# Patient Record
Sex: Female | Born: 1935 | Race: White | Hispanic: No | Marital: Married | Smoking: Never smoker
Health system: Southern US, Community
[De-identification: ages and names within clinical notes are randomized; demographics above are authoritative.]

## PROBLEM LIST (undated history)

## (undated) DIAGNOSIS — K219 Gastro-esophageal reflux disease without esophagitis: Secondary | ICD-10-CM

## (undated) DIAGNOSIS — H409 Unspecified glaucoma: Secondary | ICD-10-CM

## (undated) DIAGNOSIS — I509 Heart failure, unspecified: Secondary | ICD-10-CM

## (undated) DIAGNOSIS — F0280 Dementia in other diseases classified elsewhere without behavioral disturbance: Secondary | ICD-10-CM

## (undated) DIAGNOSIS — J90 Pleural effusion, not elsewhere classified: Secondary | ICD-10-CM

## (undated) DIAGNOSIS — G309 Alzheimer's disease, unspecified: Secondary | ICD-10-CM

## (undated) DIAGNOSIS — E079 Disorder of thyroid, unspecified: Secondary | ICD-10-CM

## (undated) DIAGNOSIS — E876 Hypokalemia: Secondary | ICD-10-CM

## (undated) DIAGNOSIS — F0390 Unspecified dementia without behavioral disturbance: Secondary | ICD-10-CM

## (undated) DIAGNOSIS — I10 Essential (primary) hypertension: Secondary | ICD-10-CM

## (undated) DIAGNOSIS — I2510 Atherosclerotic heart disease of native coronary artery without angina pectoris: Secondary | ICD-10-CM

## (undated) HISTORY — DX: Essential (primary) hypertension: I10

## (undated) HISTORY — PX: THYROID CYST EXCISION: SHX2511

## (undated) HISTORY — DX: Disorder of thyroid, unspecified: E07.9

## (undated) HISTORY — PX: ABDOMINAL HYSTERECTOMY: SHX81

## (undated) HISTORY — DX: Unspecified glaucoma: H40.9

---

## 1997-11-08 ENCOUNTER — Other Ambulatory Visit: Admission: RE | Admit: 1997-11-08 | Discharge: 1997-11-08 | Payer: Self-pay | Admitting: Gynecology

## 1998-02-22 ENCOUNTER — Inpatient Hospital Stay (HOSPITAL_COMMUNITY): Admission: RE | Admit: 1998-02-22 | Discharge: 1998-02-24 | Payer: Self-pay | Admitting: Gynecology

## 1998-11-08 ENCOUNTER — Other Ambulatory Visit: Admission: RE | Admit: 1998-11-08 | Discharge: 1998-11-08 | Payer: Self-pay | Admitting: Gynecology

## 2000-11-12 ENCOUNTER — Other Ambulatory Visit: Admission: RE | Admit: 2000-11-12 | Discharge: 2000-11-12 | Payer: Self-pay | Admitting: Gynecology

## 2000-11-27 ENCOUNTER — Encounter: Payer: Self-pay | Admitting: General Surgery

## 2000-11-27 ENCOUNTER — Encounter: Admission: RE | Admit: 2000-11-27 | Discharge: 2000-11-27 | Payer: Self-pay | Admitting: General Surgery

## 2001-12-02 ENCOUNTER — Other Ambulatory Visit: Admission: RE | Admit: 2001-12-02 | Discharge: 2001-12-02 | Payer: Self-pay | Admitting: Gynecology

## 2001-12-02 ENCOUNTER — Encounter: Admission: RE | Admit: 2001-12-02 | Discharge: 2001-12-02 | Payer: Self-pay | Admitting: Gynecology

## 2001-12-02 ENCOUNTER — Encounter: Payer: Self-pay | Admitting: Gynecology

## 2002-03-09 ENCOUNTER — Encounter (INDEPENDENT_AMBULATORY_CARE_PROVIDER_SITE_OTHER): Payer: Self-pay | Admitting: *Deleted

## 2002-03-09 ENCOUNTER — Ambulatory Visit (HOSPITAL_COMMUNITY): Admission: RE | Admit: 2002-03-09 | Discharge: 2002-03-09 | Payer: Self-pay | Admitting: Gastroenterology

## 2002-12-08 ENCOUNTER — Encounter: Admission: RE | Admit: 2002-12-08 | Discharge: 2002-12-08 | Payer: Self-pay | Admitting: Gynecology

## 2002-12-08 ENCOUNTER — Encounter: Payer: Self-pay | Admitting: Gynecology

## 2003-12-19 ENCOUNTER — Other Ambulatory Visit: Admission: RE | Admit: 2003-12-19 | Discharge: 2003-12-19 | Payer: Self-pay | Admitting: Gynecology

## 2003-12-19 ENCOUNTER — Encounter: Admission: RE | Admit: 2003-12-19 | Discharge: 2003-12-19 | Payer: Self-pay | Admitting: Gynecology

## 2004-12-19 ENCOUNTER — Encounter: Admission: RE | Admit: 2004-12-19 | Discharge: 2004-12-19 | Payer: Self-pay | Admitting: Gynecology

## 2005-12-25 ENCOUNTER — Encounter: Admission: RE | Admit: 2005-12-25 | Discharge: 2005-12-25 | Payer: Self-pay | Admitting: Gynecology

## 2005-12-25 ENCOUNTER — Other Ambulatory Visit: Admission: RE | Admit: 2005-12-25 | Discharge: 2005-12-25 | Payer: Self-pay | Admitting: Gynecology

## 2007-04-15 ENCOUNTER — Encounter: Admission: RE | Admit: 2007-04-15 | Discharge: 2007-04-15 | Payer: Self-pay | Admitting: Gynecology

## 2007-05-02 ENCOUNTER — Emergency Department (HOSPITAL_COMMUNITY): Admission: EM | Admit: 2007-05-02 | Discharge: 2007-05-02 | Payer: Self-pay | Admitting: Emergency Medicine

## 2008-05-12 ENCOUNTER — Encounter: Admission: RE | Admit: 2008-05-12 | Discharge: 2008-05-12 | Payer: Self-pay | Admitting: Gynecology

## 2009-05-29 ENCOUNTER — Encounter: Admission: RE | Admit: 2009-05-29 | Discharge: 2009-05-29 | Payer: Self-pay | Admitting: Gynecology

## 2009-12-08 ENCOUNTER — Emergency Department (HOSPITAL_COMMUNITY): Admission: EM | Admit: 2009-12-08 | Discharge: 2009-12-09 | Payer: Self-pay | Admitting: Emergency Medicine

## 2009-12-09 ENCOUNTER — Inpatient Hospital Stay (HOSPITAL_COMMUNITY): Admission: EM | Admit: 2009-12-09 | Discharge: 2009-12-18 | Payer: Self-pay | Admitting: Emergency Medicine

## 2009-12-14 ENCOUNTER — Encounter (INDEPENDENT_AMBULATORY_CARE_PROVIDER_SITE_OTHER): Payer: Self-pay | Admitting: Internal Medicine

## 2010-06-07 LAB — CBC
HCT: 34 % — ABNORMAL LOW (ref 36.0–46.0)
HCT: 36.5 % (ref 36.0–46.0)
HCT: 42.5 % (ref 36.0–46.0)
HCT: 43.3 % (ref 36.0–46.0)
Hemoglobin: 11.6 g/dL — ABNORMAL LOW (ref 12.0–15.0)
Hemoglobin: 11.7 g/dL — ABNORMAL LOW (ref 12.0–15.0)
Hemoglobin: 12.5 g/dL (ref 12.0–15.0)
MCH: 32.5 pg (ref 26.0–34.0)
MCH: 32.6 pg (ref 26.0–34.0)
MCH: 32.7 pg (ref 26.0–34.0)
MCH: 32.8 pg (ref 26.0–34.0)
MCHC: 33.8 g/dL (ref 30.0–36.0)
MCHC: 34.2 g/dL (ref 30.0–36.0)
MCHC: 34.3 g/dL (ref 30.0–36.0)
MCHC: 34.4 g/dL (ref 30.0–36.0)
MCHC: 34.5 g/dL (ref 30.0–36.0)
MCV: 95.3 fL (ref 78.0–100.0)
Platelets: 145 10*3/uL — ABNORMAL LOW (ref 150–400)
Platelets: 165 10*3/uL (ref 150–400)
Platelets: 178 10*3/uL (ref 150–400)
Platelets: 204 10*3/uL (ref 150–400)
Platelets: 212 10*3/uL (ref 150–400)
Platelets: 221 10*3/uL (ref 150–400)
RBC: 3.42 MIL/uL — ABNORMAL LOW (ref 3.87–5.11)
RBC: 3.46 MIL/uL — ABNORMAL LOW (ref 3.87–5.11)
RBC: 3.54 MIL/uL — ABNORMAL LOW (ref 3.87–5.11)
RBC: 3.62 MIL/uL — ABNORMAL LOW (ref 3.87–5.11)
RBC: 3.85 MIL/uL — ABNORMAL LOW (ref 3.87–5.11)
RBC: 4.42 MIL/uL (ref 3.87–5.11)
RBC: 4.92 MIL/uL (ref 3.87–5.11)
RDW: 12.7 % (ref 11.5–15.5)
RDW: 12.8 % (ref 11.5–15.5)
RDW: 13.2 % (ref 11.5–15.5)
WBC: 11.6 10*3/uL — ABNORMAL HIGH (ref 4.0–10.5)
WBC: 11.9 10*3/uL — ABNORMAL HIGH (ref 4.0–10.5)
WBC: 14 10*3/uL — ABNORMAL HIGH (ref 4.0–10.5)
WBC: 6.3 10*3/uL (ref 4.0–10.5)

## 2010-06-07 LAB — COMPREHENSIVE METABOLIC PANEL
ALT: 104 U/L — ABNORMAL HIGH (ref 0–35)
ALT: 189 U/L — ABNORMAL HIGH (ref 0–35)
ALT: 303 U/L — ABNORMAL HIGH (ref 0–35)
ALT: 53 U/L — ABNORMAL HIGH (ref 0–35)
AST: 159 U/L — ABNORMAL HIGH (ref 0–37)
AST: 23 U/L (ref 0–37)
AST: 411 U/L — ABNORMAL HIGH (ref 0–37)
AST: 58 U/L — ABNORMAL HIGH (ref 0–37)
Albumin: 2.4 g/dL — ABNORMAL LOW (ref 3.5–5.2)
Albumin: 2.6 g/dL — ABNORMAL LOW (ref 3.5–5.2)
Albumin: 3.8 g/dL (ref 3.5–5.2)
Alkaline Phosphatase: 59 U/L (ref 39–117)
Alkaline Phosphatase: 61 U/L (ref 39–117)
BUN: 3 mg/dL — ABNORMAL LOW (ref 6–23)
CO2: 23 mEq/L (ref 19–32)
CO2: 25 mEq/L (ref 19–32)
CO2: 25 mEq/L (ref 19–32)
CO2: 26 mEq/L (ref 19–32)
CO2: 27 mEq/L (ref 19–32)
Calcium: 7.6 mg/dL — ABNORMAL LOW (ref 8.4–10.5)
Calcium: 7.8 mg/dL — ABNORMAL LOW (ref 8.4–10.5)
Chloride: 103 mEq/L (ref 96–112)
Chloride: 107 mEq/L (ref 96–112)
Chloride: 112 mEq/L (ref 96–112)
Chloride: 113 mEq/L — ABNORMAL HIGH (ref 96–112)
Chloride: 116 mEq/L — ABNORMAL HIGH (ref 96–112)
Creatinine, Ser: 0.76 mg/dL (ref 0.4–1.2)
Creatinine, Ser: 0.78 mg/dL (ref 0.4–1.2)
Creatinine, Ser: 0.93 mg/dL (ref 0.4–1.2)
GFR calc Af Amer: >60 mL/min (ref >60)
GFR calc Af Amer: >60 mL/min (ref >60)
GFR calc Af Amer: >60 mL/min (ref >60)
GFR calc non Af Amer: 59 mL/min — ABNORMAL LOW (ref >60)
GFR calc non Af Amer: 59 mL/min — ABNORMAL LOW (ref >60)
GFR calc non Af Amer: >60 mL/min (ref >60)
GFR calc non Af Amer: >60 mL/min (ref >60)
GFR calc non Af Amer: >60 mL/min (ref >60)
Glucose, Bld: 102 mg/dL — ABNORMAL HIGH (ref 70–99)
Glucose, Bld: 126 mg/dL — ABNORMAL HIGH (ref 70–99)
Glucose, Bld: 182 mg/dL — ABNORMAL HIGH (ref 70–99)
Potassium: 3.9 mEq/L (ref 3.5–5.1)
Sodium: 136 mEq/L (ref 135–145)
Sodium: 138 mEq/L (ref 135–145)
Sodium: 142 mEq/L (ref 135–145)
Sodium: 142 mEq/L (ref 135–145)
Total Bilirubin: 0.3 mg/dL (ref 0.3–1.2)
Total Bilirubin: 0.5 mg/dL (ref 0.3–1.2)
Total Bilirubin: 0.9 mg/dL (ref 0.3–1.2)
Total Bilirubin: 1.3 mg/dL — ABNORMAL HIGH (ref 0.3–1.2)
Total Bilirubin: 1.9 mg/dL — ABNORMAL HIGH (ref 0.3–1.2)

## 2010-06-07 LAB — DIFFERENTIAL
Basophils Absolute: 0 10*3/uL (ref 0.0–0.1)
Basophils Absolute: 0 10*3/uL (ref 0.0–0.1)
Eosinophils Absolute: 0 10*3/uL (ref 0.0–0.7)
Eosinophils Absolute: 0 10*3/uL (ref 0.0–0.7)
Eosinophils Absolute: 0.1 10*3/uL (ref 0.0–0.7)
Eosinophils Relative: 0 % (ref 0–5)
Eosinophils Relative: 0 % (ref 0–5)
Eosinophils Relative: 1 % (ref 0–5)
Lymphocytes Relative: 14 % (ref 12–46)
Lymphocytes Relative: 6 % — ABNORMAL LOW (ref 12–46)
Lymphs Abs: 0.7 10*3/uL (ref 0.7–4.0)
Lymphs Abs: 1.1 10*3/uL (ref 0.7–4.0)
Lymphs Abs: 1.9 10*3/uL (ref 0.7–4.0)
Monocytes Relative: 5 % (ref 3–12)
Neutro Abs: 10.3 10*3/uL — ABNORMAL HIGH (ref 1.7–7.7)
Neutro Abs: 11.3 10*3/uL — ABNORMAL HIGH (ref 1.7–7.7)
Neutro Abs: 17.3 10*3/uL — ABNORMAL HIGH (ref 1.7–7.7)
Neutrophils Relative %: 84 % — ABNORMAL HIGH (ref 43–77)
Neutrophils Relative %: 87 % — ABNORMAL HIGH (ref 43–77)
Neutrophils Relative %: 89 % — ABNORMAL HIGH (ref 43–77)

## 2010-06-07 LAB — GLUCOSE, CAPILLARY: Glucose-Capillary: 148 mg/dL — ABNORMAL HIGH (ref 70–99)

## 2010-06-07 LAB — BASIC METABOLIC PANEL
CO2: 28 mEq/L (ref 19–32)
Calcium: 7.9 mg/dL — ABNORMAL LOW (ref 8.4–10.5)
Calcium: 8.1 mg/dL — ABNORMAL LOW (ref 8.4–10.5)
Chloride: 111 mEq/L (ref 96–112)
Creatinine, Ser: 0.69 mg/dL (ref 0.4–1.2)
Creatinine, Ser: 0.71 mg/dL (ref 0.4–1.2)
GFR calc Af Amer: >60 mL/min (ref >60)
GFR calc Af Amer: >60 mL/min (ref >60)
GFR calc Af Amer: >60 mL/min (ref >60)
GFR calc non Af Amer: >60 mL/min (ref >60)
Glucose, Bld: 160 mg/dL — ABNORMAL HIGH (ref 70–99)
Potassium: 3.1 mEq/L — ABNORMAL LOW (ref 3.5–5.1)

## 2010-06-07 LAB — TYPE AND SCREEN
ABO/RH(D): O POS
Antibody Screen: NEGATIVE

## 2010-06-07 LAB — MAGNESIUM: Magnesium: 2.1 mg/dL (ref 1.5–2.5)

## 2010-06-07 LAB — LACTIC ACID, PLASMA: Lactic Acid, Venous: 4.5 mmol/L — ABNORMAL HIGH (ref 0.5–2.2)

## 2010-06-07 LAB — STOOL CULTURE

## 2010-06-07 LAB — CULTURE, BLOOD (ROUTINE X 2)
Culture  Setup Time: 201109180054
Culture: NO GROWTH

## 2010-06-07 LAB — OVA AND PARASITE EXAMINATION: Ova and parasites: NONE SEEN

## 2010-06-07 LAB — HEPATITIS PANEL, ACUTE
HCV Ab: NEGATIVE
Hep A IgM: NEGATIVE
Hepatitis B Surface Ag: NEGATIVE

## 2010-06-07 LAB — FECAL LACTOFERRIN, QUANT: Fecal Lactoferrin: POSITIVE

## 2010-06-07 LAB — URINALYSIS, ROUTINE W REFLEX MICROSCOPIC
Bilirubin Urine: NEGATIVE
Glucose, UA: NEGATIVE mg/dL
Urobilinogen, UA: 0.2 mg/dL (ref 0.0–1.0)

## 2010-06-07 LAB — PREALBUMIN: Prealbumin: 13.1 mg/dL — ABNORMAL LOW (ref 18.0–45.0)

## 2010-06-07 LAB — HEPATIC FUNCTION PANEL
ALT: 237 U/L — ABNORMAL HIGH (ref 0–35)
AST: 265 U/L — ABNORMAL HIGH (ref 0–37)
Total Protein: 5.4 g/dL — ABNORMAL LOW (ref 6.0–8.3)

## 2010-07-26 ENCOUNTER — Other Ambulatory Visit: Payer: Self-pay | Admitting: Family

## 2010-07-26 DIAGNOSIS — Z1231 Encounter for screening mammogram for malignant neoplasm of breast: Secondary | ICD-10-CM

## 2010-08-10 ENCOUNTER — Ambulatory Visit: Payer: Self-pay

## 2010-08-10 NOTE — Op Note (Signed)
NAME:  Jordan Horne, Jordan Horne                       ACCOUNT NO.:  [id number]   MEDICAL RECORD NO.:  [id number]                   PATIENT TYPE:  AMB   LOCATION:  ENDO                                 FACILITY:  [facility]   PHYSICIAN:  Petra Kuba, M.D.                 DATE OF BIRTH:  07/13/1932   DATE OF PROCEDURE:  03/09/2002  DATE OF DISCHARGE:                                 OPERATIVE REPORT   PROCEDURE:  Colonoscopy with biopsy.   INDICATIONS:  Bright red blood per rectum, guaiac positivity.  Due for  colonic screening.  Consent was signed after risks, benefits, methods, and  options thoroughly discussed in the office.   MEDICATIONS:  Demerol 60 mg, Versed 6 mg.   DESCRIPTION OF PROCEDURE:  Rectal inspection was pertinent for external  hemorrhoids, small.  Digital exam was negative.  The video pediatric  adjustable colonoscope was inserted, easily advanced around the colon to the  cecum.  This did require abdominal pressure.  Unfortunately, with falling  back around the ascending we did have to roll her on her back, but not on  the initial attempt.  The cecum was identified by the appendiceal orifice  and the ileocecal valve.  No obvious abnormality was seen on insertion.  In  the cecum a tiny 1-2 mm polyp was seen and was cold biopsied x3, and the  scope was further withdrawn.  She did have a tortuous looping colon and when  we fell back around a loop, we did readvance around the curves to decrease  chances of missing things but on slow withdrawal back to the rectum, with  both changing her position occasionally to readvance and abdominal pressure,  no other abnormalities were seen.  Specifically, no other polyps, tumors,  masses, signs of bleeding or diverticular.  Once back in the rectum the  scope was retroflexed, revealing some small internal hemorrhoids with some  small tears.  The scope was straightened and readvanced a short way up the  left side of the colon, air was  suctioned, the scope removed.  The patient  tolerated the procedure well.  There was no obvious immediate complication.   ENDOSCOPIC DIAGNOSES:  1. Internal-external small hemorrhoids with internal tears.  2. Tiny cecal polyp, cold biopsied.  3. Otherwise within normal limits to the cecum.    PLAN:  Await pathology to determine future colonic screening.  Follow up  with me p.r.n. or in two months to recheck guaiacs while we are treating her  hemorrhoids, recheck symptoms, possibly even a CBC, and make sure no further  workup plans are needed.                                               Petra Kuba, M.D.  MEM/MEDQ  D:  03/09/2002  T:  03/09/2002  Job:  [id number]   cc:   Veverly Fells. Altheimer, M.D.  [address]  [place]  [place] [address]  Fax: [phone]   Gretta Cool, M.D.  [address] [place]  [place] [address]  Fax: [phone]

## 2010-08-22 ENCOUNTER — Ambulatory Visit
Admission: RE | Admit: 2010-08-22 | Discharge: 2010-08-22 | Disposition: A | Discharge status: Home / Self Care | Payer: Medicare Other | Source: Ambulatory Visit | Attending: Family | Admitting: Family

## 2010-08-22 DIAGNOSIS — Z1231 Encounter for screening mammogram for malignant neoplasm of breast: Secondary | ICD-10-CM

## 2010-12-14 LAB — BASIC METABOLIC PANEL
GFR calc Af Amer: >60
GFR calc non Af Amer: >60
Glucose, Bld: 134 — ABNORMAL HIGH
Potassium: 3.5
Sodium: 141

## 2010-12-14 LAB — URINALYSIS, ROUTINE W REFLEX MICROSCOPIC
Bilirubin Urine: NEGATIVE
Nitrite: NEGATIVE
Protein, ur: 100 — AB
Urobilinogen, UA: 0.2

## 2010-12-14 LAB — DIFFERENTIAL
Eosinophils Relative: 0
Lymphocytes Relative: 11 — ABNORMAL LOW
Lymphs Abs: 0.9
Monocytes Absolute: 0.2
Monocytes Relative: 2 — ABNORMAL LOW

## 2010-12-14 LAB — URINE MICROSCOPIC-ADD ON

## 2010-12-14 LAB — CBC
HCT: 39.9
Hemoglobin: 13.7
RBC: 4.28
RDW: 12.8
WBC: 8

## 2010-12-14 LAB — POCT CARDIAC MARKERS
CKMB, poc: 1.2
CKMB, poc: 1.7
Myoglobin, poc: 57.6
Myoglobin, poc: 73.6
Operator id: 4001
Troponin i, poc: <0.05

## 2011-07-29 ENCOUNTER — Other Ambulatory Visit: Payer: Self-pay | Admitting: Gynecology

## 2011-07-29 DIAGNOSIS — Z1231 Encounter for screening mammogram for malignant neoplasm of breast: Secondary | ICD-10-CM

## 2011-08-30 ENCOUNTER — Ambulatory Visit: Payer: PRIVATE HEALTH INSURANCE

## 2011-10-17 ENCOUNTER — Ambulatory Visit
Admission: RE | Admit: 2011-10-17 | Discharge: 2011-10-17 | Disposition: A | Discharge status: Home / Self Care | Payer: Medicare Other | Source: Ambulatory Visit | Attending: Gynecology | Admitting: Gynecology

## 2011-10-17 ENCOUNTER — Other Ambulatory Visit: Payer: Self-pay | Admitting: Family

## 2011-10-17 DIAGNOSIS — Z1231 Encounter for screening mammogram for malignant neoplasm of breast: Secondary | ICD-10-CM

## 2013-09-29 ENCOUNTER — Encounter: Payer: Self-pay | Admitting: Cardiology

## 2013-09-29 ENCOUNTER — Ambulatory Visit (INDEPENDENT_AMBULATORY_CARE_PROVIDER_SITE_OTHER): Payer: Medicare Other | Admitting: Cardiology

## 2013-09-29 ENCOUNTER — Ambulatory Visit
Admission: RE | Admit: 2013-09-29 | Discharge: 2013-09-29 | Disposition: A | Discharge status: Home / Self Care | Payer: Medicare Other | Source: Ambulatory Visit | Attending: Cardiology | Admitting: Cardiology

## 2013-09-29 VITALS — BP 122/81 | HR 70 | Ht 63.0 in | Wt 159.0 lb

## 2013-09-29 DIAGNOSIS — I4510 Unspecified right bundle-branch block: Secondary | ICD-10-CM

## 2013-09-29 DIAGNOSIS — R0789 Other chest pain: Principal | ICD-10-CM

## 2013-09-29 DIAGNOSIS — E039 Hypothyroidism, unspecified: Secondary | ICD-10-CM

## 2013-09-29 DIAGNOSIS — E7800 Pure hypercholesterolemia, unspecified: Secondary | ICD-10-CM

## 2013-09-29 DIAGNOSIS — R079 Chest pain, unspecified: Secondary | ICD-10-CM

## 2013-09-29 HISTORY — DX: Hypothyroidism, unspecified: E03.9

## 2013-09-29 HISTORY — DX: Pure hypercholesterolemia, unspecified: E78.00

## 2013-09-29 HISTORY — DX: Chest pain, unspecified: R07.9

## 2013-09-29 NOTE — Patient Instructions (Signed)
Your physician recommends that you continue on your current medications as directed. Please refer to the Current Medication list given to you today.  Follow up as needed  A chest x-ray takes a picture of the organs and structures inside the chest, including the heart, lungs, and blood vessels. This test can show several things, including, whether the heart is enlarges; whether fluid is building up in the lungs; and whether pacemaker / defibrillator leads are still in place. [facility]  Your physician has requested that you have a lexiscan myoview. For further information please visit [email or web address]. Please follow instruction sheet, as given.

## 2013-09-29 NOTE — Progress Notes (Signed)
Jordan SizerBobbie R Horne Date of Birth:  1932/09/19 [facility] [address] [place], [place]  [address] [phone]        Fax   [phone]   History of Present Illness: This pleasant 80 year old woman is seen in our office for the first time today.  She is seen at the request of Mauricio Poegina York NP.  The patient had a recent annual physical at which time an electrocardiogram was done.  It showed a new right bundle branch block.  She had had a previous EKG on 10/09/10 which had not shown a right bundle branch block.  The patient has had occasional chest discomfort.  It is a substernal discomfort without radiation.  At times it is located higher up in the chest and seems to radiate toward both shoulders.  She has not experienced any left arm radiation.  She has had increased fatigue.  She has noted occasional mild peripheral edema.  She has been experiencing occasional dizzy spells.  She has not had syncope. Her last chest x-ray in our computer was in 2009 and at that time her heart size was at the upper limit of normal Her family history reveals that her father died at age 78 of a heart attack.  He was also a heavy user of alcohol and tobacco.  3 of her paternal uncles have also died of heart attacks.  Her mother on the other hand is alive at age 78 although has heart failure. The patient is a nonsmoker.  The patient has a history of hypercholesterolemia and is on simvastatin.  She also has a history of hypothyroidism and is on low-dose Synthroid. Current Outpatient Prescriptions  Medication Sig Dispense Refill  . diazepam (VALIUM) 5 MG tablet Take 5 mg by mouth every 6 (six) hours as needed.       . latanoprost (XALATAN) 0.005 % ophthalmic solution Place 1 drop into the left eye at bedtime.       . pantoprazole (PROTONIX) 40 MG tablet Take 40 mg by mouth daily.       . simvastatin (ZOCOR) 20 MG tablet Take 20 mg by mouth daily at 6 PM.       . SYNTHROID 88 MCG tablet 88 mcg daily  before breakfast.       . HYDROcodone-acetaminophen (NORCO/VICODIN) 5-325 MG per tablet        No current facility-administered medications for this visit.    Allergies  Allergen Reactions  . Betadine [Povidone Iodine] Itching    Patient Active Problem List   Diagnosis Date Noted  . Chest pain of uncertain etiology 09/29/2013  . Right bundle branch block 09/29/2013  . Hypothyroidism 09/29/2013  . Hypercholesterolemia 09/29/2013    History  Smoking status  . Never Smoker   Smokeless tobacco  . Not on file    History  Alcohol Use  . 0.6 oz/week  . 1 Glasses of wine per week    Comment: just occasional    Family History  Problem Relation Age of Onset  . Arthritis Mother   . Heart failure Mother   . Heart attack Father   . Cancer Brother     Review of Systems: Constitutional: no fever chills diaphoresis or fatigue or change in weight.  Head and neck: no hearing loss, no epistaxis, no photophobia or visual disturbance. Respiratory: No cough, shortness of breath or wheezing. Cardiovascular: No chest pain peripheral edema, palpitations. Gastrointestinal: No abdominal distention, no abdominal pain, no change in  bowel habits hematochezia or melena. Genitourinary: No dysuria, no frequency, no urgency, no nocturia. Musculoskeletal:No arthralgias, no back pain, no gait disturbance or myalgias. Neurological: No dizziness, no headaches, no numbness, no seizures, no syncope, no weakness, no tremors. Hematologic: No lymphadenopathy, no easy bruising. Psychiatric: No confusion, no hallucinations, no sleep disturbance.    Physical Exam: Filed Vitals:   09/29/13 1412  BP: 122/81  Pulse: 70   the general appearance reveals a well-developed well-nourished elderly woman in no distress.The head and neck exam reveals pupils equal and reactive.  Extraocular movements are full.  There is no scleral icterus.  The mouth and pharynx are normal.  The neck is supple.  The carotids reveal  no bruits.  The jugular venous pressure is normal.  The  thyroid is not enlarged.  There is no lymphadenopathy.  The chest is clear to percussion and auscultation.  There are no rales or rhonchi.  Expansion of the chest is symmetrical.  The precordium is quiet.  The first heart sound is normal.  The second heart sound is physiologically split.  There is no murmur gallop rub or click.  There is no abnormal lift or heave.  The abdomen is soft and nontender.  The bowel sounds are normal.  The liver and spleen are not enlarged.  There are no abdominal masses.  There are no abdominal bruits.  Extremities reveal good pedal pulses.  There is no phlebitis or edema.  There is no cyanosis or clubbing.  Strength is normal and symmetrical in all extremities.  There is no lateralizing weakness.  There are no sensory deficits.  The skin is warm and dry.  There is no rash.  EKG done in the office today shows normal sinus rhythm with left axis deviation and right bundle branch block.  There are Q waves in the inferior leads suggesting possible inferior wall myocardial infarction uncertain age.   Assessment / Plan:  1. atypical chest pain 2. right bundle branch block new since 2012 3. Hypercholesterolemia 4. family history of premature coronary disease  Plan: No new medications were prescribed.  We will obtain a chest x-ray to look at heart size.  We will have her return for a Lexi scan Myoview stress test. Many thanks for the opportunity to see this pleasant woman with you.  We will be in touch with you regarding the results of her studies.

## 2013-10-06 ENCOUNTER — Ambulatory Visit (HOSPITAL_COMMUNITY): Payer: Medicare Other | Attending: Cardiovascular Disease | Admitting: Radiology

## 2013-10-06 VITALS — BP 132/71 | Ht 63.0 in | Wt 158.0 lb

## 2013-10-06 DIAGNOSIS — I2510 Atherosclerotic heart disease of native coronary artery without angina pectoris: Secondary | ICD-10-CM | POA: Diagnosis present

## 2013-10-06 DIAGNOSIS — R5383 Other fatigue: Secondary | ICD-10-CM | POA: Diagnosis not present

## 2013-10-06 DIAGNOSIS — I4510 Unspecified right bundle-branch block: Secondary | ICD-10-CM | POA: Diagnosis not present

## 2013-10-06 DIAGNOSIS — Z8249 Family history of ischemic heart disease and other diseases of the circulatory system: Secondary | ICD-10-CM | POA: Diagnosis not present

## 2013-10-06 DIAGNOSIS — R42 Dizziness and giddiness: Secondary | ICD-10-CM | POA: Insufficient documentation

## 2013-10-06 DIAGNOSIS — R5381 Other malaise: Secondary | ICD-10-CM | POA: Diagnosis not present

## 2013-10-06 DIAGNOSIS — R079 Chest pain, unspecified: Secondary | ICD-10-CM | POA: Diagnosis not present

## 2013-10-06 DIAGNOSIS — R0789 Other chest pain: Secondary | ICD-10-CM

## 2013-10-06 MED ORDER — TECHNETIUM TC 99M SESTAMIBI GENERIC - CARDIOLITE
10.0000 | Freq: Once | INTRAVENOUS | Status: AC | PRN
  Administered 2013-10-06: 10 via INTRAVENOUS

## 2013-10-06 MED ORDER — REGADENOSON 0.4 MG/5ML IV SOLN
0.4000 mg | Freq: Once | INTRAVENOUS | Status: AC
  Administered 2013-10-06: 0.4 mg via INTRAVENOUS

## 2013-10-06 MED ORDER — TECHNETIUM TC 99M SESTAMIBI GENERIC - CARDIOLITE
30.0000 | Freq: Once | INTRAVENOUS | Status: AC | PRN
  Administered 2013-10-06: 30 via INTRAVENOUS

## 2013-10-06 NOTE — Progress Notes (Signed)
[facility] SITE 3 NUCLEAR MED [address] [place], [place] [address] [phone]    Cardiology Nuclear Med Study  Jordan SizerBobbie R Horne is a 80 y.o. female     MRN : [id number]     DOB: [birth year]  Procedure Date: 10/06/2013  Nuclear Med Background Indication for Stress Test:  Evaluation for Ischemia and Abnormal EKG (RBBB) History:  No H/O CAD Cardiac Risk Factors: CVA (R Eye), Family History - CAD and Lipids  Symptoms:  Chest Pain, Dizziness and Fatigue   Nuclear Pre-Procedure Caffeine/Decaff Intake:  None> 12 hrs NPO After: 5:00pm   Lungs:  clear O2 Sat: 96% on room air. IV 0.9% NS with Angio Cath:  20g  IV Site: R Wrist x 1, tolerated well IV Started by:  Jordan HongPatsy Edwards, RN  Chest Size (in):  40 Cup Size: D  Height: 5\' 3"  (1.6 m)  Weight:  158 lb (71.668 kg)  BMI:  Body mass index is 28 kg/(m^2). Tech Comments:  Patient took AM medications and Valium 5 mg at 0630 per patient. Jordan HongPatsy Edwards, RN.    Nuclear Med Study 1 or 2 day study: 1 day  Stress Test Type:  Jordan BirksLexiscan  Reading MD: N/A  Order Authorizing Provider:  Cassell Clementhomas Brackbill, MD  Resting Radionuclide: Technetium 6380m Sestamibi  Resting Radionuclide Dose: 11.0 mCi   Stress Radionuclide:  Technetium 880m Sestamibi  Stress Radionuclide Dose: 33.0 mCi           Stress Protocol Rest HR: 63 Stress HR: 92  Rest BP: 132/71 Stress BP: 138/76  Exercise Time (min): n/a METS: n/a   Predicted Max HR: 141 bpm % Max HR: 65.25 bpm Rate Pressure Product: [id number]   Dose of Adenosine (mg):  n/a Dose of Lexiscan: 0.4 mg  Dose of Atropine (mg): n/a Dose of Dobutamine: n/a mcg/kg/min (at max HR)  Stress Test Technologist: Jordan Horne, EMT-P  Nuclear Technologist:  Jordan Horne, CNMT     Rest Procedure:  Myocardial perfusion imaging was performed at rest 45 minutes following the intravenous administration of Technetium 980m Sestamibi. Rest ECG: NSR-RBBB  Stress Procedure:  The patient received IV Lexiscan 0.4 mg  over 15-seconds.  Technetium 4380m Sestamibi injected at 30-seconds.This patient had sob, chest pressure, nausea, and a headache with the Lexiscan injection.  Quantitative spect images were obtained after a 45 minute delay. Stress ECG: No significant change from baseline ECG  QPS Raw Data Images:  Normal normal heart/lung ratio.  There is some motion present.  Stress Images:  Normal homogeneous uptake in all areas of the myocardium. Rest Images:  Normal homogeneous uptake in all areas of the myocardium. Subtraction (SDS):  No evidence of ischemia. Transient Ischemic Dilatation (Normal <1.22):  1.01 Lung/Heart Ratio (Normal <0.45):  .34  Quantitative Gated Spect Images QGS EDV:  63 ml QGS ESV:  17 ml  Impression Exercise Capacity:  Lexiscan with no exercise. BP Response:  Normal blood pressure response. Clinical Symptoms:  No significant symptoms noted. ECG Impression:  No significant ST segment change suggestive of ischemia. Comparison with Prior Nuclear Study: No previous nuclear study performed  Overall Impression:  Normal stress nuclear study.  LV Ejection Fraction: 74%.  LV Wall Motion:  NL LV Function; NL Wall Motion.   [name], Montez HagemanJr., MD, [facility] Group[facility]ACC 10/06/2013, 4:46 PM [address],  [address] Office [phone] Pager [phone]

## 2014-01-19 ENCOUNTER — Other Ambulatory Visit: Payer: Self-pay | Admitting: Family

## 2014-01-19 ENCOUNTER — Other Ambulatory Visit: Payer: Self-pay

## 2014-01-19 DIAGNOSIS — R928 Other abnormal and inconclusive findings on diagnostic imaging of breast: Secondary | ICD-10-CM

## 2014-01-31 ENCOUNTER — Ambulatory Visit
Admission: RE | Admit: 2014-01-31 | Discharge: 2014-01-31 | Disposition: A | Discharge status: Home / Self Care | Payer: Medicare Other | Source: Ambulatory Visit | Attending: Family | Admitting: Family

## 2014-01-31 ENCOUNTER — Encounter (INDEPENDENT_AMBULATORY_CARE_PROVIDER_SITE_OTHER): Payer: Self-pay

## 2014-01-31 DIAGNOSIS — R928 Other abnormal and inconclusive findings on diagnostic imaging of breast: Secondary | ICD-10-CM

## 2014-09-05 ENCOUNTER — Encounter: Payer: Self-pay | Admitting: Cardiology

## 2014-12-23 ENCOUNTER — Other Ambulatory Visit: Payer: Self-pay

## 2014-12-23 DIAGNOSIS — Z1231 Encounter for screening mammogram for malignant neoplasm of breast: Secondary | ICD-10-CM

## 2014-12-30 ENCOUNTER — Ambulatory Visit
Admission: RE | Admit: 2014-12-30 | Discharge: 2014-12-30 | Disposition: A | Discharge status: Home / Self Care | Payer: Medicare Other | Source: Ambulatory Visit

## 2014-12-30 ENCOUNTER — Ambulatory Visit: Payer: Medicare Other

## 2014-12-30 DIAGNOSIS — Z1231 Encounter for screening mammogram for malignant neoplasm of breast: Secondary | ICD-10-CM

## 2016-02-02 ENCOUNTER — Other Ambulatory Visit: Payer: Self-pay | Admitting: Family

## 2016-02-02 DIAGNOSIS — Z1231 Encounter for screening mammogram for malignant neoplasm of breast: Secondary | ICD-10-CM

## 2016-03-26 ENCOUNTER — Ambulatory Visit
Admission: RE | Admit: 2016-03-26 | Discharge: 2016-03-26 | Disposition: A | Discharge status: Home / Self Care | Payer: Medicare Other | Source: Ambulatory Visit | Attending: Family | Admitting: Family

## 2016-03-26 DIAGNOSIS — Z1231 Encounter for screening mammogram for malignant neoplasm of breast: Secondary | ICD-10-CM

## 2018-03-23 ENCOUNTER — Emergency Department (HOSPITAL_COMMUNITY): Payer: Medicare Other

## 2018-03-23 ENCOUNTER — Encounter (HOSPITAL_COMMUNITY): Payer: Self-pay

## 2018-03-23 ENCOUNTER — Inpatient Hospital Stay (HOSPITAL_COMMUNITY)
Admission: EM | Admit: 2018-03-23 | Discharge: 2018-03-31 | DRG: 388 | Disposition: A | Discharge status: Skilled Nursing Facility | Payer: Medicare Other | Attending: Internal Medicine | Admitting: Internal Medicine

## 2018-03-23 DIAGNOSIS — E7800 Pure hypercholesterolemia, unspecified: Secondary | ICD-10-CM | POA: Diagnosis present

## 2018-03-23 DIAGNOSIS — K56609 Unspecified intestinal obstruction, unspecified as to partial versus complete obstruction: Secondary | ICD-10-CM | POA: Diagnosis present

## 2018-03-23 DIAGNOSIS — R7989 Other specified abnormal findings of blood chemistry: Secondary | ICD-10-CM

## 2018-03-23 DIAGNOSIS — R509 Fever, unspecified: Secondary | ICD-10-CM

## 2018-03-23 DIAGNOSIS — R739 Hyperglycemia, unspecified: Secondary | ICD-10-CM | POA: Diagnosis present

## 2018-03-23 DIAGNOSIS — K562 Volvulus: Principal | ICD-10-CM

## 2018-03-23 DIAGNOSIS — I700 Atherosclerosis of aorta: Secondary | ICD-10-CM | POA: Diagnosis present

## 2018-03-23 DIAGNOSIS — R079 Chest pain, unspecified: Secondary | ICD-10-CM

## 2018-03-23 DIAGNOSIS — I110 Hypertensive heart disease with heart failure: Secondary | ICD-10-CM | POA: Diagnosis present

## 2018-03-23 DIAGNOSIS — E785 Hyperlipidemia, unspecified: Secondary | ICD-10-CM | POA: Diagnosis present

## 2018-03-23 DIAGNOSIS — E872 Acidosis: Secondary | ICD-10-CM | POA: Diagnosis present

## 2018-03-23 DIAGNOSIS — I5033 Acute on chronic diastolic (congestive) heart failure: Secondary | ICD-10-CM

## 2018-03-23 DIAGNOSIS — B974 Respiratory syncytial virus as the cause of diseases classified elsewhere: Secondary | ICD-10-CM | POA: Diagnosis present

## 2018-03-23 DIAGNOSIS — R0602 Shortness of breath: Secondary | ICD-10-CM

## 2018-03-23 DIAGNOSIS — J9601 Acute respiratory failure with hypoxia: Secondary | ICD-10-CM | POA: Diagnosis present

## 2018-03-23 DIAGNOSIS — E890 Postprocedural hypothyroidism: Secondary | ICD-10-CM | POA: Diagnosis present

## 2018-03-23 DIAGNOSIS — R05 Cough: Secondary | ICD-10-CM

## 2018-03-23 DIAGNOSIS — E039 Hypothyroidism, unspecified: Secondary | ICD-10-CM | POA: Diagnosis present

## 2018-03-23 DIAGNOSIS — Z90710 Acquired absence of both cervix and uterus: Secondary | ICD-10-CM

## 2018-03-23 DIAGNOSIS — Z79890 Hormone replacement therapy (postmenopausal): Secondary | ICD-10-CM

## 2018-03-23 DIAGNOSIS — J441 Chronic obstructive pulmonary disease with (acute) exacerbation: Secondary | ICD-10-CM | POA: Diagnosis present

## 2018-03-23 DIAGNOSIS — J440 Chronic obstructive pulmonary disease with acute lower respiratory infection: Secondary | ICD-10-CM | POA: Diagnosis present

## 2018-03-23 DIAGNOSIS — K219 Gastro-esophageal reflux disease without esophagitis: Secondary | ICD-10-CM | POA: Diagnosis present

## 2018-03-23 DIAGNOSIS — Z8249 Family history of ischemic heart disease and other diseases of the circulatory system: Secondary | ICD-10-CM

## 2018-03-23 DIAGNOSIS — Z79899 Other long term (current) drug therapy: Secondary | ICD-10-CM

## 2018-03-23 DIAGNOSIS — E876 Hypokalemia: Secondary | ICD-10-CM | POA: Diagnosis present

## 2018-03-23 DIAGNOSIS — R059 Cough, unspecified: Secondary | ICD-10-CM

## 2018-03-23 DIAGNOSIS — R945 Abnormal results of liver function studies: Secondary | ICD-10-CM

## 2018-03-23 DIAGNOSIS — H409 Unspecified glaucoma: Secondary | ICD-10-CM | POA: Diagnosis present

## 2018-03-23 DIAGNOSIS — J189 Pneumonia, unspecified organism: Secondary | ICD-10-CM | POA: Diagnosis present

## 2018-03-23 DIAGNOSIS — Z9049 Acquired absence of other specified parts of digestive tract: Secondary | ICD-10-CM

## 2018-03-23 LAB — COMPREHENSIVE METABOLIC PANEL
ALT: 23 U/L (ref 0–44)
AST: 37 U/L (ref 15–41)
Albumin: 3.6 g/dL (ref 3.5–5.0)
Alkaline Phosphatase: 53 U/L (ref 38–126)
Anion gap: 13 (ref 5–15)
BUN: 8 mg/dL (ref 8–23)
CO2: 23 mmol/L (ref 22–32)
Calcium: 8.9 mg/dL (ref 8.9–10.3)
Chloride: 101 mmol/L (ref 98–111)
Creatinine, Ser: 0.82 mg/dL (ref 0.44–1.00)
GFR calc non Af Amer: >60 mL/min (ref >60)
Glucose, Bld: 150 mg/dL — ABNORMAL HIGH (ref 70–99)
Potassium: 3.3 mmol/L — ABNORMAL LOW (ref 3.5–5.1)
Sodium: 137 mmol/L (ref 135–145)
Total Bilirubin: 1.3 mg/dL — ABNORMAL HIGH (ref 0.3–1.2)
Total Protein: 7.1 g/dL (ref 6.5–8.1)

## 2018-03-23 LAB — CBC
HCT: 41.7 % (ref 36.0–46.0)
Hemoglobin: 13.8 g/dL (ref 12.0–15.0)
MCH: 30.9 pg (ref 26.0–34.0)
MCHC: 33.1 g/dL (ref 30.0–36.0)
MCV: 93.5 fL (ref 80.0–100.0)
Platelets: 155 10*3/uL (ref 150–400)
RBC: 4.46 MIL/uL (ref 3.87–5.11)
RDW: 12.6 % (ref 11.5–15.5)
WBC: 9.5 10*3/uL (ref 4.0–10.5)
nRBC: 0 % (ref 0.0–0.2)

## 2018-03-23 LAB — LIPASE, BLOOD: LIPASE: 19 U/L (ref 11–51)

## 2018-03-23 MED ORDER — ONDANSETRON HCL 4 MG/2ML IJ SOLN
4.0000 mg | Freq: Once | INTRAMUSCULAR | Status: AC
  Administered 2018-03-24: 4 mg via INTRAVENOUS
  Filled 2018-03-23: qty 2

## 2018-03-23 MED ORDER — IOHEXOL 300 MG/ML  SOLN
100.0000 mL | Freq: Once | INTRAMUSCULAR | Status: AC | PRN
  Administered 2018-03-23: 100 mL via INTRAVENOUS

## 2018-03-23 MED ORDER — FENTANYL CITRATE (PF) 100 MCG/2ML IJ SOLN
50.0000 ug | Freq: Once | INTRAMUSCULAR | Status: AC
  Administered 2018-03-24: 50 ug via INTRAVENOUS
  Filled 2018-03-23: qty 2

## 2018-03-23 NOTE — ED Triage Notes (Signed)
Pt from home with ems for RLQ pain and nausea for 1 week. Hot to touch. Pt alert and oriented. BP 160/78 P 88

## 2018-03-23 NOTE — ED Provider Notes (Signed)
TIME SEEN: 11:18 PM  CHIEF COMPLAINT: Multiple complaints  HPI: Patient is an 84 year old female with history of hypertension, hypothyroidism who presents to the emergency department with her son with multiple complaints.  Patient initially complains of diffuse abdominal pain that has been present intermittently for years.  And has been worse today with nausea and vomiting but no diarrhea.  Son reports symptoms seem to get worse when she is "nervous".  She has had a previous cholecystectomy.  Patient also reports subjective fevers, chills and productive cough for 3 to 4 days.  She is producing yellow sputum.  Has had a flu shot this year.  States she is also had central chest pain that she describes as a constant "pain" that started last week.  She is unable to describe it further.  Denies previous history of MI.  No history of PE or DVT.  States she does not have a cardiologist.  Per our records her last stress test was in 2015.  She is not a smoker.  She is unable to tell me if anything aggravates or alleviates any of her symptoms today.  Patient is a poor historian.  She lives at home with her elderly husband.  Son at bedside reports that she has been more confused over the past few weeks.  He states that she does have frequent urinary tract infections.   ROS: See HPI Constitutional:  fever  Eyes: no drainage  ENT: no runny nose   Cardiovascular:   chest pain  Resp:  SOB  GI: vomiting GU: no dysuria Integumentary: no rash  Allergy: no hives  Musculoskeletal: no leg swelling  Neurological: no slurred speech ROS otherwise negative  PAST MEDICAL HISTORY/PAST SURGICAL HISTORY:  Past Medical History:  Diagnosis Date  . Glaucoma   . Hypertension   . Thyroid disease     MEDICATIONS:  Prior to Admission medications   Medication Sig Start Date End Date Taking? Authorizing Provider  diazepam (VALIUM) 5 MG tablet Take 5 mg by mouth every 6 (six) hours as needed.  09/22/13   [provider]  HYDROcodone-acetaminophen (NORCO/VICODIN) 5-325 MG per tablet  07/16/13   [provider]  latanoprost (XALATAN) 0.005 % ophthalmic solution Place 1 drop into the left eye at bedtime.  08/06/13   [provider]  pantoprazole (PROTONIX) 40 MG tablet Take 40 mg by mouth daily.  07/19/13   [provider]  simvastatin (ZOCOR) 20 MG tablet Take 20 mg by mouth daily at 6 PM.  08/19/13   [provider]  SYNTHROID 88 MCG tablet 88 mcg daily before breakfast.  08/19/13   [provider]    ALLERGIES:  Allergies  Allergen Reactions  . Betadine [Povidone Iodine] Itching    SOCIAL HISTORY:  Social History   Tobacco Use  . Smoking status: Never Smoker  Substance Use Topics  . Alcohol use: Yes    Alcohol/week: 1.0 standard drinks    Types: 1 Glasses of wine per week    Comment: just occasional    FAMILY HISTORY: Family History  Problem Relation Age of Onset  . Arthritis Mother   . Heart failure Mother   . Heart attack Father   . Cancer Brother     EXAM: BP (!) 167/57   Pulse 73   Temp 99.4 F (37.4 C) (Oral)   Resp (!) 21   SpO2 96%  CONSTITUTIONAL: Alert and oriented but very slow to answer questions.  Elderly.  Appears uncomfortable. HEAD: Normocephalic  EYES: Conjunctivae clear, pupils appear equal, EOMI ENT: normal nose; moist mucous membranes NECK: Supple, no meningismus, no nuchal rigidity, no LAD  CARD: Regular and intermittently tachycardic; S1 and S2 appreciated; no murmurs, no clicks, no rubs, no gallops RESP: Normal chest excursion without splinting or tachypnea; breath sounds clear and equal bilaterally; no wheezes, no rhonchi, no rales, no hypoxia or respiratory distress, speaking full sentences, coughing up thick yellow sputum ABD/GI: Normal bowel sounds; non-distended; soft, tender throughout the abdomen, no rebound, no guarding, no peritoneal signs, no hepatosplenomegaly BACK:  The back appears normal and is  non-tender to palpation, there is no CVA tenderness EXT: Normal ROM in all joints; non-tender to palpation; no edema; normal capillary refill; no cyanosis, no calf tenderness or swelling    SKIN: Normal color for age and race; warm; no rash, skin is very warm to touch NEURO: Moves all extremities equally, no facial asymmetry, normal speech PSYCH: The patient's mood and manner are appropriate. Grooming and personal hygiene are appropriate.  MEDICAL DECISION MAKING: Patient here with multiple complaints.  She is complaining of constant chest pain and shortness of breath for the past week.  Her EKG does show new intraventricular conduction delay.  She has a known right bundle branch block.  Will obtain troponin and a chest x-ray.  She is also coughing up thick yellow sputum.  Concern for possible pneumonia or flu.  She feels warm to touch today.  Oral temperature of 99.4.  Will obtain rectal temperature, lactate and blood cultures.  Initial labs unremarkable including normal blood counts, electrolytes, renal function, LFTs and lipase.  Given history of chronic UTIs with worsening confusion over the past several weeks, also obtain urinalysis today.  Tender throughout the abdomen on examination.  Will obtain CT of the abdomen pelvis for further evaluation.  Differential includes colitis, diverticulitis, bowel obstruction, appendicitis, gastroenteritis.  Anticipate admission to the hospital.  Son comfortable with this plan.  ED PROGRESS: Patient's troponin is negative.  Lactate mildly elevated at 1.99 and she is febrile here.  Flu swab, urinalysis, blood cultures pending.  Chest x-ray shows cardiomegaly with vascular congestion and a trace pleural effusion bilaterally with patchy atelectasis at the left lung base which I think could represent pneumonia given she has productive cough with yellow sputum production.  CT scan also concerning for mild volvulus which may be causing her abdominal pain.  Will discuss with  general surgery on-call.  Will give broad-spectrum IV antibiotics.    1:40 AM  D/w Dr. Francena Hanly with general surgery who will see patient in consult.  Recommends medicine admission.  Family updated with plan.   2:12 AM Discussed patient's case with hospitalist, Dr. Toniann Fail.  I have recommended admission and patient (and family if present) agree with this plan. Admitting physician will place admission orders.   I reviewed all nursing notes, vitals, pertinent previous records, EKGs, lab and urine results, imaging (as available).     EKG Interpretation  Date/Time:  Monday March 23 2018 22:18:52 EST Ventricular Rate:  97 PR Interval:    QRS Duration: 143 QT Interval:  392 QTC Calculation: 498 R Axis:   -69 Text Interpretation:  Sinus rhythm Sinus pause Borderline prolonged PR interval IVCD, consider atypical RBBB LVH with IVCD and secondary repol abnrm RBBB with upward deflection in inferior leads qrs widenening Otherwise no significant change Confirmed by Melene Plan [phone]) on 03/23/2018 10:38:32 PM        CRITICAL CARE Performed by: Baxter Hire Ward   Total  critical care time: 65 minutes  Critical care time was exclusive of separately billable procedures and treating other patients.  Critical care was necessary to treat or prevent imminent or life-threatening deterioration.  Critical care was time spent personally by me on the following activities: development of treatment plan with patient and/or surrogate as well as nursing, discussions with consultants, evaluation of patient's response to treatment, examination of patient, obtaining history from patient or surrogate, ordering and performing treatments and interventions, ordering and review of laboratory studies, ordering and review of radiographic studies, pulse oximetry and re-evaluation of patient's condition.     Ward, Layla MawKristen N, DO 03/24/18 [phone]

## 2018-03-24 ENCOUNTER — Encounter (HOSPITAL_COMMUNITY): Payer: Self-pay | Admitting: Internal Medicine

## 2018-03-24 DIAGNOSIS — J441 Chronic obstructive pulmonary disease with (acute) exacerbation: Secondary | ICD-10-CM | POA: Diagnosis present

## 2018-03-24 DIAGNOSIS — J440 Chronic obstructive pulmonary disease with acute lower respiratory infection: Secondary | ICD-10-CM | POA: Diagnosis present

## 2018-03-24 DIAGNOSIS — I5033 Acute on chronic diastolic (congestive) heart failure: Secondary | ICD-10-CM | POA: Diagnosis present

## 2018-03-24 DIAGNOSIS — E890 Postprocedural hypothyroidism: Secondary | ICD-10-CM | POA: Diagnosis present

## 2018-03-24 DIAGNOSIS — E7800 Pure hypercholesterolemia, unspecified: Secondary | ICD-10-CM | POA: Diagnosis present

## 2018-03-24 DIAGNOSIS — Z90710 Acquired absence of both cervix and uterus: Secondary | ICD-10-CM | POA: Diagnosis not present

## 2018-03-24 DIAGNOSIS — E876 Hypokalemia: Secondary | ICD-10-CM | POA: Diagnosis present

## 2018-03-24 DIAGNOSIS — H409 Unspecified glaucoma: Secondary | ICD-10-CM | POA: Diagnosis present

## 2018-03-24 DIAGNOSIS — I351 Nonrheumatic aortic (valve) insufficiency: Secondary | ICD-10-CM | POA: Diagnosis not present

## 2018-03-24 DIAGNOSIS — E039 Hypothyroidism, unspecified: Secondary | ICD-10-CM

## 2018-03-24 DIAGNOSIS — I110 Hypertensive heart disease with heart failure: Secondary | ICD-10-CM | POA: Diagnosis present

## 2018-03-24 DIAGNOSIS — J9601 Acute respiratory failure with hypoxia: Secondary | ICD-10-CM | POA: Diagnosis present

## 2018-03-24 DIAGNOSIS — K56609 Unspecified intestinal obstruction, unspecified as to partial versus complete obstruction: Secondary | ICD-10-CM

## 2018-03-24 DIAGNOSIS — Z8249 Family history of ischemic heart disease and other diseases of the circulatory system: Secondary | ICD-10-CM | POA: Diagnosis not present

## 2018-03-24 DIAGNOSIS — I700 Atherosclerosis of aorta: Secondary | ICD-10-CM | POA: Diagnosis present

## 2018-03-24 DIAGNOSIS — B974 Respiratory syncytial virus as the cause of diseases classified elsewhere: Secondary | ICD-10-CM | POA: Diagnosis present

## 2018-03-24 DIAGNOSIS — K562 Volvulus: Principal | ICD-10-CM

## 2018-03-24 DIAGNOSIS — R739 Hyperglycemia, unspecified: Secondary | ICD-10-CM | POA: Diagnosis present

## 2018-03-24 DIAGNOSIS — K219 Gastro-esophageal reflux disease without esophagitis: Secondary | ICD-10-CM | POA: Diagnosis present

## 2018-03-24 DIAGNOSIS — J189 Pneumonia, unspecified organism: Secondary | ICD-10-CM | POA: Diagnosis present

## 2018-03-24 DIAGNOSIS — E785 Hyperlipidemia, unspecified: Secondary | ICD-10-CM | POA: Diagnosis present

## 2018-03-24 DIAGNOSIS — Z79890 Hormone replacement therapy (postmenopausal): Secondary | ICD-10-CM | POA: Diagnosis not present

## 2018-03-24 DIAGNOSIS — Z9049 Acquired absence of other specified parts of digestive tract: Secondary | ICD-10-CM | POA: Diagnosis not present

## 2018-03-24 DIAGNOSIS — Z79899 Other long term (current) drug therapy: Secondary | ICD-10-CM | POA: Diagnosis not present

## 2018-03-24 DIAGNOSIS — E872 Acidosis: Secondary | ICD-10-CM | POA: Diagnosis present

## 2018-03-24 HISTORY — DX: Unspecified intestinal obstruction, unspecified as to partial versus complete obstruction: K56.609

## 2018-03-24 HISTORY — DX: Pneumonia, unspecified organism: J18.9

## 2018-03-24 LAB — CBC WITH DIFFERENTIAL/PLATELET
Abs Immature Granulocytes: 0.2 10*3/uL — ABNORMAL HIGH (ref 0.00–0.07)
Basophils Absolute: 0 10*3/uL (ref 0.0–0.1)
Basophils Relative: 0 %
Eosinophils Absolute: 0 10*3/uL (ref 0.0–0.5)
Eosinophils Relative: 0 %
HCT: 36.2 % (ref 36.0–46.0)
Hemoglobin: 11.7 g/dL — ABNORMAL LOW (ref 12.0–15.0)
Lymphocytes Relative: 2 %
Lymphs Abs: 0.2 10*3/uL — ABNORMAL LOW (ref 0.7–4.0)
MCH: 31 pg (ref 26.0–34.0)
MCHC: 32.3 g/dL (ref 30.0–36.0)
MCV: 96 fL (ref 80.0–100.0)
MYELOCYTES: 1 %
Monocytes Absolute: 0.2 10*3/uL (ref 0.1–1.0)
Monocytes Relative: 2 %
NEUTROS ABS: 8.7 10*3/uL — AB (ref 1.7–7.7)
NEUTROS PCT: 94 %
Platelets: 131 10*3/uL — ABNORMAL LOW (ref 150–400)
Promyelocytes Relative: 1 %
RBC: 3.77 MIL/uL — ABNORMAL LOW (ref 3.87–5.11)
RDW: 12.9 % (ref 11.5–15.5)
WBC: 9.3 10*3/uL (ref 4.0–10.5)
nRBC: 0 % (ref 0.0–0.2)
nRBC: 0 /100 WBC

## 2018-03-24 LAB — COMPREHENSIVE METABOLIC PANEL
ALT: 27 U/L (ref 0–44)
ANION GAP: 11 (ref 5–15)
AST: 50 U/L — ABNORMAL HIGH (ref 15–41)
Albumin: 3 g/dL — ABNORMAL LOW (ref 3.5–5.0)
Alkaline Phosphatase: 42 U/L (ref 38–126)
BUN: 6 mg/dL — ABNORMAL LOW (ref 8–23)
CO2: 23 mmol/L (ref 22–32)
Calcium: 7.6 mg/dL — ABNORMAL LOW (ref 8.9–10.3)
Chloride: 105 mmol/L (ref 98–111)
Creatinine, Ser: 0.81 mg/dL (ref 0.44–1.00)
GFR calc Af Amer: >60 mL/min (ref >60)
GFR calc non Af Amer: >60 mL/min (ref >60)
GLUCOSE: 150 mg/dL — AB (ref 70–99)
Potassium: 3.4 mmol/L — ABNORMAL LOW (ref 3.5–5.1)
Sodium: 139 mmol/L (ref 135–145)
Total Bilirubin: 0.8 mg/dL (ref 0.3–1.2)
Total Protein: 5.7 g/dL — ABNORMAL LOW (ref 6.5–8.1)

## 2018-03-24 LAB — RESPIRATORY PANEL BY PCR
Adenovirus: NOT DETECTED
Bordetella pertussis: NOT DETECTED
Chlamydophila pneumoniae: NOT DETECTED
Coronavirus 229E: NOT DETECTED
Coronavirus HKU1: NOT DETECTED
Coronavirus NL63: NOT DETECTED
Coronavirus OC43: NOT DETECTED
Influenza A: NOT DETECTED
Influenza B: NOT DETECTED
METAPNEUMOVIRUS-RVPPCR: NOT DETECTED
Mycoplasma pneumoniae: NOT DETECTED
Parainfluenza Virus 1: NOT DETECTED
Parainfluenza Virus 2: NOT DETECTED
Parainfluenza Virus 3: NOT DETECTED
Parainfluenza Virus 4: NOT DETECTED
Respiratory Syncytial Virus: DETECTED — AB
Rhinovirus / Enterovirus: NOT DETECTED

## 2018-03-24 LAB — I-STAT CG4 LACTIC ACID, ED
LACTIC ACID, VENOUS: 1.45 mmol/L (ref 0.5–1.9)
Lactic Acid, Venous: 1.99 mmol/L — ABNORMAL HIGH (ref 0.5–1.9)

## 2018-03-24 LAB — URINALYSIS, ROUTINE W REFLEX MICROSCOPIC
Bacteria, UA: NONE SEEN
Bilirubin Urine: NEGATIVE
Glucose, UA: 50 mg/dL — AB
Ketones, ur: 80 mg/dL — AB
LEUKOCYTES UA: NEGATIVE
NITRITE: NEGATIVE
PH: 6 (ref 5.0–8.0)
Protein, ur: 100 mg/dL — AB
Specific Gravity, Urine: 1.044 — ABNORMAL HIGH (ref 1.005–1.030)

## 2018-03-24 LAB — INFLUENZA PANEL BY PCR (TYPE A & B)
Influenza A By PCR: NEGATIVE
Influenza B By PCR: NEGATIVE

## 2018-03-24 LAB — STREP PNEUMONIAE URINARY ANTIGEN: Strep Pneumo Urinary Antigen: NEGATIVE

## 2018-03-24 LAB — I-STAT TROPONIN, ED: Troponin i, poc: 0.03 ng/mL (ref 0.00–0.08)

## 2018-03-24 MED ORDER — METRONIDAZOLE IN NACL 5-0.79 MG/ML-% IV SOLN
500.0000 mg | Freq: Three times a day (TID) | INTRAVENOUS | Status: DC
  Administered 2018-03-24 – 2018-03-26 (×7): 500 mg via INTRAVENOUS
  Filled 2018-03-24 (×8): qty 100

## 2018-03-24 MED ORDER — SODIUM CHLORIDE 0.9 % IV SOLN
1.0000 g | INTRAVENOUS | Status: DC
  Administered 2018-03-24 – 2018-03-28 (×5): 1 g via INTRAVENOUS
  Filled 2018-03-24 (×6): qty 10

## 2018-03-24 MED ORDER — LATANOPROST 0.005 % OP SOLN
1.0000 [drp] | Freq: Every day | OPHTHALMIC | Status: DC
  Administered 2018-03-24 – 2018-03-30 (×7): 1 [drp] via OPHTHALMIC
  Filled 2018-03-24: qty 2.5

## 2018-03-24 MED ORDER — SODIUM CHLORIDE 0.9 % IV BOLUS (SEPSIS)
1000.0000 mL | Freq: Once | INTRAVENOUS | Status: AC
  Administered 2018-03-24: 1000 mL via INTRAVENOUS

## 2018-03-24 MED ORDER — GUAIFENESIN-DM 100-10 MG/5ML PO SYRP: 10.0000 mL | ORAL_SOLUTION | Freq: Three times a day (TID) | ORAL | Status: DC | PRN

## 2018-03-24 MED ORDER — LEVOTHYROXINE SODIUM 100 MCG/5ML IV SOLN
44.0000 ug | Freq: Every day | INTRAVENOUS | Status: DC
  Filled 2018-03-24: qty 5

## 2018-03-24 MED ORDER — SODIUM CHLORIDE 0.9 % IV SOLN
500.0000 mg | INTRAVENOUS | Status: DC
  Administered 2018-03-24 – 2018-03-27 (×4): 500 mg via INTRAVENOUS
  Filled 2018-03-24 (×6): qty 500

## 2018-03-24 MED ORDER — IPRATROPIUM-ALBUTEROL 0.5-2.5 (3) MG/3ML IN SOLN
3.0000 mL | Freq: Three times a day (TID) | RESPIRATORY_TRACT | Status: DC
  Administered 2018-03-25 – 2018-03-29 (×12): 3 mL via RESPIRATORY_TRACT
  Filled 2018-03-24 (×12): qty 3

## 2018-03-24 MED ORDER — GUAIFENESIN ER 600 MG PO TB12
1200.0000 mg | ORAL_TABLET | Freq: Two times a day (BID) | ORAL | Status: DC
  Administered 2018-03-24 – 2018-03-31 (×15): 1200 mg via ORAL
  Filled 2018-03-24 (×15): qty 2

## 2018-03-24 MED ORDER — ACETAMINOPHEN 650 MG RE SUPP
650.0000 mg | Freq: Four times a day (QID) | RECTAL | Status: DC | PRN
  Administered 2018-03-24: 650 mg via RECTAL
  Filled 2018-03-24: qty 1

## 2018-03-24 MED ORDER — SODIUM CHLORIDE 0.9 % IV SOLN
INTRAVENOUS | Status: DC
  Administered 2018-03-24 – 2018-03-25 (×2): via INTRAVENOUS

## 2018-03-24 MED ORDER — ONDANSETRON HCL 4 MG PO TABS: 4.0000 mg | ORAL_TABLET | Freq: Four times a day (QID) | ORAL | Status: DC | PRN

## 2018-03-24 MED ORDER — VANCOMYCIN HCL 10 G IV SOLR
1500.0000 mg | Freq: Once | INTRAVENOUS | Status: AC
  Administered 2018-03-24: 1500 mg via INTRAVENOUS
  Filled 2018-03-24: qty 1500

## 2018-03-24 MED ORDER — SODIUM CHLORIDE 0.9 % IV SOLN
2.0000 g | Freq: Once | INTRAVENOUS | Status: AC
  Administered 2018-03-24: 2 g via INTRAVENOUS
  Filled 2018-03-24: qty 2

## 2018-03-24 MED ORDER — IPRATROPIUM-ALBUTEROL 0.5-2.5 (3) MG/3ML IN SOLN
3.0000 mL | Freq: Four times a day (QID) | RESPIRATORY_TRACT | Status: DC | PRN
  Administered 2018-03-27: 3 mL via RESPIRATORY_TRACT
  Filled 2018-03-24: qty 3

## 2018-03-24 MED ORDER — VANCOMYCIN HCL IN DEXTROSE 1-5 GM/200ML-% IV SOLN: 1000.0000 mg | Freq: Once | INTRAVENOUS | Status: DC

## 2018-03-24 MED ORDER — ACETAMINOPHEN 500 MG PO TABS
1000.0000 mg | ORAL_TABLET | Freq: Once | ORAL | Status: AC
  Administered 2018-03-24: 1000 mg via ORAL
  Filled 2018-03-24: qty 2

## 2018-03-24 MED ORDER — IPRATROPIUM-ALBUTEROL 0.5-2.5 (3) MG/3ML IN SOLN
3.0000 mL | Freq: Four times a day (QID) | RESPIRATORY_TRACT | Status: DC
  Administered 2018-03-24 (×3): 3 mL via RESPIRATORY_TRACT
  Filled 2018-03-24 (×3): qty 3

## 2018-03-24 MED ORDER — GUAIFENESIN-DM 100-10 MG/5ML PO SYRP
10.0000 mL | ORAL_SOLUTION | Freq: Four times a day (QID) | ORAL | Status: DC | PRN
  Administered 2018-03-24: 10 mL via ORAL
  Filled 2018-03-24: qty 10

## 2018-03-24 MED ORDER — ACETAMINOPHEN 325 MG PO TABS
650.0000 mg | ORAL_TABLET | Freq: Four times a day (QID) | ORAL | Status: DC | PRN
  Administered 2018-03-24 – 2018-03-30 (×5): 650 mg via ORAL
  Filled 2018-03-24 (×5): qty 2

## 2018-03-24 MED ORDER — PANTOPRAZOLE SODIUM 40 MG IV SOLR
40.0000 mg | INTRAVENOUS | Status: DC
  Administered 2018-03-24: 40 mg via INTRAVENOUS
  Filled 2018-03-24: qty 40

## 2018-03-24 MED ORDER — GUAIFENESIN 100 MG/5ML PO SOLN
10.0000 mL | Freq: Once | ORAL | Status: AC
  Administered 2018-03-24: 200 mg via ORAL
  Filled 2018-03-24: qty 10

## 2018-03-24 MED ORDER — ONDANSETRON HCL 4 MG/2ML IJ SOLN: 4.0000 mg | Freq: Four times a day (QID) | INTRAMUSCULAR | Status: DC | PRN

## 2018-03-24 NOTE — ED Notes (Signed)
Help get patient up in the bed and change patient gown patient is resting with call bell in reach

## 2018-03-24 NOTE — Progress Notes (Signed)
SLP Cancellation Note  Patient Details Name: Iran SizerBobbie R Calais MRN: [id number] DOB: 07-16-1932   Cancelled treatment:       Reason Eval/Treat Not Completed: Medical issues which prohibited therapy; Pt NPO for bowel rest per general surgery.  MD note indicates SLP swallow eval is warranted after pt is approved for diet.  Will await readiness.     Blenda MountsCouture, Tyan Dy Laurice 03/24/2018, 3:45 PM

## 2018-03-24 NOTE — Progress Notes (Signed)
Assessment & Plan: Rule out cecal / ileal volvulus  Benign abdominal exam in ER at this time  Medicine is admitting for pneumonia  Will follow        Darnell Levelodd Daisuke Bailey, MD       [facility]       Office: [phone]   Chief Complaint: Abdominal pain  Subjective: Patient in ER currently, nursing at bedside.  Denies abdominal pain.  Objective: Vital signs in last 24 hours: Temp:  [99 F (37.2 C)-102.4 F (39.1 C)] 99.5 F (37.5 C) (12/31 1232) Pulse Rate:  [34-113] 108 (12/31 1100) Resp:  [14-33] 24 (12/31 1100) BP: (105-167)/(43-98) 134/88 (12/31 1100) SpO2:  [87 %-100 %] 99 % (12/31 1100) Weight:  [70.3 kg] 70.3 kg (12/31 0204)    Intake/Output from previous day: 12/30 0701 - 12/31 0700 In: 2917.9 [P.O.:150; I.V.:1067.9; IV Piggyback:1700] Out: 325 [Urine:325] Intake/Output this shift: No intake/output data recorded.  Physical Exam: HEENT - sclerae clear, mucous membranes moist Abdomen - soft without distension; non-tender; no mass   Lab Results:  Recent Labs    03/23/18 2220 03/24/18 0450  WBC 9.5 9.3  HGB 13.8 11.7*  HCT 41.7 36.2  PLT 155 131*   BMET Recent Labs    03/23/18 2220 03/24/18 0450  NA 137 139  K 3.3* 3.4*  CL 101 105  CO2 23 23  GLUCOSE 150* 150*  BUN 8 6*  CREATININE 0.82 0.81  CALCIUM 8.9 7.6*   PT/INR No results for input(s): LABPROT, INR in the last 72 hours. Comprehensive Metabolic Panel:    Component Value Date/Time   NA 139 03/24/2018 0450   NA 137 03/23/2018 2220   K 3.4 (L) 03/24/2018 0450   K 3.3 (L) 03/23/2018 2220   CL 105 03/24/2018 0450   CL 101 03/23/2018 2220   CO2 23 03/24/2018 0450   CO2 23 03/23/2018 2220   BUN 6 (L) 03/24/2018 0450   BUN 8 03/23/2018 2220   CREATININE 0.81 03/24/2018 0450   CREATININE 0.82 03/23/2018 2220   GLUCOSE 150 (H) 03/24/2018 0450   GLUCOSE 150 (H) 03/23/2018 2220   CALCIUM 7.6 (L) 03/24/2018 0450   CALCIUM 8.9 03/23/2018 2220   AST 50 (H) 03/24/2018  0450   AST 37 03/23/2018 2220   ALT 27 03/24/2018 0450   ALT 23 03/23/2018 2220   ALKPHOS 42 03/24/2018 0450   ALKPHOS 53 03/23/2018 2220   BILITOT 0.8 03/24/2018 0450   BILITOT 1.3 (H) 03/23/2018 2220   PROT 5.7 (L) 03/24/2018 0450   PROT 7.1 03/23/2018 2220   ALBUMIN 3.0 (L) 03/24/2018 0450   ALBUMIN 3.6 03/23/2018 2220    Studies/Results: Dg Chest 2 View  Result Date: 03/23/2018 CLINICAL DATA:  Cough, short of breath EXAM: CHEST - 2 VIEW COMPARISON:  08/07/2017 FINDINGS: Trace pleural effusions. Mild cardiomegaly with vascular congestion. Tortuous calcified aorta. Patchy atelectasis at the left base. No pneumothorax. IMPRESSION: 1. Cardiomegaly with vascular congestion and trace pleural effusion 2. Patchy atelectasis at the left base Electronically Signed   By: Jasmine PangKim  Fujinaga M.D.   On: 03/23/2018 23:54   Ct Abdomen Pelvis W Contrast  Result Date: 03/24/2018 CLINICAL DATA:  Acute onset of right lower quadrant abdominal pain and nausea. EXAM: CT ABDOMEN AND PELVIS WITH CONTRAST TECHNIQUE: Multidetector CT imaging of the abdomen and pelvis was performed using the standard protocol following bolus administration of intravenous contrast. CONTRAST:  100mL OMNIPAQUE IOHEXOL 300 MG/ML  SOLN COMPARISON:  CT of  the abdomen and pelvis performed 07/01/2017 FINDINGS: Lower chest: Mild bibasilar opacities likely reflect atelectasis. The visualized portions of the mediastinum are unremarkable. Hepatobiliary: The liver is unremarkable in appearance. The patient is status post cholecystectomy, with clips noted at the gallbladder fossa. The common bile duct remains normal in caliber. Pancreas: The pancreas is within normal limits. Spleen: The spleen is unremarkable in appearance. Adrenals/Urinary Tract: The adrenal glands are unremarkable in appearance. Scattered bilateral renal cysts are seen. There is no evidence of hydronephrosis. No renal or ureteral stones are identified. No perinephric stranding is  appreciated. Stomach/Bowel: The stomach is unremarkable in appearance. There is unusual mild focal twisting of the distal ileum and ileocecal junction at the right lower quadrant, which may correspond to the patient's symptoms. There is no evidence for bowel obstruction at this time. The appendix is normal in caliber, without evidence of appendicitis. The colon is unremarkable in appearance. Vascular/Lymphatic: Scattered calcification is seen along the abdominal aorta and its branches. The abdominal aorta is otherwise grossly unremarkable. The inferior vena cava is grossly unremarkable. No retroperitoneal lymphadenopathy is seen. No pelvic sidewall lymphadenopathy is identified. Reproductive: The bladder is mildly distended and grossly unremarkable. The patient is status post hysterectomy. No suspicious adnexal masses are seen. The right ovary is grossly unremarkable in appearance. Other: No additional soft tissue abnormalities are seen. Musculoskeletal: No acute osseous abnormalities are identified. Multilevel vacuum phenomenon is noted along the lower thoracic and lumbar spine. The visualized musculature is unremarkable in appearance. IMPRESSION: 1. Unusual mild focal twisting of the distal ileum and ileocecal junction at the right lower quadrant, suggesting mild volvulus, which may correspond to the patient's symptoms. No evidence for bowel obstruction at this time. 2. Scattered bilateral renal cysts. 3. Mild bibasilar airspace opacities likely reflect atelectasis. Aortic Atherosclerosis (ICD10-I70.0). Electronically Signed   By: Roanna RaiderJeffery  Chang M.D.   On: 03/24/2018 00:28      Lilyrose Tanney Judie PetitM 03/24/2018

## 2018-03-24 NOTE — ED Notes (Signed)
2nd RN attempted secondary IV access without success.

## 2018-03-24 NOTE — ED Notes (Signed)
Pt repositioned in bed and placed new brief under pt. Pt had two episodes of runny yellow stool. RN notified.

## 2018-03-24 NOTE — ED Notes (Signed)
Attempted to gain secondary IV access x2, without success.

## 2018-03-24 NOTE — ED Notes (Signed)
Patient transported to X-ray 

## 2018-03-24 NOTE — ED Notes (Signed)
IV pump heard alarming. Found pt standing at foot of bed, having pulled almost all of her leads, purewick catheter, and attached IV catheter off. Pt states that she "heard her son outside her door, trying to get in." Pt A&Ox3, re-oriented to situation in hospital, location of call bell, and importance of calling if she needs help or feels she needs to get up. Pt voices understanding. Assisted back to bed by RN & NT.

## 2018-03-24 NOTE — ED Notes (Signed)
Unable to start antibiotic at this time due to IV occluded. Pt is difficult stick, awaiting IV team

## 2018-03-24 NOTE — H&P (Signed)
History and Physical    Jordan SizerBobbie R Horne ZOX:[id number] DOB: [birth year] DOA: 03/23/2018  PCP: Dema SeverinYork, Regina F, NP  Patient coming from: Home.  Chief Complaint: Abdominal pain.  HPI: Jordan Horne is a 84 y.o. 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  Patient has been having abdominal pain for last few months which has been persistent so patient decided to come to the ER.  Patient has nausea vomiting at times.  Last bowel movement was few days ago.  Note that patient is a poor historian.  Patient also states that she has been having productive cough with discolored sputum for last few days and has been exhibiting feeling of fever chills.  Abdominal pain is mostly upper quadrants across abdomen no radiation no relation to food.  ED Course: In the ER patient is found to be febrile with temperature of 102 F.  CT of the abdomen shows possibility of volvulus at the ileum.  On-call general surgeon was consulted.  Patient was febrile and was started on empiric antibiotics for possible pneumonia given the clinical picture.  Review of Systems: As per HPI, rest all negative.   Past Medical History:  Diagnosis Date  . Glaucoma   . Hypertension   . Thyroid disease     Past Surgical History:  Procedure Laterality Date  . ABDOMINAL HYSTERECTOMY    . THYROID CYST EXCISION       reports that she has never smoked. She has never used smokeless tobacco. She reports current alcohol use of about 1.0 standard drinks of alcohol per week. She reports that she does not use drugs.  Allergies  Allergen Reactions  . Betadine [Povidone Iodine] Itching    Family History  Problem Relation Age of Onset  . Arthritis Mother   . Heart failure Mother   . Heart attack Father   . Cancer Brother     Prior to Admission medications   Medication Sig Start Date End Date Taking? Authorizing Provider  diazepam  (VALIUM) 5 MG tablet Take 5 mg by mouth every 6 (six) hours as needed.  09/22/13   [provider]  HYDROcodone-acetaminophen (NORCO/VICODIN) 5-325 MG per tablet  07/16/13   [provider]  latanoprost (XALATAN) 0.005 % ophthalmic solution Place 1 drop into the left eye at bedtime.  08/06/13   [provider]  pantoprazole (PROTONIX) 40 MG tablet Take 40 mg by mouth daily.  07/19/13   [provider]  simvastatin (ZOCOR) 20 MG tablet Take 20 mg by mouth daily at 6 PM.  08/19/13   [provider]  SYNTHROID 88 MCG tablet 88 mcg daily before breakfast.  08/19/13   [provider]    Physical Exam: Vitals:   03/24/18 0204 03/24/18 0230 03/24/18 0300 03/24/18 0308  BP:  (!) 117/97 105/86   Pulse:  93 (!) 101   Resp:  20 (!) 25   Temp:    99 F (37.2 C)  TempSrc:    Oral  SpO2:  96% 99%   Weight: 70.3 kg     Height: 5' 3.5" (1.613 m)         Constitutional: Moderately built and nourished. Vitals:   03/24/18 0204 03/24/18 0230 03/24/18 0300 03/24/18 0308  BP:  (!) 117/97 105/86   Pulse:  93 (!) 101   Resp:  20 (!) 25   Temp:    99 F (37.2 C)  TempSrc:  Oral  SpO2:  96% 99%   Weight: 70.3 kg     Height: 5' 3.5" (1.613 m)      Eyes: Anicteric no pallor. ENMT: No discharge from the ears eyes nose or mouth. Neck: No mass felt.  No neck rigidity.  No JVD appreciated. Respiratory: No rhonchi or crepitations. Cardiovascular: S1-S2 heard. Abdomen: Soft nontender bowel sounds present but feeble. Musculoskeletal: No edema. Skin: No rash. Neurologic: Alert awake oriented to time place and person.  Moves all extremities. Psychiatric: Appears normal.   Labs on Admission: I have personally reviewed following labs and imaging studies  CBC: Recent Labs  Lab 03/23/18 2220  WBC 9.5  HGB 13.8  HCT 41.7  MCV 93.5  PLT 155   Basic Metabolic Panel: Recent Labs  Lab 03/23/18 2220  NA 137  K 3.3*  CL 101  CO2 23  GLUCOSE 150*    BUN 8  CREATININE 0.82  CALCIUM 8.9   GFR: Estimated Creatinine Clearance: 49.5 mL/min (by C-G formula based on SCr of 0.82 mg/dL). Liver Function Tests: Recent Labs  Lab 03/23/18 2220  AST 37  ALT 23  ALKPHOS 53  BILITOT 1.3*  PROT 7.1  ALBUMIN 3.6   Recent Labs  Lab 03/23/18 2220  LIPASE 19   No results for input(s): AMMONIA in the last 168 hours. Coagulation Profile: No results for input(s): INR, PROTIME in the last 168 hours. Cardiac Enzymes: No results for input(s): CKTOTAL, CKMB, CKMBINDEX, TROPONINI in the last 168 hours. BNP (last 3 results) No results for input(s): PROBNP in the last 8760 hours. HbA1C: No results for input(s): HGBA1C in the last 72 hours. CBG: No results for input(s): GLUCAP in the last 168 hours. Lipid Profile: No results for input(s): CHOL, HDL, LDLCALC, TRIG, CHOLHDL, LDLDIRECT in the last 72 hours. Thyroid Function Tests: No results for input(s): TSH, T4TOTAL, FREET4, T3FREE, THYROIDAB in the last 72 hours. Anemia Panel: No results for input(s): VITAMINB12, FOLATE, FERRITIN, TIBC, IRON, RETICCTPCT in the last 72 hours. Urine analysis:    Component Value Date/Time   COLORURINE YELLOW 03/24/2018 0107   APPEARANCEUR CLEAR 03/24/2018 0107   LABSPEC 1.044 (H) 03/24/2018 0107   PHURINE 6.0 03/24/2018 0107   GLUCOSEU 50 (A) 03/24/2018 0107   HGBUR SMALL (A) 03/24/2018 0107   BILIRUBINUR NEGATIVE 03/24/2018 0107   KETONESUR 80 (A) 03/24/2018 0107   PROTEINUR 100 (A) 03/24/2018 0107   UROBILINOGEN 0.2 12/09/2009 0006   NITRITE NEGATIVE 03/24/2018 0107   LEUKOCYTESUR NEGATIVE 03/24/2018 0107   Sepsis Labs: @LABRCNTIP (procalcitonin:4,lacticidven:4) )No results found for this or any previous visit (from the past 240 hour(s)).   Radiological Exams on Admission: Dg Chest 2 View  Result Date: 03/23/2018 CLINICAL DATA:  Cough, short of breath EXAM: CHEST - 2 VIEW COMPARISON:  08/07/2017 FINDINGS: Trace pleural effusions. Mild cardiomegaly  with vascular congestion. Tortuous calcified aorta. Patchy atelectasis at the left base. No pneumothorax. IMPRESSION: 1. Cardiomegaly with vascular congestion and trace pleural effusion 2. Patchy atelectasis at the left base Electronically Signed   By: Jasmine Pang M.D.   On: 03/23/2018 23:54   Ct Abdomen Pelvis W Contrast  Result Date: 03/24/2018 CLINICAL DATA:  Acute onset of right lower quadrant abdominal pain and nausea. EXAM: CT ABDOMEN AND PELVIS WITH CONTRAST TECHNIQUE: Multidetector CT imaging of the abdomen and pelvis was performed using the standard protocol following bolus administration of intravenous contrast. CONTRAST:  OMNIPAQUE IOHEXOL 300 MG/ML  SOLN COMPARISON:  CT of the abdomen and pelvis performed  07/01/2017 FINDINGS: Lower chest: Mild bibasilar opacities likely reflect atelectasis. The visualized portions of the mediastinum are unremarkable. Hepatobiliary: The liver is unremarkable in appearance. The patient is status post cholecystectomy, with clips noted at the gallbladder fossa. The common bile duct remains normal in caliber. Pancreas: The pancreas is within normal limits. Spleen: The spleen is unremarkable in appearance. Adrenals/Urinary Tract: The adrenal glands are unremarkable in appearance. Scattered bilateral renal cysts are seen. There is no evidence of hydronephrosis. No renal or ureteral stones are identified. No perinephric stranding is appreciated. Stomach/Bowel: The stomach is unremarkable in appearance. There is unusual mild focal twisting of the distal ileum and ileocecal junction at the right lower quadrant, which may correspond to the patient's symptoms. There is no evidence for bowel obstruction at this time. The appendix is normal in caliber, without evidence of appendicitis. The colon is unremarkable in appearance. Vascular/Lymphatic: Scattered calcification is seen along the abdominal aorta and its branches. The abdominal aorta is otherwise grossly  unremarkable. The inferior vena cava is grossly unremarkable. No retroperitoneal lymphadenopathy is seen. No pelvic sidewall lymphadenopathy is identified. Reproductive: The bladder is mildly distended and grossly unremarkable. The patient is status post hysterectomy. No suspicious adnexal masses are seen. The right ovary is grossly unremarkable in appearance. Other: No additional soft tissue abnormalities are seen. Musculoskeletal: No acute osseous abnormalities are identified. Multilevel vacuum phenomenon is noted along the lower thoracic and lumbar spine. The visualized musculature is unremarkable in appearance. IMPRESSION: 1. Unusual mild focal twisting of the distal ileum and ileocecal junction at the right lower quadrant, suggesting mild volvulus, which may correspond to the patient's symptoms. No evidence for bowel obstruction at this time. 2. Scattered bilateral renal cysts. 3. Mild bibasilar airspace opacities likely reflect atelectasis. Aortic Atherosclerosis (ICD10-I70.0). Electronically Signed   By: Roanna RaiderJeffery  Chang M.D.   On: 03/24/2018 00:28    EKG: Independently reviewed.  Normal sinus rhythm with IVCD.  Assessment/Plan Principal Problem:   SBO (small bowel obstruction) (HCC) Active Problems:   Hypothyroidism   Hypercholesterolemia   CAP (community acquired pneumonia)    1. Small bowel obstruction with possible volvulus -appreciate general surgery consult.  Patient n.p.o. for now.  Further recommendations per general surgery. 2. Community-acquired pneumonia -patient symptoms are consistent with community-acquired pneumonia though chest x-ray and CAT scan does not show any definite infiltrates though does show some basilar atelectasis.  Follow sputum cultures influenza PCR urine for Legionella strep antigen.  Could also be from aspiration. 3. Hypothyroidism postoperatively keep patient IV Synthroid until patient can take orally. 4. Hyperlipidemia continue home medications when patient  can take orally. 5. Mild hyperglycemia -follow metabolic panel if still elevated may check hemoglobin A1c. 6. Mild hypokalemia -replace and recheck. 7. History of glaucoma.   DVT prophylaxis: SCDs. Code Status: Full code. Family Communication: Discussed with patient. Disposition Plan: Home. Consults called: General surgery. Admission status: Inpatient.   Eduard ClosArshad N Tyreke Kaeser MD [facility] Pager [phone].  If 7PM-7AM, please contact night-coverage [email or web address] Password [email or web address]  03/24/2018, 4:24 AM

## 2018-03-24 NOTE — Consult Note (Signed)
Reason for Consult: abdominal pain Referring Physician: [facility]  Jordan SizerBobbie R Horne is an 84 y.o. female.  HPI: 84 yo female with intermittent abdominal pain for last year presents with new episode. Pain is in her right side. In the past she would take a laxative and the pain would resolve. Her last bowel movement was 1 week ago. Pain comes and goes. She has been to [place] times for the pain. She denies nausea or vomiting. She thinks she has lost 4 pounds in the last month.  Past Medical History:  Diagnosis Date  . Glaucoma   . Hypertension   . Thyroid disease     Past Surgical History:  Procedure Laterality Date  . ABDOMINAL HYSTERECTOMY    . THYROID CYST EXCISION      Family History  Problem Relation Age of Onset  . Arthritis Mother   . Heart failure Mother   . Heart attack Father   . Cancer Brother     Social History:  reports that she has never smoked. She does not have any smokeless tobacco history on file. She reports current alcohol use of about 1.0 standard drinks of alcohol per week. She reports that she does not use drugs.  Allergies:  Allergies  Allergen Reactions  . Betadine [Povidone Iodine] Itching    Medications: I have reviewed the patient's current medications.  Results for orders placed or performed during the hospital encounter of 03/23/18 (from the past 48 hour(s))  Lipase, blood     Status: None   Collection Time: 03/23/18 10:20 PM  Result Value Ref Range   Lipase 19 11 - 51 U/L    Comment: Performed at [facility], [address] [place], [place] [address]  Comprehensive metabolic panel     Status: Abnormal   Collection Time: 03/23/18 10:20 PM  Result Value Ref Range   Sodium 137 135 - 145 mmol/L   Potassium 3.3 (L) 3.5 - 5.1 mmol/L   Chloride 101 98 - 111 mmol/L   CO2 23 22 - 32 mmol/L   Glucose, Bld 150 (H) 70 - 99 mg/dL   BUN 8 8 - 23 mg/dL   Creatinine, Ser 6.290.82 0.44 - 1.00 mg/dL   Calcium 8.9 8.9 - 52.810.3 mg/dL   Total Protein 7.1 6.5 - 8.1 g/dL   Albumin 3.6 3.5 - 5.0 g/dL   AST 37 15 - 41 U/L   ALT 23 0 - 44 U/L   Alkaline Phosphatase 53 38 - 126 U/L   Total Bilirubin 1.3 (H) 0.3 - 1.2 mg/dL   GFR calc non Af Amer >60 >60 mL/min   GFR calc Af Amer >60 >60 mL/min   Anion gap 13 5 - 15    Comment: Performed at [facility], [address] [place], [place] [address]  CBC     Status: None   Collection Time: 03/23/18 10:20 PM  Result Value Ref Range   WBC 9.5 4.0 - 10.5 K/uL   RBC 4.46 3.87 - 5.11 MIL/uL   Hemoglobin 13.8 12.0 - 15.0 g/dL   HCT 40.141.7 02.736.0 - 25.346.0 %   MCV 93.5 80.0 - 100.0 fL   MCH 30.9 26.0 - 34.0 pg   MCHC 33.1 30.0 - 36.0 g/dL   RDW 66.412.6 40.311.5 - 47.415.5 %   Platelets 155 150 - 400 K/uL   nRBC 0.0 0.0 - 0.2 %    Comment: Performed at [facility], [address]  [place], [place] [address]  I-stat troponin, ED     Status: None   Collection Time: 03/24/18 12:37 AM  Result Value Ref Range   Troponin i, poc 0.03 0.00 - 0.08 ng/mL   Comment 3            Comment: Due to the release kinetics of cTnI, a negative result within the first hours of the onset of symptoms does not rule out myocardial infarction with certainty. If myocardial infarction is still suspected, repeat the test at appropriate intervals.   I-Stat CG4 Lactic Acid, ED     Status: Abnormal   Collection Time: 03/24/18 12:40 AM  Result Value Ref Range   Lactic Acid, Venous 1.99 (H) 0.5 - 1.9 mmol/L  Urinalysis, Routine w reflex microscopic     Status: Abnormal   Collection Time: 03/24/18  1:07 AM  Result Value Ref Range   Color, Urine YELLOW YELLOW   APPearance CLEAR CLEAR   Specific Gravity, Urine 1.044 (H) 1.005 - 1.030   pH 6.0 5.0 - 8.0   Glucose, UA 50 (A) NEGATIVE mg/dL   Hgb urine dipstick SMALL (A) NEGATIVE   Bilirubin Urine NEGATIVE NEGATIVE   Ketones, ur 80 (A) NEGATIVE mg/dL   Protein, ur 604 (A) NEGATIVE mg/dL   Nitrite NEGATIVE NEGATIVE   Leukocytes, UA NEGATIVE NEGATIVE   RBC /  HPF 6-10 0 - 5 RBC/hpf   WBC, UA 0-5 0 - 5 WBC/hpf   Bacteria, UA NONE SEEN NONE SEEN   Mucus PRESENT     Comment: Performed at [facility], [address] [place], [place] [address]  I-Stat CG4 Lactic Acid, ED     Status: None   Collection Time: 03/24/18  3:02 AM  Result Value Ref Range   Lactic Acid, Venous 1.45 0.5 - 1.9 mmol/L    Dg Chest 2 View  Result Date: 03/23/2018 CLINICAL DATA:  Cough, short of breath EXAM: CHEST - 2 VIEW COMPARISON:  08/07/2017 FINDINGS: Trace pleural effusions. Mild cardiomegaly with vascular congestion. Tortuous calcified aorta. Patchy atelectasis at the left base. No pneumothorax. IMPRESSION: 1. Cardiomegaly with vascular congestion and trace pleural effusion 2. Patchy atelectasis at the left base Electronically Signed   By: Jasmine Pang M.D.   On: 03/23/2018 23:54   Ct Abdomen Pelvis W Contrast  Result Date: 03/24/2018 CLINICAL DATA:  Acute onset of right lower quadrant abdominal pain and nausea. EXAM: CT ABDOMEN AND PELVIS WITH CONTRAST TECHNIQUE: Multidetector CT imaging of the abdomen and pelvis was performed using the standard protocol following bolus administration of intravenous contrast. CONTRAST:  OMNIPAQUE IOHEXOL 300 MG/ML  SOLN COMPARISON:  CT of the abdomen and pelvis performed 07/01/2017 FINDINGS: Lower chest: Mild bibasilar opacities likely reflect atelectasis. The visualized portions of the mediastinum are unremarkable. Hepatobiliary: The liver is unremarkable in appearance. The patient is status post cholecystectomy, with clips noted at the gallbladder fossa. The common bile duct remains normal in caliber. Pancreas: The pancreas is within normal limits. Spleen: The spleen is unremarkable in appearance. Adrenals/Urinary Tract: The adrenal glands are unremarkable in appearance. Scattered bilateral renal cysts are seen. There is no evidence of hydronephrosis. No renal or ureteral stones are identified. No perinephric stranding is  appreciated. Stomach/Bowel: The stomach is unremarkable in appearance. There is unusual mild focal twisting of the distal ileum and ileocecal junction at the right lower quadrant, which may correspond to the patient's symptoms. There is no evidence for bowel obstruction at this time. The appendix is normal  in caliber, without evidence of appendicitis. The colon is unremarkable in appearance. Vascular/Lymphatic: Scattered calcification is seen along the abdominal aorta and its branches. The abdominal aorta is otherwise grossly unremarkable. The inferior vena cava is grossly unremarkable. No retroperitoneal lymphadenopathy is seen. No pelvic sidewall lymphadenopathy is identified. Reproductive: The bladder is mildly distended and grossly unremarkable. The patient is status post hysterectomy. No suspicious adnexal masses are seen. The right ovary is grossly unremarkable in appearance. Other: No additional soft tissue abnormalities are seen. Musculoskeletal: No acute osseous abnormalities are identified. Multilevel vacuum phenomenon is noted along the lower thoracic and lumbar spine. The visualized musculature is unremarkable in appearance. IMPRESSION: 1. Unusual mild focal twisting of the distal ileum and ileocecal junction at the right lower quadrant, suggesting mild volvulus, which may correspond to the patient's symptoms. No evidence for bowel obstruction at this time. 2. Scattered bilateral renal cysts. 3. Mild bibasilar airspace opacities likely reflect atelectasis. Aortic Atherosclerosis (ICD10-I70.0). Electronically Signed   By: Roanna RaiderJeffery  Chang M.D.   On: 03/24/2018 00:28    Review of Systems  Constitutional: Negative for chills and fever.  HENT: Negative for hearing loss.   Eyes: Negative for blurred vision and double vision.  Respiratory: Negative for cough and hemoptysis.   Cardiovascular: Negative for chest pain and palpitations.  Gastrointestinal: Positive for abdominal pain. Negative for nausea and  vomiting.  Genitourinary: Negative for dysuria and urgency.  Musculoskeletal: Negative for myalgias and neck pain.  Skin: Negative for itching and rash.  Neurological: Negative for dizziness, tingling and headaches.  Endo/Heme/Allergies: Does not bruise/bleed easily.  Psychiatric/Behavioral: Negative for depression and suicidal ideas.   Blood pressure 105/86, pulse (!) 101, temperature 99 F (37.2 C), temperature source Oral, resp. rate (!) 25, height 5' 3.5" (1.613 m), weight 70.3 kg, SpO2 99 %. Physical Exam  Vitals reviewed. Constitutional: She is oriented to person, place, and time. She appears well-developed and well-nourished.  HENT:  Head: Normocephalic and atraumatic.  Eyes: Pupils are equal, round, and reactive to light. Conjunctivae and EOM are normal.  Neck: Normal range of motion. Neck supple.  Cardiovascular: Normal rate and regular rhythm.  Respiratory: Effort normal and breath sounds normal.  GI: Soft. Bowel sounds are normal. She exhibits no distension. There is abdominal tenderness in the right lower quadrant. There is no guarding and no tenderness at McBurney's point.  Musculoskeletal: Normal range of motion.  Neurological: She is alert and oriented to person, place, and time.  Skin: Skin is warm and dry.  Psychiatric: She has a normal mood and affect. Her behavior is normal.      Assessment/Plan: 84 yo female with abdominal pain and findings of slight swirl of terminal ileum as it enters colon which is call volvulus. It does not appear to be a full 360 degree twist and does not appear to be obstructing bowel lumen or mesentery. -bowel rest -will continue to observe -patient to be admitted for productive cough  De BlanchLuke Aaron Kinsinger 03/24/2018, 3:26 AM

## 2018-03-24 NOTE — Progress Notes (Signed)
She was admitted early this morning after midnight and H&P has been reviewed and I am in current agreement with assessment and plan done by Dr. are shod Toniann FailKakrakandy.  Additional changes the plan of care been made accordingly.  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  She states that she is been admitting abdominal pain for last few months which is been persistent and has had nausea and vomiting at times.  Last bowel movement was a few days ago.  She also states that she is having a productive sputum for the last few days and exhibiting fevers and chills.  CT of the abdomen was done which showed a possible volvulus at the ileum and general surgery was consulted.  She is started on empiric antibiotics for a likely pneumonia as well given her clinical picture.  He is being treated for the following she is being treated for the following:  Abdominal Pain with possible Volvulus  -Appreciate general surgery consult.   -Patient n.p.o. for now and General Surgery recommending continuing Bowel rest and observation -Per General Surgery does not appear to be a full 360 degree twist and does not be obstructing the bowel lumen or mesentery -Further recommendations per general surgery.  Community-Acquired pneumonia r/o COPD Exacerbation -Patient's symptoms are consistent with community-acquired pneumonia though chest x-ray and CAT scan does not show any definite infiltrates though does show some basilar atelectasis.   -Follow sputum cultures  -influenza PCR Negative so will D/C Droplet Precautions -Urine for Legionella and strep antigen -Could also be from Aspiration. -Check SLP when ok to advance diet  -C/w antibiotic coverage with IV Rocephin as well as IV azithromycin.  She was given IV  cefepime in the ED and IV vancomycin as well as IV metronidazole -We will start p.o. guaifenesin 1200 mg twice daily and discontinue guaifenesin-dextromethorphan 10 mL's every 8 PRN for cough -Check Respiratory Virus Panel -We will also add flutter valve and incentive spirometer -Was given a liter of normal saline bolus in the ED and placed on maintenance IV fluids at 75 mL/hr -Scheduled DuoNeb 3 mL's every 6h  Lactic Acidosis -Improved and lactate level went from 1.9 and is now 1.45 -Continue IV fluid hydration as above  Mildly elevated AST -Continue monitor and trend liver functions repeat CMP in a.m. -If continues to be elevated will check right upper quadrant ultrasound as well as acute hepatitis panel  Hypothyroidism  -Iatrogenic given postoperatively  -Keep patient IV Synthroid 44 mcg until patient can take orally.  Hyperlipidemia  -Continue home medications of Simvastatin when patient can take orally.  Mild Hyperglycemia  -Continue to Follow metabolic panel if still elevated may check hemoglobin A1c.   Hypokalemia -Mild at 3.4 -Replete with IV potassium chloride 40 mEq -Continue monitor replete as necessary -Repeat CMP in a.m.  History of Glaucoma -Continue latanoprost 1 drop left eye nightly  GERD -Continue with pantoprazole IV 40 mg every 24 hours and changed to p.o. Protonix when able to tolerate p.o.  We will continue to monitor patient's clinical response to intervention and repeat blood work in the a.m. as well as imaging studies.

## 2018-03-25 ENCOUNTER — Inpatient Hospital Stay (HOSPITAL_COMMUNITY): Payer: Medicare Other

## 2018-03-25 DIAGNOSIS — J9601 Acute respiratory failure with hypoxia: Secondary | ICD-10-CM

## 2018-03-25 DIAGNOSIS — R945 Abnormal results of liver function studies: Secondary | ICD-10-CM

## 2018-03-25 DIAGNOSIS — J205 Acute bronchitis due to respiratory syncytial virus: Secondary | ICD-10-CM

## 2018-03-25 DIAGNOSIS — J121 Respiratory syncytial virus pneumonia: Secondary | ICD-10-CM

## 2018-03-25 LAB — URINALYSIS, ROUTINE W REFLEX MICROSCOPIC
Bilirubin Urine: NEGATIVE
Glucose, UA: NEGATIVE mg/dL
Ketones, ur: 20 mg/dL — AB
Nitrite: NEGATIVE
PROTEIN: 30 mg/dL — AB
Specific Gravity, Urine: 1.015 (ref 1.005–1.030)
pH: 5 (ref 5.0–8.0)

## 2018-03-25 LAB — COMPREHENSIVE METABOLIC PANEL
ALT: 46 U/L — ABNORMAL HIGH (ref 0–44)
ANION GAP: 9 (ref 5–15)
AST: 52 U/L — ABNORMAL HIGH (ref 15–41)
Albumin: 2.7 g/dL — ABNORMAL LOW (ref 3.5–5.0)
Alkaline Phosphatase: 39 U/L (ref 38–126)
BUN: 8 mg/dL (ref 8–23)
CO2: 26 mmol/L (ref 22–32)
Calcium: 8.1 mg/dL — ABNORMAL LOW (ref 8.9–10.3)
Chloride: 106 mmol/L (ref 98–111)
Creatinine, Ser: 0.75 mg/dL (ref 0.44–1.00)
GFR calc non Af Amer: >60 mL/min (ref >60)
Glucose, Bld: 103 mg/dL — ABNORMAL HIGH (ref 70–99)
Potassium: 3.4 mmol/L — ABNORMAL LOW (ref 3.5–5.1)
SODIUM: 141 mmol/L (ref 135–145)
Total Bilirubin: 0.9 mg/dL (ref 0.3–1.2)
Total Protein: 5.6 g/dL — ABNORMAL LOW (ref 6.5–8.1)

## 2018-03-25 LAB — CBC WITH DIFFERENTIAL/PLATELET
Abs Immature Granulocytes: 0.06 10*3/uL (ref 0.00–0.07)
Basophils Absolute: 0 10*3/uL (ref 0.0–0.1)
Basophils Relative: 0 %
Eosinophils Absolute: 0 10*3/uL (ref 0.0–0.5)
Eosinophils Relative: 0 %
HCT: 35.2 % — ABNORMAL LOW (ref 36.0–46.0)
Hemoglobin: 11.8 g/dL — ABNORMAL LOW (ref 12.0–15.0)
Immature Granulocytes: 0 %
LYMPHS PCT: 10 %
Lymphs Abs: 1.5 10*3/uL (ref 0.7–4.0)
MCH: 31.7 pg (ref 26.0–34.0)
MCHC: 33.5 g/dL (ref 30.0–36.0)
MCV: 94.6 fL (ref 80.0–100.0)
Monocytes Absolute: 1.2 10*3/uL — ABNORMAL HIGH (ref 0.1–1.0)
Monocytes Relative: 8 %
Neutro Abs: 11.8 10*3/uL — ABNORMAL HIGH (ref 1.7–7.7)
Neutrophils Relative %: 82 %
Platelets: 130 10*3/uL — ABNORMAL LOW (ref 150–400)
RBC: 3.72 MIL/uL — AB (ref 3.87–5.11)
RDW: 13.2 % (ref 11.5–15.5)
WBC: 14.6 10*3/uL — AB (ref 4.0–10.5)
nRBC: 0 % (ref 0.0–0.2)

## 2018-03-25 LAB — EXPECTORATED SPUTUM ASSESSMENT W GRAM STAIN, RFLX TO RESP C

## 2018-03-25 LAB — MAGNESIUM: Magnesium: 1.9 mg/dL (ref 1.7–2.4)

## 2018-03-25 LAB — PHOSPHORUS: Phosphorus: 1.6 mg/dL — ABNORMAL LOW (ref 2.5–4.6)

## 2018-03-25 MED ORDER — PANTOPRAZOLE SODIUM 40 MG PO TBEC
40.0000 mg | DELAYED_RELEASE_TABLET | Freq: Every day | ORAL | Status: DC
  Administered 2018-03-25 – 2018-03-31 (×7): 40 mg via ORAL
  Filled 2018-03-25 (×7): qty 1

## 2018-03-25 MED ORDER — POTASSIUM CHLORIDE 10 MEQ/100ML IV SOLN
INTRAVENOUS | Status: AC
  Filled 2018-03-25: qty 100

## 2018-03-25 MED ORDER — HEPARIN SODIUM (PORCINE) 5000 UNIT/ML IJ SOLN
5000.0000 [IU] | Freq: Three times a day (TID) | INTRAMUSCULAR | Status: DC
  Administered 2018-03-25 – 2018-03-31 (×18): 5000 [IU] via SUBCUTANEOUS
  Filled 2018-03-25 (×17): qty 1

## 2018-03-25 MED ORDER — BUDESONIDE 0.25 MG/2ML IN SUSP
0.2500 mg | Freq: Two times a day (BID) | RESPIRATORY_TRACT | Status: DC
  Administered 2018-03-25 – 2018-03-31 (×12): 0.25 mg via RESPIRATORY_TRACT
  Filled 2018-03-25 (×12): qty 2

## 2018-03-25 MED ORDER — POTASSIUM CHLORIDE IN NACL 20-0.9 MEQ/L-% IV SOLN
INTRAVENOUS | Status: DC
  Administered 2018-03-25 (×2): via INTRAVENOUS
  Filled 2018-03-25 (×2): qty 1000

## 2018-03-25 MED ORDER — POTASSIUM PHOSPHATES 15 MMOLE/5ML IV SOLN
20.0000 mmol | Freq: Once | INTRAVENOUS | Status: AC
  Administered 2018-03-25: 20 mmol via INTRAVENOUS
  Filled 2018-03-25: qty 6.67

## 2018-03-25 MED ORDER — METHYLPREDNISOLONE SODIUM SUCC 40 MG IJ SOLR
40.0000 mg | Freq: Two times a day (BID) | INTRAMUSCULAR | Status: DC
  Administered 2018-03-25 (×2): 40 mg via INTRAVENOUS
  Filled 2018-03-25 (×2): qty 1

## 2018-03-25 MED ORDER — LEVOTHYROXINE SODIUM 88 MCG PO TABS
88.0000 ug | ORAL_TABLET | Freq: Every day | ORAL | Status: DC
  Administered 2018-03-25 – 2018-03-31 (×7): 88 ug via ORAL
  Filled 2018-03-25 (×7): qty 1

## 2018-03-25 MED ORDER — SODIUM CHLORIDE 0.9 % IV BOLUS
500.0000 mL | Freq: Once | INTRAVENOUS | Status: AC
  Administered 2018-03-25: 500 mL via INTRAVENOUS

## 2018-03-25 MED ORDER — POTASSIUM CHLORIDE 10 MEQ/100ML IV SOLN
10.0000 meq | INTRAVENOUS | Status: AC
  Administered 2018-03-25 (×4): 10 meq via INTRAVENOUS
  Filled 2018-03-25 (×3): qty 100

## 2018-03-25 MED ORDER — LEVOTHYROXINE SODIUM 88 MCG PO TABS: 88.0000 ug | ORAL_TABLET | Freq: Every day | ORAL | Status: DC

## 2018-03-25 MED ORDER — SIMVASTATIN 20 MG PO TABS: 20.0000 mg | ORAL_TABLET | Freq: Every day | ORAL | Status: DC

## 2018-03-25 NOTE — Progress Notes (Signed)
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  She states that she is been admitting abdominal pain for last few months which is been persistent and has had nausea and vomiting at times.  Last bowel movement was a few days ago.  She also states that she is having a productive sputum for the last few days and exhibiting fevers and chills.  CT of the abdomen was done which showed a possible volvulus at the ileum and general surgery was consulted.  She is started on empiric antibiotics for a likely pneumonia as well given her clinical picture  Assessment & Plan:   Principal Problem:   SBO (small bowel obstruction) (HCC) Active Problems:   Hypothyroidism   Hypercholesterolemia   CAP (community acquired pneumonia)  Abdominal Pain with possible Volvulus, improving -Appreciate general surgery consult.  -Patient n.p.o. for now and General Surgery recommending continuing Bowel rest and observation but since Abdomen is soft and she had a BM yesterday General Surgery has advanced diet to a CLD with hopes to advance to a Soft Diet in AM -Per General Surgery does not appear to be a full 360 degree twist and does not be obstructing the bowel lumen or mesentery -Repeat KUB pending to be done -Further recommendations per general surgery.  Community-Acquired pneumonia r/o COPD Exacerbation and Bronchitis -Patient's symptoms are consistent with community-acquired pneumonia though chest x-ray and CAT scan does not show any definite infiltrates though does show some basilar atelectasis.  -Repeat CXR pending  -Follow sputum cultures  -Influenza PCR Negative  but Respiratory Virus Panel Positive for RSV -Urine for Legionella Ag pending and strep antigen Negative -Could also be from Aspiration. -Check SLP when ok to advance diet; General Surgery recommending CLD -C/w antibiotic coverage with IV Rocephin as well as IV azithromycin for now.  She was given IV cefepime in the ED and IV vancomycin as well as IV metronidazole and these have been stopped -C/w p.o. guaifenesin 1200 mg twice daily and discontinue guaifenesin-dextromethorphan 10 mL's every 8 PRN for cough -We will also add flutter valve and incentive spirometer -Was given a liter of normal saline bolus in the ED and placed on maintenance IV fluids at 75 mL/hr + 20 mEQ of KCl -Scheduled DuoNeb 3 mL's every 6h -Will add Budesonide 0.25 mg BID to regimen as well as Methylprednisolone 40 mg q12h  Acute Respiratory Failure with Hypoxia -In the setting as above -Continuous pulse oximetry and maintain O2 saturation greater than 90% -Continue with supplemental oxygen via nasal cannula and wean O2 as tolerated -Treatment as above and will need a home Ambulatory screen prior to discharge  Lactic Acidosis -Improved and lactate level went from 1.9 and is now 1.45 -Continue IV fluid hydration as above  Abnormal LFT's -? Reactive -AST went from 50 -> 52 and ALT went from 27 -> 46 -Check RUQ U/S and Acute Hepatitis Panel -Continue to Hold Simvastatin -Continue to Monitor and Trend CMP  Hypothyroidism  -Iatrogenic given postoperatively  -Keep patient IV Synthroid 44 mcg until patient can take orally and change later today so she can get po in AM   Hyperlipidemia  -Continue home medications of Simvastatin when patient can take orally  and LFTs are improved  Mild Hyperglycemia -Continue to Follow metabolic panel if still elevated may check hemoglobin A1c.  -Blood Sugar on CMP/BMP ranging from 103-150  Hypokalemia -Mild at 3.4 -Replete with IV potassium chloride 40 mEq again and replete  with IV K-Phos 20 mmol -Continue monitor replete as necessary -Repeat CMP in a.m.  Hypophosphatemia -Phosthis morning was 1.6 -Replete with IV KPhos 20 mmol  -Continue to monitor and replete as necessary -Repeat Phosphorous level in the a.m.  History of Glaucoma -Continue Latanoprost 1 drop left eye nightly  GERD -Continue with pantoprazole IV 40 mg every 24 hours and changed to p.o. Protonix when able to tolerate p.o.  DVT prophylaxis: Heparin 5,000 units sq q8h Code Status: FULL CODE Family Communication: No family present at bedside Disposition Plan: Remain Inpatient for continued treatment and D/C Home when respiratory status improves and able to fully take po  Consultants:   General Surgery  Procedures:   None   Antimicrobials:  Anti-infectives (From admission, onward)   Start     Dose/Rate Route Frequency Ordered Stop   03/24/18 1000  azithromycin (ZITHROMAX) 500 mg in sodium chloride 0.9 % 250 mL IVPB     500 mg 250 mL/hr over 60 Minutes Intravenous Every 24 hours 03/24/18 0439 03/31/18 0959   03/24/18 0800  cefTRIAXone (ROCEPHIN) 1 g in sodium chloride 0.9 % 100 mL IVPB     1 g 200 mL/hr over 30 Minutes Intravenous Every 24 hours 03/24/18 0439 03/31/18 0759   03/24/18 0145  vancomycin (VANCOCIN) 1,500 mg in sodium chloride 0.9 % 500 mL IVPB     1,500 mg 250 mL/hr over 120 Minutes Intravenous  Once 03/24/18 0131 03/24/18 0501   03/24/18 0130  ceFEPIme (MAXIPIME) 2 g in sodium chloride 0.9 % 100 mL IVPB     2 g 200 mL/hr over 30 Minutes Intravenous  Once 03/24/18 0127 03/24/18 0234   03/24/18 0130  metroNIDAZOLE (FLAGYL) IVPB 500 mg     500 mg 100 mL/hr over 60 Minutes Intravenous Every 8 hours 03/24/18 0127     03/24/18 0130  vancomycin (VANCOCIN) IVPB 1000 mg/200 mL premix  Status:  Discontinued     1,000 mg 200 mL/hr over 60 Minutes Intravenous  Once 03/24/18 0127 03/24/18 0130     Subjective: States breathing was a little bit better and she is not  coughing as much.  Still states that she has a "kitten purring in her chest".  States she slept okay.  No nausea or vomiting.  States she is able to cough up significant amount of phlegm.  No other concerns or complaints at this time  Objective: Vitals:   03/24/18 2048 03/25/18 0500 03/25/18 0616 03/25/18 0918  BP: 121/66  133/71   Pulse: 92  87   Resp: 20  18   Temp: 99.3 F (37.4 C)  98.7 F (37.1 C)   TempSrc: Oral  Oral   SpO2: 92%  90% 91%  Weight:  70.1 kg    Height:        Intake/Output Summary (Last 24 hours) at 03/25/2018 0958 Last data filed at 03/25/2018 0600 Gross per 24 hour  Intake 437.14 ml  Output 100 ml  Net 337.14 ml   Filed Weights   03/24/18 0204 03/24/18 1342 03/25/18 0500  Weight: 70.3 kg 69 kg 70.1 kg   Examination: Physical Exam:  Constitutional: WN/WD Caucasian female in NAD and appears calm and comfortable Eyes: Lids and conjunctivae normal, sclerae anicteric  ENMT:  External Ears, Nose appear normal. Grossly normal hearing. Mucous membranes are moist. Neck: Appears normal, supple, no cervical masses, normal ROM, no appreciable thyromegaly; no JVD Respiratory: Diminished to auscultation bilaterally with coarse breath sounds and some wheezing. No appreciable rales, rhonchi or crackles. Normal respiratory effort and patient is not tachypenic. No accessory muscle use. Wearing supplemental O2 via [place] Cardiovascular: RRR, no murmurs / rubs / gallops. S1 and S2 auscultated. No extremity edema.  Abdomen: Soft, non-tender, non-distended. No masses palpated. No appreciable hepatosplenomegaly. Bowel sounds positive x4.  GU: Deferred. Musculoskeletal: No clubbing / cyanosis of digits/nails. No joint deformity upper and lower extremities.  Skin: No rashes, lesions, ulcers on a limited skin evaluation. No induration; Warm and dry.  Neurologic: CN 2-12 grossly intact with no focal deficits.  Romberg sign and cerebellar reflexes not assessed.  Psychiatric: Normal  judgment and insight. Alert and oriented x 3. Normal mood and appropriate affect.   Data Reviewed: I have personally reviewed following labs and imaging studies  CBC: Recent Labs  Lab 03/23/18 2220 03/24/18 0450 03/25/18 0236  WBC 9.5 9.3 14.6*  NEUTROABS  --  8.7* 11.8*  HGB 13.8 11.7* 11.8*  HCT 41.7 36.2 35.2*  MCV 93.5 96.0 94.6  PLT 155 131* 130*   Basic Metabolic Panel: Recent Labs  Lab 03/23/18 2220 03/24/18 0450 03/25/18 0236  NA 137 139 141  K 3.3* 3.4* 3.4*  CL 101 105 106  CO2 23 23 26   GLUCOSE 150* 150* 103*  BUN 8 6* 8  CREATININE 0.82 0.81 0.75  CALCIUM 8.9 7.6* 8.1*  MG  --   --  1.9  PHOS  --   --  1.6*   GFR: Estimated Creatinine Clearance: 50 mL/min (by C-G formula based on SCr of 0.75 mg/dL). Liver Function Tests: Recent Labs  Lab 03/23/18 2220 03/24/18 0450 03/25/18 0236  AST 37 50* 52*  ALT 23 27 46*  ALKPHOS 53 42 39  BILITOT 1.3* 0.8 0.9  PROT 7.1 5.7* 5.6*  ALBUMIN 3.6 3.0* 2.7*   Recent Labs  Lab 03/23/18 2220  LIPASE 19   No results for input(s): AMMONIA in the last 168 hours. Coagulation Profile: No results for input(s): INR, PROTIME in the last 168 hours. Cardiac Enzymes: No results for input(s): CKTOTAL, CKMB, CKMBINDEX, TROPONINI in the last 168 hours. BNP (last 3 results) No results for input(s): PROBNP in the last 8760 hours. HbA1C: No results for input(s): HGBA1C in the last 72 hours. CBG: No results for input(s): GLUCAP in the last 168 hours. Lipid Profile: No results for input(s): CHOL, HDL, LDLCALC, TRIG, CHOLHDL, LDLDIRECT in the last 72 hours. Thyroid Function Tests: No results for input(s): TSH, T4TOTAL, FREET4, T3FREE, THYROIDAB in the last 72 hours. Anemia Panel: No results for input(s): VITAMINB12, FOLATE, FERRITIN, TIBC, IRON, RETICCTPCT in the last 72 hours. Sepsis Labs: Recent Labs  Lab 03/24/18 0040 03/24/18 0302  LATICACIDVEN 1.99* 1.45    Recent Results (from the past 240 hour(s))    Respiratory Panel by PCR     Status: Abnormal   Collection Time: 03/24/18 10:28 AM  Result Value Ref Range Status   Adenovirus NOT DETECTED NOT DETECTED Final   Coronavirus 229E NOT DETECTED NOT DETECTED Final   Coronavirus HKU1 NOT DETECTED NOT DETECTED Final   Coronavirus NL63 NOT DETECTED NOT DETECTED Final   Coronavirus OC43 NOT DETECTED NOT DETECTED Final   Metapneumovirus NOT DETECTED NOT DETECTED Final   Rhinovirus / Enterovirus NOT DETECTED NOT DETECTED Final  Influenza A NOT DETECTED NOT DETECTED Final   Influenza B NOT DETECTED NOT DETECTED Final   Parainfluenza Virus 1 NOT DETECTED NOT DETECTED Final   Parainfluenza Virus 2 NOT DETECTED NOT DETECTED Final   Parainfluenza Virus 3 NOT DETECTED NOT DETECTED Final   Parainfluenza Virus 4 NOT DETECTED NOT DETECTED Final   Respiratory Syncytial Virus DETECTED (A) NOT DETECTED Final    Comment: CRITICAL RESULT CALLED TO, READ BACK BY AND VERIFIED WITH: K HARTGROVE RN 03/24/18 2111 JDW    Bordetella pertussis NOT DETECTED NOT DETECTED Final   Chlamydophila pneumoniae NOT DETECTED NOT DETECTED Final   Mycoplasma pneumoniae NOT DETECTED NOT DETECTED Final    Comment: Performed at [facility], [address] [place], [place] [address]  Culture, sputum-assessment     Status: None   Collection Time: 03/24/18  9:47 PM  Result Value Ref Range Status   Specimen Description EXPECTORATED SPUTUM  Final   Special Requests NONE  Final   Sputum evaluation   Final    THIS SPECIMEN IS ACCEPTABLE FOR SPUTUM CULTURE Performed at [facility], [address] [place], [place] [address]    Report Status 03/25/2018 FINAL  Final  Culture, respiratory     Status: None (Preliminary result)   Collection Time: 03/24/18  9:47 PM  Result Value Ref Range Status   Specimen Description EXPECTORATED SPUTUM  Final   Special Requests NONE Reflexed from [id number]  Final   Gram Stain   Final    ABUNDANT WBC PRESENT,BOTH PMN AND  MONONUCLEAR MODERATE GRAM POSITIVE COCCI MODERATE GRAM VARIABLE ROD Performed at [facility], [address], [place], [place] [address]    Culture PENDING  Incomplete   Report Status PENDING  Incomplete    Radiology Studies: Dg Chest 2 View  Result Date: 03/23/2018 CLINICAL DATA:  Cough, short of breath EXAM: CHEST - 2 VIEW COMPARISON:  08/07/2017 FINDINGS: Trace pleural effusions. Mild cardiomegaly with vascular congestion. Tortuous calcified aorta. Patchy atelectasis at the left base. No pneumothorax. IMPRESSION: 1. Cardiomegaly with vascular congestion and trace pleural effusion 2. Patchy atelectasis at the left base Electronically Signed   By: Jasmine Pang M.D.   On: 03/23/2018 23:54   Ct Abdomen Pelvis W Contrast  Result Date: 03/24/2018 CLINICAL DATA:  Acute onset of right lower quadrant abdominal pain and nausea. EXAM: CT ABDOMEN AND PELVIS WITH CONTRAST TECHNIQUE: Multidetector CT imaging of the abdomen and pelvis was performed using the standard protocol following bolus administration of intravenous contrast. CONTRAST:  OMNIPAQUE IOHEXOL 300 MG/ML  SOLN COMPARISON:  CT of the abdomen and pelvis performed 07/01/2017 FINDINGS: Lower chest: Mild bibasilar opacities likely reflect atelectasis. The visualized portions of the mediastinum are unremarkable. Hepatobiliary: The liver is unremarkable in appearance. The patient is status post cholecystectomy, with clips noted at the gallbladder fossa. The common bile duct remains normal in caliber. Pancreas: The pancreas is within normal limits. Spleen: The spleen is unremarkable in appearance. Adrenals/Urinary Tract: The adrenal glands are unremarkable in appearance. Scattered bilateral renal cysts are seen. There is no evidence of hydronephrosis. No renal or ureteral stones are identified. No perinephric stranding is appreciated. Stomach/Bowel: The stomach is unremarkable in appearance. There is unusual mild focal twisting of the  distal ileum and ileocecal junction at the right lower quadrant, which may correspond to the patient's symptoms. There is no evidence for bowel obstruction at this time. The appendix is normal in caliber, without evidence of appendicitis. The colon  is unremarkable in appearance. Vascular/Lymphatic: Scattered calcification is seen along the abdominal aorta and its branches. The abdominal aorta is otherwise grossly unremarkable. The inferior vena cava is grossly unremarkable. No retroperitoneal lymphadenopathy is seen. No pelvic sidewall lymphadenopathy is identified. Reproductive: The bladder is mildly distended and grossly unremarkable. The patient is status post hysterectomy. No suspicious adnexal masses are seen. The right ovary is grossly unremarkable in appearance. Other: No additional soft tissue abnormalities are seen. Musculoskeletal: No acute osseous abnormalities are identified. Multilevel vacuum phenomenon is noted along the lower thoracic and lumbar spine. The visualized musculature is unremarkable in appearance. IMPRESSION: 1. Unusual mild focal twisting of the distal ileum and ileocecal junction at the right lower quadrant, suggesting mild volvulus, which may correspond to the patient's symptoms. No evidence for bowel obstruction at this time. 2. Scattered bilateral renal cysts. 3. Mild bibasilar airspace opacities likely reflect atelectasis. Aortic Atherosclerosis (ICD10-I70.0). Electronically Signed   By: Roanna RaiderJeffery  Chang M.D.   On: 03/24/2018 00:28   Scheduled Meds: . guaiFENesin  1,200 mg Oral BID  . ipratropium-albuterol  3 mL Nebulization TID  . latanoprost  1 drop Left Eye QHS  . levothyroxine  44 mcg Intravenous Daily  . pantoprazole (PROTONIX) IV  40 mg Intravenous Q24H   Continuous Infusions: . 0.9 % NaCl with KCl 20 mEq / L    . azithromycin Stopped (03/24/18 1131)  . cefTRIAXone (ROCEPHIN)  IV 1 g (03/25/18 0847)  . metronidazole 500 mg (03/25/18 0218)  . potassium chloride    .  potassium PHOSPHATE IVPB (in mmol)      LOS: 1 day   Merlene Laughtermair Latif , DO [facility] PAGER is on AMION  If 7PM-7AM, please contact night-coverage [email or web address] Password [email or web address] 03/25/2018, 9:58 AM

## 2018-03-25 NOTE — Progress Notes (Signed)
Pt has only voided 150cc today.  Urine is amber colored and odorous.  MD notified.  New orders received.  Will carry out and continue to monitor.  Jordan Horne [place]

## 2018-03-25 NOTE — Progress Notes (Signed)
SLP Cancellation Note  Patient Details Name: Jordan Horne MRN: [id number] DOB: 1932-08-26   Cancelled treatment:       Reason Eval/Treat Not Completed: Other (comment)(Clear liquid diet today, per MD preference for bedside swallow evaluation tomorrow: 1/2); SLP to follow up    Noelle Pennerhelsea E Temprence Rhines MA, CCC-SLP Acute Rehab Speech Language Pathologist  03/25/2018, 10:38 AM

## 2018-03-25 NOTE — Progress Notes (Signed)
Subjective: Positive for RSV.  Feels a little short of breath at times.  No abdominal pain.  Had a large BM yesterday after admission.  No nausea.    Objective: Vital signs in last 24 hours: Temp:  [98.7 F (37.1 C)-102.2 F (39 C)] 98.7 F (37.1 C) (01/01 0616) Pulse Rate:  [87-118] 87 (01/01 0616) Resp:  [18-26] 18 (01/01 0616) BP: (98-138)/(59-98) 133/71 (01/01 0616) SpO2:  [88 %-99 %] 91 % (01/01 0918) Weight:  [69 kg-70.1 kg] 70.1 kg (01/01 0500) Last BM Date: 03/23/18  Intake/Output from previous day: 12/31 0701 - 01/01 0700 In: 437.1 [I.V.:46; IV Piggyback:391.2] Out: 100 [Urine:100] Intake/Output this shift: No intake/output data recorded.  PE: Gen: NAD Abd: soft, NT, ND, +BS  Lab Results:  Recent Labs    03/24/18 0450 03/25/18 0236  WBC 9.3 14.6*  HGB 11.7* 11.8*  HCT 36.2 35.2*  PLT 131* 130*   BMET Recent Labs    03/24/18 0450 03/25/18 0236  NA 139 141  K 3.4* 3.4*  CL 105 106  CO2 23 26  GLUCOSE 150* 103*  BUN 6* 8  CREATININE 0.81 0.75  CALCIUM 7.6* 8.1*   PT/INR No results for input(s): LABPROT, INR in the last 72 hours. CMP     Component Value Date/Time   NA 141 03/25/2018 0236   K 3.4 (L) 03/25/2018 0236   CL 106 03/25/2018 0236   CO2 26 03/25/2018 0236   GLUCOSE 103 (H) 03/25/2018 0236   BUN 8 03/25/2018 0236   CREATININE 0.75 03/25/2018 0236   CALCIUM 8.1 (L) 03/25/2018 0236   PROT 5.6 (L) 03/25/2018 0236   ALBUMIN 2.7 (L) 03/25/2018 0236   AST 52 (H) 03/25/2018 0236   ALT 46 (H) 03/25/2018 0236   ALKPHOS 39 03/25/2018 0236   BILITOT 0.9 03/25/2018 0236   GFRNONAA >60 03/25/2018 0236   GFRAA >60 03/25/2018 0236   Lipase     Component Value Date/Time   LIPASE 19 03/23/2018 2220       Studies/Results: Dg Chest 2 View  Result Date: 03/23/2018 CLINICAL DATA:  Cough, short of breath EXAM: CHEST - 2 VIEW COMPARISON:  08/07/2017 FINDINGS: Trace pleural effusions. Mild cardiomegaly with vascular congestion.  Tortuous calcified aorta. Patchy atelectasis at the left base. No pneumothorax. IMPRESSION: 1. Cardiomegaly with vascular congestion and trace pleural effusion 2. Patchy atelectasis at the left base Electronically Signed   By: Jasmine PangKim  Fujinaga M.D.   On: 03/23/2018 23:54   Ct Abdomen Pelvis W Contrast  Result Date: 03/24/2018 CLINICAL DATA:  Acute onset of right lower quadrant abdominal pain and nausea. EXAM: CT ABDOMEN AND PELVIS WITH CONTRAST TECHNIQUE: Multidetector CT imaging of the abdomen and pelvis was performed using the standard protocol following bolus administration of intravenous contrast. CONTRAST:  100mL OMNIPAQUE IOHEXOL 300 MG/ML  SOLN COMPARISON:  CT of the abdomen and pelvis performed 07/01/2017 FINDINGS: Lower chest: Mild bibasilar opacities likely reflect atelectasis. The visualized portions of the mediastinum are unremarkable. Hepatobiliary: The liver is unremarkable in appearance. The patient is status post cholecystectomy, with clips noted at the gallbladder fossa. The common bile duct remains normal in caliber. Pancreas: The pancreas is within normal limits. Spleen: The spleen is unremarkable in appearance. Adrenals/Urinary Tract: The adrenal glands are unremarkable in appearance. Scattered bilateral renal cysts are seen. There is no evidence of hydronephrosis. No renal or ureteral stones are identified. No perinephric stranding is appreciated. Stomach/Bowel: The stomach is unremarkable in appearance. There is  unusual mild focal twisting of the distal ileum and ileocecal junction at the right lower quadrant, which may correspond to the patient's symptoms. There is no evidence for bowel obstruction at this time. The appendix is normal in caliber, without evidence of appendicitis. The colon is unremarkable in appearance. Vascular/Lymphatic: Scattered calcification is seen along the abdominal aorta and its branches. The abdominal aorta is otherwise grossly unremarkable. The inferior vena cava  is grossly unremarkable. No retroperitoneal lymphadenopathy is seen. No pelvic sidewall lymphadenopathy is identified. Reproductive: The bladder is mildly distended and grossly unremarkable. The patient is status post hysterectomy. No suspicious adnexal masses are seen. The right ovary is grossly unremarkable in appearance. Other: No additional soft tissue abnormalities are seen. Musculoskeletal: No acute osseous abnormalities are identified. Multilevel vacuum phenomenon is noted along the lower thoracic and lumbar spine. The visualized musculature is unremarkable in appearance. IMPRESSION: 1. Unusual mild focal twisting of the distal ileum and ileocecal junction at the right lower quadrant, suggesting mild volvulus, which may correspond to the patient's symptoms. No evidence for bowel obstruction at this time. 2. Scattered bilateral renal cysts. 3. Mild bibasilar airspace opacities likely reflect atelectasis. Aortic Atherosclerosis (ICD10-I70.0). Electronically Signed   By: Roanna RaiderJeffery  Chang M.D.   On: 03/24/2018 00:28    Anti-infectives: Anti-infectives (From admission, onward)   Start     Dose/Rate Route Frequency Ordered Stop   03/24/18 1000  azithromycin (ZITHROMAX) 500 mg in sodium chloride 0.9 % 250 mL IVPB     500 mg 250 mL/hr over 60 Minutes Intravenous Every 24 hours 03/24/18 0439 03/31/18 0959   03/24/18 0800  cefTRIAXone (ROCEPHIN) 1 g in sodium chloride 0.9 % 100 mL IVPB     1 g 200 mL/hr over 30 Minutes Intravenous Every 24 hours 03/24/18 0439 03/31/18 0759   03/24/18 0145  vancomycin (VANCOCIN) 1,500 mg in sodium chloride 0.9 % 500 mL IVPB     1,500 mg 250 mL/hr over 120 Minutes Intravenous  Once 03/24/18 0131 03/24/18 0501   03/24/18 0130  ceFEPIme (MAXIPIME) 2 g in sodium chloride 0.9 % 100 mL IVPB     2 g 200 mL/hr over 30 Minutes Intravenous  Once 03/24/18 0127 03/24/18 0234   03/24/18 0130  metroNIDAZOLE (FLAGYL) IVPB 500 mg     500 mg 100 mL/hr over 60 Minutes Intravenous Every  8 hours 03/24/18 0127     03/24/18 0130  vancomycin (VANCOCIN) IVPB 1000 mg/200 mL premix  Status:  Discontinued     1,000 mg 200 mL/hr over 60 Minutes Intravenous  Once 03/24/18 0127 03/24/18 0130       Assessment/Plan RSV   cecal / ileal volvulus -benign abdominal exam and patient moving her bowels. -start clear liquids today and adv to soft diet in am for breakfast if tolerates clears today. -hopefully can avoid surgery in setting of acute respiratory issues.  FEN - clear liquids, adv to soft diet in am if doing well VTE - ok for prophylaxis from our standpoint ID - per primary   LOS: 1 day    Letha CapeKelly E Syesha Thaw , [facility] 03/25/2018, 9:31 AM Pager: [phone]

## 2018-03-25 NOTE — Plan of Care (Signed)
  Problem: Activity: Goal: Risk for activity intolerance will decrease Outcome: Progressing   Problem: Coping: Goal: Level of anxiety will decrease Outcome: Progressing   Problem: Pain Managment: Goal: General experience of comfort will improve Outcome: Progressing   Problem: Safety: Goal: Ability to remain free from injury will improve Outcome: Progressing   Problem: Skin Integrity: Goal: Risk for impaired skin integrity will decrease Outcome: Progressing   

## 2018-03-26 ENCOUNTER — Inpatient Hospital Stay (HOSPITAL_COMMUNITY): Payer: Medicare Other

## 2018-03-26 DIAGNOSIS — E890 Postprocedural hypothyroidism: Secondary | ICD-10-CM

## 2018-03-26 LAB — HEPATITIS PANEL, ACUTE
HCV Ab: <0.1 s/co ratio (ref 0.0–0.9)
Hep A IgM: NEGATIVE
Hep B C IgM: NEGATIVE
Hepatitis B Surface Ag: NEGATIVE

## 2018-03-26 LAB — COMPREHENSIVE METABOLIC PANEL
ALT: 36 U/L (ref 0–44)
AST: 33 U/L (ref 15–41)
Albumin: 2.6 g/dL — ABNORMAL LOW (ref 3.5–5.0)
Alkaline Phosphatase: 49 U/L (ref 38–126)
Anion gap: 10 (ref 5–15)
BUN: 8 mg/dL (ref 8–23)
CO2: 25 mmol/L (ref 22–32)
Calcium: 8.1 mg/dL — ABNORMAL LOW (ref 8.9–10.3)
Chloride: 106 mmol/L (ref 98–111)
Creatinine, Ser: 0.69 mg/dL (ref 0.44–1.00)
GFR calc Af Amer: >60 mL/min (ref >60)
GFR calc non Af Amer: >60 mL/min (ref >60)
Glucose, Bld: 162 mg/dL — ABNORMAL HIGH (ref 70–99)
POTASSIUM: 3.5 mmol/L (ref 3.5–5.1)
Sodium: 141 mmol/L (ref 135–145)
Total Bilirubin: 0.9 mg/dL (ref 0.3–1.2)
Total Protein: 5.9 g/dL — ABNORMAL LOW (ref 6.5–8.1)

## 2018-03-26 LAB — CBC WITH DIFFERENTIAL/PLATELET
Abs Immature Granulocytes: 0.1 10*3/uL — ABNORMAL HIGH (ref 0.00–0.07)
BASOS ABS: 0 10*3/uL (ref 0.0–0.1)
Basophils Relative: 0 %
Eosinophils Absolute: 0 10*3/uL (ref 0.0–0.5)
Eosinophils Relative: 0 %
HCT: 36.8 % (ref 36.0–46.0)
Hemoglobin: 12.2 g/dL (ref 12.0–15.0)
Immature Granulocytes: 1 %
Lymphocytes Relative: 6 %
Lymphs Abs: 0.8 10*3/uL (ref 0.7–4.0)
MCH: 31.6 pg (ref 26.0–34.0)
MCHC: 33.2 g/dL (ref 30.0–36.0)
MCV: 95.3 fL (ref 80.0–100.0)
Monocytes Absolute: 0.3 10*3/uL (ref 0.1–1.0)
Monocytes Relative: 3 %
NEUTROS ABS: 11.6 10*3/uL — AB (ref 1.7–7.7)
Neutrophils Relative %: 90 %
Platelets: 154 10*3/uL (ref 150–400)
RBC: 3.86 MIL/uL — ABNORMAL LOW (ref 3.87–5.11)
RDW: 13.3 % (ref 11.5–15.5)
WBC: 12.8 10*3/uL — ABNORMAL HIGH (ref 4.0–10.5)
nRBC: 0 % (ref 0.0–0.2)

## 2018-03-26 LAB — URINE CULTURE: Culture: NO GROWTH

## 2018-03-26 LAB — LEGIONELLA PNEUMOPHILA SEROGP 1 UR AG: L. pneumophila Serogp 1 Ur Ag: NEGATIVE

## 2018-03-26 LAB — PHOSPHORUS: Phosphorus: 1.4 mg/dL — ABNORMAL LOW (ref 2.5–4.6)

## 2018-03-26 LAB — MAGNESIUM: Magnesium: 2 mg/dL (ref 1.7–2.4)

## 2018-03-26 MED ORDER — POTASSIUM CHLORIDE IN NACL 20-0.9 MEQ/L-% IV SOLN
INTRAVENOUS | Status: AC
  Administered 2018-03-26: 11:00:00 via INTRAVENOUS

## 2018-03-26 MED ORDER — POTASSIUM PHOSPHATES 15 MMOLE/5ML IV SOLN
30.0000 mmol | Freq: Once | INTRAVENOUS | Status: AC
  Administered 2018-03-26: 30 mmol via INTRAVENOUS
  Filled 2018-03-26: qty 10

## 2018-03-26 MED ORDER — DIAZEPAM 5 MG PO TABS
5.0000 mg | ORAL_TABLET | Freq: Once | ORAL | Status: AC
  Administered 2018-03-26: 5 mg via ORAL
  Filled 2018-03-26: qty 1

## 2018-03-26 MED ORDER — METHYLPREDNISOLONE SODIUM SUCC 125 MG IJ SOLR
60.0000 mg | Freq: Three times a day (TID) | INTRAMUSCULAR | Status: DC
  Administered 2018-03-26 – 2018-03-28 (×6): 60 mg via INTRAVENOUS
  Filled 2018-03-26 (×6): qty 2

## 2018-03-26 MED ORDER — POTASSIUM CHLORIDE CRYS ER 20 MEQ PO TBCR
40.0000 meq | EXTENDED_RELEASE_TABLET | Freq: Once | ORAL | Status: AC
  Administered 2018-03-26: 40 meq via ORAL
  Filled 2018-03-26: qty 2

## 2018-03-26 NOTE — Final Consult Note (Signed)
Consultant Final Sign-Off Note    Assessment/Final recommendations  Jordan Horne is a 85 y.o. female followed by me for pSBO.  This appears to have resolved as she is tolerating her diet and moving her bowels.  No further surgical needs.  We will sign off.   Wound care (if applicable):  none  Diet at discharge: soft, low fiber   Activity at discharge: per primary team   Follow-up appointment:  Follow up with PCP as needed.   Pending results:  Unresulted Labs (From admission, onward)    Start     Ordered   03/25/18 1645  Culture, Urine  Once,   R     03/25/18 1644   03/25/18 1006  Hepatitis panel, acute  Add-on,   R     03/25/18 1005   03/24/18 0452  Legionella Pneumophila Serogp 1 Ur Ag  Add-on,   R     03/24/18 0452           Medication recommendations: none by Jordan   Other recommendations: none    Thank you for allowing Jordan to participate in the care of your patient!  Please consult Jordan again if you have further needs for your patient.  [name] 03/26/2018 8:33 AM    Subjective  Patient feels well.  Coughing up a little bit of blood. Tolerating clears with no issues.  Ready for solid food.  Had another small BM.  No nausea   Objective  Vital signs in last 24 hours: Temp:  [97.9 F (36.6 C)-98.6 F (37 C)] 97.9 F (36.6 C) (01/02 0604) Pulse Rate:  [92-101] 92 (01/02 0604) Resp:  [18-19] 18 (01/02 0604) BP: (123-139)/(67-95) 139/87 (01/02 0604) SpO2:  [88 %-95 %] 95 % (01/02 0604)  Abd: soft, NT, ND, +BS   Pertinent labs and Studies: Recent Labs    03/24/18 0450 03/25/18 0236 03/26/18 0149  WBC 9.3 14.6* 12.8*  HGB 11.7* 11.8* 12.2  HCT 36.2 35.2* 36.8   BMET Recent Labs    03/25/18 0236 03/26/18 0149  NA 141 141  K 3.4* 3.5  CL 106 106  CO2 26 25  GLUCOSE 103* 162*  BUN 8 8  CREATININE 0.75 0.69  CALCIUM 8.1* 8.1*   No results for input(s): LABURIN in the last 72 hours. Results for orders placed or performed during the  hospital encounter of 03/23/18  Blood culture (routine x 2)     Status: None (Preliminary result)   Collection Time: 03/23/18 10:15 PM  Result Value Ref Range Status   Specimen Description BLOOD LEFT ARM  Final   Special Requests   Final    BOTTLES DRAWN AEROBIC AND ANAEROBIC Blood Culture adequate volume   Culture   Final    NO GROWTH 1 DAY Performed at [facility], [address] [place], [place] [address]    Report Status PENDING  Incomplete  Blood culture (routine x 2)     Status: None (Preliminary result)   Collection Time: 03/24/18 12:32 AM  Result Value Ref Range Status   Specimen Description BLOOD RIGHT HAND  Final   Special Requests   Final    BOTTLES DRAWN AEROBIC ONLY Blood Culture results may not be optimal due to an excessive volume of blood received in culture bottles   Culture   Final    NO GROWTH 1 DAY Performed at [facility], [address], [place], [place] [address]    Report Status PENDING  Incomplete  Respiratory Panel by PCR     Status: Abnormal   Collection Time: 03/24/18 10:28 AM  Result Value Ref Range Status   Adenovirus NOT DETECTED NOT DETECTED Final   Coronavirus 229E NOT DETECTED NOT DETECTED Final   Coronavirus HKU1 NOT DETECTED NOT DETECTED Final   Coronavirus NL63 NOT DETECTED NOT DETECTED Final   Coronavirus OC43 NOT DETECTED NOT DETECTED Final   Metapneumovirus NOT DETECTED NOT DETECTED Final   Rhinovirus / Enterovirus NOT DETECTED NOT DETECTED Final   Influenza A NOT DETECTED NOT DETECTED Final   Influenza B NOT DETECTED NOT DETECTED Final   Parainfluenza Virus 1 NOT DETECTED NOT DETECTED Final   Parainfluenza Virus 2 NOT DETECTED NOT DETECTED Final   Parainfluenza Virus 3 NOT DETECTED NOT DETECTED Final   Parainfluenza Virus 4 NOT DETECTED NOT DETECTED Final   Respiratory Syncytial Virus DETECTED (A) NOT DETECTED Final    Comment: CRITICAL RESULT CALLED TO, READ BACK BY AND VERIFIED WITH: K HARTGROVE RN 03/24/18 2111 JDW     Bordetella pertussis NOT DETECTED NOT DETECTED Final   Chlamydophila pneumoniae NOT DETECTED NOT DETECTED Final   Mycoplasma pneumoniae NOT DETECTED NOT DETECTED Final    Comment: Performed at [facility], [address], [place], [place] [address]  Culture, sputum-assessment     Status: None   Collection Time: 03/24/18  9:47 PM  Result Value Ref Range Status   Specimen Description EXPECTORATED SPUTUM  Final   Special Requests NONE  Final   Sputum evaluation   Final    THIS SPECIMEN IS ACCEPTABLE FOR SPUTUM CULTURE Performed at [facility], [address], [place], [place] [address]    Report Status 03/25/2018 FINAL  Final  Culture, respiratory     Status: None (Preliminary result)   Collection Time: 03/24/18  9:47 PM  Result Value Ref Range Status   Specimen Description EXPECTORATED SPUTUM  Final   Special Requests NONE Reflexed from [id number]  Final   Gram Stain   Final    ABUNDANT WBC PRESENT,BOTH PMN AND MONONUCLEAR MODERATE GRAM POSITIVE COCCI MODERATE GRAM VARIABLE ROD Performed at [facility], [address], [place], [place] [address]    Culture PENDING  Incomplete   Report Status PENDING  Incomplete    Imaging: Dg Chest Port 1 View  Result Date: 03/26/2018 CLINICAL DATA:  Shortness of breath. EXAM: PORTABLE CHEST 1 VIEW COMPARISON:  Radiograph September 23, 2018. FINDINGS: Stable cardiomegaly. Atherosclerosis of thoracic aorta is noted. No pneumothorax is noted. Stable faint opacity seen in right upper lobe which may represent developing pneumonia. Minimal left basilar subsegmental atelectasis may be present. Bony thorax is unremarkable. IMPRESSION: Stable faint right upper lobe opacity as described above. Aortic Atherosclerosis (ICD10-I70.0). Electronically Signed   By: Lupita Raider, M.D.   On: 03/26/2018 08:04   Jordan Horne  Result Date: 03/25/2018 CLINICAL DATA:  85 year old with elevated liver function tests. Surgical history includes  cholecystectomy. EXAM: ULTRASOUND ABDOMEN LIMITED RIGHT UPPER QUADRANT COMPARISON:  No prior ultrasound. CT abdomen and pelvis 03/23/2018, 07/01/2017. FINDINGS: Gallbladder: Surgically absent. Common bile duct: Diameter: Approximately 3 mm. Liver: Normal size and echotexture without focal parenchymal abnormality. Portal vein is patent on color Doppler imaging with normal direction of blood flow towards the liver. IMPRESSION: Normal examination post cholecystectomy. Electronically Signed   By: Hulan Saas M.D.   On: 03/25/2018 20:22

## 2018-03-26 NOTE — Evaluation (Signed)
Clinical/Bedside Swallow Evaluation Patient Details  Name: Jordan Horne MRN: [id number] Date of Birth: 1933-03-06  Today's Date: 03/26/2018 Time: SLP Start Time (ACUTE ONLY): 1030 SLP Stop Time (ACUTE ONLY): 1045 SLP Time Calculation (min) (ACUTE ONLY): 15 min  Past Medical History:  Past Medical History:  Diagnosis Date  . Glaucoma   . Hypertension   . Thyroid disease    Past Surgical History:  Past Surgical History:  Procedure Laterality Date  . ABDOMINAL HYSTERECTOMY    . 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 Pt had SBO now respolved.  Pt has clinical presentation c/w pneumonia.  CXR 1/2 "Stable faint opacity seen in right upper lobe which may represent developing pneumonia."   Assessment / Plan / Recommendation Clinical Impression  Pt presents with functional swallowing as assessed clinically.  Pt tolerated all consistencies trialed with no clinical s/s of aspiration and exhibited good oral clearance of solid. Pt reports feeling that food won't go down and has been evaluated in past.  She reports unremarkable upper endoscopy in [place].  Pt takes reflux medication PRN.  Although there were no symptoms during today's assessement, suspect these may be d/t esophageal dysphagia/dysmotility. Pt may benefit from barium swallow if symptoms persist.  Recommend advancing pt to regular diet with thin liquid as medically appropriate following SBO. SLP Visit Diagnosis: Dysphagia, unspecified (R13.10)    Aspiration Risk  No limitations    Diet Recommendation Regular;Thin liquid(when medically appropriate)   Liquid Administration via: Cup;Straw Medication Administration: Whole meds with liquid Supervision: Patient able to self  feed Compensations: Minimize environmental distractions;Follow solids with liquid;Slow rate;Small sips/bites Postural Changes: Seated upright at 90 degrees;Remain upright for at least 30 minutes after po intake    Other  Recommendations Recommended Consults: Consider esophageal assessment Oral Care Recommendations: Oral care BID   Follow up Recommendations None        Swallow Study   General HPI: The patient is in 85 year old Caucasian female with a past medical history significant for 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 Pt had SBO now respolved.  Pt has clinical presentation c/w pneumonia.  CXR 1/2 "Stable faint opacity seen in right upper lobe which may represent developing pneumonia." Type of Study: Bedside Swallow Evaluation Diet Prior to this Study: (Soft) History of Recent Intubation: No Behavior/Cognition: Alert;Cooperative;Pleasant mood Oral Cavity Assessment: Within Functional Limits Oral Cavity - Dentition: Adequate natural dentition Vision: Functional for self-feeding Self-Feeding Abilities: Able to feed self Patient Positioning: Upright in bed Baseline Vocal Quality: Normal Volitional Cough: Strong Volitional Swallow: Able to elicit    Oral/Motor/Sensory Function Overall Oral Motor/Sensory Function: Within functional limits   Ice Chips Ice chips: Not tested   Thin Liquid Thin Liquid: Within functional limits    Nectar Thick Nectar Thick Liquid: Not tested   Honey Thick Honey Thick Liquid: Not tested   Puree Puree: Within functional limits   Solid     Solid: Within functional limits      Kerrie Pleasure, MA, CCC-SLP Acute Rehabilitation Services Office: [phone]; Pager (1/2) [phone] 03/26/2018,12:21 PM

## 2018-03-26 NOTE — Evaluation (Signed)
Physical Therapy Evaluation Patient Details Name: Jordan Horne MRN: [id number] DOB: 02-17-1933 Today's Date: 03/26/2018   History of Present Illness  The patient is in 85 year old Caucasian female with a past medical history hypothyroidism,  hyperlipidemia, glaucoma, hypertension, who presented to the ED 03/24/18 with a chief complaint of abdominal pain. Pt had SBO now respolved.  Pt has clinical presentation c/w pneumonia.  Popsitive for respiratoy syncytial virus  Clinical Impression  The patient is pleasantly confused. Required multimodal cues for mobility. Assisted to [facility], then to recliner. Patient's O2 saturation on RA for activity was 85%. Replaced at 4 liters [place] with sats back to >90%. No family present, prior level of function unknown and DC plan unknown. Pt admitted with above diagnosis. Pt currently with functional limitations due to the deficits listed below (see PT Problem List).  Pt will benefit from skilled PT to increase their independence and safety with mobility to allow discharge to the venue listed below.       Follow Up Recommendations SNF, no family present to discuss DC plans/caregivers    Equipment Recommendations  (TBD)    Recommendations for Other Services       Precautions / Restrictions Precautions Precaution Comments: droplet, monitor O 2 sats      Mobility  Bed Mobility Overal bed mobility: Needs Assistance Bed Mobility: Supine to Sit     Supine to sit: Supervision        Transfers Overall transfer level: Needs assistance Equipment used: 1 person hand held assist Transfers: Sit to/from Stand;Stand Pivot Transfers Sit to Stand: Min assist Stand pivot transfers: Min assist       General transfer comment: multimodal cues to transfer to BSc then tio recliner. Patient self distracting, speaking about spouse falling off of the ladder, reports having a bad dream that the Russians were invading.  Ambulation/Gait                Stairs             Wheelchair Mobility    Modified Rankin (Stroke Patients Only)       Balance Overall balance assessment: Needs assistance Sitting-balance support: Feet supported;No upper extremity supported Sitting balance-Leahy Scale: Good     Standing balance support: During functional activity;Single extremity supported Standing balance-Leahy Scale: Fair                               Pertinent Vitals/Pain Pain Assessment: No/denies pain Faces Pain Scale: No hurt    Home Living Family/patient expects to be discharged to:: Private residence Living Arrangements: Spouse/significant other Available Help at Discharge: Family             Additional Comments: unable to gather prior function and caregivers due to confusion    Prior Function                 Hand Dominance        Extremity/Trunk Assessment   Upper Extremity Assessment Upper Extremity Assessment: Overall WFL for tasks assessed    Lower Extremity Assessment Lower Extremity Assessment: Generalized weakness    Cervical / Trunk Assessment Cervical / Trunk Assessment: Normal  Communication   Communication: No difficulties  Cognition Arousal/Alertness: Awake/alert Behavior During Therapy: WFL for tasks assessed/performed Overall Cognitive Status: No family/caregiver present to determine baseline cognitive functioning Area of Impairment: Orientation;Following commands;Safety/judgement;Awareness                 Orientation  Level: Place;Time;Situation     Following Commands: Follows one step commands with increased time Safety/Judgement: Decreased awareness of safety     General Comments: patient unaware of needing BM until assisted to [facility]      General Comments      Exercises     Assessment/Plan    PT Assessment Patient needs continued PT services  PT Problem List Decreased strength;Cardiopulmonary status limiting activity;Decreased cognition;Decreased activity  tolerance;Decreased knowledge of use of DME;Decreased mobility;Decreased knowledge of precautions;Decreased safety awareness       PT Treatment Interventions DME instruction;Gait training;Functional mobility training;Therapeutic activities;Patient/family education;Cognitive remediation    PT Goals (Current goals can be found in the Care Plan section)  Acute Rehab PT Goals Patient Stated Goal: agreed to going to [facility] PT Goal Formulation: Patient unable to participate in goal setting Time For Goal Achievement: 04/09/18 Potential to Achieve Goals: Good    Frequency Min 2X/week   Barriers to discharge        Co-evaluation               AM-PAC PT "6 Clicks" Mobility  Outcome Measure Help needed turning from your back to your side while in a flat bed without using bedrails?: A Little Help needed moving from lying on your back to sitting on the side of a flat bed without using bedrails?: A Little Help needed moving to and from a bed to a chair (including a wheelchair)?: A Lot Help needed standing up from a chair using your arms (e.g., wheelchair or bedside chair)?: A Lot Help needed to walk in hospital room?: Total Help needed climbing 3-5 steps with a railing? : Total 6 Click Score: 12    End of Session Equipment Utilized During Treatment: Gait belt Activity Tolerance: Patient tolerated treatment well Patient left: in chair;with call bell/phone within reach;with nursing/sitter in room Nurse Communication: Mobility status PT Visit Diagnosis: Unsteadiness on feet (R26.81)    Time: 1324-4010 PT Time Calculation (min) (ACUTE ONLY): 20 min   Charges:   PT Evaluation $PT Eval Low Complexity: 1 Low          Blanchard Kelch PT Acute Rehabilitation Services Pager [phone] Office [phone]   Rada Hay 03/26/2018, 1:11 PM

## 2018-03-26 NOTE — Progress Notes (Signed)
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  She states that she is been admitting abdominal pain for last few months which is been persistent and has had nausea and vomiting at times.  Last bowel movement was a few days ago.  She also states that she is having a productive sputum for the last few days and exhibiting fevers and chills.  CT of the abdomen was done which showed a possible volvulus at the ileum and general surgery was consulted.  She is started on empiric antibiotics for a likely pneumonia as well given her clinical picture  Assessment & Plan:   Principal Problem:   SBO (small bowel obstruction) (HCC) Active Problems:   Hypothyroidism   Hypercholesterolemia   CAP (community acquired pneumonia)  Abdominal Pain with possible Volvulus, improved -Appreciate general surgery consult.  -Patient was NPO and General Surgery was recommending continuing Bowel rest and observation but since Abdomen is soft and she had a BM the day before yesterday General Surgery advanced diet to a CLD yesterday and to a Soft Diet this AM -Per General Surgery does not appear to be a full 360 degree twist and does not be obstructing the bowel lumen or mesentery -Repeat KUB showed No evidence of bowel obstruction or ileus. -Further recommendations per general surgery.  Community-Acquired pneumonia r/o COPD Exacerbation and Bronchitis -Patient's symptoms are consistent with community-acquired pneumonia though chest x-ray and CAT scan does not show any definite infiltrates though does show some basilar atelectasis.  -Follow sputum cultures  -Influenza PCR  Negative but Respiratory Virus Panel Positive for RSV -Urine for Legionella Ag pending and strep antigen Negative -Could also be from Aspiration. -Check SLP when ok to advance diet; General Surgery recommended CLD but will go to Soft today and have SLP Evaluate -C/w antibiotic coverage with IV Rocephin as well as IV azithromycin for now.  She was given IV cefepime in the ED and IV vancomycin as well as IV metronidazole and these have been stopped -C/w p.o. guaifenesin 1200 mg twice daily and discontinue guaifenesin-dextromethorphan 10 mL's every 8 PRN for cough -C/w flutter valve and incentive spirometer -Was given a liter of normal saline bolus in the ED and placed on maintenance IV fluids at 75 mL/hr + 20 mEQ of KCl and will D/C IVF later today after 10 more hours -WBC went from 9.3 -> 14.6 -> 12.8  -Scheduled DuoNeb 3 mL's every 6h -Will add Budesonide 0.25 mg BID to regimen as well as Methylprednisolone 40 mg q12h but will increase to 60 mg q8h as she is still rhonchous and wheezing significantly  -Repeat CXR this AM showed Stable faint right upper lobe opacity as described above. Aortic Atherosclerosis. -Will get PT/OT to evaluate and Treat  Acute Respiratory Failure with Hypoxia -In the setting as above -Continuous pulse oximetry and maintain O2 saturation greater than 90% -Continue with supplemental oxygen via nasal cannula and wean O2 as tolerated -Treatment as above and will need a home Ambulatory screen prior to discharge  Lactic Acidosis -Improved and lactate level went from 1.9 and is now 1.45 -Continue IV fluid hydration as above  Abnormal LFT's, now improved -? Reactive -AST went from 50 -> 52 ->  33  and ALT went from 27 -> 46 -> 36 -Check RUQ U/S and Acute Hepatitis Panel -RUQ showed Normal examination post cholecystectomy. -Acute Hepatitis Panel Pending  -Continue to Hold Simvastatin for now -Continue to Monitor and Trend CMP  Hypothyroidism  -Iatrogenic given  postoperatively  -C/w po Levothyroxine 88 mcg Daily  Hyperlipidemia  -Continue home medications of Simvastatin when patient can take orally and LFTs are improved and stable; Resume in AM  Mild Hyperglycemia -Continue to Follow metabolic panel if still elevated may check hemoglobin A1c.  -Blood Sugar on CMP/BMP ranging from 103-162 -Continue to Monitor and place on Sensitive Novolog SSI if Blood Sugars are consistently elevated  Hypokalemia -Mild at 3.4 and is now 3.5 -Replete with potassium chloride 40 mEq and replete with IV K-Phos 30 mmol -Continue monitor replete as necessary -Repeat CMP in a.m.  Hypophosphatemia -Phos this morning was 1.4 -Replete with IV KPhos 30 mmol  -Continue to monitor and replete as necessary -Repeat Phosphorous level in the a.m.  History of Glaucoma -Continue Latanoprost 1 drop left eye nightly  GERD -Continue with po Pantoprazole 40 mg po Daily   DVT prophylaxis: Heparin 5,000 units sq q8h Code Status: FULL CODE Family Communication: No family present at bedside Disposition Plan: Remain Inpatient for continued treatment and D/C Home when respiratory status improves and able to fully take po; She still remains on O2 and will attempt to wean  Consultants:   General Surgery (signed off)  Procedures:   None   Antimicrobials:  Anti-infectives (From admission, onward)   Start     Dose/Rate Route Frequency Ordered Stop   03/24/18 1000  azithromycin (ZITHROMAX) 500 mg in sodium chloride 0.9 % 250 mL IVPB     500 mg 250 mL/hr over 60 Minutes Intravenous Every 24 hours 03/24/18 0439 03/31/18 0959   03/24/18 0800  cefTRIAXone (ROCEPHIN) 1 g in sodium chloride 0.9 % 100 mL IVPB     1 g 200 mL/hr over 30 Minutes Intravenous Every 24 hours 03/24/18 0439 03/31/18 0759   03/24/18 0145  vancomycin (VANCOCIN) 1,500 mg in sodium chloride 0.9 % 500 mL IVPB     1,500 mg 250 mL/hr over 120 Minutes Intravenous  Once 03/24/18 0131 03/24/18 0501    03/24/18 0130  ceFEPIme (MAXIPIME) 2 g in sodium chloride 0.9 % 100 mL IVPB     2 g 200 mL/hr over 30 Minutes Intravenous  Once 03/24/18 0127 03/24/18 0234   03/24/18 0130  metroNIDAZOLE (FLAGYL) IVPB 500 mg     500 mg 100 mL/hr over 60 Minutes Intravenous Every 8 hours 03/24/18 0127     03/24/18 0130  vancomycin (VANCOCIN) IVPB 1000 mg/200 mL premix  Status:  Discontinued     1,000 mg 200 mL/hr over 60 Minutes Intravenous  Once 03/24/18 0127 03/24/18 0130     Subjective: Seen and examined at bedside and states she still feels a rattling in her chest.  She is not coughing up some blood-tinged sputum.  Still has some dyspnea and shortness of breath.  Denies any abdominal pain now and had another bowel movement overnight.  Has some chest pain associated with coughing.  Has no urinary complaints or concerns.  Nursing was concerned about safety as she continues to try to get out of bed but when I spoke with her she states that she will remain in bed now.  Objective: Vitals:   03/25/18 2047 03/25/18 2129 03/26/18 0239 03/26/18 0604  BP:  123/67  139/87  Pulse:  Marland Kitchen)  101  92  Resp:  19  18  Temp:  98.6 F (37 C)  97.9 F (36.6 C)  TempSrc:    Oral  SpO2: 94% 90% (!) 89% 95%  Weight:      Height:        Intake/Output Summary (Last 24 hours) at 03/26/2018 0942 Last data filed at 03/26/2018 0426 Gross per 24 hour  Intake 1270 ml  Output 725 ml  Net 545 ml   Filed Weights   03/24/18 0204 03/24/18 1342 03/25/18 0500  Weight: 70.3 kg 69 kg 70.1 kg   Examination: Physical Exam:  Constitutional: Well-nourished, well-developed Caucasian female currently no acute distress appears calm and comfortable at bedside but still complaining of some wheezing or shortness of breath Eyes: Sclerae anicteric.  Lids and conjunctive are normal ENMT: External ears and nose appear normal.  Grossly normal hearing.  Mucous members are moist Neck: Appears supple no JVD Respiratory: Diminished auscultation  bilaterally with coarse breath sounds and some wheezing and rhonchi.  Still wearing supplemental oxygen via nasal cannula and has a productive of blood-tinged sputum.  Able to speak in full sentences and not dyspneic on conversation Cardiovascular: Regular rate and rhythm.  No appreciable murmurs, rubs, gallops.  No lower extremity edema Abdomen: Soft, nontender, nondistended.  Bowel sounds present GU: Deferred Musculoskeletal: No contractures or cyanosis.  No joint deformities noted in the upper and lower extremities Skin: Is warm and dry no appreciable rashes or lesions limited skin evaluation Neurologic: Cranial Nerves II through XII grossly intact no appreciable focal deficits Psychiatric: Normal judgment and insight.  Patient is awake and alert and oriented x3  Data Reviewed: I have personally reviewed following labs and imaging studies  CBC: Recent Labs  Lab 03/23/18 2220 03/24/18 0450 03/25/18 0236 03/26/18 0149  WBC 9.5 9.3 14.6* 12.8*  NEUTROABS  --  8.7* 11.8* 11.6*  HGB 13.8 11.7* 11.8* 12.2  HCT 41.7 36.2 35.2* 36.8  MCV 93.5 96.0 94.6 95.3  PLT 155 131* 130* 154   Basic Metabolic Panel: Recent Labs  Lab 03/23/18 2220 03/24/18 0450 03/25/18 0236 03/26/18 0149  NA 137 139 141 141  K 3.3* 3.4* 3.4* 3.5  CL 101 105 106 106  CO2 23 23 26 25   GLUCOSE 150* 150* 103* 162*  BUN 8 6* 8 8  CREATININE 0.82 0.81 0.75 0.69  CALCIUM 8.9 7.6* 8.1* 8.1*  MG  --   --  1.9 2.0  PHOS  --   --  1.6* 1.4*   GFR: Estimated Creatinine Clearance: 50 mL/min (by C-G formula based on SCr of 0.69 mg/dL). Liver Function Tests: Recent Labs  Lab 03/23/18 2220 03/24/18 0450 03/25/18 0236 03/26/18 0149  AST 37 50* 52* 33  ALT 23 27 46* 36  ALKPHOS 53 42 39 49  BILITOT 1.3* 0.8 0.9 0.9  PROT 7.1 5.7* 5.6* 5.9*  ALBUMIN 3.6 3.0* 2.7* 2.6*   Recent Labs  Lab 03/23/18 2220  LIPASE 19   No results for input(s): AMMONIA in the last 168 hours. Coagulation Profile: No results for  input(s): INR, PROTIME in the last 168 hours. Cardiac Enzymes: No results for input(s): CKTOTAL, CKMB, CKMBINDEX, TROPONINI in the last 168 hours. BNP (last 3 results) No results for input(s): PROBNP in the last 8760 hours. HbA1C: No results for input(s): HGBA1C in the last 72 hours. CBG: No results for input(s): GLUCAP in the last 168 hours. Lipid Profile: No results for input(s): CHOL, HDL, LDLCALC, TRIG, CHOLHDL, LDLDIRECT in  the last 72 hours. Thyroid Function Tests: No results for input(s): TSH, T4TOTAL, FREET4, T3FREE, THYROIDAB in the last 72 hours. Anemia Panel: No results for input(s): VITAMINB12, FOLATE, FERRITIN, TIBC, IRON, RETICCTPCT in the last 72 hours. Sepsis Labs: Recent Labs  Lab 03/24/18 0040 03/24/18 0302  LATICACIDVEN 1.99* 1.45    Recent Results (from the past 240 hour(s))  Blood culture (routine x 2)     Status: None (Preliminary result)   Collection Time: 03/23/18 10:15 PM  Result Value Ref Range Status   Specimen Description BLOOD LEFT ARM  Final   Special Requests   Final    BOTTLES DRAWN AEROBIC AND ANAEROBIC Blood Culture adequate volume   Culture   Final    NO GROWTH 1 DAY Performed at [facility], [address] [place], [place] [address]    Report Status PENDING  Incomplete  Blood culture (routine x 2)     Status: None (Preliminary result)   Collection Time: 03/24/18 12:32 AM  Result Value Ref Range Status   Specimen Description BLOOD RIGHT HAND  Final   Special Requests   Final    BOTTLES DRAWN AEROBIC ONLY Blood Culture results may not be optimal due to an excessive volume of blood received in culture bottles   Culture   Final    NO GROWTH 1 DAY Performed at [facility], [address] [place], [place] [address]    Report Status PENDING  Incomplete  Respiratory Panel by PCR     Status: Abnormal   Collection Time: 03/24/18 10:28 AM  Result Value Ref Range Status   Adenovirus NOT DETECTED NOT DETECTED Final   Coronavirus  229E NOT DETECTED NOT DETECTED Final   Coronavirus HKU1 NOT DETECTED NOT DETECTED Final   Coronavirus NL63 NOT DETECTED NOT DETECTED Final   Coronavirus OC43 NOT DETECTED NOT DETECTED Final   Metapneumovirus NOT DETECTED NOT DETECTED Final   Rhinovirus / Enterovirus NOT DETECTED NOT DETECTED Final   Influenza A NOT DETECTED NOT DETECTED Final   Influenza B NOT DETECTED NOT DETECTED Final   Parainfluenza Virus 1 NOT DETECTED NOT DETECTED Final   Parainfluenza Virus 2 NOT DETECTED NOT DETECTED Final   Parainfluenza Virus 3 NOT DETECTED NOT DETECTED Final   Parainfluenza Virus 4 NOT DETECTED NOT DETECTED Final   Respiratory Syncytial Virus DETECTED (A) NOT DETECTED Final    Comment: CRITICAL RESULT CALLED TO, READ BACK BY AND VERIFIED WITH: K HARTGROVE RN 03/24/18 2111 JDW    Bordetella pertussis NOT DETECTED NOT DETECTED Final   Chlamydophila pneumoniae NOT DETECTED NOT DETECTED Final   Mycoplasma pneumoniae NOT DETECTED NOT DETECTED Final    Comment: Performed at [facility], [address] [place], [place] [address]  Culture, sputum-assessment     Status: None   Collection Time: 03/24/18  9:47 PM  Result Value Ref Range Status   Specimen Description EXPECTORATED SPUTUM  Final   Special Requests NONE  Final   Sputum evaluation   Final    THIS SPECIMEN IS ACCEPTABLE FOR SPUTUM CULTURE Performed at [facility], [address] [place], [place] [address]    Report Status 03/25/2018 FINAL  Final  Culture, respiratory     Status: None (Preliminary result)   Collection Time: 03/24/18  9:47 PM  Result Value Ref Range Status   Specimen Description EXPECTORATED SPUTUM  Final   Special Requests NONE Reflexed from [id number]  Final   Gram Stain   Final  ABUNDANT WBC PRESENT,BOTH PMN AND MONONUCLEAR MODERATE GRAM POSITIVE COCCI MODERATE GRAM VARIABLE ROD    Culture   Final    CULTURE REINCUBATED FOR BETTER GROWTH Performed at [facility], [address]  [place], [place] [address]    Report Status PENDING  Incomplete    Radiology Studies: Dg Chest Port 1 View  Result Date: 03/26/2018 CLINICAL DATA:  Shortness of breath. EXAM: PORTABLE CHEST 1 VIEW COMPARISON:  Radiograph September 23, 2018. FINDINGS: Stable cardiomegaly. Atherosclerosis of thoracic aorta is noted. No pneumothorax is noted. Stable faint opacity seen in right upper lobe which may represent developing pneumonia. Minimal left basilar subsegmental atelectasis may be present. Bony thorax is unremarkable. IMPRESSION: Stable faint right upper lobe opacity as described above. Aortic Atherosclerosis (ICD10-I70.0). Electronically Signed   By: Lupita RaiderJames  Green Jr, M.D.   On: 03/26/2018 08:04   Dg Chest Port 1 View  Result Date: 03/25/2018 CLINICAL DATA:  Shortness of breath. EXAM: PORTABLE CHEST 1 VIEW COMPARISON:  Radiographs of March 23, 2018. FINDINGS: Stable cardiomegaly. Atherosclerosis of thoracic aorta is noted. No pneumothorax or pleural effusion is noted. Left lung is clear. Mildly increased right upper lobe airspace opacity is noted concerning for developing pneumonia. Bony thorax is unremarkable. IMPRESSION: Mildly increased right upper lobe airspace opacity is noted concerning for developing pneumonia. Aortic Atherosclerosis (ICD10-I70.0). Electronically Signed   By: Lupita RaiderJames  Green Jr, M.D.   On: 03/25/2018 10:02   Dg Abd Portable 1v  Result Date: 03/25/2018 CLINICAL DATA:  Volvulus. EXAM: PORTABLE ABDOMEN - 1 VIEW COMPARISON:  CT scan of March 23, 2018. Radiographs of July 11, 2017. FINDINGS: The bowel gas pattern is normal. Status post cholecystectomy. Phleboliths are noted in the pelvis. IMPRESSION: No evidence of bowel obstruction or ileus. Electronically Signed   By: Lupita RaiderJames  Green Jr, M.D.   On: 03/25/2018 10:04   [place] Abdomen Limited Ruq  Result Date: 03/25/2018 CLINICAL DATA:  85 year old with elevated liver function tests. Surgical history includes cholecystectomy. EXAM: ULTRASOUND ABDOMEN  LIMITED RIGHT UPPER QUADRANT COMPARISON:  No prior ultrasound. CT abdomen and pelvis 03/23/2018, 07/01/2017. FINDINGS: Gallbladder: Surgically absent. Common bile duct: Diameter: Approximately 3 mm. Liver: Normal size and echotexture without focal parenchymal abnormality. Portal vein is patent on color Doppler imaging with normal direction of blood flow towards the liver. IMPRESSION: Normal examination post cholecystectomy. Electronically Signed   By: Hulan Saashomas  Lawrence M.D.   On: 03/25/2018 20:22   Scheduled Meds: . budesonide (PULMICORT) nebulizer solution  0.25 mg Nebulization BID  . guaiFENesin  1,200 mg Oral BID  . heparin injection (subcutaneous)  5,000 Units Subcutaneous Q8H  . ipratropium-albuterol  3 mL Nebulization TID  . latanoprost  1 drop Left Eye QHS  . levothyroxine  88 mcg Oral Q0600  . methylPREDNISolone (SOLU-MEDROL) injection  40 mg Intravenous Q12H  . pantoprazole  40 mg Oral Daily   Continuous Infusions: . 0.9 % NaCl with KCl 20 mEq / L 75 mL/hr at 03/26/18 0426  . azithromycin Stopped (03/25/18 1252)  . cefTRIAXone (ROCEPHIN)  IV 1 g (03/26/18 0812)  . metronidazole Stopped (03/26/18 0258)  . potassium PHOSPHATE IVPB (in mmol)      LOS: 2 days   Merlene Laughtermair Latif Teshawn Moan, DO [facility] PAGER is on AMION  If 7PM-7AM, please contact night-coverage [email or web address] Password [email or web address] 03/26/2018, 9:42 AM

## 2018-03-27 ENCOUNTER — Inpatient Hospital Stay (HOSPITAL_COMMUNITY): Payer: Medicare Other

## 2018-03-27 LAB — COMPREHENSIVE METABOLIC PANEL
ALT: 34 U/L (ref 0–44)
AST: 24 U/L (ref 15–41)
Albumin: 2.8 g/dL — ABNORMAL LOW (ref 3.5–5.0)
Alkaline Phosphatase: 53 U/L (ref 38–126)
Anion gap: 9 (ref 5–15)
BUN: 10 mg/dL (ref 8–23)
CO2: 25 mmol/L (ref 22–32)
Calcium: 8.4 mg/dL — ABNORMAL LOW (ref 8.9–10.3)
Chloride: 109 mmol/L (ref 98–111)
Creatinine, Ser: 0.72 mg/dL (ref 0.44–1.00)
GFR calc Af Amer: >60 mL/min (ref >60)
GFR calc non Af Amer: >60 mL/min (ref >60)
Glucose, Bld: 163 mg/dL — ABNORMAL HIGH (ref 70–99)
Potassium: 3.8 mmol/L (ref 3.5–5.1)
Sodium: 143 mmol/L (ref 135–145)
Total Bilirubin: 0.6 mg/dL (ref 0.3–1.2)
Total Protein: 6.5 g/dL (ref 6.5–8.1)

## 2018-03-27 LAB — CBC WITH DIFFERENTIAL/PLATELET
ABS IMMATURE GRANULOCYTES: 0.35 10*3/uL — AB (ref 0.00–0.07)
Basophils Absolute: 0 10*3/uL (ref 0.0–0.1)
Basophils Relative: 0 %
Eosinophils Absolute: 0 10*3/uL (ref 0.0–0.5)
Eosinophils Relative: 0 %
HCT: 38.4 % (ref 36.0–46.0)
Hemoglobin: 12.5 g/dL (ref 12.0–15.0)
Immature Granulocytes: 2 %
Lymphocytes Relative: 5 %
Lymphs Abs: 1 10*3/uL (ref 0.7–4.0)
MCH: 30.6 pg (ref 26.0–34.0)
MCHC: 32.6 g/dL (ref 30.0–36.0)
MCV: 94.1 fL (ref 80.0–100.0)
MONO ABS: 0.6 10*3/uL (ref 0.1–1.0)
Monocytes Relative: 3 %
Neutro Abs: 16.9 10*3/uL — ABNORMAL HIGH (ref 1.7–7.7)
Neutrophils Relative %: 90 %
Platelets: 198 10*3/uL (ref 150–400)
RBC: 4.08 MIL/uL (ref 3.87–5.11)
RDW: 13.4 % (ref 11.5–15.5)
WBC: 18.9 10*3/uL — ABNORMAL HIGH (ref 4.0–10.5)
nRBC: 0 % (ref 0.0–0.2)

## 2018-03-27 LAB — CULTURE, RESPIRATORY W GRAM STAIN: Culture: NORMAL

## 2018-03-27 LAB — PHOSPHORUS: PHOSPHORUS: 2 mg/dL — AB (ref 2.5–4.6)

## 2018-03-27 LAB — CULTURE, RESPIRATORY

## 2018-03-27 LAB — MAGNESIUM: Magnesium: 2 mg/dL (ref 1.7–2.4)

## 2018-03-27 MED ORDER — NON FORMULARY: 3.0000 mg | Freq: Once | Status: DC

## 2018-03-27 MED ORDER — POTASSIUM PHOSPHATES 15 MMOLE/5ML IV SOLN
20.0000 mmol | Freq: Once | INTRAVENOUS | Status: AC
  Administered 2018-03-27: 20 mmol via INTRAVENOUS
  Filled 2018-03-27 (×2): qty 6.67

## 2018-03-27 MED ORDER — MELATONIN 3 MG PO TABS
3.0000 mg | ORAL_TABLET | Freq: Once | ORAL | Status: AC
  Administered 2018-03-27: 3 mg via ORAL
  Filled 2018-03-27: qty 1

## 2018-03-27 NOTE — Social Work (Signed)
CSW left HIPPA compliant message for Jordan DolinJackie Thall at [phone]. Await return call to discuss pt disposition.  Octavio GravesIsabel Alee Gressman, MSW, [facility] [place] Clinical Social Work [phone]

## 2018-03-27 NOTE — Clinical Social Work Note (Addendum)
Clinical Social Work Assessment  Patient Details  Name: Jordan Horne MRN: [id number] Date of Birth: 05/08/1932  Date of referral:  03/27/18               Reason for consult:  Facility Placement, Discharge Planning                Permission sought to share information with:  Family Supports, Customer service manager Permission granted to share information::  Yes, Verbal Permission Granted  Name::     Jordan Horne  Agency::  SNFs  Relationship::  husband  Contact Information:  [phone]  Housing/Transportation Living arrangements for the past 2 months:  [place] of Information:  Patient Patient Interpreter Needed:  None Criminal Activity/Legal Involvement Pertinent to Current Situation/Hospitalization:  No - Comment as needed Significant Relationships:  Spouse Lives with:  Spouse Do you feel safe going back to the place where you live?  Yes Need for family participation in patient care:  Yes (Comment)  Care giving concerns:  Pt from home with her husband, she states that she also has three sons that assist as able. Currently confused, requiring sitter for redirection. Have not seen family visiting, concern for ability to return home at current time, recommended for SNF.   Social Worker assessment / plan:  CSW met with pt at bedside. Pt confused but relatively oriented. Was able to ask her if she had any support at home- she confirmed her husband Jordan Horne and his phone number as well as self reported assistance from "three sons, they help with the doctors and things." CSW does not have any sons listed on pt facesheet, did obtain permission to speak with her husband Jordan Horne.   SNF currently recommended however will have to be 24 hours without sitter. Pt has been here for 3 night qualifying stay.  Employment status:  Retired Forensic scientist:  Medicare, Other (Comment Required)(Commercial Supplement) PT Recommendations:  [place], [place] / Referral to community resources:  [place]  Patient/Family's Response to care:  Pt pleasant and amenable to speaking with CSW. Did have issues with inconsistency during assessment.  Patient/Family's Understanding of and Emotional Response to Diagnosis, Current Treatment, and Prognosis:  Unclear whether pt understands her diagnosis, current treatment and prognosis. Pt confused but able to converse with CSW and acknowledge her supports. She states she has never been to rehab before. Pt emotionally appropriate throughout assessment.  Emotional Assessment Appearance:  Appears stated age Attitude/Demeanor/Rapport:  Engaged, Gracious, Other(Inconsistent) Affect (typically observed):  Pleasant, Other(Inconsistent) Orientation:  Oriented to Self, Oriented to Place, Fluctuating Orientation (Suspected and/or reported Sundowners) Alcohol / Substance use:  Not Applicable Psych involvement (Current and /or in the community):  No (Comment)  Discharge Needs  Concerns to be addressed:  Care Coordination, Discharge Planning Concerns Readmission within the last 30 days:  No Current discharge risk:  Cognitively Impaired, Physical Impairment Barriers to Discharge:  Continued Medical Work up, Requiring sitter/restraints   Alexander Mt, [place] 03/27/2018, 12:38 PM

## 2018-03-27 NOTE — Progress Notes (Signed)
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  She states that she is been admitting abdominal pain for last few months which is been persistent and has had nausea and vomiting at times.  Last bowel movement was a few days ago.  She also states that she is having a productive sputum for the last few days and exhibiting fevers and chills.  CT of the abdomen was done which showed a possible volvulus at the ileum and general surgery was consulted.  She is started on empiric antibiotics for a Pneumonia and was found to be RSV +.  Assessment & Plan:   Principal Problem:   SBO (small bowel obstruction) (HCC) Active Problems:   Hypothyroidism   Hypercholesterolemia   CAP (community acquired pneumonia)  Abdominal Pain with possible Volvulus, improved -Appreciate general surgery consult.  -Patient was NPO and General Surgery was recommending continuing Bowel rest and observation but since Abdomen is soft and she had a BM the day before yesterday General Surgery advanced diet to a CLD yesterday and to a Soft Diet this AM -Per General Surgery does not appear to be a full 360 degree twist and does not be obstructing the bowel lumen or mesentery -Repeat KUB showed No evidence of bowel obstruction or ileus. -Further recommendations per general surgery.  Community-Acquired pneumonia with likely Concomitant COPD Exacerbation and Bronchitis -Patient's symptoms are consistent with community-acquired pneumonia though chest x-ray and CAT scan does not show any definite infiltrates though does show some basilar atelectasis.  -Follow sputum cultures    -Influenza PCR Negative but Respiratory Virus Panel Positive for RSV -Urine for Legionella Ag pending and strep antigen Negative -Could also be from Aspiration. -Check SLP when ok to advance diet; General Surgery recommended CLD but will go to Soft today and have SLP Evaluate -C/w antibiotic coverage with IV Rocephin as well as IV azithromycin for now.  She was given IV cefepime in the ED and IV vancomycin as well as IV metronidazole and these have been stopped -C/w p.o. guaifenesin 1200 mg twice daily and discontinue guaifenesin-dextromethorphan 10 mL's every 8 PRN for cough -C/w flutter valve and incentive spirometer -IVF is now D/C'd -WBC went from 9.3 -> 14.6 -> 12.8  -Scheduled DuoNeb 3 mL's every 6h -Added Budesonide 0.25 mg BID  -Increased Solumedrol to 60 mg q8h as she was still rhonchous and wheezing significantly yesterday but will cut down to IV 60 mg q12h as some improvement  -Repeat CXR this AM showe Bilateral upper lobe opacities, right greater than left, suspicious for pneumonia. -Will get PT/OT to evaluate and Treat; Recommending SNF -Will need to D/C Safety Sitter so she can go to SNF  Acute Respiratory Failure with Hypoxia -In the setting as above -Continuous pulse oximetry and maintain O2 saturation greater than 90% -Continue with supplemental oxygen via nasal cannula and wean O2 as tolerated -Treatment as above and will need a home Ambulatory screen prior to discharge  Lactic Acidosis -Improved and lactate level went from 1.9 and is now 1.45 -Continue IV fluid hydration as above  Abnormal LFT's, now improved -? Reactive -AST went from 50 -> 52 -> 33 -> 24  and ALT went from 27 ->  46 -> 36 -> 34 -Check RUQ U/S and Acute Hepatitis Panel -RUQ showed Normal examination post cholecystectomy. -Acute Hepatitis Panel Negative -Continue to Hold Simvastatin for now -Continue to Monitor and Trend CMP  Hypothyroidism  -Iatrogenic given postoperatively  -C/w po  Levothyroxine 88 mcg Daily  Hyperlipidemia  -Continue home medications of Simvastatin when patient can take orally and LFTs are improved and stable; -Resume in AM and hold again today   Mild Hyperglycemia -Continue to Follow metabolic panel if still elevated may check hemoglobin A1c.  -Blood Sugar on CMP/BMP ranging from 103-163 -Continue to Monitor and place on Sensitive Novolog SSI if Blood Sugars are consistently elevated  Hypokalemia -Improved to 3.8 -Replete  IV K-Phos 20 mmol  -Continue to monitor and replete as necessary -Repeat CMP in a.m.  Hypophosphatemia -Phos this morning was 2.0 -Replete with IV KPhos 20 mmol  -Continue to monitor and replete as necessary -Repeat Phosphorous level in the a.m.  History of Glaucoma -Continue Latanoprost 1 drop left eye nightly  GERD -Continue with po Pantoprazole 40 mg po Daily   Delirium/Sundowining/Mild Confusion -In the setting of Infection, Hypoxia and Steroid Use -Re-Orient Frequently  -Safety Sitter currently  -Delirium Precautions   Leukocytosis -Worsening in the setting of IV steroid demargination -Continue monitor for signs and symptoms infection -Continue monitor and treat infection as above -Repeat CBC in AM  DVT prophylaxis: Heparin 5,000 units sq q8h Code Status: FULL CODE Family Communication: No family present at bedside Disposition Plan: Remain Inpatient for continued treatment and D/C to SNF when respiratory status improves and able to fully take po; She still remains on O2 and will attempt to wean; Remains intermittently confused  Consultants:   General Surgery (signed off)  Procedures:   None   Antimicrobials:  Anti-infectives (From admission, onward)   Start     Dose/Rate Route Frequency Ordered Stop   03/24/18 1000  azithromycin (ZITHROMAX) 500 mg in sodium chloride 0.9 % 250 mL IVPB     500 mg 250 mL/hr over 60 Minutes Intravenous Every 24 hours 03/24/18 0439 03/31/18 0959   03/24/18  0800  cefTRIAXone (ROCEPHIN) 1 g in sodium chloride 0.9 % 100 mL IVPB     1 g 200 mL/hr over 30 Minutes Intravenous Every 24 hours 03/24/18 0439 03/31/18 0759   03/24/18 0145  vancomycin (VANCOCIN) 1,500 mg in sodium chloride 0.9 % 500 mL IVPB     1,500 mg 250 mL/hr over 120 Minutes Intravenous  Once 03/24/18 0131 03/24/18 0501   03/24/18 0130  ceFEPIme (MAXIPIME) 2 g in sodium chloride 0.9 % 100 mL IVPB     2 g 200 mL/hr over 30 Minutes Intravenous  Once 03/24/18 0127 03/24/18 0234   03/24/18 0130  metroNIDAZOLE (FLAGYL) IVPB 500 mg  Status:  Discontinued     500 mg 100 mL/hr over 60 Minutes Intravenous Every 8 hours 03/24/18 0127 03/26/18 1025   03/24/18 0130  vancomycin (VANCOCIN) IVPB 1000 mg/200 mL premix  Status:  Discontinued     1,000 mg 200 mL/hr over 60 Minutes Intravenous  Once 03/24/18 0127 03/24/18 0130     Subjective: Seen and examined at bedside and was still confused this morning.  Safety sitter at bedside.  Patient states that her breathing is slightly improved than yesterday but still has a lot of rattling in her chest.  No nausea or vomiting.  No other concerns or complaints at this time and no presently at bedside.  Objective: Vitals:   03/27/18 0500 03/27/18  0915 03/27/18 0919 03/27/18 1350  BP:    (!) 147/80  Pulse:  (!) 103  94  Resp:  18  (!) 21  Temp:    98.4 F (36.9 C)  TempSrc:    Oral  SpO2:  90% 91% 92%  Weight: 71 kg     Height:        Intake/Output Summary (Last 24 hours) at 03/27/2018 1455 Last data filed at 03/27/2018 1330 Gross per 24 hour  Intake 2279.29 ml  Output 700 ml  Net 1579.29 ml   Filed Weights   03/24/18 1342 03/25/18 0500 03/27/18 0500  Weight: 69 kg 70.1 kg 71 kg   Examination: Physical Exam:  Constitutional: Well-nourished, well-developed Caucasian female who is currently in no acute distress but is slightly confused Eyes: Sclera anicteric, lids and conjunctive are normal ENMT: External ears and nose appear normal. Neck:  Appears supple no JVD Respiratory: Diminished auscultation bilaterally with some wheezing and coarse breath sounds.  Also has some appreciable rhonchi.  Is unlabored breathing is wearing supplemental oxygen via nasal cannula Cardiovascular: Regular rate and rhythm.  No appreciable murmurs, rubs, gallops Abdomen: Soft, nontender, slightly distended.  Bowel sounds present all 4 quadrants GU: Deferred Musculoskeletal: No contractures or cyanosis on limited skin evaluation Skin: Is warm and dry with no appreciable rashes or lesions limited skin evaluation Neurologic: Cranial nerves II through XII grossly intact no appreciable focal deficits Psychiatric: Confused.  Impaired judgment insight.  She is awake and alert and oriented x2  Data Reviewed: I have personally reviewed following labs and imaging studies  CBC: Recent Labs  Lab 03/23/18 2220 03/24/18 0450 03/25/18 0236 03/26/18 0149 03/27/18 0207  WBC 9.5 9.3 14.6* 12.8* 18.9*  NEUTROABS  --  8.7* 11.8* 11.6* 16.9*  HGB 13.8 11.7* 11.8* 12.2 12.5  HCT 41.7 36.2 35.2* 36.8 38.4  MCV 93.5 96.0 94.6 95.3 94.1  PLT 155 131* 130* 154 198   Basic Metabolic Panel: Recent Labs  Lab 03/23/18 2220 03/24/18 0450 03/25/18 0236 03/26/18 0149 03/27/18 0207  NA 137 139 141 141 143  K 3.3* 3.4* 3.4* 3.5 3.8  CL 101 105 106 106 109  CO2 23 23 26 25 25   GLUCOSE 150* 150* 103* 162* 163*  BUN 8 6* 8 8 10   CREATININE 0.82 0.81 0.75 0.69 0.72  CALCIUM 8.9 7.6* 8.1* 8.1* 8.4*  MG  --   --  1.9 2.0 2.0  PHOS  --   --  1.6* 1.4* 2.0*   GFR: Estimated Creatinine Clearance: 50.3 mL/min (by C-G formula based on SCr of 0.72 mg/dL). Liver Function Tests: Recent Labs  Lab 03/23/18 2220 03/24/18 0450 03/25/18 0236 03/26/18 0149 03/27/18 0207  AST 37 50* 52* 33 24  ALT 23 27 46* 36 34  ALKPHOS 53 42 39 49 53  BILITOT 1.3* 0.8 0.9 0.9 0.6  PROT 7.1 5.7* 5.6* 5.9* 6.5  ALBUMIN 3.6 3.0* 2.7* 2.6* 2.8*   Recent Labs  Lab 03/23/18 2220    LIPASE 19   No results for input(s): AMMONIA in the last 168 hours. Coagulation Profile: No results for input(s): INR, PROTIME in the last 168 hours. Cardiac Enzymes: No results for input(s): CKTOTAL, CKMB, CKMBINDEX, TROPONINI in the last 168 hours. BNP (last 3 results) No results for input(s): PROBNP in the last 8760 hours. HbA1C: No results for input(s): HGBA1C in the last 72 hours. CBG: No results for input(s): GLUCAP in the last 168 hours. Lipid Profile: No results for input(s): CHOL,  HDL, LDLCALC, TRIG, CHOLHDL, LDLDIRECT in the last 72 hours. Thyroid Function Tests: No results for input(s): TSH, T4TOTAL, FREET4, T3FREE, THYROIDAB in the last 72 hours. Anemia Panel: No results for input(s): VITAMINB12, FOLATE, FERRITIN, TIBC, IRON, RETICCTPCT in the last 72 hours. Sepsis Labs: Recent Labs  Lab 03/24/18 0040 03/24/18 0302  LATICACIDVEN 1.99* 1.45    Recent Results (from the past 240 hour(s))  Blood culture (routine x 2)     Status: None (Preliminary result)   Collection Time: 03/23/18 10:15 PM  Result Value Ref Range Status   Specimen Description BLOOD LEFT ARM  Final   Special Requests   Final    BOTTLES DRAWN AEROBIC AND ANAEROBIC Blood Culture adequate volume   Culture   Final    NO GROWTH 3 DAYS Performed at [facility], [address] [place], [place] [address]    Report Status PENDING  Incomplete  Blood culture (routine x 2)     Status: None (Preliminary result)   Collection Time: 03/24/18 12:32 AM  Result Value Ref Range Status   Specimen Description BLOOD RIGHT HAND  Final   Special Requests   Final    BOTTLES DRAWN AEROBIC ONLY Blood Culture results may not be optimal due to an excessive volume of blood received in culture bottles   Culture   Final    NO GROWTH 3 DAYS Performed at [facility], [address] [place], [place] [address]    Report Status PENDING  Incomplete  Respiratory Panel by PCR     Status: Abnormal   Collection  Time: 03/24/18 10:28 AM  Result Value Ref Range Status   Adenovirus NOT DETECTED NOT DETECTED Final   Coronavirus 229E NOT DETECTED NOT DETECTED Final   Coronavirus HKU1 NOT DETECTED NOT DETECTED Final   Coronavirus NL63 NOT DETECTED NOT DETECTED Final   Coronavirus OC43 NOT DETECTED NOT DETECTED Final   Metapneumovirus NOT DETECTED NOT DETECTED Final   Rhinovirus / Enterovirus NOT DETECTED NOT DETECTED Final   Influenza A NOT DETECTED NOT DETECTED Final   Influenza B NOT DETECTED NOT DETECTED Final   Parainfluenza Virus 1 NOT DETECTED NOT DETECTED Final   Parainfluenza Virus 2 NOT DETECTED NOT DETECTED Final   Parainfluenza Virus 3 NOT DETECTED NOT DETECTED Final   Parainfluenza Virus 4 NOT DETECTED NOT DETECTED Final   Respiratory Syncytial Virus DETECTED (A) NOT DETECTED Final    Comment: CRITICAL RESULT CALLED TO, READ BACK BY AND VERIFIED WITH: K HARTGROVE RN 03/24/18 2111 JDW    Bordetella pertussis NOT DETECTED NOT DETECTED Final   Chlamydophila pneumoniae NOT DETECTED NOT DETECTED Final   Mycoplasma pneumoniae NOT DETECTED NOT DETECTED Final    Comment: Performed at [facility], [address], [place], [place] [address]  Culture, sputum-assessment     Status: None   Collection Time: 03/24/18  9:47 PM  Result Value Ref Range Status   Specimen Description EXPECTORATED SPUTUM  Final   Special Requests NONE  Final   Sputum evaluation   Final    THIS SPECIMEN IS ACCEPTABLE FOR SPUTUM CULTURE Performed at [facility], [address] [place], [place] [address]    Report Status 03/25/2018 FINAL  Final  Culture, respiratory     Status: None   Collection Time: 03/24/18  9:47 PM  Result Value Ref Range Status   Specimen Description EXPECTORATED SPUTUM  Final   Special Requests NONE Reflexed from [id number]  Final   Gram  Stain   Final    ABUNDANT WBC PRESENT,BOTH PMN AND MONONUCLEAR MODERATE GRAM POSITIVE COCCI MODERATE GRAM VARIABLE ROD    Culture   Final    FEW  Consistent with normal respiratory flora. Performed at [facility], [address] [place], [place] [address]    Report Status 03/27/2018 FINAL  Final  Culture, Urine     Status: None   Collection Time: 03/25/18  6:04 PM  Result Value Ref Range Status   Specimen Description URINE, RANDOM  Final   Special Requests NONE  Final   Culture   Final    NO GROWTH Performed at [facility], [address] [place], [place] [address]    Report Status 03/26/2018 FINAL  Final    Radiology Studies: Dg Chest Port 1 View  Result Date: 03/27/2018 CLINICAL DATA:  Fever, chills, productive cough, shortness of breath EXAM: PORTABLE CHEST 1 VIEW COMPARISON:  03/26/2018 FINDINGS: Bilateral upper lobe opacities, right greater than left, suspicious for pneumonia. Possible left basilar opacity. No definite pleural effusions. No pneumothorax. The heart is top-normal in size.  Thoracic aortic atherosclerosis. IMPRESSION: Bilateral upper lobe opacities, right greater than left, suspicious for pneumonia. Electronically Signed   By: Charline Bills M.D.   On: 03/27/2018 08:17   Dg Chest Port 1 View  Result Date: 03/26/2018 CLINICAL DATA:  Shortness of breath. EXAM: PORTABLE CHEST 1 VIEW COMPARISON:  Radiograph September 23, 2018. FINDINGS: Stable cardiomegaly. Atherosclerosis of thoracic aorta is noted. No pneumothorax is noted. Stable faint opacity seen in right upper lobe which may represent developing pneumonia. Minimal left basilar subsegmental atelectasis may be present. Bony thorax is unremarkable. IMPRESSION: Stable faint right upper lobe opacity as described above. Aortic Atherosclerosis (ICD10-I70.0). Electronically Signed   By: Lupita Raider, M.D.   On: 03/26/2018 08:04   US Abdomen Limited Ruq  Result Date: 03/25/2018 CLINICAL DATA:  85 year old with elevated liver function tests. Surgical history includes cholecystectomy. EXAM: ULTRASOUND ABDOMEN LIMITED RIGHT UPPER QUADRANT COMPARISON:  No prior  ultrasound. CT abdomen and pelvis 03/23/2018, 07/01/2017. FINDINGS: Gallbladder: Surgically absent. Common bile duct: Diameter: Approximately 3 mm. Liver: Normal size and echotexture without focal parenchymal abnormality. Portal vein is patent on color Doppler imaging with normal direction of blood flow towards the liver. IMPRESSION: Normal examination post cholecystectomy. Electronically Signed   By: Hulan Saas M.D.   On: 03/25/2018 20:22   Scheduled Meds: . budesonide (PULMICORT) nebulizer solution  0.25 mg Nebulization BID  . guaiFENesin  1,200 mg Oral BID  . heparin injection (subcutaneous)  5,000 Units Subcutaneous Q8H  . ipratropium-albuterol  3 mL Nebulization TID  . latanoprost  1 drop Left Eye QHS  . levothyroxine  88 mcg Oral Q0600  . methylPREDNISolone (SOLU-MEDROL) injection  60 mg Intravenous Q8H  . pantoprazole  40 mg Oral Daily   Continuous Infusions: . azithromycin 500 mg (03/27/18 1153)  . cefTRIAXone (ROCEPHIN)  IV 1 g (03/27/18 0836)  . potassium PHOSPHATE IVPB (in mmol)      LOS: 3 days   Merlene Laughter, DO [facility] PAGER is on AMION  If 7PM-7AM, please contact night-coverage [email or web address] Password [email or web address] 03/27/2018, 2:55 PM

## 2018-03-27 NOTE — [place] FL2 (Signed)
[place] MEDICAID FL2 LEVEL OF CARE SCREENING TOOL     IDENTIFICATION  Patient Name: Jordan Horne Birthdate: 27-May-1932 Sex: female Admission Date (Current Location): 03/23/2018  [facility] [place]  [place]   Facility and Address:  [facility], [address], [place], [place] [address]      Provider Number: [id number]  Attending Physician Name and Address:  Merlene LaughterSheikh, Omair Latif, DO  Relative Name and Phone Number:  Linden DolinJackie Gulley; husband; [phone]    Current Level of Care: Hospital Recommended Level of Care: Skilled Nursing Facility Prior Approval Number:    Date Approved/Denied:   PASRR Number: [id number]  Discharge Plan: SNF    Current Diagnoses: Patient Active Problem List   Diagnosis Date Noted  . SBO (small bowel obstruction) (HCC) 03/24/2018  . CAP (community acquired pneumonia) 03/24/2018  . Chest pain of uncertain etiology 09/29/2013  . Right bundle branch block 09/29/2013  . Hypothyroidism 09/29/2013  . Hypercholesterolemia 09/29/2013    Orientation RESPIRATION BLADDER Height & Weight     Self, Place  Normal, O2(intermittant o2 nasal canula) Continent Weight: 156 lb 8.4 oz (71 kg) Height:  5\' 3"  (160 cm)  BEHAVIORAL SYMPTOMS/MOOD NEUROLOGICAL BOWEL NUTRITION STATUS      Continent Diet(see discharge summary)  AMBULATORY STATUS COMMUNICATION OF NEEDS Skin   Extensive Assist Verbally Normal                       Personal Care Assistance Level of Assistance  Bathing, Feeding, Dressing Bathing Assistance: Maximum assistance Feeding assistance: Limited assistance Dressing Assistance: Maximum assistance     Functional Limitations Info  Sight, Hearing, Speech Sight Info: Adequate Hearing Info: Adequate Speech Info: Adequate    SPECIAL CARE FACTORS FREQUENCY  OT (By licensed OT), PT (By licensed PT)     PT Frequency: 5x week OT Frequency: 5x week            Contractures Contractures  Info: Not present    Additional Factors Info  Code Status, Allergies, Isolation Precautions Code Status Info: Full Code Allergies Info: BETADINE POVIDONE IODINE      Isolation Precautions Info: Droplet precaution     Current Medications (03/27/2018):  This is the current hospital active medication list Current Facility-Administered Medications  Medication Dose Route Frequency Provider Last Rate Last Dose  . acetaminophen (TYLENOL) tablet 650 mg  650 mg Oral Q6H PRN Eduard ClosKakrakandy, Arshad N, MD   650 mg at 03/26/18 0239   Or  . acetaminophen (TYLENOL) suppository 650 mg  650 mg Rectal Q6H PRN Eduard ClosKakrakandy, Arshad N, MD   650 mg at 03/24/18 1115  . azithromycin (ZITHROMAX) 500 mg in sodium chloride 0.9 % 250 mL IVPB  500 mg Intravenous Q24H Eduard ClosKakrakandy, Arshad N, MD 250 mL/hr at 03/26/18 1105 500 mg at 03/26/18 1105  . budesonide (PULMICORT) nebulizer solution 0.25 mg  0.25 mg Nebulization BID Marguerita MerlesSheikh, Omair Latif, DO   0.25 mg at 03/27/18 0915  . cefTRIAXone (ROCEPHIN) 1 g in sodium chloride 0.9 % 100 mL IVPB  1 g Intravenous Q24H Eduard ClosKakrakandy, Arshad N, MD 200 mL/hr at 03/27/18 0836 1 g at 03/27/18 0836  . guaiFENesin (MUCINEX) 12 hr tablet 1,200 mg  1,200 mg Oral BID Marguerita MerlesSheikh, Omair [place], DO   1,200 mg at 03/26/18 2115  . heparin injection 5,000 Units  5,000 Units Subcutaneous [address] Sheikh, Omair [place]if, [place]   5,000 Units at 03/27/18 0518  . ipratropium-albuterol (DUONEB) 0.5-2.5 (3) MG/3ML nebulizer solution  3 mL  3 mL Nebulization TID Eduard ClosKakrakandy, Arshad N, MD   3 mL at 03/27/18 0915  . ipratropium-albuterol (DUONEB) 0.5-2.5 (3) MG/3ML nebulizer solution 3 mL  3 mL Nebulization Q6H PRN Eduard ClosKakrakandy, Arshad N, MD   3 mL at 03/27/18 0526  . latanoprost (XALATAN) 0.005 % ophthalmic solution 1 drop  1 drop Left Eye QHS Eduard ClosKakrakandy, Arshad N, MD   1 drop at 03/26/18 2116  . levothyroxine (SYNTHROID, LEVOTHROID) tablet 88 mcg  88 mcg Oral Q0600 Marguerita MerlesSheikh, Omair [place]if, [place]   88 mcg at 03/27/18 0518  .  methylPREDNISolone sodium succinate (SOLU-MEDROL) 125 mg/2 mL injection 60 mg  60 mg Intravenous [address] Sheikh, Omair [place], DO   60 mg at 03/27/18 0517  . ondansetron (ZOFRAN) tablet 4 mg  4 mg Oral Q6H PRN Eduard ClosKakrakandy, Arshad N, MD       Or  . ondansetron [facility](ZOFRAN) injection 4 mg  4 mg Intravenous Q6H PRN Eduard ClosKakrakandy, Arshad N, MD      . pantoprazole (PROTONIX) EC tablet 40 mg  40 mg Oral Daily Marguerita MerlesSheikh, Omair [place], DO   40 mg at 03/26/18 1105  . potassium PHOSPHATE 20 mmol in dextrose 5 % 500 mL infusion  20 mmol Intravenous Once Merlene LaughterSheikh, Omair Latif, DO         Discharge Medications: Please see discharge summary for a list of discharge medications.  Relevant Imaging Results:  Relevant Lab Results:   Additional Information SS[address][id number]  [name], [place]

## 2018-03-27 NOTE — Evaluation (Signed)
Occupational Therapy Evaluation Patient Details Name: Jordan SizerBobbie R Horne MRN: [id number] DOB: 1932/05/22 Today's Date: 03/27/2018    History of Present Illness The patient is in 85 year old Caucasian female with a past medical history hypothyroidism,  hyperlipidemia, glaucoma, hypertension, who presented to the ED 03/24/18 with a chief complaint of abdominal pain. Pt had SBO now respolved.  Pt has clinical presentation c/w pneumonia.  Popsitive for respiratoy syncytial virus   Clinical Impression   Pt is an 85 yo female s/p above complications. Pt performing ADL functional transfers and ADL functional mobility with Rw and minA for stability. Pt performing grooming at sink in standing with RW and fair balance at sink. Pt requires minA to modA for lower body ADL. Pt impulsive and requires maximal verbal cues to attend to task. Sitter in room. PTAL: unknown as pt is an unreliable source. Pt would benefit from continued OT skilled services for ADL, mobility, and safety in SNF setting.    Follow Up Recommendations  SNF;Supervision/Assistance - 24 hour    Equipment Recommendations       Recommendations for Other Services       Precautions / Restrictions Precautions Precautions: Fall Restrictions Weight Bearing Restrictions: No      Mobility Bed Mobility Overal bed mobility: Needs Assistance Bed Mobility: Supine to Sit           General bed mobility comments: Pt in recliner upon arrival  Transfers Overall transfer level: Needs assistance Equipment used: Rolling walker (2 wheeled) Transfers: Sit to/from Stand Sit to Stand: Min assist         General transfer comment: Pt requiring verbal cues to increase safety.    Balance Overall balance assessment: Needs assistance Sitting-balance support: Feet supported;No upper extremity supported Sitting balance-Leahy Scale: Good     Standing balance support: During functional activity;Single extremity supported Standing  balance-Leahy Scale: Fair                             ADL either performed or assessed with clinical judgement   ADL Overall ADL's : Needs assistance/impaired Eating/Feeding: Set up;Sitting;Bed level   Grooming: Wash/dry hands;Wash/dry face;Oral care;Min guard;Sitting;Standing;Cueing for safety   Upper Body Bathing: Minimal assistance;Sitting;Standing;Cueing for safety;Cueing for sequencing   Lower Body Bathing: Moderate assistance;+2 for safety/equipment;Sit to/from stand;Sitting/lateral leans   Upper Body Dressing : Minimal assistance;Cueing for safety;Sitting   Lower Body Dressing: Moderate assistance;+2 for safety/equipment;Sit to/from stand;Sitting/lateral leans   Toilet Transfer: Minimal assistance;Regular Toilet;Grab bars   Toileting- Clothing Manipulation and Hygiene: Moderate assistance;Cueing for safety;Cueing for sequencing;Sitting/lateral lean;Sit to/from stand       Functional mobility during ADLs: Minimal assistance;Cueing for safety;Rolling walker(impulsive requires verbal cues to slow down) General ADL Comments: Min to ModA due to safety and decreased ability to follow all commands     Vision Baseline Vision/History: No visual deficits Vision Assessment?: No apparent visual deficits     Perception     Praxis      Pertinent Vitals/Pain Pain Assessment: 0-10 Faces Pain Scale: No hurt     Hand Dominance Right   Extremity/Trunk Assessment Upper Extremity Assessment Upper Extremity Assessment: Overall WFL for tasks assessed   Lower Extremity Assessment Lower Extremity Assessment: Generalized weakness   Cervical / Trunk Assessment Cervical / Trunk Assessment: Normal   Communication Communication Communication: No difficulties   Cognition Arousal/Alertness: Awake/alert Behavior During Therapy: WFL for tasks assessed/performed Overall Cognitive Status: No family/caregiver present to determine baseline cognitive functioning Area of  Impairment: Orientation;Following commands;Safety/judgement;Awareness                 Orientation Level: Place;Time;Situation     Following Commands: Follows one step commands with increased time Safety/Judgement: Decreased awareness of safety     General Comments: patient unaware of needing BM until assisted to [facility]   General Comments       Exercises     Shoulder Instructions      Home Living Family/patient expects to be discharged to:: Private residence Living Arrangements: Spouse/significant other Available Help at Discharge: Family Type of Home: House                           Additional Comments: unable to gather prior function and caregivers due to confusion      Prior Functioning/Environment Level of Independence: Needs assistance        Comments: Unable to gather as pt unable to express information due to cognitive deficits        OT Problem List: Decreased strength;Decreased activity tolerance;Impaired balance (sitting and/or standing);Decreased safety awareness;Pain      OT Treatment/Interventions:      OT Goals(Current goals can be found in the care plan section) Acute Rehab OT Goals Patient Stated Goal: let me find my husband OT Goal Formulation: With patient Time For Goal Achievement: 04/10/18 Potential to Achieve Goals: Good ADL Goals Pt Will Perform Grooming: with supervision Pt Will Perform Lower Body Dressing: with supervision Pt Will Transfer to Toilet: with supervision Additional ADL Goal #1: Pt will perform ADL functional transfers with minguardA  OT Frequency: Min 2X/week   Barriers to D/C: Decreased caregiver support          Co-evaluation              AM-PAC OT "6 Clicks" Daily Activity     Outcome Measure Help from another person eating meals?: None Help from another person taking care of personal grooming?: A Little Help from another person toileting, which includes using toliet, bedpan, or urinal?: A  Little Help from another person bathing (including washing, rinsing, drying)?: A Lot Help from another person to put on and taking off regular upper body clothing?: A Little Help from another person to put on and taking off regular lower body clothing?: A Lot 6 Click Score: 17   End of Session Equipment Utilized During Treatment: Gait belt;Rolling walker Nurse Communication: Mobility status  Activity Tolerance: Patient tolerated treatment well Patient left: in chair;with call bell/phone within reach;with nursing/sitter in room  OT Visit Diagnosis: Unsteadiness on feet (R26.81);Muscle weakness (generalized) (M62.81)                Time: 8333-8329 OT Time Calculation (min): 26 min Charges:  OT General Charges $OT Visit: 1 Visit OT Evaluation $OT Eval Moderate Complexity: 1 Mod OT Treatments $Self Care/Home Management : 8-22 mins  Revonda Standard Cecil Cranker) Glendell Docker OTR/L Acute Rehabilitation Services Pager: [phone] Office: [phone]  Sandrea Hughs 03/27/2018, 1:13 PM

## 2018-03-28 ENCOUNTER — Inpatient Hospital Stay (HOSPITAL_COMMUNITY): Payer: Medicare Other

## 2018-03-28 LAB — COMPREHENSIVE METABOLIC PANEL
ALT: 28 U/L (ref 0–44)
AST: 23 U/L (ref 15–41)
Albumin: 2.6 g/dL — ABNORMAL LOW (ref 3.5–5.0)
Alkaline Phosphatase: 48 U/L (ref 38–126)
Anion gap: 7 (ref 5–15)
BUN: 11 mg/dL (ref 8–23)
CALCIUM: 8.2 mg/dL — AB (ref 8.9–10.3)
CO2: 29 mmol/L (ref 22–32)
CREATININE: 0.76 mg/dL (ref 0.44–1.00)
Chloride: 106 mmol/L (ref 98–111)
GFR calc Af Amer: >60 mL/min (ref >60)
Glucose, Bld: 153 mg/dL — ABNORMAL HIGH (ref 70–99)
Potassium: 3.9 mmol/L (ref 3.5–5.1)
Sodium: 142 mmol/L (ref 135–145)
Total Bilirubin: 0.7 mg/dL (ref 0.3–1.2)
Total Protein: 5.9 g/dL — ABNORMAL LOW (ref 6.5–8.1)

## 2018-03-28 LAB — CBC WITH DIFFERENTIAL/PLATELET
ABS IMMATURE GRANULOCYTES: 0.61 10*3/uL — AB (ref 0.00–0.07)
Basophils Absolute: 0 10*3/uL (ref 0.0–0.1)
Basophils Relative: 0 %
Eosinophils Absolute: 0 10*3/uL (ref 0.0–0.5)
Eosinophils Relative: 0 %
HCT: 36.5 % (ref 36.0–46.0)
HEMOGLOBIN: 11.8 g/dL — AB (ref 12.0–15.0)
Immature Granulocytes: 5 %
Lymphocytes Relative: 7 %
Lymphs Abs: 0.9 10*3/uL (ref 0.7–4.0)
MCH: 30.5 pg (ref 26.0–34.0)
MCHC: 32.3 g/dL (ref 30.0–36.0)
MCV: 94.3 fL (ref 80.0–100.0)
Monocytes Absolute: 0.7 10*3/uL (ref 0.1–1.0)
Monocytes Relative: 5 %
Neutro Abs: 10.9 10*3/uL — ABNORMAL HIGH (ref 1.7–7.7)
Neutrophils Relative %: 83 %
PLATELETS: 206 10*3/uL (ref 150–400)
RBC: 3.87 MIL/uL (ref 3.87–5.11)
RDW: 13.6 % (ref 11.5–15.5)
WBC: 13.1 10*3/uL — ABNORMAL HIGH (ref 4.0–10.5)
nRBC: 0 % (ref 0.0–0.2)

## 2018-03-28 LAB — MAGNESIUM: Magnesium: 2.1 mg/dL (ref 1.7–2.4)

## 2018-03-28 LAB — PHOSPHORUS: Phosphorus: 2.3 mg/dL — ABNORMAL LOW (ref 2.5–4.6)

## 2018-03-28 MED ORDER — FUROSEMIDE 10 MG/ML IJ SOLN
40.0000 mg | Freq: Once | INTRAMUSCULAR | Status: AC
  Administered 2018-03-28: 40 mg via INTRAVENOUS
  Filled 2018-03-28: qty 4

## 2018-03-28 MED ORDER — POTASSIUM PHOSPHATES 15 MMOLE/5ML IV SOLN
20.0000 mmol | Freq: Once | INTRAVENOUS | Status: AC
  Administered 2018-03-28: 20 mmol via INTRAVENOUS
  Filled 2018-03-28: qty 6.67

## 2018-03-28 MED ORDER — SIMVASTATIN 20 MG PO TABS
20.0000 mg | ORAL_TABLET | Freq: Every day | ORAL | Status: DC
  Administered 2018-03-29 – 2018-03-30 (×2): 20 mg via ORAL
  Filled 2018-03-28 (×2): qty 1

## 2018-03-28 MED ORDER — METHYLPREDNISOLONE SODIUM SUCC 125 MG IJ SOLR
60.0000 mg | Freq: Two times a day (BID) | INTRAMUSCULAR | Status: DC
  Administered 2018-03-28 – 2018-03-29 (×2): 60 mg via INTRAVENOUS
  Filled 2018-03-28 (×2): qty 2

## 2018-03-28 MED ORDER — SODIUM CHLORIDE 0.9 % IV SOLN
3.0000 g | Freq: Three times a day (TID) | INTRAVENOUS | Status: DC
  Administered 2018-03-28 – 2018-03-31 (×9): 3 g via INTRAVENOUS
  Filled 2018-03-28 (×12): qty 3

## 2018-03-28 NOTE — Plan of Care (Signed)

## 2018-03-28 NOTE — Progress Notes (Signed)
Pharmacy Antibiotic Note  Jordan Horne is a 85 y.o. female admitted on 03/23/2018. Now with possible aspiration PNA. Pharmacy has been consulted for Unasyn dosing.  Plan: Start Unasyn 3g IV Q8h Monitor clinical picture, renal function F/U C&S, abx deescalation / LOT  Height: 5\' 3"  (160 cm) Weight: 156 lb 8.4 oz (71 kg) IBW/kg (Calculated) : 52.4  Temp (24hrs), Avg:98.2 F (36.8 C), Min:98.1 F (36.7 C), Max:98.4 F (36.9 C)  Recent Labs  Lab 03/24/18 0040 03/24/18 0302 03/24/18 0450 03/25/18 0236 03/26/18 0149 03/27/18 0207 03/28/18 0246  WBC  --   --  9.3 14.6* 12.8* 18.9* 13.1*  CREATININE  --   --  0.81 0.75 0.69 0.72 0.76  LATICACIDVEN 1.99* 1.45  --   --   --   --   --     Estimated Creatinine Clearance: 50.3 mL/min (by C-G formula based on SCr of 0.76 mg/dL).    Allergies  Allergen Reactions  . Betadine [Povidone Iodine] Itching   Thank you for allowing pharmacy to be a part of this patient's care.  Armandina Stammer 03/28/2018 10:11 AM

## 2018-03-28 NOTE — Social Work (Signed)
Another call attempted to pt spouse Mayumi Riddles at [phone]. No response, HIPPA compliant message left again.  [name], MSW, [facility] Clinical Social Work [phone]

## 2018-03-28 NOTE — Progress Notes (Signed)
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  She states that she is been admitting abdominal pain for last few months which is been persistent and has had nausea and vomiting at times.  Last bowel movement was a few days ago.  She also states that she is having a productive sputum for the last few days and exhibiting fevers and chills.  CT of the abdomen was done which showed a possible volvulus at the ileum and general surgery was consulted.  She is started on empiric antibiotics for a Pneumonia and was found to be RSV +.  Assessment & Plan:   Principal Problem:   SBO (small bowel obstruction) (HCC) Active Problems:   Hypothyroidism   Hypercholesterolemia   CAP (community acquired pneumonia)  Abdominal Pain with possible Volvulus, improved -Appreciate general surgery consult.  -Patient was NPO and General Surgery was recommending continuing Bowel rest and observation but since Abdomen is soft and she had a BM the day before yesterday General Surgery advanced diet to a CLD yesterday and to a Soft Diet this AM -Per General Surgery does not appear to be a full 360 degree twist and does not be obstructing the bowel lumen or mesentery -Repeat KUB showed No evidence of bowel obstruction or ileus. -Further recommendations per general surgery.  Community-Acquired pneumonia with likely Concomitant COPD Exacerbation and Bronchitis with ? Aspiration Component  -Patient's symptoms are consistent with community-acquired pneumonia though chest x-ray and CAT scan does not show any definite infiltrates though does show some basilar atelectasis.  -CXR  today showed "Improvement in bilateral upper lobe infiltrates. Pattern suggest potential for aspiration pneumonia. Bibasilar atelectasis versus infiltrates are relatively stable with potential small bilateral pleural effusions." -Follow sputum cultures  -Influenza PCR Negative but Respiratory Virus Panel Positive for RSV -Urine for Legionella Ag pending and strep antigen Negative -Could also be from Aspiration. Will need to check SLP as diet has been advanced -Changed antibiotic coverage with IV Rocephin as well as IV azithromycin to IV Unasyn.  She was given IV cefepime in the ED and IV vancomycin as well as IV metronidazole and these have been stopped -C/w p.o. guaifenesin 1200 mg twice daily and discontinue guaifenesin-dextromethorphan 10 mL's every 8 PRN for cough -C/w flutter valve and incentive spirometer -IVF is now D/C'd -WBC went from 9.3 -> 14.6 -> 12.8 -> 13.1 -Scheduled DuoNeb 3 mL's every 6h -Added Budesonide 0.25 mg BID  -Increased Solumedrol to 60 mg q8h as she was still rhonchous and wheezing significantly yesterday but will cut down to IV 60 mg q12h as some improvement and continue for today -Will get PT/OT to evaluate and Treat; Recommending SNF -Will need to D/C Safety Sitter so she can go to SNF -Will Try some IV Lasix 40 mg x1 to see if that helps with Breathing as patient appeared mildly volume overloaded.  Acute Respiratory Failure with Hypoxia -In the setting as above -Continuous pulse oximetry and maintain O2 saturation greater than 90% -Continue with supplemental oxygen via nasal cannula and wean O2 as tolerated -Treatment as above and will need a home Ambulatory screen prior to discharge -Will need Home O2 Screen  Lactic Acidosis -Improved and lactate level went  from 1.9 and is now 1.45 -Continue IV fluid hydration as above  Abnormal LFT's, now improved -? Reactive -LFTs are normalized  -Checked RUQ U/S and Acute Hepatitis Panel -RUQ showed Normal  examination post cholecystectomy. -Acute Hepatitis Panel Negative -Resume Simvastatin  -Continue to Monitor and Trend CMP  Hypothyroidism  -Iatrogenic given postoperatively  -C/w po Levothyroxine 88 mcg Daily  Hyperlipidemia  -Continue home medications of Simvastatin when patient can take orally and LFTs are improved and stable; -Resume in AM and hold again today   Mild Hyperglycemia -Continue to Follow metabolic panel if still elevated may check hemoglobin A1c.  -Blood Sugar on CMP/BMP ranging from 103-163 -Continue to Monitor and place on Sensitive Novolog SSI if Blood Sugars are consistently elevated  Hypokalemia -Improved to 3.8 -Replete  IV K-Phos 20 mmol  -Continue to monitor and replete as necessary -Repeat CMP in a.m.  Hypophosphatemia -Phos this morning was 2.2 -Replete with IV KPhos 20 mmol again  -Continue to monitor and replete as necessary -Repeat Phosphorous level in the a.m.  History of Glaucoma -Continue Latanoprost 1 drop left eye nightly  GERD -Continue with po Pantoprazole 40 mg po Daily   Delirium/Sundowining/Mild Confusion -In the setting of Infection, Hypoxia and Steroid Use -Re-Orient Frequently  -Safety Sitter currently  -Delirium Precautions   Leukocytosis -Worsening in the setting of IV steroid demargination -Continue monitor for signs and symptoms infection -Continue monitor and treat infection as above -Repeat CBC in AM  DVT prophylaxis: Heparin 5,000 units sq q8h Code Status: FULL CODE Family Communication: No family present at bedside Disposition Plan: Remain Inpatient for continued treatment and D/C to SNF when respiratory status improves and able to fully take po; She still remains on O2 and will attempt to wean; Remains intermittently confused  Consultants:   General Surgery (signed off)  Procedures:   None   Antimicrobials:  Anti-infectives (From admission, onward)   Start     Dose/Rate Route Frequency Ordered  Stop   03/28/18 1100  Ampicillin-Sulbactam (UNASYN) 3 g in sodium chloride 0.9 % 100 mL IVPB     3 g 200 mL/hr over 30 Minutes Intravenous Every 8 hours 03/28/18 1009     03/24/18 1000  azithromycin (ZITHROMAX) 500 mg in sodium chloride 0.9 % 250 mL IVPB  Status:  Discontinued     500 mg 250 mL/hr over 60 Minutes Intravenous Every 24 hours 03/24/18 0439 03/28/18 1003   03/24/18 0800  cefTRIAXone (ROCEPHIN) 1 g in sodium chloride 0.9 % 100 mL IVPB  Status:  Discontinued     1 g 200 mL/hr over 30 Minutes Intravenous Every 24 hours 03/24/18 0439 03/28/18 1003   03/24/18 0145  vancomycin (VANCOCIN) 1,500 mg in sodium chloride 0.9 % 500 mL IVPB     1,500 mg 250 mL/hr over 120 Minutes Intravenous  Once 03/24/18 0131 03/24/18 0501   03/24/18 0130  ceFEPIme (MAXIPIME) 2 g in sodium chloride 0.9 % 100 mL IVPB     2 g 200 mL/hr over 30 Minutes Intravenous  Once 03/24/18 0127 03/24/18 0234   03/24/18 0130  metroNIDAZOLE (FLAGYL) IVPB 500 mg  Status:  Discontinued     500 mg 100 mL/hr over 60 Minutes Intravenous Every 8 hours 03/24/18 0127 03/26/18 1025   03/24/18 0130  vancomycin (VANCOCIN) IVPB 1000 mg/200 mL premix  Status:  Discontinued     1,000 mg 200 mL/hr over 60 Minutes Intravenous  Once 03/24/18 0127 03/24/18 0130     Subjective: Seen and examined at  bedside and was sitting in the chair bedside.  Felt okay but still required a sitter.  No chest pain, lightheadedness and thinks that her breathing is getting a little better.  Denies any other complaints or concerns at this time.  Objective: Vitals:   03/28/18 0625 03/28/18 0849 03/28/18 1406 03/28/18 1414  BP: (!) 148/100   (!) 148/83  Pulse: 90   86  Resp: 20   18  Temp: 98.2 F (36.8 C)   98.6 F (37 C)  TempSrc: Oral   Axillary  SpO2: 90% 94% 92% 93%  Weight:      Height:        Intake/Output Summary (Last 24 hours) at 03/28/2018 1915 Last data filed at 03/28/2018 1849 Gross per 24 hour  Intake 752 ml  Output 3425 ml  Net  -2673 ml   Filed Weights   03/25/18 0500 03/27/18 0500 03/28/18 0500  Weight: 70.1 kg 71 kg 71 kg   Examination: Physical Exam:  Constitutional: Well-nourished, well-developed Caucasian female is currently side confused in the chair bedside Eyes: Sclera anicteric.  Lids and conjunctive are normal ENMT: External ears and nose appear normal Neck: Appears supple no JVD Respiratory: Diminished to auscultation bilaterally with some wheezing but is improved from yesterday but does have some crackles and coarse breath sounds.  Rhonchi is improving and has basilar crackles.  Unlabored breathing but is wearing supplemental oxygen via nasal cannula Cardiovascular: Regular rate and rhythm.  No appreciable murmurs, rubs, gallops.  Has 1+ lower extremity edema Abdomen: Soft, nontender, slightly distended.  Bowel sounds present in 4 quadrants GU: Deferred Musculoskeletal: No contractures or cyanosis Skin: Skin is warm dry no appreciable rashes or lesions limited skin evaluation Neurologic: Cranial nerves II through XII grossly taken appreciable focal deficits Psychiatric: A little confused and has impaired judgment insight.  She is awake alert and oriented x2  Data Reviewed: I have personally reviewed following labs and imaging studies  CBC: Recent Labs  Lab 03/24/18 0450 03/25/18 0236 03/26/18 0149 03/27/18 0207 03/28/18 0246  WBC 9.3 14.6* 12.8* 18.9* 13.1*  NEUTROABS 8.7* 11.8* 11.6* 16.9* 10.9*  HGB 11.7* 11.8* 12.2 12.5 11.8*  HCT 36.2 35.2* 36.8 38.4 36.5  MCV 96.0 94.6 95.3 94.1 94.3  PLT 131* 130* 154 198 206   Basic Metabolic Panel: Recent Labs  Lab 03/24/18 0450 03/25/18 0236 03/26/18 0149 03/27/18 0207 03/28/18 0246  NA 139 141 141 143 142  K 3.4* 3.4* 3.5 3.8 3.9  CL 105 106 106 109 106  CO2 23 26 25 25 29   GLUCOSE 150* 103* 162* 163* 153*  BUN 6* 8 8 10 11   CREATININE 0.81 0.75 0.69 0.72 0.76  CALCIUM 7.6* 8.1* 8.1* 8.4* 8.2*  MG  --  1.9 2.0 2.0 2.1  PHOS  --   1.6* 1.4* 2.0* 2.3*   GFR: Estimated Creatinine Clearance: 50.3 mL/min (by C-G formula based on SCr of 0.76 mg/dL). Liver Function Tests: Recent Labs  Lab 03/24/18 0450 03/25/18 0236 03/26/18 0149 03/27/18 0207 03/28/18 0246  AST 50* 52* 33 24 23  ALT 27 46* 36 34 28  ALKPHOS 42 39 49 53 48  BILITOT 0.8 0.9 0.9 0.6 0.7  PROT 5.7* 5.6* 5.9* 6.5 5.9*  ALBUMIN 3.0* 2.7* 2.6* 2.8* 2.6*   Recent Labs  Lab 03/23/18 2220  LIPASE 19   No results for input(s): AMMONIA in the last 168 hours. Coagulation Profile: No results for input(s): INR, PROTIME in the last 168 hours. Cardiac Enzymes:  No results for input(s): CKTOTAL, CKMB, CKMBINDEX, TROPONINI in the last 168 hours. BNP (last 3 results) No results for input(s): PROBNP in the last 8760 hours. HbA1C: No results for input(s): HGBA1C in the last 72 hours. CBG: No results for input(s): GLUCAP in the last 168 hours. Lipid Profile: No results for input(s): CHOL, HDL, LDLCALC, TRIG, CHOLHDL, LDLDIRECT in the last 72 hours. Thyroid Function Tests: No results for input(s): TSH, T4TOTAL, FREET4, T3FREE, THYROIDAB in the last 72 hours. Anemia Panel: No results for input(s): VITAMINB12, FOLATE, FERRITIN, TIBC, IRON, RETICCTPCT in the last 72 hours. Sepsis Labs: Recent Labs  Lab 03/24/18 0040 03/24/18 0302  LATICACIDVEN 1.99* 1.45    Recent Results (from the past 240 hour(s))  Blood culture (routine x 2)     Status: None (Preliminary result)   Collection Time: 03/23/18 10:15 PM  Result Value Ref Range Status   Specimen Description BLOOD LEFT ARM  Final   Special Requests   Final    BOTTLES DRAWN AEROBIC AND ANAEROBIC Blood Culture adequate volume Performed at [facility], [address] [place], [place] [address]    Culture NO GROWTH 4 DAYS  Final   Report Status PENDING  Incomplete  Blood culture (routine x 2)     Status: None (Preliminary result)   Collection Time: 03/24/18 12:32 AM  Result Value Ref Range Status     Specimen Description BLOOD RIGHT HAND  Final   Special Requests   Final    BOTTLES DRAWN AEROBIC ONLY Blood Culture results may not be optimal due to an excessive volume of blood received in culture bottles Performed at [facility], [address] [place], [place] [address]    Culture NO GROWTH 4 DAYS  Final   Report Status PENDING  Incomplete  Respiratory Panel by PCR     Status: Abnormal   Collection Time: 03/24/18 10:28 AM  Result Value Ref Range Status   Adenovirus NOT DETECTED NOT DETECTED Final   Coronavirus 229E NOT DETECTED NOT DETECTED Final   Coronavirus HKU1 NOT DETECTED NOT DETECTED Final   Coronavirus NL63 NOT DETECTED NOT DETECTED Final   Coronavirus OC43 NOT DETECTED NOT DETECTED Final   Metapneumovirus NOT DETECTED NOT DETECTED Final   Rhinovirus / Enterovirus NOT DETECTED NOT DETECTED Final   Influenza A NOT DETECTED NOT DETECTED Final   Influenza B NOT DETECTED NOT DETECTED Final   Parainfluenza Virus 1 NOT DETECTED NOT DETECTED Final   Parainfluenza Virus 2 NOT DETECTED NOT DETECTED Final   Parainfluenza Virus 3 NOT DETECTED NOT DETECTED Final   Parainfluenza Virus 4 NOT DETECTED NOT DETECTED Final   Respiratory Syncytial Virus DETECTED (A) NOT DETECTED Final    Comment: CRITICAL RESULT CALLED TO, READ BACK BY AND VERIFIED WITH: K HARTGROVE RN 03/24/18 2111 JDW    Bordetella pertussis NOT DETECTED NOT DETECTED Final   Chlamydophila pneumoniae NOT DETECTED NOT DETECTED Final   Mycoplasma pneumoniae NOT DETECTED NOT DETECTED Final    Comment: Performed at [facility], [address] [place], [place] [address]  Culture, sputum-assessment     Status: None   Collection Time: 03/24/18  9:47 PM  Result Value Ref Range Status   Specimen Description EXPECTORATED SPUTUM  Final   Special Requests NONE  Final   Sputum evaluation   Final    THIS SPECIMEN IS ACCEPTABLE FOR SPUTUM CULTURE Performed at [facility], [address] [place],  [place] [address]  Report Status 03/25/2018 FINAL  Final  Culture, respiratory     Status: None   Collection Time: 03/24/18  9:47 PM  Result Value Ref Range Status   Specimen Description EXPECTORATED SPUTUM  Final   Special Requests NONE Reflexed from [id number]  Final   Gram Stain   Final    ABUNDANT WBC PRESENT,BOTH PMN AND MONONUCLEAR MODERATE GRAM POSITIVE COCCI MODERATE GRAM VARIABLE ROD    Culture   Final    FEW Consistent with normal respiratory flora. Performed at [facility], [address], [place], [place] [address]    Report Status 03/27/2018 FINAL  Final  Culture, Urine     Status: None   Collection Time: 03/25/18  6:04 PM  Result Value Ref Range Status   Specimen Description URINE, RANDOM  Final   Special Requests NONE  Final   Culture   Final    NO GROWTH Performed at [facility], [address] [place], [place] [address]    Report Status 03/26/2018 FINAL  Final    Radiology Studies: Dg Chest Port 1 View  Result Date: 03/28/2018 CLINICAL DATA:  Pneumonia. EXAM: PORTABLE CHEST 1 VIEW COMPARISON:  03/27/2018 FINDINGS: Stable cardiac enlargement. Partial clearing of bilateral upper lobe infiltrates with residual airspace disease remaining consistent with pneumonia. Pattern does suggest the potential for aspiration pneumonia. Bibasilar atelectasis versus infiltrates are relatively stable. There may be small bilateral pleural effusions. IMPRESSION: Improvement in bilateral upper lobe infiltrates. Pattern suggest potential for aspiration pneumonia. Bibasilar atelectasis versus infiltrates are relatively stable with potential small bilateral pleural effusions. Electronically Signed   By: Irish Lack M.D.   On: 03/28/2018 08:25   Dg Chest Port 1 View  Result Date: 03/27/2018 CLINICAL DATA:  Fever, chills, productive cough, shortness of breath EXAM: PORTABLE CHEST 1 VIEW COMPARISON:  03/26/2018 FINDINGS: Bilateral upper lobe opacities, right greater than left,  suspicious for pneumonia. Possible left basilar opacity. No definite pleural effusions. No pneumothorax. The heart is top-normal in size.  Thoracic aortic atherosclerosis. IMPRESSION: Bilateral upper lobe opacities, right greater than left, suspicious for pneumonia. Electronically Signed   By: Charline Bills M.D.   On: 03/27/2018 08:17   Scheduled Meds: . budesonide (PULMICORT) nebulizer solution  0.25 mg Nebulization BID  . guaiFENesin  1,200 mg Oral BID  . heparin injection (subcutaneous)  5,000 Units Subcutaneous Q8H  . ipratropium-albuterol  3 mL Nebulization TID  . latanoprost  1 drop Left Eye QHS  . levothyroxine  88 mcg Oral Q0600  . methylPREDNISolone (SOLU-MEDROL) injection  60 mg Intravenous Q12H  . pantoprazole  40 mg Oral Daily   Continuous Infusions: . ampicillin-sulbactam (UNASYN) IV 3 g (03/28/18 1224)    LOS: 4 days   Merlene Laughter, DO [facility] PAGER is on AMION  If 7PM-7AM, please contact night-coverage [email or web address] Password [email or web address] 03/28/2018, 7:15 PM

## 2018-03-29 ENCOUNTER — Inpatient Hospital Stay (HOSPITAL_COMMUNITY): Payer: Medicare Other

## 2018-03-29 ENCOUNTER — Other Ambulatory Visit: Payer: Self-pay

## 2018-03-29 DIAGNOSIS — D72829 Elevated white blood cell count, unspecified: Secondary | ICD-10-CM

## 2018-03-29 DIAGNOSIS — B974 Respiratory syncytial virus as the cause of diseases classified elsewhere: Secondary | ICD-10-CM

## 2018-03-29 LAB — CBC WITH DIFFERENTIAL/PLATELET
Abs Immature Granulocytes: 0.88 10*3/uL — ABNORMAL HIGH (ref 0.00–0.07)
Basophils Absolute: 0.1 10*3/uL (ref 0.0–0.1)
Basophils Relative: 1 %
Eosinophils Absolute: 0 10*3/uL (ref 0.0–0.5)
Eosinophils Relative: 0 %
HCT: 37.9 % (ref 36.0–46.0)
Hemoglobin: 12.3 g/dL (ref 12.0–15.0)
Immature Granulocytes: 8 %
LYMPHS PCT: 7 %
Lymphs Abs: 0.8 10*3/uL (ref 0.7–4.0)
MCH: 30.4 pg (ref 26.0–34.0)
MCHC: 32.5 g/dL (ref 30.0–36.0)
MCV: 93.8 fL (ref 80.0–100.0)
Monocytes Absolute: 0.5 10*3/uL (ref 0.1–1.0)
Monocytes Relative: 5 %
NEUTROS PCT: 79 %
Neutro Abs: 9.1 10*3/uL — ABNORMAL HIGH (ref 1.7–7.7)
Platelets: 221 10*3/uL (ref 150–400)
RBC: 4.04 MIL/uL (ref 3.87–5.11)
RDW: 13.3 % (ref 11.5–15.5)
WBC: 11.3 10*3/uL — ABNORMAL HIGH (ref 4.0–10.5)
nRBC: 0.4 % — ABNORMAL HIGH (ref 0.0–0.2)

## 2018-03-29 LAB — PHOSPHORUS: Phosphorus: 2.5 mg/dL (ref 2.5–4.6)

## 2018-03-29 LAB — COMPREHENSIVE METABOLIC PANEL
ALT: 30 U/L (ref 0–44)
ANION GAP: 9 (ref 5–15)
AST: 25 U/L (ref 15–41)
Albumin: 2.5 g/dL — ABNORMAL LOW (ref 3.5–5.0)
Alkaline Phosphatase: 40 U/L (ref 38–126)
BUN: 14 mg/dL (ref 8–23)
CO2: 27 mmol/L (ref 22–32)
Calcium: 8 mg/dL — ABNORMAL LOW (ref 8.9–10.3)
Chloride: 105 mmol/L (ref 98–111)
Creatinine, Ser: 0.71 mg/dL (ref 0.44–1.00)
GFR calc Af Amer: >60 mL/min (ref >60)
GFR calc non Af Amer: >60 mL/min (ref >60)
Glucose, Bld: 148 mg/dL — ABNORMAL HIGH (ref 70–99)
POTASSIUM: 3.3 mmol/L — AB (ref 3.5–5.1)
Sodium: 141 mmol/L (ref 135–145)
TOTAL PROTEIN: 5.6 g/dL — AB (ref 6.5–8.1)
Total Bilirubin: 0.7 mg/dL (ref 0.3–1.2)

## 2018-03-29 LAB — CULTURE, BLOOD (ROUTINE X 2)
Culture: NO GROWTH
Culture: NO GROWTH
Special Requests: ADEQUATE

## 2018-03-29 LAB — MAGNESIUM: Magnesium: 2 mg/dL (ref 1.7–2.4)

## 2018-03-29 MED ORDER — DIAZEPAM 5 MG PO TABS
5.0000 mg | ORAL_TABLET | Freq: Four times a day (QID) | ORAL | Status: DC | PRN
  Administered 2018-03-29 – 2018-03-30 (×2): 5 mg via ORAL
  Filled 2018-03-29 (×2): qty 1

## 2018-03-29 MED ORDER — METHYLPREDNISOLONE SODIUM SUCC 125 MG IJ SOLR
60.0000 mg | Freq: Every day | INTRAMUSCULAR | Status: DC
  Administered 2018-03-30 – 2018-03-31 (×2): 60 mg via INTRAVENOUS
  Filled 2018-03-29 (×2): qty 2

## 2018-03-29 MED ORDER — POTASSIUM CHLORIDE CRYS ER 20 MEQ PO TBCR
40.0000 meq | EXTENDED_RELEASE_TABLET | Freq: Two times a day (BID) | ORAL | Status: AC
  Administered 2018-03-29 (×2): 40 meq via ORAL
  Filled 2018-03-29 (×2): qty 2

## 2018-03-29 MED ORDER — FUROSEMIDE 10 MG/ML IJ SOLN
40.0000 mg | Freq: Once | INTRAMUSCULAR | Status: AC
  Administered 2018-03-29: 40 mg via INTRAVENOUS
  Filled 2018-03-29: qty 4

## 2018-03-29 MED ORDER — IPRATROPIUM-ALBUTEROL 0.5-2.5 (3) MG/3ML IN SOLN
3.0000 mL | Freq: Two times a day (BID) | RESPIRATORY_TRACT | Status: DC
  Administered 2018-03-29 – 2018-03-31 (×4): 3 mL via RESPIRATORY_TRACT
  Filled 2018-03-29 (×4): qty 3

## 2018-03-29 NOTE — Progress Notes (Signed)
Patient has periods of confusions at times. Makes attempts to get of her bed but not sure where she is going when asked. confusion waxes and wanes. Per family(sons and wives) this confusion has been going on for sometime.

## 2018-03-29 NOTE — Progress Notes (Signed)
SLP Cancellation Note  Patient Details Name: Jordan Horne MRN: [id number] DOB: Aug 22, 1932   Cancelled treatment:       Reason Eval/Treat Not Completed: Other (comment). Swallow orders received; SLP evaluated on 03/26/18 with findings of no clinical s/s of aspiration. Per MD concerns for aspiration and requesting reassessment, will proceed with MBS. Will attempt next date. Pt may continue current diet pending MBS.  Rondel Baton, [place], CCC-SLP Speech-Language Pathologist [facility] Services Pager: [phone] Office: [phone]    Arlana Lindau 03/29/2018, 11:02 AM

## 2018-03-29 NOTE — Progress Notes (Signed)
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  She states that she is been admitting abdominal pain for last few months which is been persistent and has had nausea and vomiting at times.  Last bowel movement was a few days ago.  She also states that she is having a productive sputum for the last few days and exhibiting fevers and chills.  CT of the abdomen was done which showed a possible volvulus at the ileum and general surgery was consulted.  She is started on empiric antibiotics for a Pneumonia and was found to be RSV +.  Her mentation is waxing and waning and she requires intermittent safety sitters especially at night because she continues to try to climb out of bed.  Assessment & Plan:   Principal Problem:   SBO (small bowel obstruction) (HCC) Active Problems:   Hypothyroidism   Hypercholesterolemia   CAP (community acquired pneumonia)  Abdominal Pain with possible Volvulus, improved -Appreciate general surgery consult.  -Patient was NPO and General Surgery was recommending continuing Bowel rest and observation but since Abdomen is soft and she had a BM the day before yesterday General Surgery advanced diet to a CLD yesterday and to a Soft Diet this AM -Per General Surgery does not appear to be a full 360 degree twist and does not be obstructing the bowel lumen or mesentery -Repeat KUB showed No evidence of bowel obstruction or ileus. -Further recommendations per general surgery they have signed off and patient is on a soft diet -We will obtain a speech evaluation for suspected aspiration  Community-Acquired pneumonia with likely  Concomitant COPD Exacerbation and Bronchitis with ? Aspiration Component  -Patient's symptoms are consistent with community-acquired pneumonia though chest x-ray and CAT scan does not show any definite infiltrates though does show some basilar atelectasis.  -CXR today showed "Improvement in bilateral upper lobe infiltrates. Pattern suggest potential for aspiration pneumonia. Bibasilar atelectasis versus infiltrates are relatively stable with potential small bilateral pleural effusions." -Follow sputum cultures  -Influenza PCR Negative but Respiratory Virus Panel Positive for RSV -Urine for Legionella Ag pending and strep antigen Negative -Could also be from Aspiration. Will need to check SLP as diet has been advanced -Changed antibiotic coverage with IV Rocephin as well as IV azithromycin to IV Unasyn.  She was given IV cefepime in the ED and IV vancomycin as well as IV metronidazole and these have been stopped; today is day 7 of 7 for antibiotic treatment -C/w p.o. guaifenesin 1200 mg twice daily and discontinue guaifenesin-dextromethorphan 10 mL's every 8 PRN for cough -C/w flutter valve and incentive spirometer -IVF is now D/C'd -WBC is now 11.3 -Scheduled DuoNeb 3 mL's every 6h -Added Budesonide 0.25 mg BID  -Medrol is now being weaned and we went to IV 60 daily -Will get PT/OT to evaluate and Treat; Recommending SNF patient is refusing SNF and wanted to go home and states that she has a hospital bed but unclear because her mental status is waxing and waning so we will need to discuss with family -Will need to D/C Safety Sitter so she can go to SNF and have asked nurse to remove with -Will Try IV Lasix 40 mg x1 again  to see if that helps with Breathing as patient appeared mildly volume overloaded with trace lower extremity edema.  Acute Respiratory Failure with Hypoxia, stable -In the setting as above -Continuous pulse oximetry and maintain O2 saturation greater than 90% -Continue with  supplemental oxygen via nasal cannula and wean O2 as tolerated -Tinea treatment as above and will need a home Ambulatory screen prior to discharge -Will need Home O2 Screen and have requested it be done today as it was not done yesterday  Lactic Acidosis -Improved and lactate level went from 1.9 and is now 1.45 -Continue IV fluid hydration as above  Abnormal LFT's, now improved -? Reactive -LFTs are normalized  -Checked RUQ U/S and Acute Hepatitis Panel -RUQ showed Normal examination post cholecystectomy. -Acute Hepatitis Panel Negative -Resume Simvastatin  -Continue to Monitor and Trend CMP  Hypothyroidism  -Iatrogenic given postoperatively  -C/w po Levothyroxine 88 mcg Daily  Hyperlipidemia  -Continue home medications of Simvastatin when patient can take orally and LFTs are improved and stable; -Resume in AM and hold again today   Mild Hyperglycemia -Continue to Follow metabolic panel if still elevated may check hemoglobin A1c.  -Blood Sugar on CMP/BMP ranging from 103-163 -Continue to Monitor and place on Sensitive Novolog SSI if Blood Sugars are consistently elevated  Hypokalemia -Was 3.3 this AM -Replete with po Potassium Chloride 40 mEQ BID x2 doses -Continue to monitor and replete as necessary -Repeat CMP in a.m.  Hypophosphatemia -Phos this morning was 2.5 -Replete with IV KPhos 20 mmol yesterday -Continue to monitor and replete as necessary -Repeat Phosphorous level in the a.m.  History of Glaucoma -Continue Latanoprost 1 drop left eye nightly  GERD -Continue with po Pantoprazole 40 mg po Daily   Delirium/Sundowining/Mild Confusion -In the setting of Infection, Hypoxia and Steroid Use; Wean Steroids -Re-Orient Frequently  -Safety Sitter currently now Discontined -Delirium Precautions  -Will Reach out to family to find out what her baseline is; Was alert and Oriented x3 this AM when I went to see her but nursing reports intermittent confusion and  agitation   Leukocytosis -Worsenied in the setting of IV steroid demargination but now improving -Continue monitor for signs and symptoms infection -WBC is now 11.3 -Continue monitor and treat infection as above -Repeat CBC in AM  DVT prophylaxis: Heparin 5,000 units sq q8h Code Status: FULL CODE Family Communication: No family present at bedside Disposition Plan: Remain Inpatient for continued treatment and D/C to SNF when respiratory status improves and able to fully take po; She still remains on O2 and will attempt to wean; Remains intermittently confused still  Consultants:   General Surgery (signed off)  Procedures:   None   Antimicrobials:  Anti-infectives (From admission, onward)   Start     Dose/Rate Route Frequency Ordered Stop   03/28/18 1100  Ampicillin-Sulbactam (UNASYN) 3 g in sodium chloride 0.9 % 100 mL IVPB     3 g 200 mL/hr over 30 Minutes Intravenous Every 8 hours 03/28/18 1009     03/24/18 1000  azithromycin (ZITHROMAX) 500 mg in sodium chloride 0.9 % 250 mL IVPB  Status:  Discontinued     500 mg 250 mL/hr over 60 Minutes Intravenous Every 24 hours 03/24/18 0439 03/28/18 1003   03/24/18 0800  cefTRIAXone (ROCEPHIN) 1 g in sodium chloride 0.9 % 100 mL IVPB  Status:  Discontinued     1 g 200 mL/hr over 30 Minutes Intravenous Every 24 hours 03/24/18 0439 03/28/18 1003   03/24/18 0145  vancomycin (VANCOCIN)  1,500 mg in sodium chloride 0.9 % 500 mL IVPB     1,500 mg 250 mL/hr over 120 Minutes Intravenous  Once 03/24/18 0131 03/24/18 0501   03/24/18 0130  ceFEPIme (MAXIPIME) 2 g in sodium chloride 0.9 % 100 mL IVPB     2 g 200 mL/hr over 30 Minutes Intravenous  Once 03/24/18 0127 03/24/18 0234   03/24/18 0130  metroNIDAZOLE (FLAGYL) IVPB 500 mg  Status:  Discontinued     500 mg 100 mL/hr over 60 Minutes Intravenous Every 8 hours 03/24/18 0127 03/26/18 1025   03/24/18 0130  vancomycin (VANCOCIN) IVPB 1000 mg/200 mL premix  Status:  Discontinued     1,000  mg 200 mL/hr over 60 Minutes Intravenous  Once 03/24/18 0127 03/24/18 0130     Subjective: Seen and examined at bedside and was calm and states that her breathing was improving.  Denies any chest pain, lightheadedness or dizziness.  No nausea or vomiting.  States that she has a hospital bed at home and wants to go home however will need to find out from her family what her baseline is as she remains intermittently confused.  Nursing reported morning that she is confused but several hours later she was improved.  We will continue to monitor and treat pneumonia and today's last day of antibiotics.  Objective: Vitals:   03/28/18 2046 03/28/18 2136 03/29/18 0535 03/29/18 0830  BP:  (!) 166/92 (!) 160/89   Pulse:  76 79   Resp:  17 16   Temp:  98.4 F (36.9 C) 97.9 F (36.6 C)   TempSrc:  Oral Oral   SpO2: 95% 97% 95% (!) 89%  Weight:      Height:        Intake/Output Summary (Last 24 hours) at 03/29/2018 1028 Last data filed at 03/29/2018 0946 Gross per 24 hour  Intake 908 ml  Output 2425 ml  Net -1517 ml   Filed Weights   03/25/18 0500 03/27/18 0500 03/28/18 0500  Weight: 70.1 kg 71 kg 71 kg   Examination: Physical Exam:  Constitutional: Well-nourished, well-developed Caucasian female currently no acute distress sitting up in the bed and states she is feeling better Eyes: Sclera anicteric.  Lids and conjunctive are normal ENMT: External ears and nose appear normal. Neck: Appears supple no JVD Respiratory: Still has diminished breath sounds with mild expiratory wheezing and coarse sounding upper fields.  Crackles are improving.  Unlabored breathing but is still wearing supplemental oxygen via nasal cannula Cardiovascular: Rate and rhythm.  No appreciable murmurs, gallops, rubs.  Has trace lower extremity edema to Abdomen: Soft, nontender, distended slightly but bowel sounds are present all 4 quadrants GU: Deferred Musculoskeletal: No contractures or cyanosis Skin: Skin is warm  and dry no appreciable rashes or lesions limited skin evaluation Neurologic: Cranial II through XII grossly intact with no appreciable focal deficits Psychiatric: Intermittently confused but will allow her to go see her today she is awake alert and oriented x3  Data Reviewed: I have personally reviewed following labs and imaging studies  CBC: Recent Labs  Lab 03/25/18 0236 03/26/18 0149 03/27/18 0207 03/28/18 0246 03/29/18 0519  WBC 14.6* 12.8* 18.9* 13.1* 11.3*  NEUTROABS 11.8* 11.6* 16.9* 10.9* 9.1*  HGB 11.8* 12.2 12.5 11.8* 12.3  HCT 35.2* 36.8 38.4 36.5 37.9  MCV 94.6 95.3 94.1 94.3 93.8  PLT 130* 154 198 206 221   Basic Metabolic Panel: Recent Labs  Lab 03/25/18 0236 03/26/18 0149 03/27/18 0207 03/28/18 0246 03/29/18 1610  NA 141 141 143 142 141  K 3.4* 3.5 3.8 3.9 3.3*  CL 106 106 109 106 105  CO2 26 25 25 29 27   GLUCOSE 103* 162* 163* 153* 148*  BUN 8 8 10 11 14   CREATININE 0.75 0.69 0.72 0.76 0.71  CALCIUM 8.1* 8.1* 8.4* 8.2* 8.0*  MG 1.9 2.0 2.0 2.1 2.0  PHOS 1.6* 1.4* 2.0* 2.3* 2.5   GFR: Estimated Creatinine Clearance: 50.3 mL/min (by C-G formula based on SCr of 0.71 mg/dL). Liver Function Tests: Recent Labs  Lab 03/25/18 0236 03/26/18 0149 03/27/18 0207 03/28/18 0246 03/29/18 0519  AST 52* 33 24 23 25   ALT 46* 36 34 28 30  ALKPHOS 39 49 53 48 40  BILITOT 0.9 0.9 0.6 0.7 0.7  PROT 5.6* 5.9* 6.5 5.9* 5.6*  ALBUMIN 2.7* 2.6* 2.8* 2.6* 2.5*   Recent Labs  Lab 03/23/18 2220  LIPASE 19   No results for input(s): AMMONIA in the last 168 hours. Coagulation Profile: No results for input(s): INR, PROTIME in the last 168 hours. Cardiac Enzymes: No results for input(s): CKTOTAL, CKMB, CKMBINDEX, TROPONINI in the last 168 hours. BNP (last 3 results) No results for input(s): PROBNP in the last 8760 hours. HbA1C: No results for input(s): HGBA1C in the last 72 hours. CBG: No results for input(s): GLUCAP in the last 168 hours. Lipid Profile: No  results for input(s): CHOL, HDL, LDLCALC, TRIG, CHOLHDL, LDLDIRECT in the last 72 hours. Thyroid Function Tests: No results for input(s): TSH, T4TOTAL, FREET4, T3FREE, THYROIDAB in the last 72 hours. Anemia Panel: No results for input(s): VITAMINB12, FOLATE, FERRITIN, TIBC, IRON, RETICCTPCT in the last 72 hours. Sepsis Labs: Recent Labs  Lab 03/24/18 0040 03/24/18 0302  LATICACIDVEN 1.99* 1.45    Recent Results (from the past 240 hour(s))  Blood culture (routine x 2)     Status: None   Collection Time: 03/23/18 10:15 PM  Result Value Ref Range Status   Specimen Description BLOOD LEFT ARM  Final   Special Requests   Final    BOTTLES DRAWN AEROBIC AND ANAEROBIC Blood Culture adequate volume Performed at [facility], [address] [place], [place] [address]    Culture NO GROWTH 5 DAYS  Final   Report Status 03/29/2018 FINAL  Final  Blood culture (routine x 2)     Status: None   Collection Time: 03/24/18 12:32 AM  Result Value Ref Range Status   Specimen Description BLOOD RIGHT HAND  Final   Special Requests   Final    BOTTLES DRAWN AEROBIC ONLY Blood Culture results may not be optimal due to an excessive volume of blood received in culture bottles Performed at [facility], [address] [place], [place] [address]    Culture NO GROWTH 5 DAYS  Final   Report Status 03/29/2018 FINAL  Final  Respiratory Panel by PCR     Status: Abnormal   Collection Time: 03/24/18 10:28 AM  Result Value Ref Range Status   Adenovirus NOT DETECTED NOT DETECTED Final   Coronavirus 229E NOT DETECTED NOT DETECTED Final   Coronavirus HKU1 NOT DETECTED NOT DETECTED Final   Coronavirus NL63 NOT DETECTED NOT DETECTED Final   Coronavirus OC43 NOT DETECTED NOT DETECTED Final   Metapneumovirus NOT DETECTED NOT DETECTED Final   Rhinovirus / Enterovirus NOT DETECTED NOT DETECTED Final   Influenza A NOT DETECTED NOT DETECTED Final   Influenza B NOT DETECTED NOT DETECTED Final   Parainfluenza  Virus 1 NOT  DETECTED NOT DETECTED Final   Parainfluenza Virus 2 NOT DETECTED NOT DETECTED Final   Parainfluenza Virus 3 NOT DETECTED NOT DETECTED Final   Parainfluenza Virus 4 NOT DETECTED NOT DETECTED Final   Respiratory Syncytial Virus DETECTED (A) NOT DETECTED Final    Comment: CRITICAL RESULT CALLED TO, READ BACK BY AND VERIFIED WITH: K HARTGROVE RN 03/24/18 2111 JDW    Bordetella pertussis NOT DETECTED NOT DETECTED Final   Chlamydophila pneumoniae NOT DETECTED NOT DETECTED Final   Mycoplasma pneumoniae NOT DETECTED NOT DETECTED Final    Comment: Performed at [facility], [address] [place], [place] [address]  Culture, sputum-assessment     Status: None   Collection Time: 03/24/18  9:47 PM  Result Value Ref Range Status   Specimen Description EXPECTORATED SPUTUM  Final   Special Requests NONE  Final   Sputum evaluation   Final    THIS SPECIMEN IS ACCEPTABLE FOR SPUTUM CULTURE Performed at [facility], [address] [place], [place] [address]    Report Status 03/25/2018 FINAL  Final  Culture, respiratory     Status: None   Collection Time: 03/24/18  9:47 PM  Result Value Ref Range Status   Specimen Description EXPECTORATED SPUTUM  Final   Special Requests NONE Reflexed from [id number]  Final   Gram Stain   Final    ABUNDANT WBC PRESENT,BOTH PMN AND MONONUCLEAR MODERATE GRAM POSITIVE COCCI MODERATE GRAM VARIABLE ROD    Culture   Final    FEW Consistent with normal respiratory flora. Performed at [facility], [address] [place], [place] [address]    Report Status 03/27/2018 FINAL  Final  Culture, Urine     Status: None   Collection Time: 03/25/18  6:04 PM  Result Value Ref Range Status   Specimen Description URINE, RANDOM  Final   Special Requests NONE  Final   Culture   Final    NO GROWTH Performed at [facility], [address], [place], [place] [address]    Report Status 03/26/2018 FINAL  Final    Radiology Studies: Dg Chest Port  1 View  Result Date: 03/29/2018 CLINICAL DATA:  Shortness of breath. EXAM: PORTABLE CHEST 1 VIEW COMPARISON:  03/28/2018 FINDINGS: Normal heart size. Aortic atherosclerosis. Bilateral upper lobe interstitial opacities appears similar to previous exam. No new findings. IMPRESSION: 1. Persistent bilateral upper lobe interstitial opacities. Unchanged. Electronically Signed   By: Signa Kell M.D.   On: 03/29/2018 09:09   Dg Chest Port 1 View  Result Date: 03/28/2018 CLINICAL DATA:  Pneumonia. EXAM: PORTABLE CHEST 1 VIEW COMPARISON:  03/27/2018 FINDINGS: Stable cardiac enlargement. Partial clearing of bilateral upper lobe infiltrates with residual airspace disease remaining consistent with pneumonia. Pattern does suggest the potential for aspiration pneumonia. Bibasilar atelectasis versus infiltrates are relatively stable. There may be small bilateral pleural effusions. IMPRESSION: Improvement in bilateral upper lobe infiltrates. Pattern suggest potential for aspiration pneumonia. Bibasilar atelectasis versus infiltrates are relatively stable with potential small bilateral pleural effusions. Electronically Signed   By: Irish Lack M.D.   On: 03/28/2018 08:25   Scheduled Meds: . budesonide (PULMICORT) nebulizer solution  0.25 mg Nebulization BID  . furosemide  40 mg Intravenous Once  . guaiFENesin  1,200 mg Oral BID  . heparin injection (subcutaneous)  5,000 Units Subcutaneous Q8H  . ipratropium-albuterol  3 mL Nebulization TID  . latanoprost  1 drop Left Eye QHS  . levothyroxine  88 mcg Oral Q0600  . [  START ON 03/30/2018] methylPREDNISolone (SOLU-MEDROL) injection  60 mg Intravenous Daily  . pantoprazole  40 mg Oral Daily  . potassium chloride  40 mEq Oral BID  . simvastatin  20 mg Oral q1800   Continuous Infusions: . ampicillin-sulbactam (UNASYN) IV 3 g (03/29/18 0935)    LOS: 5 days   Merlene Laughter, DO [facility] PAGER is on AMION  If 7PM-7AM, please contact  night-coverage [email or web address] Password [facility] 03/29/2018, 10:28 AM

## 2018-03-29 NOTE — Progress Notes (Signed)
Patient had ambulation o2 study today. sats on 3l with ambulation 97% sats same distance without o2 ranging bet 82-88. Patient also had noted SOB without oxygen. Provider paged with above finding

## 2018-03-30 ENCOUNTER — Inpatient Hospital Stay (HOSPITAL_COMMUNITY): Payer: Medicare Other

## 2018-03-30 LAB — CBC WITH DIFFERENTIAL/PLATELET
Abs Immature Granulocytes: 0.3 10*3/uL — ABNORMAL HIGH (ref 0.00–0.07)
Basophils Absolute: 0 10*3/uL (ref 0.0–0.1)
Basophils Relative: 0 %
EOS ABS: 0 10*3/uL (ref 0.0–0.5)
Eosinophils Relative: 0 %
HCT: 37.4 % (ref 36.0–46.0)
Hemoglobin: 12.3 g/dL (ref 12.0–15.0)
Lymphocytes Relative: 8 %
Lymphs Abs: 1.1 10*3/uL (ref 0.7–4.0)
MCH: 30.9 pg (ref 26.0–34.0)
MCHC: 32.9 g/dL (ref 30.0–36.0)
MCV: 94 fL (ref 80.0–100.0)
MONOS PCT: 2 %
Monocytes Absolute: 0.3 10*3/uL (ref 0.1–1.0)
Myelocytes: 2 %
Neutro Abs: 11.8 10*3/uL — ABNORMAL HIGH (ref 1.7–7.7)
Neutrophils Relative %: 88 %
Platelets: 242 10*3/uL (ref 150–400)
RBC: 3.98 MIL/uL (ref 3.87–5.11)
RDW: 13.4 % (ref 11.5–15.5)
WBC: 13.4 10*3/uL — ABNORMAL HIGH (ref 4.0–10.5)
nRBC: 0.3 % — ABNORMAL HIGH (ref 0.0–0.2)
nRBC: 1 /100 WBC — ABNORMAL HIGH

## 2018-03-30 LAB — COMPREHENSIVE METABOLIC PANEL
ALK PHOS: 40 U/L (ref 38–126)
ALT: 29 U/L (ref 0–44)
AST: 24 U/L (ref 15–41)
Albumin: 2.5 g/dL — ABNORMAL LOW (ref 3.5–5.0)
Anion gap: 8 (ref 5–15)
BILIRUBIN TOTAL: 1 mg/dL (ref 0.3–1.2)
BUN: 15 mg/dL (ref 8–23)
CO2: 24 mmol/L (ref 22–32)
Calcium: 8.4 mg/dL — ABNORMAL LOW (ref 8.9–10.3)
Chloride: 107 mmol/L (ref 98–111)
Creatinine, Ser: 0.75 mg/dL (ref 0.44–1.00)
GFR calc Af Amer: >60 mL/min (ref >60)
GFR calc non Af Amer: >60 mL/min (ref >60)
Glucose, Bld: 148 mg/dL — ABNORMAL HIGH (ref 70–99)
Potassium: 3.7 mmol/L (ref 3.5–5.1)
Sodium: 139 mmol/L (ref 135–145)
TOTAL PROTEIN: 5.6 g/dL — AB (ref 6.5–8.1)

## 2018-03-30 LAB — BRAIN NATRIURETIC PEPTIDE: B Natriuretic Peptide: 776.5 pg/mL — ABNORMAL HIGH (ref 0.0–100.0)

## 2018-03-30 LAB — PHOSPHORUS: Phosphorus: 2.7 mg/dL (ref 2.5–4.6)

## 2018-03-30 LAB — MAGNESIUM: MAGNESIUM: 2 mg/dL (ref 1.7–2.4)

## 2018-03-30 MED ORDER — FUROSEMIDE 10 MG/ML IJ SOLN
40.0000 mg | Freq: Once | INTRAMUSCULAR | Status: AC
  Administered 2018-03-30: 40 mg via INTRAVENOUS
  Filled 2018-03-30: qty 4

## 2018-03-30 NOTE — Progress Notes (Signed)
Physical Therapy Treatment Patient Details Name: Jordan Horne MRN: [id number] DOB: October 01, 1932 Today's Date: 03/30/2018    History of Present Illness The patient is in 85 year old Caucasian female with a past medical history hypothyroidism,  hyperlipidemia, glaucoma, hypertension, who presented to the ED 03/24/18 with a chief complaint of abdominal pain. Pt had SBO now respolved.  Pt has clinical presentation c/w pneumonia.  Popsitive for respiratoy syncytial virus    PT Comments    Pt doing better today from mobility and cognitive standpoint, following commands and appropriate with conversation. Ambulated 250' with RW and min-guard A. Became SOB when ambulating without O2, SpO2 94% when ambulating with 2L. Changed rec from SNF to HHPT since she is mobilizing better and will likely do better in her home environment from a cognitive standpoint. PT will continue to follow.    Follow Up Recommendations  Home health PT     Equipment Recommendations       Recommendations for Other Services       Precautions / Restrictions Precautions Precautions: Fall Precaution Comments: monitor SpO2 Restrictions Weight Bearing Restrictions: No    Mobility  Bed Mobility Overal bed mobility: Needs Assistance Bed Mobility: Supine to Sit     Supine to sit: Supervision     General bed mobility comments: pt able to come to EOB without physical assist  Transfers Overall transfer level: Needs assistance Equipment used: Rolling walker (2 wheeled) Transfers: Sit to/from Stand Sit to Stand: Min guard         General transfer comment: vc's for hand placement with each transfer  Ambulation/Gait Ambulation/Gait assistance: Min guard Gait Distance (Feet): 250 Feet Assistive device: Rolling walker (2 wheeled) Gait Pattern/deviations: Step-through pattern;Trunk flexed;Decreased stride length Gait velocity: decreased Gait velocity interpretation: <1.8 ft/sec, indicate of risk for recurrent  falls General Gait Details: pt with trunk flexed but improved with distance   Stairs             Wheelchair Mobility    Modified Rankin (Stroke Patients Only)       Balance Overall balance assessment: Needs assistance Sitting-balance support: Feet supported;No upper extremity supported Sitting balance-Leahy Scale: Good     Standing balance support: During functional activity;Single extremity supported Standing balance-Leahy Scale: Fair Standing balance comment: pt able to stand at sink and wash hands without UE support                            Cognition Arousal/Alertness: Awake/alert Behavior During Therapy: WFL for tasks assessed/performed Overall Cognitive Status: No family/caregiver present to determine baseline cognitive functioning Area of Impairment: Orientation;Following commands;Safety/judgement;Awareness                 Orientation Level: Place;Time;Situation     Following Commands: Follows one step commands consistently;Follows multi-step commands with increased time Safety/Judgement: Decreased awareness of safety     General Comments: pt appropriate throughout session      Exercises General Exercises - Lower Extremity Ankle Circles/Pumps: AROM;Both;10 reps;Seated    General Comments General comments (skin integrity, edema, etc.): pt went in and out of bathroom without O2 but was SOB and SpO2 91%. Used 2L O2 for ambulation and pt able to converse without SOB and SpO2 94% after walking      Pertinent Vitals/Pain Pain Assessment: No/denies pain Faces Pain Scale: No hurt    Home Living  Prior Function            PT Goals (current goals can now be found in the care plan section) Acute Rehab PT Goals Patient Stated Goal: return home PT Goal Formulation: Patient unable to participate in goal setting Time For Goal Achievement: 04/09/18 Potential to Achieve Goals: Good Progress towards PT goals:  Progressing toward goals    Frequency    Min 3X/week      PT Plan Discharge plan needs to be updated    Co-evaluation              AM-PAC PT "6 Clicks" Mobility   Outcome Measure  Help needed turning from your back to your side while in a flat bed without using bedrails?: None Help needed moving from lying on your back to sitting on the side of a flat bed without using bedrails?: None Help needed moving to and from a bed to a chair (including a wheelchair)?: A Little Help needed standing up from a chair using your arms (e.g., wheelchair or bedside chair)?: A Little Help needed to walk in hospital room?: A Little Help needed climbing 3-5 steps with a railing? : A Lot 6 Click Score: 19    End of Session Equipment Utilized During Treatment: Oxygen Activity Tolerance: Patient tolerated treatment well Patient left: in chair;with call bell/phone within reach;with nursing/sitter in room Nurse Communication: Mobility status PT Visit Diagnosis: Unsteadiness on feet (R26.81)     Time: [id number] PT Time Calculation (min) (ACUTE ONLY): 17 min  Charges:  $Gait Training: 8-22 mins                     Lyanne CoVictoria Jw Covin, PT  Acute Rehab Services  Pager [phone] Office [phone]    Lawana ChambersVictoria L Jaycie Kregel 03/30/2018, 2:13 PM

## 2018-03-30 NOTE — Social Work (Addendum)
4:40pm- Confirmed pt will have a bed at [facility] tomorrow, pt husband amenable to semi private room. Will page MD.  1:34pm- Received return call from pt husband, he spoke with family members and they are in support of SNF. Pt husband prefers [facility]. Referral sent.  12:30pm- Pt no longer requiring assistance of sitter and telesitter. CSW was able to reach pt husband Annice Pih at [phone]. He states that he and pt live at home alone. They have a son that lives within walking distance to their home and two other sons that are supportive with their care. The sons in birth order are Cletis Athens (wife is Cala Bradford), Mellody Dance, and Chrissie Noa. Pt husband unable to provide CSW with their phone numbers. Pt husband has been receiving home health services and states he would prefer that for his wife (the pt) as well. We discussed that pt has been recommended for SNF. Pt states that he feels like she would do better and be less confused if she came home. I did mention to husband that I had not seen pt family present during therapy sessions so I did want to ensure that they understand pt needs at this time. He states that he comes in the evening with a son, and again states that he would like to take her home   Pt husband has a hospital bed and home health that he will call CSW with the company that he is receiving services through. Pt husband also states he will call with his son's phone number. Will alert RNCM.  [name], MSW, [facility] Clinical Social Work [phone]

## 2018-03-30 NOTE — Progress Notes (Signed)
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  She states that she is been admitting abdominal pain for last few months which is been persistent and has had nausea and vomiting at times.  Last bowel movement was a few days ago.  She also states that she is having a productive sputum for the last few days and exhibiting fevers and chills.  CT of the abdomen was done which showed a possible volvulus at the ileum and general surgery was consulted.  She is started on empiric antibiotics for a Pneumonia and was found to be RSV +.  Her mentation is waxing and waning and she requires intermittent safety sitters especially at night because she continues to try to climb out of bed.  Appeared volume overloaded so we will be checking an echocardiogram and obtain a BMP.  Still has concerns for aspiration so will go undergo MBS today  Assessment & Plan:   Principal Problem:   SBO (small bowel obstruction) (HCC) Active Problems:   Hypothyroidism   Hypercholesterolemia   CAP (community acquired pneumonia)  Abdominal Pain with possible Volvulus, improved -Appreciate general surgery consult.  -Patient was NPO and General Surgery was recommending continuing Bowel rest and observation but since Abdomen is soft and she had a BM the day before yesterday General Surgery advanced diet to a CLD yesterday and to a Soft Diet this AM -Per General Surgery does not appear to be a full 360 degree twist and does not be obstructing the bowel lumen or mesentery -Repeat KUB showed No evidence of bowel obstruction or ileus. -Further recommendations per general surgery they have  signed off and patient is on a soft diet -We will obtain a speech evaluation for suspected aspiration and patient is to undergo an MBS today.  For now we will continue on soft diet per recommendations of SLP  Community-Acquired pneumonia with likely Concomitant COPD Exacerbation and Bronchitis with ? Aspiration Component  -Patient's symptoms are consistent with community-acquired pneumonia though chest x-ray and CAT scan does not show any definite infiltrates though does show some basilar atelectasis.  -CXR today showed cardiomegaly with pulmonary edema and cephalization of the vasculature suspected.  There is also new consolidation of the left base that may represent infiltrate, atelectasis and/or a left-sided pleural effusion." -Follow sputum cultures  -Influenza PCR Negative but Respiratory Virus Panel Positive for RSV -Urine for Legionella Ag pending and strep antigen Negative -Could also be from Aspiration. Will need to check SLP as diet has been advanced and they recommend continuing soft diet for now and recommending checking a formal MBS today given the concern for aspiration still -Changed antibiotic coverage with IV Rocephin as well as IV azithromycin to IV Unasyn.  She was given IV cefepime in the ED and IV vancomycin as well as IV metronidazole and these have been stopped; today is day 8  for antibiotic treatment but will continue for now -C/w p.o. guaifenesin 1200 mg twice daily and discontinue guaifenesin-dextromethorphan 10 mL's every 8 PRN for cough -C/w flutter valve and incentive spirometer -IVF is now D/C'd -WBC went from 11.3 to 13.4 -Scheduled DuoNeb 3 mL's every 6h -Added Budesonide 0.25 mg BID  -Solu-Medrol is now  being weaned and we went to IV 60 daily -Will get PT/OT to evaluate and Treat; Recommending SNF patient is refusing SNF and wanted to go home and states that she has a hospital bed but unclear because her mental status is waxing and waning so we will need to  discuss with family -Will need to D/C Safety Sitter so she can go to SNF and have asked nurse to remove with -Resumed IV Lasix 40 mg x1 again to see if that helps with Breathing as patient appeared mildly volume overloaded with trace lower extremity edema; will check echocardiogram and BNP to rule out heart failure component  Volume Overload/Suspected CHF exacerbation of unknown type -Patient remains volume overloaded and chest x-ray this morning showed cardiomegaly and pulmonary edema with cephalization of the vasculature suspected.  There is a new consolidation of the left base that may represent infiltrate, atelectasis and/or a left-sided pleural effusion. -Patient has 1+ lower extremity edema bilaterally slightly worse on the left than compared to the right -We will check BNP -Check echocardiogram -Resumed diuresis with IV Lasix 40 mg once this morning -Strict I's and O's and daily weights -Patient is 2.579 L positive since admission -Weight is up slightly and went from 152 and 156 -Continue to monitor volume status carefully  Acute Respiratory Failure with Hypoxia -In the setting as above from Pneumonia and ?CHF -Continuous pulse oximetry and maintain O2 saturation greater than 90% -Continue with supplemental oxygen via nasal cannula and wean O2 as tolerated -Continue treatment as above and will need a home Ambulatory screen prior to discharge -Patient had a ambulatory O2 study done yesterday and on room air she was ranging from 82 to 88% and was placed on 3 L and had saturations of 97%.  She had noted shortness of breath without using oxygen  Lactic Acidosis -Improved and lactate level went from 1.9 and is now 1.45 -Continue IV fluid hydration as above has now been discontinued  Abnormal LFT's, now improved -? Reactive -LFTs are normalized  -Checked RUQ U/S and Acute Hepatitis Panel -RUQ showed Normal examination post cholecystectomy. -Acute Hepatitis Panel Negative -Resumed  Simvastatin  -Continue to Monitor and Trend CMP  Hypothyroidism  -Iatrogenic given postoperatively  -C/w po Levothyroxine 88 mcg Daily  Hyperlipidemia  -Continue home medications of Simvastatin when patient can take orally and LFTs are improved and stable; -Resumed simvastatin as LFTs have improved  Mild Hyperglycemia -Continue to Follow metabolic panel if still elevated may check hemoglobin A1c.  -Blood Sugar on CMP/BMP ranging from 103-163 -Continue to Monitor and place on Sensitive Novolog SSI if Blood Sugars are consistently elevated  Hypokalemia improved -Was 3.3 yesterday AM and is now 3.7 -Replete with po Potassium Chloride 40 mEQ BID x2 doses yesterday -Continue to monitor and replete as necessary -Repeat CMP in a.m.  Hypophosphatemia -Phos this morning was 2.7 -Replete with IV KPhos 20 mmol yesterday -Continue to monitor and replete as necessary -Repeat Phosphorous level in the a.m.  History of Glaucoma -Continue Latanoprost 1 drop left eye nightly  GERD -Continue with po Pantoprazole 40 mg po Daily   Delirium/Sundowining/Mild Confusion -In the setting of Infection, Hypoxia and Steroid Use; Wean Steroids, patient's family states that she has always intermittently being confused -Re-Orient Frequently  Secondary school teacher currently now Discontined -Delirium Precautions  -Family states that her baseline is that she is always intermittently confused -Have removed safety sitter and will need to discuss with family about placement options  Leukocytosis -Worsenied in the setting of IV  steroid demargination but now improving -Continue monitor for signs and symptoms infection -WBC has now slightly worsened to 13.4 but in the setting of IV steroids -Continue monitor and treat infection as above -Repeat CBC in AM  DVT prophylaxis: Heparin 5,000 units sq q8h Code Status: FULL CODE Family Communication: No family present at bedside Disposition Plan: Remain Inpatient  for continued treatment and D/C to SNF or home with home health when respiratory status improves and able to fully take po; She still remains on O2 and will attempt to wean; Remains intermittently confused still  Consultants:   General Surgery (signed off)  Procedures:   None   Antimicrobials:  Anti-infectives (From admission, onward)   Start     Dose/Rate Route Frequency Ordered Stop   03/28/18 1100  Ampicillin-Sulbactam (UNASYN) 3 g in sodium chloride 0.9 % 100 mL IVPB     3 g 200 mL/hr over 30 Minutes Intravenous Every 8 hours 03/28/18 1009     03/24/18 1000  azithromycin (ZITHROMAX) 500 mg in sodium chloride 0.9 % 250 mL IVPB  Status:  Discontinued     500 mg 250 mL/hr over 60 Minutes Intravenous Every 24 hours 03/24/18 0439 03/28/18 1003   03/24/18 0800  cefTRIAXone (ROCEPHIN) 1 g in sodium chloride 0.9 % 100 mL IVPB  Status:  Discontinued     1 g 200 mL/hr over 30 Minutes Intravenous Every 24 hours 03/24/18 0439 03/28/18 1003   03/24/18 0145  vancomycin (VANCOCIN) 1,500 mg in sodium chloride 0.9 % 500 mL IVPB     1,500 mg 250 mL/hr over 120 Minutes Intravenous  Once 03/24/18 0131 03/24/18 0501   03/24/18 0130  ceFEPIme (MAXIPIME) 2 g in sodium chloride 0.9 % 100 mL IVPB     2 g 200 mL/hr over 30 Minutes Intravenous  Once 03/24/18 0127 03/24/18 0234   03/24/18 0130  metroNIDAZOLE (FLAGYL) IVPB 500 mg  Status:  Discontinued     500 mg 100 mL/hr over 60 Minutes Intravenous Every 8 hours 03/24/18 0127 03/26/18 1025   03/24/18 0130  vancomycin (VANCOCIN) IVPB 1000 mg/200 mL premix  Status:  Discontinued     1,000 mg 200 mL/hr over 60 Minutes Intravenous  Once 03/24/18 0127 03/24/18 0130     Subjective: Seen and examined at bedside and was laying in the bed with no issues.  States that she still feels a little bit short of breath and still has some wheezing.  No nausea or vomiting.  No lightheadedness or dizziness.  States that her legs are always swollen.  No other complaints  or concerns at this time and no family present at bedside.  Objective: Vitals:   03/29/18 2048 03/29/18 2059 03/30/18 0605 03/30/18 0811  BP:  (!) 145/80 (!) 152/77   Pulse:  95 80   Resp:  (!) 21 16   Temp:  98.1 F (36.7 C) 98.1 F (36.7 C)   TempSrc:  Oral Oral   SpO2: 95% 90% 94% 95%  Weight:      Height:        Intake/Output Summary (Last 24 hours) at 03/30/2018 1002 Last data filed at 03/30/2018 1610 Gross per 24 hour  Intake 700 ml  Output 1102 ml  Net -402 ml   Filed Weights   03/25/18 0500 03/27/18 0500 03/28/18 0500  Weight: 70.1 kg 71 kg 71 kg   Examination: Physical Exam:  Constitutional: Well-nourished, well-developed  overweight Caucasian female currently no acute distress laying in bed but still complaining of  some dyspnea Eyes: Sclera anicteric.  Lids and conjunctive are normal ENMT: External ears nose appear normal Neck: Appears supple no JVD Respiratory: Right diminished breath sounds and still has expiratory wheezing and coarse sounding lung fields.  There are crackles noted.  She has unlabored breathing but is wearing supplemental oxygen via nasal cannula Cardiovascular: Regular rate and rhythm.  No appreciable murmurs, rubs, gallops.  Has 1+ lower extremity edema with slightly worse on the left compared to the Abdomen: Soft, nontender, distended slightly secondary body habitus.  Bowel sounds present all 4 quadrants GU: Deferred Musculoskeletal: No contractures or cyanosis.  No joint deformities noted Skin: Skin is warm and dry with no appreciable rashes or lesions limited skin evaluation Neurologic: Cranial nerves II through XII grossly intact with no appreciable focal deficits Psychiatric: Slightly confused this morning but she is awake and alert.  Normal mood and pleasant affect  Data Reviewed: I have personally reviewed following labs and imaging studies  CBC: Recent Labs  Lab 03/26/18 0149 03/27/18 0207 03/28/18 0246 03/29/18 0519  03/30/18 0615  WBC 12.8* 18.9* 13.1* 11.3* 13.4*  NEUTROABS 11.6* 16.9* 10.9* 9.1* 11.8*  HGB 12.2 12.5 11.8* 12.3 12.3  HCT 36.8 38.4 36.5 37.9 37.4  MCV 95.3 94.1 94.3 93.8 94.0  PLT 154 198 206 221 242   Basic Metabolic Panel: Recent Labs  Lab 03/26/18 0149 03/27/18 0207 03/28/18 0246 03/29/18 0519 03/30/18 0221  NA 141 143 142 141 139  K 3.5 3.8 3.9 3.3* 3.7  CL 106 109 106 105 107  CO2 25 25 29 27 24   GLUCOSE 162* 163* 153* 148* 148*  BUN 8 10 11 14 15   CREATININE 0.69 0.72 0.76 0.71 0.75  CALCIUM 8.1* 8.4* 8.2* 8.0* 8.4*  MG 2.0 2.0 2.1 2.0 2.0  PHOS 1.4* 2.0* 2.3* 2.5 2.7   GFR: Estimated Creatinine Clearance: 50.3 mL/min (by C-G formula based on SCr of 0.75 mg/dL). Liver Function Tests: Recent Labs  Lab 03/26/18 0149 03/27/18 0207 03/28/18 0246 03/29/18 0519 03/30/18 0221  AST 33 24 23 25 24   ALT 36 34 28 30 29   ALKPHOS 49 53 48 40 40  BILITOT 0.9 0.6 0.7 0.7 1.0  PROT 5.9* 6.5 5.9* 5.6* 5.6*  ALBUMIN 2.6* 2.8* 2.6* 2.5* 2.5*   Recent Labs  Lab 03/23/18 2220  LIPASE 19   No results for input(s): AMMONIA in the last 168 hours. Coagulation Profile: No results for input(s): INR, PROTIME in the last 168 hours. Cardiac Enzymes: No results for input(s): CKTOTAL, CKMB, CKMBINDEX, TROPONINI in the last 168 hours. BNP (last 3 results) No results for input(s): PROBNP in the last 8760 hours. HbA1C: No results for input(s): HGBA1C in the last 72 hours. CBG: No results for input(s): GLUCAP in the last 168 hours. Lipid Profile: No results for input(s): CHOL, HDL, LDLCALC, TRIG, CHOLHDL, LDLDIRECT in the last 72 hours. Thyroid Function Tests: No results for input(s): TSH, T4TOTAL, FREET4, T3FREE, THYROIDAB in the last 72 hours. Anemia Panel: No results for input(s): VITAMINB12, FOLATE, FERRITIN, TIBC, IRON, RETICCTPCT in the last 72 hours. Sepsis Labs: Recent Labs  Lab 03/24/18 0040 03/24/18 0302  LATICACIDVEN 1.99* 1.45    Recent Results (from the  past 240 hour(s))  Blood culture (routine x 2)     Status: None   Collection Time: 03/23/18 10:15 PM  Result Value Ref Range Status   Specimen Description BLOOD LEFT ARM  Final   Special Requests   Final    BOTTLES DRAWN AEROBIC AND  ANAEROBIC Blood Culture adequate volume Performed at [facility], [address], [place], [place] [address]    Culture NO GROWTH 5 DAYS  Final   Report Status 03/29/2018 FINAL  Final  Blood culture (routine x 2)     Status: None   Collection Time: 03/24/18 12:32 AM  Result Value Ref Range Status   Specimen Description BLOOD RIGHT HAND  Final   Special Requests   Final    BOTTLES DRAWN AEROBIC ONLY Blood Culture results may not be optimal due to an excessive volume of blood received in culture bottles Performed at [facility], [address], [place], [place] [address]    Culture NO GROWTH 5 DAYS  Final   Report Status 03/29/2018 FINAL  Final  Respiratory Panel by PCR     Status: Abnormal   Collection Time: 03/24/18 10:28 AM  Result Value Ref Range Status   Adenovirus NOT DETECTED NOT DETECTED Final   Coronavirus 229E NOT DETECTED NOT DETECTED Final   Coronavirus HKU1 NOT DETECTED NOT DETECTED Final   Coronavirus NL63 NOT DETECTED NOT DETECTED Final   Coronavirus OC43 NOT DETECTED NOT DETECTED Final   Metapneumovirus NOT DETECTED NOT DETECTED Final   Rhinovirus / Enterovirus NOT DETECTED NOT DETECTED Final   Influenza A NOT DETECTED NOT DETECTED Final   Influenza B NOT DETECTED NOT DETECTED Final   Parainfluenza Virus 1 NOT DETECTED NOT DETECTED Final   Parainfluenza Virus 2 NOT DETECTED NOT DETECTED Final   Parainfluenza Virus 3 NOT DETECTED NOT DETECTED Final   Parainfluenza Virus 4 NOT DETECTED NOT DETECTED Final   Respiratory Syncytial Virus DETECTED (A) NOT DETECTED Final    Comment: CRITICAL RESULT CALLED TO, READ BACK BY AND VERIFIED WITH: K HARTGROVE RN 03/24/18 2111 JDW    Bordetella pertussis NOT DETECTED NOT DETECTED Final    Chlamydophila pneumoniae NOT DETECTED NOT DETECTED Final   Mycoplasma pneumoniae NOT DETECTED NOT DETECTED Final    Comment: Performed at [facility], [address], [place], [place] [address]  Culture, sputum-assessment     Status: None   Collection Time: 03/24/18  9:47 PM  Result Value Ref Range Status   Specimen Description EXPECTORATED SPUTUM  Final   Special Requests NONE  Final   Sputum evaluation   Final    THIS SPECIMEN IS ACCEPTABLE FOR SPUTUM CULTURE Performed at [facility], [address], [place], [place] [address]    Report Status 03/25/2018 FINAL  Final  Culture, respiratory     Status: None   Collection Time: 03/24/18  9:47 PM  Result Value Ref Range Status   Specimen Description EXPECTORATED SPUTUM  Final   Special Requests NONE Reflexed from [id number]  Final   Gram Stain   Final    ABUNDANT WBC PRESENT,BOTH PMN AND MONONUCLEAR MODERATE GRAM POSITIVE COCCI MODERATE GRAM VARIABLE ROD    Culture   Final    FEW Consistent with normal respiratory flora. Performed at [facility], [address], [place], [place] [address]    Report Status 03/27/2018 FINAL  Final  Culture, Urine     Status: None   Collection Time: 03/25/18  6:04 PM  Result Value Ref Range Status   Specimen Description URINE, RANDOM  Final   Special Requests NONE  Final   Culture   Final    NO GROWTH Performed at [facility], [address] [place], [place] [address]    Report Status  03/26/2018 FINAL  Final    Radiology Studies: Dg Chest Port 1 View  Result Date: 03/30/2018 CLINICAL DATA:  85 year old female with shortness of breath. Subsequent encounter. EXAM: PORTABLE CHEST 1 VIEW COMPARISON:  03/29/2018 chest x-ray FINDINGS: Cardiomegaly. Pulmonary edema with cephalization of vasculature suspected. Additionally, consolidation left base may represent infiltrate, atelectasis and/or left-sided pleural effusion. Calcified aorta. No pneumothorax noted. IMPRESSION: 1.  Cardiomegaly. 2. Pulmonary edema with cephalization of vasculature suspected. 3. New consolidation left base may represent infiltrate, atelectasis and/or left-sided pleural effusion. 4.  Aortic Atherosclerosis (ICD10-I70.0). Electronically Signed   By: Lacy DuverneySteven  Olson M.D.   On: 03/30/2018 07:52   Dg Chest Port 1 View  Result Date: 03/29/2018 CLINICAL DATA:  Shortness of breath. EXAM: PORTABLE CHEST 1 VIEW COMPARISON:  03/28/2018 FINDINGS: Normal heart size. Aortic atherosclerosis. Bilateral upper lobe interstitial opacities appears similar to previous exam. No new findings. IMPRESSION: 1. Persistent bilateral upper lobe interstitial opacities. Unchanged. Electronically Signed   By: Signa Kellaylor  Stroud M.D.   On: 03/29/2018 09:09   Scheduled Meds: . budesonide (PULMICORT) nebulizer solution  0.25 mg Nebulization BID  . furosemide  40 mg Intravenous Once  . guaiFENesin  1,200 mg Oral BID  . heparin injection (subcutaneous)  5,000 Units Subcutaneous Q8H  . ipratropium-albuterol  3 mL Nebulization BID  . latanoprost  1 drop Left Eye QHS  . levothyroxine  88 mcg Oral Q0600  . methylPREDNISolone (SOLU-MEDROL) injection  60 mg Intravenous Daily  . pantoprazole  40 mg Oral Daily  . simvastatin  20 mg Oral q1800   Continuous Infusions: . ampicillin-sulbactam (UNASYN) IV 3 g (03/30/18 0342)    LOS: 6 days   Merlene Laughtermair Latif Fleurette Woolbright, DO [facility] PAGER is on AMION  If 7PM-7AM, please contact night-coverage [email or web address] Password [email or web address] 03/30/2018, 10:02 AM

## 2018-03-30 NOTE — Care Management Important Message (Signed)
Important Message  Patient Details  Name: Jordan Horne MRN: [id number] Date of Birth: [birth year]   Medicare Important Message Given:  Yes    [name] 03/30/2018, 4:18 PM

## 2018-03-30 NOTE — Progress Notes (Signed)
PT Cancellation Note  Patient Details Name: ABCDE SPEZIA MRN: [id number] DOB: [birth year]   Cancelled Treatment:    Reason Eval/Treat Not Completed: Patient at procedure or test/unavailable, pt at radiology, will check back as time allows.    Kasondra Junod L Genese Quebedeaux 03/30/2018, 12:07 PM

## 2018-03-30 NOTE — Progress Notes (Signed)
Modified Barium Swallow Progress Note  Patient Details  Name: Jordan Horne MRN: [id number] Date of Birth: 1932/12/14  Today's Date: 03/30/2018  Modified Barium Swallow completed.  Full report located under Chart Review in the Imaging Section.  Brief recommendations include the following:  Clinical Impression  Pt presents with a mild pharyngeal dysphagia with intermittent, trace aspiration of thin liquids observed prior to swallow onset.  Thin liquids passed into pyriforms and spilled into the airway (both anteriorly and posteriorly) before laryngeal vestibule closure.  A cued cough was not effective in removing aspirate.  Interestingly, the first compensatory strategy attempted - a head turn toward the left shoulder during the swallow- was consistently effective in sealing off the larynx and preventing penetration/aspiration.  There was inconsistent vallecular residue post-swallow.  Age-related changes in vertebrae were present but did not appear to impact function. Recommend continuing current diet (regular/thins); head turn to left when swallowing liquids.  Etiology of deficits not clear.  SLP will follow acutely for education/safety.    Swallow Evaluation Recommendations       SLP Diet Recommendations: Regular solids;Thin liquid   Liquid Administration via: Cup;Straw   Medication Administration: Crushed with puree   Supervision: Staff to assist with self feeding   Compensations: Other (Comment)(head turn to left when swallowing liquids)       Oral Care Recommendations: Oral care BID      Darshay Deupree L. Samson Fredericouture, MA CCC/SLP Acute Rehabilitation Services Office number [phone] Pager [phone]   Blenda MountsCouture, Betzalel Umbarger Laurice 03/30/2018,1:40 PM

## 2018-03-30 NOTE — Plan of Care (Signed)
  Problem: Skin Integrity: Goal: Risk for impaired skin integrity will decrease Outcome: Progressing   Problem: Clinical Measurements: Goal: Ability to maintain clinical measurements within normal limits will improve Outcome: Progressing   Problem: Nutrition: Goal: Adequate nutrition will be maintained Outcome: Progressing   Problem: Elimination: Goal: Will not experience complications related to bowel motility Outcome: Progressing

## 2018-03-31 ENCOUNTER — Inpatient Hospital Stay (HOSPITAL_COMMUNITY): Payer: Medicare Other

## 2018-03-31 ENCOUNTER — Other Ambulatory Visit (HOSPITAL_COMMUNITY): Payer: Medicare Other

## 2018-03-31 DIAGNOSIS — I5033 Acute on chronic diastolic (congestive) heart failure: Secondary | ICD-10-CM

## 2018-03-31 DIAGNOSIS — I351 Nonrheumatic aortic (valve) insufficiency: Secondary | ICD-10-CM

## 2018-03-31 LAB — CBC WITH DIFFERENTIAL/PLATELET
Abs Immature Granulocytes: 0 10*3/uL (ref 0.00–0.07)
Basophils Absolute: 0.1 10*3/uL (ref 0.0–0.1)
Basophils Relative: 1 %
EOS ABS: 0 10*3/uL (ref 0.0–0.5)
Eosinophils Relative: 0 %
HCT: 39 % (ref 36.0–46.0)
Hemoglobin: 12.6 g/dL (ref 12.0–15.0)
LYMPHS ABS: 0.4 10*3/uL — AB (ref 0.7–4.0)
Lymphocytes Relative: 3 %
MCH: 30.3 pg (ref 26.0–34.0)
MCHC: 32.3 g/dL (ref 30.0–36.0)
MCV: 93.8 fL (ref 80.0–100.0)
Monocytes Absolute: 0.6 10*3/uL (ref 0.1–1.0)
Monocytes Relative: 5 %
NRBC: 0.2 % (ref 0.0–0.2)
Neutro Abs: 10.9 10*3/uL — ABNORMAL HIGH (ref 1.7–7.7)
Neutrophils Relative %: 91 %
Platelets: 254 10*3/uL (ref 150–400)
RBC: 4.16 MIL/uL (ref 3.87–5.11)
RDW: 13.4 % (ref 11.5–15.5)
WBC: 12 10*3/uL — ABNORMAL HIGH (ref 4.0–10.5)
nRBC: 0 /100 WBC

## 2018-03-31 LAB — COMPREHENSIVE METABOLIC PANEL
ALT: 30 U/L (ref 0–44)
AST: 20 U/L (ref 15–41)
Albumin: 2.4 g/dL — ABNORMAL LOW (ref 3.5–5.0)
Alkaline Phosphatase: 37 U/L — ABNORMAL LOW (ref 38–126)
Anion gap: 6 (ref 5–15)
BUN: 20 mg/dL (ref 8–23)
CO2: 29 mmol/L (ref 22–32)
Calcium: 8.2 mg/dL — ABNORMAL LOW (ref 8.9–10.3)
Chloride: 103 mmol/L (ref 98–111)
Creatinine, Ser: 0.8 mg/dL (ref 0.44–1.00)
GFR calc Af Amer: >60 mL/min (ref >60)
GFR calc non Af Amer: >60 mL/min (ref >60)
Glucose, Bld: 140 mg/dL — ABNORMAL HIGH (ref 70–99)
POTASSIUM: 3.7 mmol/L (ref 3.5–5.1)
Sodium: 138 mmol/L (ref 135–145)
Total Bilirubin: 1 mg/dL (ref 0.3–1.2)
Total Protein: 5.1 g/dL — ABNORMAL LOW (ref 6.5–8.1)

## 2018-03-31 LAB — MAGNESIUM: Magnesium: 2.1 mg/dL (ref 1.7–2.4)

## 2018-03-31 LAB — ECHOCARDIOGRAM COMPLETE
Height: 63 in
Weight: 2320 oz

## 2018-03-31 LAB — PHOSPHORUS: PHOSPHORUS: 3.4 mg/dL (ref 2.5–4.6)

## 2018-03-31 MED ORDER — DIAZEPAM 5 MG PO TABS: 5.0000 mg | ORAL_TABLET | Freq: Four times a day (QID) | ORAL | 0 refills | Status: DC | PRN

## 2018-03-31 MED ORDER — IPRATROPIUM-ALBUTEROL 0.5-2.5 (3) MG/3ML IN SOLN: 3.0000 mL | Freq: Four times a day (QID) | RESPIRATORY_TRACT | 0 refills | Status: DC | PRN

## 2018-03-31 MED ORDER — GUAIFENESIN ER 600 MG PO TB12: 600.0000 mg | ORAL_TABLET | Freq: Two times a day (BID) | ORAL | 0 refills | Status: DC

## 2018-03-31 MED ORDER — PREDNISONE 10 MG (21) PO TBPK: ORAL_TABLET | ORAL | 0 refills | Status: DC

## 2018-03-31 MED ORDER — ONDANSETRON HCL 4 MG PO TABS: 4.0000 mg | ORAL_TABLET | Freq: Four times a day (QID) | ORAL | 0 refills | Status: DC | PRN

## 2018-03-31 MED ORDER — FUROSEMIDE 20 MG PO TABS: 20.0000 mg | ORAL_TABLET | Freq: Every day | ORAL | 11 refills | Status: DC

## 2018-03-31 MED ORDER — PANTOPRAZOLE SODIUM 40 MG PO TBEC: 40.0000 mg | DELAYED_RELEASE_TABLET | Freq: Every day | ORAL | 0 refills | Status: DC

## 2018-03-31 MED ORDER — FUROSEMIDE 10 MG/ML IJ SOLN
40.0000 mg | Freq: Once | INTRAMUSCULAR | Status: AC
  Administered 2018-03-31: 40 mg via INTRAVENOUS
  Filled 2018-03-31: qty 4

## 2018-03-31 NOTE — Progress Notes (Addendum)
Occupational Therapy Treatment Patient Details Name: Jordan Horne MRN: [id number] DOB: February 26, 1933 Today's Date: 03/31/2018    History of present illness The patient is in 85 year old Caucasian female with a past medical history hypothyroidism,  hyperlipidemia, glaucoma, hypertension, who presented to the ED 03/24/18 with a chief complaint of abdominal pain. Pt had SBO now resolved.  Pt has clinical presentation c/w pneumonia.  Popsitive for respiratory syncytial virus.   OT comments  Pt agreeable to therapy and wanting to use bathroom. Pt performing scooting to edge of chair with supervisionA; transfers with minguardA and verbal cues for proper hand placement. Pt performing ADL functional mobility with RW and minguardA. Pt a/o today x3. Pt performing toilet hygiene and clothing management standing at commode with minguardA for LB clothing. Pt performing grooming at sink with set-upA. Sao2 levels remain >94% on 2L [place]. Pt would benefit from continued OT skilled services for ADL, mobility and safety in [facility] setting as pt has progressed and meeting goals.   Follow Up Recommendations  Home health OT;Supervision/Assistance - 24 hour    Equipment Recommendations       Recommendations for Other Services      Precautions / Restrictions Precautions Precautions: Fall Precaution Comments: monitor SpO2 Restrictions Weight Bearing Restrictions: No       Mobility Bed Mobility Overal bed mobility: Needs Assistance Bed Mobility: Supine to Sit     Supine to sit: Supervision        Transfers Overall transfer level: Needs assistance Equipment used: Rolling walker (2 wheeled) Transfers: Sit to/from Stand Sit to Stand: Min guard         General transfer comment: vc's for hand placement with each transfer    Balance Overall balance assessment: Needs assistance Sitting-balance support: Feet supported;No upper extremity supported Sitting balance-Leahy Scale: Good       Standing  balance-Leahy Scale: Fair Standing balance comment: pt able to stand at sink and wash hands without UE support             High level balance activites: Side stepping;Backward walking;Direction changes             ADL either performed or assessed with clinical judgement   ADL Overall ADL's : Needs assistance/impaired Eating/Feeding: Set up;Sitting               Upper Body Dressing : Set up;Sitting   Lower Body Dressing: Supervision/safety;Set up;Sit to/from stand   Toilet Transfer: Lawyer and Hygiene: Min guard;Cueing for safety;Sitting/lateral lean       Functional mobility during ADLs: Min guard;Cueing for safety;Rolling walker General ADL Comments: Pt performing with increased independence today     Vision   Vision Assessment?: No apparent visual deficits   Perception     Praxis      Cognition Arousal/Alertness: Awake/alert Behavior During Therapy: WFL for tasks assessed/performed Overall Cognitive Status: No family/caregiver present to determine baseline cognitive functioning Area of Impairment: Orientation;Safety/judgement;Awareness                       Following Commands: Follows multi-step commands consistently Safety/Judgement: Decreased awareness of safety     General Comments: pt appropriate throughout session        Exercises     Shoulder Instructions       General Comments      Pertinent Vitals/ Pain       Pain Assessment: No/denies pain  Home Living  Prior Functioning/Environment              Frequency  Min 2X/week        Progress Toward Goals  OT Goals(current goals can now be found in the care plan section)  Progress towards OT goals: Progressing toward goals  Acute Rehab OT Goals Patient Stated Goal: return home OT Goal Formulation: With patient Time For Goal Achievement: 04/10/18 Potential to  Achieve Goals: Good ADL Goals Pt Will Perform Grooming: with supervision Pt Will Perform Lower Body Dressing: with supervision Pt Will Transfer to Toilet: with supervision Additional ADL Goal #1: Pt will perform ADL functional transfers with minguardA  Plan Discharge plan needs to be updated    Co-evaluation                 AM-PAC OT "6 Clicks" Daily Activity     Outcome Measure   Help from another person eating meals?: None Help from another person taking care of personal grooming?: None Help from another person toileting, which includes using toliet, bedpan, or urinal?: A Little Help from another person bathing (including washing, rinsing, drying)?: A Little Help from another person to put on and taking off regular upper body clothing?: None Help from another person to put on and taking off regular lower body clothing?: A Little 6 Click Score: 21    End of Session Equipment Utilized During Treatment: Gait belt;Rolling walker  OT Visit Diagnosis: Unsteadiness on feet (R26.81);Muscle weakness (generalized) (M62.81)   Activity Tolerance Patient tolerated treatment well   Patient Left in chair;with bed alarm set;with chair alarm set   Nurse Communication Mobility status        Time: 1005([id number])-1030 OT Time Calculation (min): 25 min  Charges: OT General Charges $OT Visit: 1 Visit OT Treatments $Self Care/Home Management : 38-52 mins  Revonda Standard Cecil Cranker) Glendell Docker OTR/L Acute Rehabilitation Services Pager: [phone] Office: [phone]    Sandrea Hughs 03/31/2018, 10:41 AM

## 2018-03-31 NOTE — Progress Notes (Signed)
  Echocardiogram 2D Echocardiogram has been performed.  Leta Jungling M 03/31/2018, 2:26 PM

## 2018-03-31 NOTE — Progress Notes (Signed)
Discharged to [facility] son and husband driving her to the facility. Report given to Alex,LPN'. Personal belongings, discharged instructions given to patient

## 2018-03-31 NOTE — Social Work (Signed)
CSW continuing to follow for support with disposition when medically appropriate.  Raybon Conard, MSW, LCSWA [facility] Clinical Social Work [phone]   

## 2018-03-31 NOTE — Social Work (Signed)
Clinical Social Worker facilitated patient discharge including contacting patient family and facility to confirm patient discharge plans.  Clinical information faxed to facility and family agreeable with plan.  CSW spoke with pt husband and he will transport pt to [facility]. RN to call [phone]  with report prior to discharge.  Clinical Social Worker will sign off for now as social work intervention is no longer needed. Please consult [place] again if new need arises.  [name], MSW, [facility] Clinical Social Worker [phone]

## 2018-03-31 NOTE — Clinical Social Work Placement (Signed)
   CLINICAL SOCIAL WORK PLACEMENT  NOTE RN to call report to [phone] [facility] [address]  Date:  03/31/2018  Patient Details  Name: Jordan Horne MRN: [id number] Date of Birth: 04/13/1932  Clinical Social Work is seeking post-discharge placement for this patient at the Skilled  Nursing Facility level of care (*CSW will initial, date and re-position this form in  chart as items are completed):  Yes   Patient/family provided with [facility] Clinical Social Work Department's list of facilities offering this level of care within the geographic area requested by the patient (or if unable, by the patient's family).  Yes   Patient/family informed of their freedom to choose among providers that offer the needed level of care, that participate in Medicare, Medicaid or managed care program needed by the patient, have an available bed and are willing to accept the patient.  Yes   Patient/family informed of [facility]'s ownership interest [facility] and [facility], as well as of the fact that they are under no obligation to receive care at these facilities.  PASRR submitted to EDS on       PASRR number received on       Existing PASRR number confirmed on       FL2 transmitted to all facilities in geographic area requested by pt/family on 03/27/18     FL2 transmitted to all facilities within larger geographic area on       Patient informed that his/her managed care company has contracts with or will negotiate with certain facilities, including the following:        Yes   Patient/family informed of bed offers received.  Patient chooses bed at [facility]     Physician recommends and patient chooses bed at      Patient to be transferred to [facility] on 03/31/18.  Patient to be transferred to facility by pt family in private car     Patient family notified on 03/31/18 of transfer.  Name of family member notified:  pt husband,  Jordan Horne     PHYSICIAN       Additional Comment:    _______________________________________________ Jordan Horne, LCSWA 03/31/2018, 3:49 PM

## 2018-03-31 NOTE — Progress Notes (Signed)
Pharmacy Antibiotic Note  Jordan Horne is a 85 y.o. female admitted on 03/23/2018 and started on antibiotics for PNA. Now with possible aspiration and Pharmacy has been consulted for Unasyn dosing.  She is positive for RSV.  Today is day #8 of total antibiotics and day #4 of Unasyn.  Renal function is stable, afebrile, WBC trending down.   Plan: Continue Unasyn 3g IV Q8H Consider adding stop date to order Pharmacy will sign off as dosage adjustment is likely unnecessary.  Thank you for the consult!  Height: 5\' 3"  (160 cm) Weight: 145 lb (65.8 kg) IBW/kg (Calculated) : 52.4  Temp (24hrs), Avg:97.8 F (36.6 C), Min:97.6 F (36.4 C), Max:97.9 F (36.6 C)  Recent Labs  Lab 03/27/18 0207 03/28/18 0246 03/29/18 0519 03/30/18 0221 03/30/18 0615 03/31/18 0445  WBC 18.9* 13.1* 11.3*  --  13.4* 12.0*  CREATININE 0.72 0.76 0.71 0.75  --  0.80    Estimated Creatinine Clearance: 48.6 mL/min (by C-G formula based on SCr of 0.8 mg/dL).    Allergies  Allergen Reactions  . Betadine [Povidone Iodine] Itching     Azith 12/31 >> 1/3 CTX 12/31 >> 1/4 Flagyl 12/31 >> 1/2 Vanc x1 12/31 Unasyn 1/4 >>   12/31 pres panel PCR - RSV 12/31 sputum - negative 12/30 BCx - negative 1/1 UCx - negative  Mia Milan D. Laney Potashang, PharmD, BCPS, BCCCP 03/31/2018, 1:33 PM

## 2018-03-31 NOTE — Discharge Summary (Signed)
Physician Discharge Summary  Jordan Horne:[id number] DOB: Apr 06, 1932 DOA: 03/23/2018  PCP: Dema Severin, NP  Admit date: 03/23/2018 Discharge date: 03/31/2018  Admitted From: Home Disposition: SNF  Recommendations for Outpatient Follow-up:  1. Follow up with PCP in 1-2 weeks 2. Follow up with Cardiology within 1 week and CHF Clinic 3. Repeat CXR in 3-6 weeks  4. Please obtain CMP/CBC, Mag, Phos in one week 5. 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 She states that she is been admitting abdominal pain for last few months which is been persistent and has had nausea and vomiting at times. Last bowel movement was a few days ago. She also states that she is having a productive sputum for the last few days and exhibiting fevers and chills. CT of the abdomen was done which showed a possible volvulus at the ileum and general surgery was consulted. She is started on empiric antibiotics for a Pneumonia and was found to be RSV +.  Her mentation is waxing and waning and she requires intermittent safety sitters especially at night because she continues to try to climb out of bed.  Appeared volume overloaded so we will be checking an echocardiogram and obtain a BNP and it revealed evidence of Diastolic CHF.  Still had concerns for aspiration so underwent MBS and SLP recommended Regular Diet Thin Liquids. Diuresed with IV Lasix and improved with her volume status.  Her pneumonia was treated and her antibiotics were  discontinued.  She was deemed likely stable will need to follow-up with PCP and cardiology in outpatient setting.  She will require oxygen at discharge and will have facility wean oxygen as tolerated.  Discharge Diagnoses:  Principal Problem:   SBO (small bowel obstruction) (HCC) Active Problems:   Hypothyroidism   Hypercholesterolemia   CAP (community acquired pneumonia)  Abdominal Painwith possible Volvulus, improved -Appreciate general surgery consult. -Patient was NPOand General Surgery was recommending continuing Bowel rest and observation but since Abdomen is soft and she had a BM the day before yesterday General Surgery advanced diet to a CLD yesterday and to a Soft Diet this AM -Per General Surgerydoes not appear to be a full 360 degree twist and does not be obstructing the bowel lumen or mesentery -Repeat KUB showed No evidence of bowel obstruction or ileus. -Further recommendations per general surgery they have signed off and patient is on a soft diet -We will obtain a speech evaluation for suspected aspiration and patient is to undergo an MBS today.  For now we will continue on soft diet per recommendations of SLP; MBS done and recommending Regular Diet with Thin Liquids   Community-Acquired pneumonia with likely Concomitant COPD Exacerbation and Bronchitis with ? Aspiration Component  -Patient'ssymptoms are consistent with community-acquired pneumonia though chest x-ray and CAT scan does not show any definite infiltrates though does show some basilar atelectasis.  -CXR yesterday showed cardiomegaly with pulmonary edema and cephalization of the vasculature suspected.  There is also new consolidation of the left base that may represent infiltrate, atelectasis and/or a left-sided pleural effusion." -Follow sputum cultures -Influenza PCRNegative but Respiratory  Virus Panel Positive for RSV -Urine for Legionella Ag pending andstrep antigen Negative -Could also be  fromAspiration. Will need to check SLP as diet has been advanced and they recommend continuing soft diet for now and recommending checking a formal MBS today given the concern for aspiration still and they recommended Regular Diet with Thin Liquids  -Abx now Stopped s/p Ceftriaxone and Azithromycin and then Unasyn  -C/w p.o. guaifenesin 600mg  twice daily for 5 days and discontinue guaifenesin-dextromethorphan 10 mL's every 8 PRN for cough -C/w flutter valve and incentive spirometer -IVF is now D/C'd -WBC went from 11.3 to 13.4 -> 12.0 -Scheduled DuoNeb85mL's every 6h and was changed to BID; Will D/C with DuoNeb 3 mL q6hprn  -Added Budesonide 0.25 mg BID  -Solu-Medrol is now being weaned and we went to IV 60 daily and will D/C on Sterapred Taper  -Will get PT/OT to evaluate and Treat; Recommending SNF patient is refusing SNF and wanted to go home and states that she has a hospital bed but unclear because her mental status is waxing and waning so we will need to discuss with family -D/C Safety Sitter so she can go to SNF and  -Given another IV Lasix 40 mg x1 to see if that helps with Breathing as patient appeared mildly volume overloaded with trace lower extremity edema; will check echocardiogram and BNP to rule out heart failure component; ECHO did confirm CHF as below -Repeat CXR in 3-6 weeks   Acute on Chronic Diastolic CHF -Patient remained volume overloaded and chest x-ray yesterdaymorning showed cardiomegaly and pulmonary edema with cephalization of the vasculature suspected. There is a new consolidation of the left base that may represent infiltrate, atelectasis and/or a left-sided pleural effusion. -CXR this AM showed "Interval clearing of bilateral upper lobe infiltrates/edema. Progression of bibasilar atelectasis." -Patient had 1+ lower extremity edema bilaterally slightly worse on the left than compared to the right but it is improved today  -We will check BNP and it was  776.5 -Checked Echocardiogram and showed EF of 55 to 60% with wall motion was normal.  There is no regional wall motion abnormalities.  Features are consistent with a pseudo-normal left ventricular filling pattern with concomitant abnormal relaxation and increased filling pressure which was grade 2 diastolic dysfunction.  The aortic valve showed mild regurgitation -Resumed diuresis with IV Lasix 40 mg once again this AM and will place on 20 mg po Daily for patient  -Strict I's and O's and Daily Weights -Patient is +1.032 L positive since admission and from diuresis  -We will place on low-dose Rosenbower 20 mg p.o. daily and have the patient follow-up with CHF clinic -Weight is down now  and went from 152 to 156 and is now 145 -Continue to monitor volume status carefully and refer to CHF Ambulatory Clinic -Repeat CXR in 3-6 weeks   Acute Respiratory Failure with Hypoxia -In the setting as above from Pneumonia and ?CHF -Continuous pulse oximetry and maintain O2 saturation greater than 90% -Continue with supplemental oxygen via nasal cannula and wean O2 as tolerated -Continue treatment as above and will need a home Ambulatory screen prior to discharge -Patient had a ambulatory O2 study done yesterday and on room air she was ranging from 82 to 88% and was placed on 3 L and had saturations of 97%.  She had noted shortness of breath without using oxygen  -Repeat chest x-ray in 3 to 6 weeks and continue diuresis  LacticAcidosis -Improved and lactate level went from 1.9 and  is now 1.45 -Continue IV fluid hydration as above has now been discontinued  Abnormal LFT's, now improved -? Reactive -LFTs are normalized  -Checked RUQ U/S and Acute Hepatitis Panel -RUQ showed Normal examination post cholecystectomy. -Acute Hepatitis Panel Negative -Resumed Simvastatin  -Continue to Monitor and Trend CMP  Hypothyroidism -Iatrogenic givenpostoperatively -C/w po Levothyroxine 88 mcg  Daily  Hyperlipidemia -Continue home medicationsof Simvastatinwhen patient can take orally and LFTs are improved and stable; -Resumed Simvastatin as LFTs have improved  MildHyperglycemia -Continue to Follow metabolic panel if still elevated may check hemoglobin A1c. -Blood Sugar on CMP/BMP ranging from 103-163 -Continue to Monitor and place on Sensitive Novolog SSI if Blood Sugars are consistently elevated  Hypokalemia improved -Was 3.3 yesterday AM and is now 3.7 -Continue to monitor and replete as necessary -Repeat CMP in a.m.  Hypophosphatemia -Phos this morning was 3.4 -Continue to monitor and replete as necessary -Repeat Phosphorous level in the a.m.  History ofGlaucoma -Continue Latanoprost 1 drop left eye nightly  GERD -Continue with po Pantoprazole 40 mg po Daily   Delirium/Sundowining/Mild Confusion -In the setting of Infection, Hypoxia and Steroid Use; Wean Steroids, patient's family states that she has always intermittently being confused -Re-Orient Frequently  Secondary school teacher currently now Discontined -Delirium Precautions  -Family states that her baseline is that she is always intermittently confused -Will need to Monitor Closely at SNF   Leukocytosis -Worsenied in the setting of IV steroid demargination but now improving -Continue monitor for signs and symptoms infection -WBC has slightly worsened to 13.4 but in the setting of IV steroids but is now 12.0 -Continue monitor and treated infection as above -Repeat CBC at SNF  Discharge Instructions Discharge Instructions    (HEART FAILURE PATIENTS) Call MD:  Anytime you have any of the following symptoms: 1) 3 pound weight gain in 24 hours or 5 pounds in 1 week 2) shortness of breath, with or without a dry hacking cough 3) swelling in the hands, feet or stomach 4) if you have to sleep on extra pillows at night in order to breathe.   Complete by:  As directed    AMB referral to CHF clinic    Complete by:  As directed    Call MD for:  difficulty breathing, headache or visual disturbances   Complete by:  As directed    Call MD for:  extreme fatigue   Complete by:  As directed    Call MD for:  hives   Complete by:  As directed    Call MD for:  persistant dizziness or light-headedness   Complete by:  As directed    Call MD for:  persistant nausea and vomiting   Complete by:  As directed    Call MD for:  redness, tenderness, or signs of infection (pain, swelling, redness, odor or green/yellow discharge around incision site)   Complete by:  As directed    Call MD for:  severe uncontrolled pain   Complete by:  As directed    Call MD for:  temperature >100.4   Complete by:  As directed    Diet - low sodium heart healthy   Complete by:  As directed    Regular Diet with Thin Liquids and 1500 mL Fluid Restriction   Discharge instructions   Complete by:  As directed    You were cared for by a hospitalist during your hospital stay. If you have any questions about your discharge medications or the care you received while you were in  the hospital after you are discharged, you can call the unit and ask to speak with the hospitalist on call if the hospitalist that took care of you is not available. Once you are discharged, your primary care physician will handle any further medical issues. Please note that NO REFILLS for any discharge medications will be authorized once you are discharged, as it is imperative that you return to your primary care physician (or establish a relationship with a primary care physician if you do not have one) for your aftercare needs so that they can reassess your need for medications and monitor your lab values.  Follow up with PCP and Cardiology in the outpatient setting. Take all medications as prescribed. If symptoms change or worsen please return to the ED for evaluation   Increase activity slowly   Complete by:  As directed      Allergies as of 03/31/2018       Reactions   Betadine [povidone Iodine] Itching      Medication List    TAKE these medications   diazepam 5 MG tablet Commonly known as:  VALIUM Take 1 tablet (5 mg total) by mouth every 6 (six) hours as needed for anxiety. What changed:  reasons to take this   furosemide 20 MG tablet Commonly known as:  LASIX Take 1 tablet (20 mg total) by mouth daily.   guaiFENesin 600 MG 12 hr tablet Commonly known as:  MUCINEX Take 1 tablet (600 mg total) by mouth 2 (two) times daily.   ipratropium-albuterol 0.5-2.5 (3) MG/3ML Soln Commonly known as:  DUONEB Take 3 mLs by nebulization every 6 (six) hours as needed.   latanoprost 0.005 % ophthalmic solution Commonly known as:  XALATAN Place 1 drop into the left eye at bedtime.   ondansetron 4 MG tablet Commonly known as:  ZOFRAN Take 1 tablet (4 mg total) by mouth every 6 (six) hours as needed for nausea.   pantoprazole 40 MG tablet Commonly known as:  PROTONIX Take 1 tablet (40 mg total) by mouth daily.   predniSONE 10 MG (21) Tbpk tablet Commonly known as:  STERAPRED UNI-PAK 21 TAB Take 6 pills on day 1, 5 pills on day 2, 4 pills on day 3, 3 pills on day 4, 2 pills on day 5, 1 pill on day 6 and stop on day 7   simvastatin 20 MG tablet Commonly known as:  ZOCOR Take 20 mg by mouth daily.   SYNTHROID 88 MCG tablet Generic drug:  levothyroxine Take 88 mcg by mouth daily before breakfast.      Contact information for after-discharge care    Destination    [facility] Preferred SNF .   Service:  Skilled Nursing Contact information: [address] [place] [id number] [phone]             Allergies  Allergen Reactions  . Betadine [Povidone Iodine] Itching   Consultations:  General Surgery  Procedures/Studies: Dg Chest 2 View  Result Date: 03/23/2018 CLINICAL DATA:  Cough, short of breath EXAM: CHEST - 2 VIEW COMPARISON:  08/07/2017 FINDINGS: Trace pleural effusions.  Mild cardiomegaly with vascular congestion. Tortuous calcified aorta. Patchy atelectasis at the left base. No pneumothorax. IMPRESSION: 1. Cardiomegaly with vascular congestion and trace pleural effusion 2. Patchy atelectasis at the left base Electronically Signed   By: Jasmine PangKim  Fujinaga M.D.   On: 03/23/2018 23:54   Ct Abdomen Pelvis W Contrast  Result Date: 03/24/2018 CLINICAL DATA:  Acute onset of  right lower quadrant abdominal pain and nausea. EXAM: CT ABDOMEN AND PELVIS WITH CONTRAST TECHNIQUE: Multidetector CT imaging of the abdomen and pelvis was performed using the standard protocol following bolus administration of intravenous contrast. CONTRAST:  OMNIPAQUE IOHEXOL 300 MG/ML  SOLN COMPARISON:  CT of the abdomen and pelvis performed 07/01/2017 FINDINGS: Lower chest: Mild bibasilar opacities likely reflect atelectasis. The visualized portions of the mediastinum are unremarkable. Hepatobiliary: The liver is unremarkable in appearance. The patient is status post cholecystectomy, with clips noted at the gallbladder fossa. The common bile duct remains normal in caliber. Pancreas: The pancreas is within normal limits. Spleen: The spleen is unremarkable in appearance. Adrenals/Urinary Tract: The adrenal glands are unremarkable in appearance. Scattered bilateral renal cysts are seen. There is no evidence of hydronephrosis. No renal or ureteral stones are identified. No perinephric stranding is appreciated. Stomach/Bowel: The stomach is unremarkable in appearance. There is unusual mild focal twisting of the distal ileum and ileocecal junction at the right lower quadrant, which may correspond to the patient's symptoms. There is no evidence for bowel obstruction at this time. The appendix is normal in caliber, without evidence of appendicitis. The colon is unremarkable in appearance. Vascular/Lymphatic: Scattered calcification is seen along the abdominal aorta and its branches. The abdominal aorta is otherwise  grossly unremarkable. The inferior vena cava is grossly unremarkable. No retroperitoneal lymphadenopathy is seen. No pelvic sidewall lymphadenopathy is identified. Reproductive: The bladder is mildly distended and grossly unremarkable. The patient is status post hysterectomy. No suspicious adnexal masses are seen. The right ovary is grossly unremarkable in appearance. Other: No additional soft tissue abnormalities are seen. Musculoskeletal: No acute osseous abnormalities are identified. Multilevel vacuum phenomenon is noted along the lower thoracic and lumbar spine. The visualized musculature is unremarkable in appearance. IMPRESSION: 1. Unusual mild focal twisting of the distal ileum and ileocecal junction at the right lower quadrant, suggesting mild volvulus, which may correspond to the patient's symptoms. No evidence for bowel obstruction at this time. 2. Scattered bilateral renal cysts. 3. Mild bibasilar airspace opacities likely reflect atelectasis. Aortic Atherosclerosis (ICD10-I70.0). Electronically Signed   By: Roanna Raider M.D.   On: 03/24/2018 00:28   Dg Chest Port 1 View  Result Date: 03/31/2018 CLINICAL DATA:  Short of breath EXAM: PORTABLE CHEST 1 VIEW COMPARISON:  03/30/2018 FINDINGS: Hypoventilation with increase in bibasilar atelectasis. Cardiac enlargement. Improvement in bilateral upper lobe airspace disease likely due to clearing pneumonia or edema. Negative for edema on today's study. Normal vascularity. Atherosclerotic aortic arch. IMPRESSION: Interval clearing of bilateral upper lobe infiltrates/edema. Progression of bibasilar atelectasis. Electronically Signed   By: Marlan Palau M.D.   On: 03/31/2018 06:49   Dg Chest Port 1 View  Result Date: 03/30/2018 CLINICAL DATA:  85 year old female with shortness of breath. Subsequent encounter. EXAM: PORTABLE CHEST 1 VIEW COMPARISON:  03/29/2018 chest x-ray FINDINGS: Cardiomegaly. Pulmonary edema with cephalization of vasculature suspected.  Additionally, consolidation left base may represent infiltrate, atelectasis and/or left-sided pleural effusion. Calcified aorta. No pneumothorax noted. IMPRESSION: 1. Cardiomegaly. 2. Pulmonary edema with cephalization of vasculature suspected. 3. New consolidation left base may represent infiltrate, atelectasis and/or left-sided pleural effusion. 4.  Aortic Atherosclerosis (ICD10-I70.0). Electronically Signed   By: Lacy Duverney M.D.   On: 03/30/2018 07:52   Dg Chest Port 1 View  Result Date: 03/29/2018 CLINICAL DATA:  Shortness of breath. EXAM: PORTABLE CHEST 1 VIEW COMPARISON:  03/28/2018 FINDINGS: Normal heart size. Aortic atherosclerosis. Bilateral upper lobe interstitial opacities appears similar to previous exam. No  new findings. IMPRESSION: 1. Persistent bilateral upper lobe interstitial opacities. Unchanged. Electronically Signed   By: Signa Kell M.D.   On: 03/29/2018 09:09   Dg Chest Port 1 View  Result Date: 03/28/2018 CLINICAL DATA:  Pneumonia. EXAM: PORTABLE CHEST 1 VIEW COMPARISON:  03/27/2018 FINDINGS: Stable cardiac enlargement. Partial clearing of bilateral upper lobe infiltrates with residual airspace disease remaining consistent with pneumonia. Pattern does suggest the potential for aspiration pneumonia. Bibasilar atelectasis versus infiltrates are relatively stable. There may be small bilateral pleural effusions. IMPRESSION: Improvement in bilateral upper lobe infiltrates. Pattern suggest potential for aspiration pneumonia. Bibasilar atelectasis versus infiltrates are relatively stable with potential small bilateral pleural effusions. Electronically Signed   By: Irish Lack M.D.   On: 03/28/2018 08:25   Dg Chest Port 1 View  Result Date: 03/27/2018 CLINICAL DATA:  Fever, chills, productive cough, shortness of breath EXAM: PORTABLE CHEST 1 VIEW COMPARISON:  03/26/2018 FINDINGS: Bilateral upper lobe opacities, right greater than left, suspicious for pneumonia. Possible left basilar  opacity. No definite pleural effusions. No pneumothorax. The heart is top-normal in size.  Thoracic aortic atherosclerosis. IMPRESSION: Bilateral upper lobe opacities, right greater than left, suspicious for pneumonia. Electronically Signed   By: Charline Bills M.D.   On: 03/27/2018 08:17   Dg Chest Port 1 View  Result Date: 03/26/2018 CLINICAL DATA:  Shortness of breath. EXAM: PORTABLE CHEST 1 VIEW COMPARISON:  Radiograph September 23, 2018. FINDINGS: Stable cardiomegaly. Atherosclerosis of thoracic aorta is noted. No pneumothorax is noted. Stable faint opacity seen in right upper lobe which may represent developing pneumonia. Minimal left basilar subsegmental atelectasis may be present. Bony thorax is unremarkable. IMPRESSION: Stable faint right upper lobe opacity as described above. Aortic Atherosclerosis (ICD10-I70.0). Electronically Signed   By: Lupita Raider, M.D.   On: 03/26/2018 08:04   Dg Chest Port 1 View  Result Date: 03/25/2018 CLINICAL DATA:  Shortness of breath. EXAM: PORTABLE CHEST 1 VIEW COMPARISON:  Radiographs of March 23, 2018. FINDINGS: Stable cardiomegaly. Atherosclerosis of thoracic aorta is noted. No pneumothorax or pleural effusion is noted. Left lung is clear. Mildly increased right upper lobe airspace opacity is noted concerning for developing pneumonia. Bony thorax is unremarkable. IMPRESSION: Mildly increased right upper lobe airspace opacity is noted concerning for developing pneumonia. Aortic Atherosclerosis (ICD10-I70.0). Electronically Signed   By: Lupita Raider, M.D.   On: 03/25/2018 10:02   Dg Abd Portable 1v  Result Date: 03/25/2018 CLINICAL DATA:  Volvulus. EXAM: PORTABLE ABDOMEN - 1 VIEW COMPARISON:  CT scan of March 23, 2018. Radiographs of July 11, 2017. FINDINGS: The bowel gas pattern is normal. Status post cholecystectomy. Phleboliths are noted in the pelvis. IMPRESSION: No evidence of bowel obstruction or ileus. Electronically Signed   By: Lupita Raider,  M.D.   On: 03/25/2018 10:04   Dg Swallowing Func-speech Pathology  Result Date: 03/30/2018 Objective Swallowing Evaluation: Type of Study: MBS-Modified Barium Swallow Study  Patient Details Name: Jordan Horne MRN: [id number] Date of Birth: 09/20/1932 Today's Date: 03/30/2018 Time: SLP Start Time (ACUTE ONLY): 1157 -SLP Stop Time (ACUTE ONLY): 1225 SLP Time Calculation (min) (ACUTE ONLY): 28 min Past Medical History: Past Medical History: Diagnosis Date . Glaucoma  . Hypertension  . Thyroid disease  Past Surgical History: Past Surgical History: Procedure Laterality Date . ABDOMINAL HYSTERECTOMY   . 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 Pt had SBO now respolved.  Pt has clinical presentation c/w pneumonia.  CXR 1/2 "Stable faint opacity seen in right upper lobe which may represent developing pneumonia."  Subjective: Pt awake, alert, pleasant, participatory Assessment / Plan / Recommendation CHL IP CLINICAL IMPRESSIONS 03/30/2018 Clinical Impression Pt presents with a mild pharyngeal dysphagia with intermittent, trace aspiration of thin liquids observed prior to swallow onset.  Thin liquids passed into pyriforms and spilled into the airway (both anteriorly and posteriorly) before laryngeal vestibule closure.  A cued cough was not effective in removing aspirate.  Interestingly, the first compensatory strategy attempted - a head turn toward the left shoulder during the swallow- was consistently effective in sealing off the larynx and preventing penetration/aspiration.  There was inconsistent vallecular residue post-swallow.  Age-related changes in vertebrae were present but did not appear to impact function. Recommend continuing current diet (regular/thins);  head turn to left when swallowing liquids.  Etiology of deficits not clear.  SLP will follow acutely for education/safety.  SLP Visit Diagnosis Dysphagia, pharyngeal phase (R13.13) Attention and concentration deficit following -- Frontal lobe and executive function deficit following -- Impact on safety and function Mild aspiration risk   CHL IP TREATMENT RECOMMENDATION 03/30/2018 Treatment Recommendations Therapy as outlined in treatment plan below   No flowsheet data found. CHL IP DIET RECOMMENDATION 03/30/2018 SLP Diet Recommendations Regular solids;Thin liquid Liquid Administration via Cup;Straw Medication Administration Crushed with puree Compensations Other (Comment) Postural Changes --   CHL IP OTHER RECOMMENDATIONS 03/30/2018 Recommended Consults -- Oral Care Recommendations Oral care BID Other Recommendations --   CHL IP FOLLOW UP RECOMMENDATIONS 03/30/2018 Follow up Recommendations None   CHL IP FREQUENCY AND DURATION 03/30/2018 Speech Therapy Frequency (ACUTE ONLY) min 1 x/week Treatment Duration 1 week      CHL IP ORAL PHASE 03/30/2018 Oral Phase WFL Oral - Pudding Teaspoon -- Oral - Pudding Cup -- Oral - Honey Teaspoon -- Oral - Honey Cup -- Oral - Nectar Teaspoon -- Oral - Nectar Cup -- Oral - Nectar Straw -- Oral - Thin Teaspoon -- Oral - Thin Cup -- Oral - Thin Straw -- Oral - Puree -- Oral - Mech Soft -- Oral - Regular -- Oral - Multi-Consistency -- Oral - Pill -- Oral Phase - Comment --  CHL IP PHARYNGEAL PHASE 03/30/2018 Pharyngeal Phase Impaired Pharyngeal- Pudding Teaspoon -- Pharyngeal -- Pharyngeal- Pudding Cup -- Pharyngeal -- Pharyngeal- Honey Teaspoon -- Pharyngeal -- Pharyngeal- Honey Cup -- Pharyngeal -- Pharyngeal- Nectar Teaspoon -- Pharyngeal -- Pharyngeal- Nectar Cup -- Pharyngeal -- Pharyngeal- Nectar Straw -- Pharyngeal -- Pharyngeal- Thin Teaspoon -- Pharyngeal -- Pharyngeal- Thin Cup Delayed swallow initiation-pyriform sinuses;Reduced airway/laryngeal closure;Penetration/Aspiration before  swallow;Trace aspiration Pharyngeal Material enters airway, passes BELOW cords without attempt by patient to eject out (silent aspiration) Pharyngeal- Thin Straw Delayed swallow initiation-pyriform sinuses;Penetration/Aspiration before swallow;Reduced airway/laryngeal closure;Trace aspiration Pharyngeal Material enters airway, passes BELOW cords without attempt by patient to eject out (silent aspiration) Pharyngeal- Puree Delayed swallow initiation-vallecula Pharyngeal -- Pharyngeal- Mechanical Soft -- Pharyngeal -- Pharyngeal- Regular Delayed swallow initiation-vallecula;Pharyngeal residue - valleculae Pharyngeal -- Pharyngeal- Multi-consistency -- Pharyngeal -- Pharyngeal- Pill -- Pharyngeal -- Pharyngeal Comment --  No flowsheet data found. Blenda Mounts Laurice 03/30/2018, 1:41 PM              US Abdomen Limited Ruq  Result Date: 03/25/2018 CLINICAL DATA:  85 year old with elevated liver function tests. Surgical history includes cholecystectomy. EXAM:  ULTRASOUND ABDOMEN LIMITED RIGHT UPPER QUADRANT COMPARISON:  No prior ultrasound. CT abdomen and pelvis 03/23/2018, 07/01/2017. FINDINGS: Gallbladder: Surgically absent. Common bile duct: Diameter: Approximately 3 mm. Liver: Normal size and echotexture without focal parenchymal abnormality. Portal vein is patent on color Doppler imaging with normal direction of blood flow towards the liver. IMPRESSION: Normal examination post cholecystectomy. Electronically Signed   By: Hulan Saas M.D.   On: 03/25/2018 20:22   ECHOCARDIOGRAM ------------------------------------------------------------------- Study Conclusions  - Left ventricle: The cavity size was normal. Wall thickness was   increased in a pattern of mild LVH. Systolic function was normal.   The estimated ejection fraction was in the range of 55% to 60%.   Wall motion was normal; there were no regional wall motion   abnormalities. Features are consistent with a pseudonormal left   ventricular  filling pattern, with concomitant abnormal relaxation   and increased filling pressure (grade 2 diastolic dysfunction).   Doppler parameters are consistent with high ventricular filling   pressure. - Aortic valve: There was mild regurgitation.  Subjective: Seen and examined at bedside was doing better.  Denied any chest pain, lightheadedness or dizziness.  No nausea or vomiting.  Thinks that her shortness of breath is better.  No other concerns or complaints at this time  Discharge Exam: Vitals:   03/31/18 0816 03/31/18 1403  BP:  (!) 141/78  Pulse:  91  Resp:  18  Temp:  98 F (36.7 C)  SpO2: 93% 92%   Vitals:   03/31/18 0500 03/31/18 0815 03/31/18 0816 03/31/18 1403  BP: (!) 152/78   (!) 141/78  Pulse: 85   91  Resp: 16   18  Temp: 97.9 F (36.6 C)   98 F (36.7 C)  TempSrc: Oral   Oral  SpO2: 94% 93% 93% 92%  Weight: 65.8 kg     Height:       General: Pt is alert, awake, not in acute distress Cardiovascular: RRR, S1/S2 +, no rubs, no gallops Respiratory: Diminished bilaterally, no wheezing, no rhonchi and improved crackles Abdominal: Soft, NT, ND, bowel sounds + Extremities: Very mild LE edema, no cyanosis  The results of significant diagnostics from this hospitalization (including imaging, microbiology, ancillary and laboratory) are listed below for reference.    Microbiology: Recent Results (from the past 240 hour(s))  Blood culture (routine x 2)     Status: None   Collection Time: 03/23/18 10:15 PM  Result Value Ref Range Status   Specimen Description BLOOD LEFT ARM  Final   Special Requests   Final    BOTTLES DRAWN AEROBIC AND ANAEROBIC Blood Culture adequate volume Performed at [facility], [address], [place], [place] [address]    Culture NO GROWTH 5 DAYS  Final   Report Status 03/29/2018 FINAL  Final  Blood culture (routine x 2)     Status: None   Collection Time: 03/24/18 12:32 AM  Result Value Ref Range Status   Specimen Description BLOOD  RIGHT HAND  Final   Special Requests   Final    BOTTLES DRAWN AEROBIC ONLY Blood Culture results may not be optimal due to an excessive volume of blood received in culture bottles Performed at [facility], [address], [place], [place] [address]    Culture NO GROWTH 5 DAYS  Final   Report Status 03/29/2018 FINAL  Final  Respiratory Panel by PCR     Status: Abnormal   Collection Time: 03/24/18 10:28 AM  Result Value Ref Range Status   Adenovirus NOT DETECTED NOT DETECTED Final   Coronavirus 229E NOT DETECTED NOT DETECTED Final   Coronavirus HKU1 NOT DETECTED NOT DETECTED Final   Coronavirus NL63 NOT DETECTED NOT DETECTED Final   Coronavirus OC43 NOT DETECTED NOT DETECTED Final   Metapneumovirus NOT DETECTED NOT DETECTED Final   Rhinovirus / Enterovirus NOT DETECTED NOT DETECTED Final   Influenza A NOT DETECTED NOT DETECTED Final   Influenza B NOT DETECTED NOT DETECTED Final   Parainfluenza Virus 1 NOT DETECTED NOT DETECTED Final   Parainfluenza Virus 2 NOT DETECTED NOT DETECTED Final   Parainfluenza Virus 3 NOT DETECTED NOT DETECTED Final   Parainfluenza Virus 4 NOT DETECTED NOT DETECTED Final   Respiratory Syncytial Virus DETECTED (A) NOT DETECTED Final    Comment: CRITICAL RESULT CALLED TO, READ BACK BY AND VERIFIED WITH: K HARTGROVE RN 03/24/18 2111 JDW    Bordetella pertussis NOT DETECTED NOT DETECTED Final   Chlamydophila pneumoniae NOT DETECTED NOT DETECTED Final   Mycoplasma pneumoniae NOT DETECTED NOT DETECTED Final    Comment: Performed at [facility], [address] [place], [place] [address]  Culture, sputum-assessment     Status: None   Collection Time: 03/24/18  9:47 PM  Result Value Ref Range Status   Specimen Description EXPECTORATED SPUTUM  Final   Special Requests NONE  Final   Sputum evaluation   Final    THIS SPECIMEN IS ACCEPTABLE FOR SPUTUM CULTURE Performed at [facility], [address], [place], [place] [address]    Report Status  03/25/2018 FINAL  Final  Culture, respiratory     Status: None   Collection Time: 03/24/18  9:47 PM  Result Value Ref Range Status   Specimen Description EXPECTORATED SPUTUM  Final   Special Requests NONE Reflexed from [id number]  Final   Gram Stain   Final    ABUNDANT WBC PRESENT,BOTH PMN AND MONONUCLEAR MODERATE GRAM POSITIVE COCCI MODERATE GRAM VARIABLE ROD    Culture   Final    FEW Consistent with normal respiratory flora. Performed at [facility], [address] [place], [place] [address]    Report Status 03/27/2018 FINAL  Final  Culture, Urine     Status: None   Collection Time: 03/25/18  6:04 PM  Result Value Ref Range Status   Specimen Description URINE, RANDOM  Final   Special Requests NONE  Final   Culture   Final    NO GROWTH Performed at [facility], [address] [place], [place] [address]    Report Status 03/26/2018 FINAL  Final    Labs: BNP (last 3 results) Recent Labs    03/30/18 0615  BNP 776.5*   Basic Metabolic Panel: Recent Labs  Lab 03/27/18 0207 03/28/18 0246 03/29/18 0519 03/30/18 0221 03/31/18 0445  NA 143 142 141 139 138  K 3.8 3.9 3.3* 3.7 3.7  CL 109 106 105 107 103  CO2 25 29 27 24 29   GLUCOSE 163* 153* 148* 148* 140*  BUN 10 11 14 15 20   CREATININE 0.72 0.76 0.71 0.75 0.80  CALCIUM 8.4* 8.2* 8.0* 8.4* 8.2*  MG 2.0 2.1 2.0 2.0 2.1  PHOS 2.0* 2.3* 2.5 2.7 3.4   Liver Function Tests: Recent Labs  Lab 03/27/18 0207 03/28/18 0246 03/29/18 0519 03/30/18 0221 03/31/18 0445  AST 24 23 25 24 20   ALT 34 28 30 29 30   ALKPHOS 53 48 40 40 37*  BILITOT 0.6 0.7 0.7 1.0 1.0  PROT 6.5 5.9* 5.6* 5.6* 5.1*  ALBUMIN 2.8* 2.6* 2.5* 2.5* 2.4*   No results for input(s): LIPASE, AMYLASE in the last 168 hours. No results for input(s): AMMONIA in the last 168 hours. CBC: Recent Labs  Lab 03/27/18 0207 03/28/18 0246 03/29/18 0519 03/30/18 0615 03/31/18 0445  WBC 18.9* 13.1* 11.3* 13.4* 12.0*  NEUTROABS 16.9* 10.9* 9.1* 11.8*  10.9*  HGB 12.5 11.8* 12.3 12.3 12.6  HCT 38.4 36.5 37.9 37.4 39.0  MCV 94.1 94.3 93.8 94.0 93.8  PLT 198 206 221 242 254   Cardiac Enzymes: No results for input(s): CKTOTAL, CKMB, CKMBINDEX, TROPONINI in the last 168 hours. BNP: Invalid input(s): POCBNP CBG: No results for input(s): GLUCAP in the last 168 hours. D-Dimer No results for input(s): DDIMER in the last 72 hours. Hgb A1c No results for input(s): HGBA1C in the last 72 hours. Lipid Profile No results for input(s): CHOL, HDL, LDLCALC, TRIG, CHOLHDL, LDLDIRECT in the last 72 hours. Thyroid function studies No results for input(s): TSH, T4TOTAL, T3FREE, THYROIDAB in the last 72 hours.  Invalid input(s): FREET3 Anemia work up No results for input(s): VITAMINB12, FOLATE, FERRITIN, TIBC, IRON, RETICCTPCT in the last 72 hours. Urinalysis    Component Value Date/Time   COLORURINE YELLOW 03/25/2018 1812   APPEARANCEUR CLEAR 03/25/2018 1812   LABSPEC 1.015 03/25/2018 1812   PHURINE 5.0 03/25/2018 1812   GLUCOSEU NEGATIVE 03/25/2018 1812   HGBUR SMALL (A) 03/25/2018 1812   BILIRUBINUR NEGATIVE 03/25/2018 1812   KETONESUR 20 (A) 03/25/2018 1812   PROTEINUR 30 (A) 03/25/2018 1812   UROBILINOGEN 0.2 12/09/2009 0006   NITRITE NEGATIVE 03/25/2018 1812   LEUKOCYTESUR TRACE (A) 03/25/2018 1812   Sepsis Labs Invalid input(s): PROCALCITONIN,  WBC,  LACTICIDVEN Microbiology Recent Results (from the past 240 hour(s))  Blood culture (routine x 2)     Status: None   Collection Time: 03/23/18 10:15 PM  Result Value Ref Range Status   Specimen Description BLOOD LEFT ARM  Final   Special Requests   Final    BOTTLES DRAWN AEROBIC AND ANAEROBIC Blood Culture adequate volume Performed at [facility], [address] [place], [place] [address]    Culture NO GROWTH 5 DAYS  Final   Report Status 03/29/2018 FINAL  Final  Blood culture (routine x 2)     Status: None   Collection Time: 03/24/18 12:32 AM  Result Value Ref Range  Status   Specimen Description BLOOD RIGHT HAND  Final   Special Requests   Final    BOTTLES DRAWN AEROBIC ONLY Blood Culture results may not be optimal due to an excessive volume of blood received in culture bottles Performed at [facility], [address] [place], [place] [address]    Culture NO GROWTH 5 DAYS  Final   Report Status 03/29/2018 FINAL  Final  Respiratory Panel by PCR     Status: Abnormal   Collection Time: 03/24/18 10:28 AM  Result Value Ref Range Status   Adenovirus NOT DETECTED NOT DETECTED Final   Coronavirus 229E NOT DETECTED NOT DETECTED Final   Coronavirus HKU1 NOT DETECTED NOT DETECTED Final   Coronavirus NL63 NOT DETECTED NOT DETECTED Final   Coronavirus OC43 NOT DETECTED NOT DETECTED Final   Metapneumovirus NOT DETECTED NOT DETECTED Final   Rhinovirus / Enterovirus NOT DETECTED NOT DETECTED Final   Influenza A NOT DETECTED NOT DETECTED Final   Influenza B NOT DETECTED NOT DETECTED Final  Parainfluenza Virus 1 NOT DETECTED NOT DETECTED Final   Parainfluenza Virus 2 NOT DETECTED NOT DETECTED Final   Parainfluenza Virus 3 NOT DETECTED NOT DETECTED Final   Parainfluenza Virus 4 NOT DETECTED NOT DETECTED Final   Respiratory Syncytial Virus DETECTED (A) NOT DETECTED Final    Comment: CRITICAL RESULT CALLED TO, READ BACK BY AND VERIFIED WITH: K HARTGROVE RN 03/24/18 2111 JDW    Bordetella pertussis NOT DETECTED NOT DETECTED Final   Chlamydophila pneumoniae NOT DETECTED NOT DETECTED Final   Mycoplasma pneumoniae NOT DETECTED NOT DETECTED Final    Comment: Performed at [facility], [address] [place], [place] [address]  Culture, sputum-assessment     Status: None   Collection Time: 03/24/18  9:47 PM  Result Value Ref Range Status   Specimen Description EXPECTORATED SPUTUM  Final   Special Requests NONE  Final   Sputum evaluation   Final    THIS SPECIMEN IS ACCEPTABLE FOR SPUTUM CULTURE Performed at [facility], [address]  [place], [place] [address]    Report Status 03/25/2018 FINAL  Final  Culture, respiratory     Status: None   Collection Time: 03/24/18  9:47 PM  Result Value Ref Range Status   Specimen Description EXPECTORATED SPUTUM  Final   Special Requests NONE Reflexed from [id number]  Final   Gram Stain   Final    ABUNDANT WBC PRESENT,BOTH PMN AND MONONUCLEAR MODERATE GRAM POSITIVE COCCI MODERATE GRAM VARIABLE ROD    Culture   Final    FEW Consistent with normal respiratory flora. Performed at [facility], [address], [place], [place] [address]    Report Status 03/27/2018 FINAL  Final  Culture, Urine     Status: None   Collection Time: 03/25/18  6:04 PM  Result Value Ref Range Status   Specimen Description URINE, RANDOM  Final   Special Requests NONE  Final   Culture   Final    NO GROWTH Performed at [facility], [address], [place], [place] [address]    Report Status 03/26/2018 FINAL  Final   Time coordinating discharge: 35 minutes  SIGNED:  Merlene Laughter, DO [facility] 03/31/2018, 3:34 PM Pager is on AMION  If 7PM-7AM, please contact night-coverage [email or web address] Password [email or web address]

## 2018-04-14 ENCOUNTER — Other Ambulatory Visit (HOSPITAL_COMMUNITY): Payer: Self-pay | Admitting: Internal Medicine

## 2018-05-07 ENCOUNTER — Encounter: Payer: Self-pay | Admitting: Cardiology

## 2018-05-07 ENCOUNTER — Ambulatory Visit (INDEPENDENT_AMBULATORY_CARE_PROVIDER_SITE_OTHER): Payer: Medicare Other | Admitting: Cardiology

## 2018-05-07 VITALS — BP 112/63 | HR 95 | Ht 63.0 in | Wt 155.0 lb

## 2018-05-07 DIAGNOSIS — E7800 Pure hypercholesterolemia, unspecified: Secondary | ICD-10-CM | POA: Diagnosis not present

## 2018-05-07 DIAGNOSIS — R079 Chest pain, unspecified: Secondary | ICD-10-CM

## 2018-05-07 DIAGNOSIS — R9431 Abnormal electrocardiogram [ECG] [EKG]: Secondary | ICD-10-CM | POA: Insufficient documentation

## 2018-05-07 DIAGNOSIS — R0789 Other chest pain: Secondary | ICD-10-CM | POA: Diagnosis not present

## 2018-05-07 HISTORY — DX: Abnormal electrocardiogram (ECG) (EKG): R94.31

## 2018-05-07 NOTE — Progress Notes (Signed)
Cardiology Office Note:    Date:  05/07/2018   ID:  Jordan Horne, DOB 20-Feb-1933, MRN [id number]  PCP:  Dema Severin, NP  Cardiologist:  Garwin Brothers, MD   Referring MD: Dema Severin, NP    ASSESSMENT:    1. Chest pain of uncertain etiology   2. Hypercholesterolemia   3. Abnormal electrocardiogram (ECG) (EKG)    PLAN:    In order of problems listed above:  1. Primary prevention stressed with the patient.  Importance of compliance with diet and medication stressed and she vocalized understanding.  Her blood pressure is stable.  Diet was discussed for dyslipidemia and this issue is managed by her primary care physician. 2. Her EKG is abnormal but her symptoms are atypical.  She has substernal chest burning at times and this is not related to exertion and lasts for a very brief moment of time.  There is no radiation of the symptoms however he we have multiple risk factors we will do a Lexiscan sestamibi.  She knows to go to the nearest emergency room if she has any significant concerns 3. Patient will be seen in follow-up appointment in 6 months or earlier if the patient has any concerns    Medication Adjustments/Labs and Tests Ordered: Current medicines are reviewed at length with the patient today.  Concerns regarding medicines are outlined above.  No orders of the defined types were placed in this encounter.  No orders of the defined types were placed in this encounter.    History of Present Illness:    Jordan Horne is a 85 y.o. female who is being seen today for the evaluation of chest discomfort at the request of York, Regina F, NP.  Patient is a pleasant 85 year old female.  She has past medical history of dyslipidemia.  She denies any history of hypertension or diabetes mellitus.  She mentions to me that she was not [facility] and that she was treated for community-acquired pneumonia.  I reviewed the records.  Echocardiogram revealed preserved  systolic function.  She occasionally has chest discomfort like a burning sensation and this is not related to exertion.  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  At the time of my evaluation, the patient is alert awake oriented and in no distress.  Past Medical History:  Diagnosis Date  . Glaucoma   . Hypertension   . Thyroid disease     Past Surgical History:  Procedure Laterality Date  . ABDOMINAL HYSTERECTOMY    . THYROID CYST EXCISION      Current Medications: Current Meds  Medication Sig  . diazepam (VALIUM) 5 MG tablet Take 1 tablet (5 mg total) by mouth every 6 (six) hours as needed for anxiety.  . divalproex (DEPAKOTE) 125 MG DR tablet Take 125 mg by mouth daily.  . furosemide (LASIX) 20 MG tablet Take 1 tablet (20 mg total) by mouth daily.  Marland Kitchen guaiFENesin (MUCINEX) 600 MG 12 hr tablet Take 1 tablet (600 mg total) by mouth 2 (two) times daily.  Marland Kitchen ipratropium-albuterol (DUONEB) 0.5-2.5 (3) MG/3ML SOLN Take 3 mLs by nebulization every 6 (six) hours as needed.  . latanoprost (XALATAN) 0.005 % ophthalmic solution Place 1 drop into the left eye at bedtime.   . ondansetron (ZOFRAN) 4 MG tablet Take 1 tablet (4 mg total) by mouth every 6 (six)  hours as needed for nausea.  . pantoprazole (PROTONIX) 40 MG tablet Take 1 tablet (40 mg total) by mouth daily.  . predniSONE (STERAPRED UNI-PAK 21 TAB) 10 MG (21) TBPK tablet Take 6 pills on day 1, 5 pills on day 2, 4 pills on day 3, 3 pills on day 4, 2 pills on day 5, 1 pill on day 6 and stop on day 7  . simvastatin (ZOCOR) 20 MG tablet Take 20 mg by mouth daily.   Marland Kitchen. SYNTHROID 88 MCG tablet Take 88 mcg by mouth daily before breakfast.      Allergies:   Betadine [povidone iodine]   Social History   Socioeconomic History  . Marital status: Married    Spouse name: Not on file  . Number of children:  Not on file  . Years of education: Not on file  . Highest education level: Not on file  Occupational History  . Not on file  Social Needs  . Financial resource strain: Not on file  . Food insecurity:    Worry: Not on file    Inability: Not on file  . Transportation needs:    Medical: Not on file    Non-medical: Not on file  Tobacco Use  . Smoking status: Never Smoker  . Smokeless tobacco: Never Used  Substance and Sexual Activity  . Alcohol use: Yes    Alcohol/week: 1.0 standard drinks    Types: 1 Glasses of wine per week    Comment: just occasional  . Drug use: No  . Sexual activity: Not on file  Lifestyle  . Physical activity:    Days per week: Not on file    Minutes per session: Not on file  . Stress: Not on file  Relationships  . Social connections:    Talks on phone: Not on file    Gets together: Not on file    Attends religious service: Not on file    Active member of club or organization: Not on file    Attends meetings of clubs or organizations: Not on file    Relationship status: Not on file  Other Topics Concern  . Not on file  Social History Narrative  . Not on file     Family History: The patient's family history includes Arthritis in her mother; Cancer in her brother; Heart attack in her father; Heart failure in her mother.  ROS:   Please see the history of present illness.    All other systems reviewed and are negative.  EKGs/Labs/Other Studies Reviewed:    The following studies were reviewed today: I reviewed records and echocardiogram and EKG findings.  Discussed at length.  Echocardiogram was done and unremarkable.   Recent Labs: 03/30/2018: B Natriuretic Peptide 776.5 03/31/2018: ALT 30; BUN 20; Creatinine, Ser 0.80; Hemoglobin 12.6; Magnesium 2.1; Platelets 254; Potassium 3.7; Sodium 138  Recent Lipid Panel No results found for: CHOL, TRIG, HDL, CHOLHDL, VLDL, LDLCALC, LDLDIRECT  Physical Exam:    VS:  BP 112/63 (BP Location: Right Arm,  Patient Position: Sitting, Cuff Size: Normal)   Pulse 95   Ht 5\' 3"  (1.6 m)   Wt 155 lb (70.3 kg)   SpO2 97%   BMI 27.46 kg/m     Wt Readings from Last 3 Encounters:  05/07/18 155 lb (70.3 kg)  03/31/18 145 lb (65.8 kg)  10/06/13 158 lb (71.7 kg)     GEN: Patient is in no acute distress HEENT: Normal NECK: No JVD; No carotid bruits LYMPHATICS:  No lymphadenopathy CARDIAC: S1 S2 regular, 2/6 systolic murmur at the apex. RESPIRATORY:  Clear to auscultation without rales, wheezing or rhonchi  ABDOMEN: Soft, non-tender, non-distended MUSCULOSKELETAL:  No edema; No deformity  SKIN: Warm and dry NEUROLOGIC:  Alert and oriented x 3 PSYCHIATRIC:  Normal affect    Signed, Garwin Brothers, MD  05/07/2018 10:26 AM    [facility]

## 2018-05-07 NOTE — Patient Instructions (Signed)
Medication Instructions:   Your physician recommends that you continue on your current medications as directed. Please refer to the Current Medication list given to you today.  If you need a refill on your cardiac medications before your next appointment, please call your pharmacy.   Lab work:  NONE  If you have labs (blood work) drawn today and your tests are completely normal, you will receive your results only by: Marland Kitchen MyChart Message (if you have MyChart) OR . A paper copy in the mail If you have any lab test that is abnormal or we need to change your treatment, we will call you to review the results.  Testing/Procedures:  Your physician has requested that you have a lexiscan myoview. For further information please visit [email or web address] Please follow instruction sheet, as given.   Cardiac Nuclear Scan A cardiac nuclear scan is a test that measures blood flow to the heart when a person is resting and when he or she is exercising. The test looks for problems such as:  Not enough blood reaching a portion of the heart.  The heart muscle not working normally. You may need this test if:  You have heart disease.  You have had abnormal lab results.  You have had heart surgery or a balloon procedure to open up blocked arteries (angioplasty).  You have chest pain.  You have shortness of breath. In this test, a radioactive dye (tracer) is injected into your bloodstream. After the tracer has traveled to your heart, an imaging device is used to measure how much of the tracer is absorbed by or distributed to various areas of your heart. This procedure is usually done at a hospital and takes 2-4 hours. Tell a health care provider about:  Any allergies you have.  All medicines you are taking, including vitamins, herbs, eye drops, creams, and over-the-counter medicines.  Any problems you or family members have had with anesthetic medicines.  Any blood disorders you have.  Any  surgeries you have had.  Any medical conditions you have.  Whether you are pregnant or may be pregnant. What are the risks? Generally, this is a safe procedure. However, problems may occur, including:  Serious chest pain and heart attack. This is only a risk if the stress portion of the test is done.  Rapid heartbeat.  Sensation of warmth in your chest. This usually passes quickly.  Allergic reaction to the tracer. What happens before the procedure?  Ask your health care provider about changing or stopping your regular medicines. This is especially important if you are taking diabetes medicines or blood thinners.  Follow instructions from your health care provider about eating or drinking restrictions.  Remove your jewelry on the day of the procedure. What happens during the procedure?  An IV will be inserted into one of your veins.  Your health care provider will inject a small amount of radioactive tracer through the IV.  You will wait for 20-40 minutes while the tracer travels through your bloodstream.  Your heart activity will be monitored with an electrocardiogram (ECG).  You will lie down on an exam table.  Images of your heart will be taken for about 15-20 minutes.  You may also have a stress test. For this test, one of the following may be done: ? You will exercise on a treadmill or stationary bike. While you exercise, your heart's activity will be monitored with an ECG, and your blood pressure will be checked. ? You will be given medicines that  will increase blood flow to parts of your heart. This is done if you are unable to exercise.  When blood flow to your heart has peaked, a tracer will again be injected through the IV.  After 20-40 minutes, you will get back on the exam table and have more images taken of your heart.  Depending on the type of tracer used, scans may need to be repeated 3-4 hours later.  Your IV line will be removed when the procedure is  over. The procedure may vary among health care providers and hospitals. What happens after the procedure?  Unless your health care provider tells you otherwise, you may return to your normal schedule, including diet, activities, and medicines.  Unless your health care provider tells you otherwise, you may increase your fluid intake. This will help to flush the contrast dye from your body. Drink enough fluid to keep your urine pale yellow.  Ask your health care provider, or the department that is doing the test: ? When will my results be ready? ? How will I get my results? Summary  A cardiac nuclear scan measures the blood flow to the heart when a person is resting and when he or she is exercising.  Tell your health care provider if you are pregnant.  Before the procedure, ask your health care provider about changing or stopping your regular medicines. This is especially important if you are taking diabetes medicines or blood thinners.  After the procedure, unless your health care provider tells you otherwise, increase your fluid intake. This will help flush the contrast dye from your body.  After the procedure, unless your health care provider tells you otherwise, you may return to your normal schedule, including diet, activities, and medicines. This information is not intended to replace advice given to you by your health care provider. Make sure you discuss any questions you have with your health care provider. Document Released: 04/05/2004 Document Revised: 08/25/2017 Document Reviewed: 08/25/2017 Elsevier Interactive Patient Education  2019 [employer].   Follow-Up: At [facility], you and your health needs are our priority.  As part of our continuing mission to provide you with exceptional heart care, we have created designated Provider Care Teams.  These Care Teams include your primary Cardiologist (physician) and Advanced Practice Providers (APPs -  Physician Assistants and  Nurse Practitioners) who all work together to provide you with the care you need, when you need it.   You will need a follow up appointment in 6 months.  Please call our office 2 months in advance to schedule this appointment.

## 2018-11-09 ENCOUNTER — Ambulatory Visit (INDEPENDENT_AMBULATORY_CARE_PROVIDER_SITE_OTHER): Payer: Medicare Other | Admitting: Cardiology

## 2018-11-09 ENCOUNTER — Other Ambulatory Visit: Payer: Self-pay

## 2018-11-09 ENCOUNTER — Encounter: Payer: Self-pay | Admitting: Cardiology

## 2018-11-09 VITALS — BP 104/64 | HR 79 | Ht 63.0 in | Wt 157.0 lb

## 2018-11-09 DIAGNOSIS — E7800 Pure hypercholesterolemia, unspecified: Secondary | ICD-10-CM

## 2018-11-09 DIAGNOSIS — R9431 Abnormal electrocardiogram [ECG] [EKG]: Secondary | ICD-10-CM

## 2018-11-09 NOTE — Progress Notes (Signed)
Cardiology Office Note:    Date:  11/09/2018   ID:  Jordan Horne, DOB 06-02-1932, MRN [id number]  PCP:  Dema SeverinYork, Regina F, NP  Cardiologist:  Garwin Brothersajan R Revankar, MD   Referring MD: Dema SeverinYork, Regina F, NP    ASSESSMENT:    1. Abnormal electrocardiogram (ECG) (EKG)   2. Hypercholesterolemia    PLAN:    In order of problems listed above:  1. Abnormal EKG: In view of the previous evaluation her Lexiscan sestamibi is pending and we will complete at this time.  Patient leads a fairly sedentary lifestyle. 2. Hypokalemia: We will check a Chem-7 today and see if any replenishing is necessary.  Will send copy to primary care. 3. Mixed dyslipidemia: Lipids followed by primary care physician diet discussed. 4. Patient will be seen in follow-up appointment in 6 months or earlier if the patient has any concerns    Medication Adjustments/Labs and Tests Ordered: Current medicines are reviewed at length with the patient today.  Concerns regarding medicines are outlined above.  No orders of the defined types were placed in this encounter.  No orders of the defined types were placed in this encounter.    Chief Complaint  Patient presents with  . Follow-up     History of Present Illness:    Jordan Horne is a 85 y.o. female.  Patient was evaluated for dyspnea on exertion and abnormal EKG.  Subsequently echocardiogram was fine.  The patient stress test was postponed because of the viral pandemic.  Patient went to the emergency room recently for diarrhea.  This has resolved.  [facility].  Today she is accompanied by her husband.  At the time of my evaluation, the patient is alert awake oriented and in no distress.  Past Medical History:  Diagnosis Date  . Glaucoma   . Hypertension   . Thyroid disease     Past Surgical History:  Procedure Laterality Date  . ABDOMINAL HYSTERECTOMY    . THYROID CYST EXCISION      Current Medications: Current Meds   Medication Sig  . cefdinir (OMNICEF) 300 MG capsule Take 300 mg by mouth 2 (two) times daily.  . diazepam (VALIUM) 5 MG tablet Take 1 tablet (5 mg total) by mouth every 6 (six) hours as needed for anxiety.  . divalproex (DEPAKOTE) 125 MG DR tablet Take 125 mg by mouth daily.  Marland Kitchen. donepezil (ARICEPT) 5 MG tablet TAKE 1 TAB(S) ORALLY ONCE (AT BEDTIME) FOR 30 DAY(S)  . furosemide (LASIX) 20 MG tablet Take 1 tablet (20 mg total) by mouth daily.  Marland Kitchen. guaiFENesin (MUCINEX) 600 MG 12 hr tablet Take 1 tablet (600 mg total) by mouth 2 (two) times daily.  Marland Kitchen. ipratropium-albuterol (DUONEB) 0.5-2.5 (3) MG/3ML SOLN Take 3 mLs by nebulization every 6 (six) hours as needed.  . latanoprost (XALATAN) 0.005 % ophthalmic solution Place 1 drop into the left eye at bedtime.   . ondansetron (ZOFRAN) 4 MG tablet Take 1 tablet (4 mg total) by mouth every 6 (six) hours as needed for nausea.  . pantoprazole (PROTONIX) 40 MG tablet Take 1 tablet (40 mg total) by mouth daily.  Marland Kitchen. SYNTHROID 88 MCG tablet Take 88 mcg by mouth daily before breakfast.      Allergies:   Betadine [povidone iodine]   Social History   Socioeconomic History  . Marital status: Married    Spouse name: Not on file  . Number of children: Not on file  . Years  of education: Not on file  . Highest education level: Not on file  Occupational History  . Not on file  Social Needs  . Financial resource strain: Not on file  . Food insecurity    Worry: Not on file    Inability: Not on file  . Transportation needs    Medical: Not on file    Non-medical: Not on file  Tobacco Use  . Smoking status: Never Smoker  . Smokeless tobacco: Never Used  Substance and Sexual Activity  . Alcohol use: Yes    Alcohol/week: 1.0 standard drinks    Types: 1 Glasses of wine per week    Comment: just occasional  . Drug use: No  . Sexual activity: Not on file  Lifestyle  . Physical activity    Days per week: Not on file    Minutes per session: Not on file  .  Stress: Not on file  Relationships  . Social Herbalist on phone: Not on file    Gets together: Not on file    Attends religious service: Not on file    Active member of club or organization: Not on file    Attends meetings of clubs or organizations: Not on file    Relationship status: Not on file  Other Topics Concern  . Not on file  Social History Narrative  . Not on file     Family History: The patient's family history includes Arthritis in her mother; Cancer in her brother; Heart attack in her father; Heart failure in her mother.  ROS:   Please see the history of present illness.    All other systems reviewed and are negative.  EKGs/Labs/Other Studies Reviewed:    The following studies were reviewed today: EKG reveals sinus rhythm and nonspecific ST-T changes.   Recent Labs: 03/30/2018: B Natriuretic Peptide 776.5 03/31/2018: ALT 30; BUN 20; Creatinine, Ser 0.80; Hemoglobin 12.6; Magnesium 2.1; Platelets 254; Potassium 3.7; Sodium 138  Recent Lipid Panel No results found for: CHOL, TRIG, HDL, CHOLHDL, VLDL, LDLCALC, LDLDIRECT  Physical Exam:    VS:  Ht 5\' 3"  (1.6 m)   Wt 157 lb (71.2 kg)   BMI 27.81 kg/m     Wt Readings from Last 3 Encounters:  11/09/18 157 lb (71.2 kg)  05/07/18 155 lb (70.3 kg)  03/31/18 145 lb (65.8 kg)     GEN: Patient is in no acute distress HEENT: Normal NECK: No JVD; No carotid bruits LYMPHATICS: No lymphadenopathy CARDIAC: Hear sounds regular, 2/6 systolic murmur at the apex. RESPIRATORY:  Clear to auscultation without rales, wheezing or rhonchi  ABDOMEN: Soft, non-tender, non-distended MUSCULOSKELETAL:  No edema; No deformity  SKIN: Warm and dry NEUROLOGIC:  Alert and oriented x 3 PSYCHIATRIC:  Normal affect   Signed, Jenean Lindau, MD  11/09/2018 12:33 PM    [facility]

## 2018-11-09 NOTE — Patient Instructions (Addendum)
Medication Instructions:  Your physician recommends that you continue on your current medications as directed. Please refer to the Current Medication list given to you today.  If you need a refill on your cardiac medications before your next appointment, please call your pharmacy.   Lab work: Your physician recommends that you have a BMP drawn today.   If you have labs (blood work) drawn today and your tests are completely normal, you will receive your results only by: Marland Kitchen. MyChart Message (if you have MyChart) OR . A paper copy in the mail If you have any lab test that is abnormal or we need to change your treatment, we will call you to review the results.  Testing/Procedures: You had an EKG performed today.   Your physician has requested that you have a lexiscan myoview. For further information please visit [email or web address]. Please follow instruction sheet, as given.   Follow-Up: At [facility], you and your health needs are our priority.  As part of our continuing mission to provide you with exceptional heart care, we have created designated Provider Care Teams.  These Care Teams include your primary Cardiologist (physician) and Advanced Practice Providers (APPs -  Physician Assistants and Nurse Practitioners) who all work together to provide you with the care you need, when you need it. You will need a follow up appointment in 6 months.    Any Other Special Instructions Will Be Listed Below Regadenoson injection What is this medicine? REGADENOSON is used to test the heart for coronary artery disease. It is used in patients who can not exercise for their stress test. This medicine may be used for other purposes; ask your health care provider or pharmacist if you have questions. COMMON BRAND NAME(S): Lexiscan What should I tell my health care provider before I take this medicine? They need to know if you have any of these conditions:  heart problems  lung or breathing disease,  like asthma or COPD  an unusual or allergic reaction to regadenoson, other medicines, foods, dyes, or preservatives  pregnant or trying to get pregnant  breast-feeding How should I use this medicine? This medicine is for injection into a vein. It is given by a health care professional in a hospital or clinic setting. Talk to your pediatrician regarding the use of this medicine in children. Special care may be needed. Overdosage: If you think you have taken too much of this medicine contact a poison control center or emergency room at once. NOTE: This medicine is only for you. Do not share this medicine with others. What if I miss a dose? This does not apply. What may interact with this medicine?  caffeine  dipyridamole  guarana  theophylline This list may not describe all possible interactions. Give your health care provider a list of all the medicines, herbs, non-prescription drugs, or dietary supplements you use. Also tell them if you smoke, drink alcohol, or use illegal drugs. Some items may interact with your medicine. What should I watch for while using this medicine? Your condition will be monitored carefully while you are receiving this medicine. Do not take medicines, foods, or drinks with caffeine (like coffee, tea, or colas) for at least 12 hours before your test. If you do not know if something contains caffeine, ask your health care professional. What side effects may I notice from receiving this medicine? Side effects that you should report to your doctor or health care professional as soon as possible:  allergic reactions like skin rash, itching  or hives, swelling of the face, lips, or tongue  breathing problems  chest pain, tightness or palpitations  severe headache Side effects that usually do not require medical attention (report to your doctor or health care professional if they continue or are bothersome):  flushing  headache  irritation or pain at site  where injected  nausea, vomiting This list may not describe all possible side effects. Call your doctor for medical advice about side effects. You may report side effects to FDA at 1-800-FDA-1088. Where should I keep my medicine? This drug is given in a hospital or clinic and will not be stored at home. NOTE: This sheet is a summary. It may not cover all possible information. If you have questions about this medicine, talk to your doctor, pharmacist, or health care provider.  2020 Elsevier/Gold Standard (2007-11-09 15:08:13)  Cardiac Nuclear Scan A cardiac nuclear scan is a test that is done to check the flow of blood to your heart. It is done when you are resting and when you are exercising. The test looks for problems such as:  Not enough blood reaching a portion of the heart.  The heart muscle not working as it should. You may need this test if:  You have heart disease.  You have had lab results that are not normal.  You have had heart surgery or a balloon procedure to open up blocked arteries (angioplasty).  You have chest pain.  You have shortness of breath. In this test, a special dye (tracer) is put into your bloodstream. The tracer will travel to your heart. A camera will then take pictures of your heart to see how the tracer moves through your heart. This test is usually done at a hospital and takes 2-4 hours. Tell a doctor about:  Any allergies you have.  All medicines you are taking, including vitamins, herbs, eye drops, creams, and over-the-counter medicines.  Any problems you or family members have had with anesthetic medicines.  Any blood disorders you have.  Any surgeries you have had.  Any medical conditions you have.  Whether you are pregnant or may be pregnant. What are the risks? Generally, this is a safe test. However, problems may occur, such as:  Serious chest pain and heart attack. This is only a risk if the stress portion of the test is  done.  Rapid heartbeat.  A feeling of warmth in your chest. This feeling usually does not last long.  Allergic reaction to the tracer. What happens before the test?  Ask your doctor about changing or stopping your normal medicines. This is important.  Follow instructions from your doctor about what you cannot eat or drink.  Remove your jewelry on the day of the test. What happens during the test?  An IV tube will be inserted into one of your veins.  Your doctor will give you a small amount of tracer through the IV tube.  You will wait for 20-40 minutes while the tracer moves through your bloodstream.  Your heart will be monitored with an electrocardiogram (ECG).  You will lie down on an exam table.  Pictures of your heart will be taken for about 15-20 minutes.  You may also have a stress test. For this test, one of these things may be done: ? You will be asked to exercise on a treadmill or a stationary bike. ? You will be given medicines that will make your heart work harder. This is done if you are unable to exercise.  When blood flow to your heart has peaked, a tracer will again be given through the IV tube.  After 20-40 minutes, you will get back on the exam table. More pictures will be taken of your heart.  Depending on the tracer that is used, more pictures may need to be taken 3-4 hours later.  Your IV tube will be removed when the test is over. The test may vary among doctors and hospitals. What happens after the test?  Ask your doctor: ? Whether you can return to your normal schedule, including diet, activities, and medicines. ? Whether you should drink more fluids. This will help to remove the tracer from your body. Drink enough fluid to keep your pee (urine) pale yellow.  Ask your doctor, or the department that is doing the test: ? When will my results be ready? ? How will I get my results? Summary  A cardiac nuclear scan is a test that is done to check  the flow of blood to your heart.  Tell your doctor whether you are pregnant or may be pregnant.  Before the test, ask your doctor about changing or stopping your normal medicines. This is important.  Ask your doctor whether you can return to your normal activities. You may be asked to drink more fluids. This information is not intended to replace advice given to you by your health care provider. Make sure you discuss any questions you have with your health care provider. Document Released: 08/25/2017 Document Revised: 07/01/2018 Document Reviewed: 08/25/2017 Elsevier Patient Education  2020 [employer]

## 2018-11-11 LAB — BASIC METABOLIC PANEL
BUN/Creatinine Ratio: 13 (ref 12–28)
BUN: 11 mg/dL (ref 8–27)
CO2: 24 mmol/L (ref 20–29)
Calcium: 9.4 mg/dL (ref 8.7–10.3)
Chloride: 105 mmol/L (ref 96–106)
Creatinine, Ser: 0.85 mg/dL (ref 0.57–1.00)
GFR calc Af Amer: 73 mL/min/{1.73_m2} (ref >59)
GFR calc non Af Amer: 63 mL/min/{1.73_m2} (ref >59)
Glucose: 83 mg/dL (ref 65–99)
Potassium: 4.3 mmol/L (ref 3.5–5.2)
Sodium: 143 mmol/L (ref 134–144)

## 2018-11-25 DIAGNOSIS — R0789 Other chest pain: Secondary | ICD-10-CM

## 2018-11-25 DIAGNOSIS — F0390 Unspecified dementia without behavioral disturbance: Secondary | ICD-10-CM

## 2018-11-25 DIAGNOSIS — R0902 Hypoxemia: Secondary | ICD-10-CM | POA: Diagnosis not present

## 2018-11-25 DIAGNOSIS — I351 Nonrheumatic aortic (valve) insufficiency: Secondary | ICD-10-CM

## 2018-11-25 DIAGNOSIS — E039 Hypothyroidism, unspecified: Secondary | ICD-10-CM

## 2018-11-25 DIAGNOSIS — R410 Disorientation, unspecified: Secondary | ICD-10-CM

## 2018-11-25 DIAGNOSIS — J189 Pneumonia, unspecified organism: Secondary | ICD-10-CM

## 2018-11-25 DIAGNOSIS — K5900 Constipation, unspecified: Secondary | ICD-10-CM

## 2018-11-26 DIAGNOSIS — R0902 Hypoxemia: Secondary | ICD-10-CM | POA: Diagnosis not present

## 2018-11-26 DIAGNOSIS — K5900 Constipation, unspecified: Secondary | ICD-10-CM | POA: Diagnosis not present

## 2018-11-26 DIAGNOSIS — R079 Chest pain, unspecified: Secondary | ICD-10-CM

## 2018-11-26 DIAGNOSIS — J189 Pneumonia, unspecified organism: Secondary | ICD-10-CM | POA: Diagnosis not present

## 2018-11-26 DIAGNOSIS — R410 Disorientation, unspecified: Secondary | ICD-10-CM | POA: Diagnosis not present

## 2019-07-07 ENCOUNTER — Encounter: Payer: Self-pay | Admitting: Cardiology

## 2019-07-07 ENCOUNTER — Ambulatory Visit (INDEPENDENT_AMBULATORY_CARE_PROVIDER_SITE_OTHER): Payer: Medicare Other | Admitting: Cardiology

## 2019-07-07 ENCOUNTER — Other Ambulatory Visit: Payer: Self-pay

## 2019-07-07 VITALS — BP 112/74 | HR 79 | Temp 97.8°F | Ht 63.0 in | Wt 166.4 lb

## 2019-07-07 DIAGNOSIS — E7800 Pure hypercholesterolemia, unspecified: Secondary | ICD-10-CM | POA: Diagnosis not present

## 2019-07-07 DIAGNOSIS — R079 Chest pain, unspecified: Secondary | ICD-10-CM | POA: Diagnosis not present

## 2019-07-07 NOTE — Progress Notes (Signed)
Cardiology Office Note:    Date:  07/07/2019   ID:  Jordan Horne, DOB [birth year], MRN [id number]  PCP:  Jordan Severin, NP  Cardiologist:  Jordan Brothers, MD   Referring MD: Jordan Severin, NP    ASSESSMENT:    1. Hypercholesterolemia   2. Chest pain of uncertain etiology    PLAN:    In order of problems listed above:  1. Primary prevention stressed with the patient.  Importance of compliance with diet and medication stressed and she vocalized understanding. 2. Chest discomfort and abnormal EKG: In view of this I suggested to her that she complete the Lexiscan sestamibi testing so we can get a better idea and rule out the possibility of obstructive coronary artery disease and she is agreeable. 3. Hypothyroidism: Managed by her primary care physician and her TSH is monitored by them. 4. Mixed dyslipidemia: Diet was discussed weight reduction was stressed lipids followed by primary care physician.Patient will be seen in follow-up appointment in 6 months or earlier if the patient has any concerns    Medication Adjustments/Labs and Tests Ordered: Current medicines are reviewed at length with the patient today.  Concerns regarding medicines are outlined above.  No orders of the defined types were placed in this encounter.  No orders of the defined types were placed in this encounter.    No chief complaint on file.    History of Present Illness:    Jordan Horne is a 86 y.o. female.  Patient has a history of hypothyroidism, dyslipidemia and was evaluated for chest discomfort.  She did not complete her Lexiscan sestamibi testing.  She leads a sedentary lifestyle.  She mentions to me that some of her chest discomfort is relieved by taking acid inhibitors.  At the time of my evaluation, the patient is alert awake oriented and in no distress.  Past Medical History:  Diagnosis Date  . Abnormal electrocardiogram (ECG) (EKG) 05/07/2018  . CAP (community acquired pneumonia)  03/24/2018  . Chest pain of uncertain etiology 09/29/2013  . Glaucoma   . Hypercholesterolemia 09/29/2013  . Hypertension   . Hypothyroidism 09/29/2013  . SBO (small bowel obstruction) (HCC) 03/24/2018  . Thyroid disease     Past Surgical History:  Procedure Laterality Date  . ABDOMINAL HYSTERECTOMY    . THYROID CYST EXCISION      Current Medications: Current Meds  Medication Sig  . divalproex (DEPAKOTE) 125 MG DR tablet Take 125 mg by mouth daily.  Marland Kitchen donepezil (ARICEPT) 5 MG tablet TAKE 1 TAB(S) ORALLY ONCE (AT BEDTIME) FOR 30 DAY(S)  . latanoprost (XALATAN) 0.005 % ophthalmic solution Place 1 drop into the left eye at bedtime.   . meloxicam (MOBIC) 7.5 MG tablet Take 7.5 mg by mouth daily.  Marland Kitchen oxybutynin (DITROPAN-XL) 10 MG 24 hr tablet Take 10 mg by mouth at bedtime.  . pantoprazole (PROTONIX) 40 MG tablet Take 1 tablet (40 mg total) by mouth daily.  Marland Kitchen SYNTHROID 88 MCG tablet Take 88 mcg by mouth daily before breakfast.      Allergies:   Betadine [povidone iodine]   Social History   Socioeconomic History  . Marital status: Married    Spouse name: Not on file  . Number of children: Not on file  . Years of education: Not on file  . Highest education level: Not on file  Occupational History  . Not on file  Tobacco Use  . Smoking status: Never Smoker  . Smokeless tobacco: Never Used  Substance and Sexual Activity  . Alcohol use: Yes    Alcohol/week: 1.0 standard drinks    Types: 1 Glasses of wine per week    Comment: just occasional  . Drug use: No  . Sexual activity: Not on file  Other Topics Concern  . Not on file  Social History Narrative  . Not on file   Social Determinants of Health   Financial Resource Strain:   . Difficulty of Paying Living Expenses:   Food Insecurity:   . Worried About Charity fundraiser in the Last Year:   . Arboriculturist in the Last Year:   Transportation Needs:   . Film/video editor (Medical):   Marland Kitchen Lack of Transportation  (Non-Medical):   Physical Activity:   . Days of Exercise per Week:   . Minutes of Exercise per Session:   Stress:   . Feeling of Stress :   Social Connections:   . Frequency of Communication with Friends and Family:   . Frequency of Social Gatherings with Friends and Family:   . Attends Religious Services:   . Active Member of Clubs or Organizations:   . Attends Archivist Meetings:   Marland Kitchen Marital Status:      Family History: The patient's family history includes Arthritis in her mother; Cancer in her brother; Heart attack in her father; Heart failure in her mother.  ROS:   Please see the history of present illness.    All other systems reviewed and are negative.  EKGs/Labs/Other Studies Reviewed:    The following studies were reviewed today: I discussed my findings with the patient at length.   Recent Labs: 11/11/2018: BUN 11; Creatinine, Ser 0.85; Potassium 4.3; Sodium 143  Recent Lipid Panel No results found for: CHOL, TRIG, HDL, CHOLHDL, VLDL, LDLCALC, LDLDIRECT  Physical Exam:    VS:  BP 112/74   Pulse 79   Temp 97.8 F (36.6 C)   Ht 5\' 3"  (1.6 m)   Wt 166 lb 6.4 oz (75.5 kg)   SpO2 96%   BMI 29.48 kg/m     Wt Readings from Last 3 Encounters:  07/07/19 166 lb 6.4 oz (75.5 kg)  11/09/18 157 lb (71.2 kg)  05/07/18 155 lb (70.3 kg)     GEN: Patient is in no acute distress HEENT: Normal NECK: No JVD; No carotid bruits LYMPHATICS: No lymphadenopathy CARDIAC: Hear sounds regular, 2/6 systolic murmur at the apex. RESPIRATORY:  Clear to auscultation without rales, wheezing or rhonchi  ABDOMEN: Soft, non-tender, non-distended MUSCULOSKELETAL:  No edema; No deformity  SKIN: Warm and dry NEUROLOGIC:  Alert and oriented x 3 PSYCHIATRIC:  Normal affect   Signed, Jordan Lindau, MD  07/07/2019 4:11 PM    [facility]

## 2019-07-07 NOTE — Patient Instructions (Signed)
Medication Instructions:  No medication changes *If you need a refill on your cardiac medications before your next appointment, please call your pharmacy*   Lab Work: None ordered If you have labs (blood work) drawn today and your tests are completely normal, you will receive your results only by: . MyChart Message (if you have MyChart) OR . A paper copy in the mail If you have any lab test that is abnormal or we need to change your treatment, we will call you to review the results.   Testing/Procedures: Your physician has requested that you have a lexiscan myoview. For further information please visit [email or web address]. Please follow instruction sheet, as given.  The test will take approximately 3 to 4 hours to complete; you may bring reading material.  If someone comes with you to your appointment, they will need to remain in the main lobby due to limited space in the testing area. **If you are pregnant or breastfeeding, please notify the nuclear lab prior to your appointment**  How to prepare for your Myocardial Perfusion Test: . Do not eat or drink 3 hours prior to your test, except you may have water. . Do not consume products containing caffeine (regular or decaffeinated) 12 hours prior to your test. (ex: coffee, chocolate, sodas, tea). . Do bring a list of your current medications with you.  If not listed below, you may take your medications as normal. . Do wear comfortable clothes (no dresses or overalls) and walking shoes, tennis shoes preferred (No heels or open toe shoes are allowed). . Do NOT wear cologne, perfume, aftershave, or lotions (deodorant is allowed). . If these instructions are not followed, your test will have to be rescheduled.    Follow-Up: At [facility], you and your health needs are our priority.  As part of our continuing mission to provide you with exceptional heart care, we have created designated Provider Care Teams.  These Care Teams include your  primary Cardiologist (physician) and Advanced Practice Providers (APPs -  Physician Assistants and Nurse Practitioners) who all work together to provide you with the care you need, when you need it.  We recommend signing up for the patient portal called "MyChart".  Sign up information is provided on this After Visit Summary.  MyChart is used to connect with patients for Virtual Visits (Telemedicine).  Patients are able to view lab/test results, encounter notes, upcoming appointments, etc.  Non-urgent messages can be sent to your provider as well.   To learn more about what you can do with MyChart, go to [email or web address].    Your next appointment:   6 month(s)  The format for your next appointment:   In Person  Provider:   Rajan Revankar, MD   Other Instructions NA  

## 2019-07-07 NOTE — Addendum Note (Signed)
Addended by: Eleonore Chiquito on: 07/07/2019 04:18 PM   Modules accepted: Orders

## 2019-07-12 ENCOUNTER — Telehealth (HOSPITAL_COMMUNITY): Payer: Self-pay | Admitting: *Deleted

## 2019-07-12 NOTE — Telephone Encounter (Signed)
Patient given detailed instructions per Myocardial Perfusion Study Information Sheet for the test on 07/14/19 at 11:15. Patient notified to arrive 15 minutes early and that it is imperative to arrive on time for appointment to keep from having the test rescheduled.  If you need to cancel or reschedule your appointment, please call the office within 24 hours of your appointment. . Patient verbalized understanding.Daneil Dolin

## 2019-07-14 ENCOUNTER — Other Ambulatory Visit: Payer: Self-pay

## 2019-07-14 ENCOUNTER — Ambulatory Visit (INDEPENDENT_AMBULATORY_CARE_PROVIDER_SITE_OTHER): Payer: Medicare Other

## 2019-07-14 VITALS — Ht 63.0 in | Wt 166.0 lb

## 2019-07-14 DIAGNOSIS — R079 Chest pain, unspecified: Secondary | ICD-10-CM

## 2019-07-14 LAB — MYOCARDIAL PERFUSION IMAGING
LV dias vol: 56 mL (ref 46–106)
LV sys vol: 21 mL
Peak HR: 83 {beats}/min
Rest HR: 73 {beats}/min
SDS: 0
SRS: 10
SSS: 10
TID: 1

## 2019-07-14 MED ORDER — TECHNETIUM TC 99M TETROFOSMIN IV KIT
29.3000 | PACK | Freq: Once | INTRAVENOUS | Status: AC | PRN
  Administered 2019-07-14: 29.3 via INTRAVENOUS

## 2019-07-14 MED ORDER — TECHNETIUM TC 99M TETROFOSMIN IV KIT
10.9000 | PACK | Freq: Once | INTRAVENOUS | Status: AC | PRN
  Administered 2019-07-14: 10.9 via INTRAVENOUS

## 2019-07-14 MED ORDER — REGADENOSON 0.4 MG/5ML IV SOLN
0.4000 mg | Freq: Once | INTRAVENOUS | Status: AC
  Administered 2019-07-14: 0.4 mg via INTRAVENOUS

## 2019-07-19 ENCOUNTER — Telehealth: Payer: Self-pay

## 2019-07-19 NOTE — Telephone Encounter (Signed)
Left message for pt to call back  °

## 2019-07-20 MED ORDER — NITROGLYCERIN 0.4 MG SL SUBL: 0.4000 mg | SUBLINGUAL_TABLET | SUBLINGUAL | 3 refills | Status: DC | PRN

## 2019-07-20 MED ORDER — ASPIRIN EC 81 MG PO TBEC: 81.0000 mg | DELAYED_RELEASE_TABLET | Freq: Every day | ORAL | 3 refills | Status: DC

## 2019-07-20 NOTE — Addendum Note (Signed)
Addended by: Eleonore Chiquito on: 07/20/2019 04:24 PM   Modules accepted: Orders

## 2019-07-26 ENCOUNTER — Other Ambulatory Visit: Payer: Self-pay

## 2019-07-27 ENCOUNTER — Ambulatory Visit (INDEPENDENT_AMBULATORY_CARE_PROVIDER_SITE_OTHER): Payer: Medicare Other | Admitting: Cardiology

## 2019-07-27 ENCOUNTER — Other Ambulatory Visit: Payer: Self-pay | Admitting: Cardiology

## 2019-07-27 ENCOUNTER — Encounter: Payer: Self-pay | Admitting: Cardiology

## 2019-07-27 ENCOUNTER — Other Ambulatory Visit: Payer: Self-pay

## 2019-07-27 VITALS — BP 128/80 | HR 82 | Ht 63.0 in | Wt 166.0 lb

## 2019-07-27 DIAGNOSIS — Z79899 Other long term (current) drug therapy: Secondary | ICD-10-CM

## 2019-07-27 DIAGNOSIS — E7800 Pure hypercholesterolemia, unspecified: Secondary | ICD-10-CM | POA: Diagnosis not present

## 2019-07-27 DIAGNOSIS — E890 Postprocedural hypothyroidism: Secondary | ICD-10-CM | POA: Diagnosis not present

## 2019-07-27 DIAGNOSIS — R9439 Abnormal result of other cardiovascular function study: Secondary | ICD-10-CM

## 2019-07-27 HISTORY — DX: Abnormal result of other cardiovascular function study: R94.39

## 2019-07-27 NOTE — Addendum Note (Signed)
Addended by: Eleonore Chiquito on: 07/27/2019 12:04 PM   Modules accepted: Orders

## 2019-07-27 NOTE — Progress Notes (Signed)
Cardiology Office Note:    Date:  07/27/2019   ID:  [name], DOB December 31, 1932, MRN [id number]  PCP:  Dema Severin, NP  Cardiologist:  Garwin Brothers, MD   Referring MD: Dema Severin, NP    ASSESSMENT:    1. Abnormal nuclear stress test    PLAN:    In order of problems listed above:  1. Primary prevention stressed with the patient.  Importance of compliance with diet and medication stressed and she vocalized understanding. 2. Angina pectoris: Mild reversibility or peri-infarction ischemia type findings on stress test which were detailed to the patient.  Options of treatment were given including invasive and noninvasive and CT coronary angiography.  We opted for medical management at this time.  She mentions to me that her symptoms occur very sparsely and she would like to go to the medical route.  She is taking aspirin.  She has sublingual nitroglycerin and she understands how to use it.  I want her to be on statin therapy and so she will come back tomorrow morning for fasting blood work. 3. She will be seen in follow-up appointment in 6 to 8-week or earlier if she has any concerns.  She knows to go to the nearest emergency room for any concerning symptoms. 4. Essential hypertension: Blood pressure stable.   Medication Adjustments/Labs and Tests Ordered: Current medicines are reviewed at length with the patient today.  Concerns regarding medicines are outlined above.  No orders of the defined types were placed in this encounter.  No orders of the defined types were placed in this encounter.    Chief Complaint  Patient presents with  . Discuss Test Results     History of Present Illness:    Jordan Horne is a 86 y.o. female.  Patient has past medical history of essential hypertension dyslipidemia.  She leads a sedentary lifestyle.  She has chest tightness at times and she was evaluated for ischemic etiology with stress testing.  The complete report is mentioned  below.  This revealed a fixed defect in the basal inferoseptal and mid inferoseptal location with peri-infarction ischemia findings.  Patient mentions to me that she had one episode of chest tightness relieved with nitroglycerin in the past few weeks.  There is no radiation to the neck or the arms.  She is a poor historian.  Her husband accompanies her for this visit.  At the time of my evaluation, the patient is alert awake oriented and in no distress.  Past Medical History:  Diagnosis Date  . Abnormal electrocardiogram (ECG) (EKG) 05/07/2018  . CAP (community acquired pneumonia) 03/24/2018  . Chest pain of uncertain etiology 09/29/2013  . Glaucoma   . Hypercholesterolemia 09/29/2013  . Hypertension   . Hypothyroidism 09/29/2013  . SBO (small bowel obstruction) (HCC) 03/24/2018  . Thyroid disease     Past Surgical History:  Procedure Laterality Date  . ABDOMINAL HYSTERECTOMY    . THYROID CYST EXCISION      Current Medications: Current Meds  Medication Sig  . aspirin EC 81 MG tablet Take 1 tablet (81 mg total) by mouth daily.  . divalproex (DEPAKOTE) 125 MG DR tablet Take 125 mg by mouth daily.  Marland Kitchen donepezil (ARICEPT) 5 MG tablet TAKE 1 TAB(S) ORALLY ONCE (AT BEDTIME) FOR 30 DAY(S)  . furosemide (LASIX) 20 MG tablet Take 1 tablet (20 mg total) by mouth daily.  Marland Kitchen latanoprost (XALATAN) 0.005 % ophthalmic solution Place 1 drop into the left eye  at bedtime.   Marland Kitchen levothyroxine (SYNTHROID) 75 MCG tablet Take 75 mcg by mouth daily.  . meloxicam (MOBIC) 7.5 MG tablet Take 7.5 mg by mouth daily.  . nitroGLYCERIN (NITROSTAT) 0.4 MG SL tablet Place 1 tablet (0.4 mg total) under the tongue every 5 (five) minutes as needed for chest pain.  Marland Kitchen oxybutynin (DITROPAN-XL) 10 MG 24 hr tablet Take 10 mg by mouth at bedtime.  . pantoprazole (PROTONIX) 40 MG tablet Take 1 tablet (40 mg total) by mouth daily.     Allergies:   Betadine [povidone iodine]   Social History   Socioeconomic History  . Marital  status: Married    Spouse name: Not on file  . Number of children: Not on file  . Years of education: Not on file  . Highest education level: Not on file  Occupational History  . Not on file  Tobacco Use  . Smoking status: Never Smoker  . Smokeless tobacco: Never Used  Substance and Sexual Activity  . Alcohol use: Yes    Alcohol/week: 1.0 standard drinks    Types: 1 Glasses of wine per week    Comment: just occasional  . Drug use: No  . Sexual activity: Not on file  Other Topics Concern  . Not on file  Social History Narrative  . Not on file   Social Determinants of Health   Financial Resource Strain:   . Difficulty of Paying Living Expenses:   Food Insecurity:   . Worried About Charity fundraiser in the Last Year:   . Arboriculturist in the Last Year:   Transportation Needs:   . Film/video editor (Medical):   Marland Kitchen Lack of Transportation (Non-Medical):   Physical Activity:   . Days of Exercise per Week:   . Minutes of Exercise per Session:   Stress:   . Feeling of Stress :   Social Connections:   . Frequency of Communication with Friends and Family:   . Frequency of Social Gatherings with Friends and Family:   . Attends Religious Services:   . Active Member of Clubs or Organizations:   . Attends Archivist Meetings:   Marland Kitchen Marital Status:      Family History: The patient's family history includes Arthritis in her mother; Cancer in her brother; Heart attack in her father; Heart failure in her mother.  ROS:   Please see the history of present illness.    All other systems reviewed and are negative.  EKGs/Labs/Other Studies Reviewed:    The following studies were reviewed today: Study Highlights   The left ventricular ejection fraction is normal (55-65%).  Nuclear stress EF: 62%.  There was no ST segment deviation noted during stress.  No T wave inversion was noted during stress.  Defect 1: There is a medium defect of mild severity present in  the basal inferoseptal and mid inferoseptal location. There is mild hypokinesis of the basal to mid inferolateral wall.  Findings consistent with prior myocardial infarction with a small area of peri-infarct ischemia.  This is an intermediate risk study.        Recent Labs: 11/11/2018: BUN 11; Creatinine, Ser 0.85; Potassium 4.3; Sodium 143  Recent Lipid Panel No results found for: CHOL, TRIG, HDL, CHOLHDL, VLDL, LDLCALC, LDLDIRECT  Physical Exam:    VS:  BP 128/80   Pulse 82   Ht 5\' 3"  (1.6 m)   Wt 166 lb (75.3 kg)   SpO2 97%   BMI 29.41  kg/m     Wt Readings from Last 3 Encounters:  07/27/19 166 lb (75.3 kg)  07/14/19 166 lb (75.3 kg)  07/07/19 166 lb 6.4 oz (75.5 kg)     GEN: Patient is in no acute distress HEENT: Normal NECK: No JVD; No carotid bruits LYMPHATICS: No lymphadenopathy CARDIAC: Hear sounds regular, 2/6 systolic murmur at the apex. RESPIRATORY:  Clear to auscultation without rales, wheezing or rhonchi  ABDOMEN: Soft, non-tender, non-distended MUSCULOSKELETAL:  No edema; No deformity  SKIN: Warm and dry NEUROLOGIC:  Alert and oriented x 3 PSYCHIATRIC:  Normal affect   Signed, Garwin Brothers, MD  07/27/2019 11:38 AM    [facility]

## 2019-07-27 NOTE — Patient Instructions (Signed)
Medication Instructions:  No medication changes *If you need a refill on your cardiac medications before your next appointment, please call your pharmacy*   Lab Work: Your physician recommends that you return for lab work in: tomorrow. You need to have labs done when you are fasting.  You can come Monday through Friday 8:30 am to 12:00 pm and 1:15 to 4:30. You do not need to make an appointment as the order has already been placed. The labs you are going to have done are BMET, CBC, TSH, LFT and Lipids.  Your physician recommends that you return for lab work in: before your visit. You need to have labs done when you are fasting.  You can come Monday through Friday 8:30 am to 12:00 pm and 1:15 to 4:30. You do not need to make an appointment as the order has already been placed. The labs you are going to have done are BMET, LFT and Lipids.   If you have labs (blood work) drawn today and your tests are completely normal, you will receive your results only by: Marland Kitchen MyChart Message (if you have MyChart) OR . A paper copy in the mail If you have any lab test that is abnormal or we need to change your treatment, we will call you to review the results.   Testing/Procedures: None ordered   Follow-Up: At [facility], you and your health needs are our priority.  As part of our continuing mission to provide you with exceptional heart care, we have created designated Provider Care Teams.  These Care Teams include your primary Cardiologist (physician) and Advanced Practice Providers (APPs -  Physician Assistants and Nurse Practitioners) who all work together to provide you with the care you need, when you need it.  We recommend signing up for the patient portal called "MyChart".  Sign up information is provided on this After Visit Summary.  MyChart is used to connect with patients for Virtual Visits (Telemedicine).  Patients are able to view lab/test results, encounter notes, upcoming appointments, etc.   Non-urgent messages can be sent to your provider as well.   To learn more about what you can do with MyChart, go to [email or web address].    Your next appointment:   6 week(s)  The format for your next appointment:   In Person  Provider:   Belva Crome, MD   Other Instructions NA

## 2019-07-30 LAB — LIPID PANEL
Chol/HDL Ratio: 3.6 ratio (ref 0.0–4.4)
Cholesterol, Total: 244 mg/dL — ABNORMAL HIGH (ref 100–199)
HDL: 68 mg/dL (ref >39)
LDL Chol Calc (NIH): 150 mg/dL — ABNORMAL HIGH (ref 0–99)
Triglycerides: 149 mg/dL (ref 0–149)
VLDL Cholesterol Cal: 26 mg/dL (ref 5–40)

## 2019-07-30 LAB — HEPATIC FUNCTION PANEL
ALT: 10 IU/L (ref 0–32)
AST: 15 IU/L (ref 0–40)
Albumin: 4.2 g/dL (ref 3.6–4.6)
Alkaline Phosphatase: 72 IU/L (ref 39–117)
Bilirubin Total: 0.7 mg/dL (ref 0.0–1.2)
Bilirubin, Direct: 0.15 mg/dL (ref 0.00–0.40)
Total Protein: 6.6 g/dL (ref 6.0–8.5)

## 2019-07-30 LAB — CBC WITH DIFFERENTIAL/PLATELET
Basophils Absolute: 0 10*3/uL (ref 0.0–0.2)
Basos: 1 %
EOS (ABSOLUTE): 0.3 10*3/uL (ref 0.0–0.4)
Eos: 5 %
Hematocrit: 42.5 % (ref 34.0–46.6)
Hemoglobin: 14.4 g/dL (ref 11.1–15.9)
Immature Grans (Abs): 0 10*3/uL (ref 0.0–0.1)
Immature Granulocytes: 0 %
Lymphocytes Absolute: 2.2 10*3/uL (ref 0.7–3.1)
Lymphs: 36 %
MCH: 32.5 pg (ref 26.6–33.0)
MCHC: 33.9 g/dL (ref 31.5–35.7)
MCV: 96 fL (ref 79–97)
Monocytes Absolute: 0.6 10*3/uL (ref 0.1–0.9)
Monocytes: 10 %
Neutrophils Absolute: 3 10*3/uL (ref 1.4–7.0)
Neutrophils: 48 %
Platelets: 177 10*3/uL (ref 150–450)
RBC: 4.43 x10E6/uL (ref 3.77–5.28)
RDW: 13.4 % (ref 11.7–15.4)
WBC: 6.1 10*3/uL (ref 3.4–10.8)

## 2019-07-30 LAB — BASIC METABOLIC PANEL
BUN/Creatinine Ratio: 14 (ref 12–28)
BUN: 13 mg/dL (ref 8–27)
CO2: 26 mmol/L (ref 20–29)
Calcium: 9.6 mg/dL (ref 8.7–10.3)
Chloride: 106 mmol/L (ref 96–106)
Creatinine, Ser: 0.96 mg/dL (ref 0.57–1.00)
GFR calc Af Amer: 63 mL/min/{1.73_m2} (ref >59)
GFR calc non Af Amer: 54 mL/min/{1.73_m2} — ABNORMAL LOW (ref >59)
Glucose: 80 mg/dL (ref 65–99)
Potassium: 4.2 mmol/L (ref 3.5–5.2)
Sodium: 145 mmol/L — ABNORMAL HIGH (ref 134–144)

## 2019-07-30 LAB — TSH: TSH: 3.26 u[IU]/mL (ref 0.450–4.500)

## 2019-07-30 MED ORDER — ROSUVASTATIN CALCIUM 10 MG PO TABS
10.0000 mg | ORAL_TABLET | Freq: Every day | ORAL | 12 refills | Status: DC
Start: 2019-07-30 — End: 2020-03-08

## 2019-07-30 NOTE — Addendum Note (Signed)
Addended by: Eleonore Chiquito on: 07/30/2019 01:23 PM   Modules accepted: Orders

## 2019-08-12 ENCOUNTER — Emergency Department (HOSPITAL_COMMUNITY): Payer: Medicare Other

## 2019-08-12 ENCOUNTER — Emergency Department (HOSPITAL_COMMUNITY)
Admission: EM | Admit: 2019-08-12 | Discharge: 2019-08-12 | Disposition: A | Discharge status: Home / Self Care | Payer: Medicare Other | Attending: Emergency Medicine | Admitting: Emergency Medicine

## 2019-08-12 ENCOUNTER — Other Ambulatory Visit: Payer: Self-pay

## 2019-08-12 DIAGNOSIS — S0990XA Unspecified injury of head, initial encounter: Secondary | ICD-10-CM | POA: Diagnosis present

## 2019-08-12 DIAGNOSIS — M549 Dorsalgia, unspecified: Secondary | ICD-10-CM | POA: Diagnosis not present

## 2019-08-12 DIAGNOSIS — F0390 Unspecified dementia without behavioral disturbance: Secondary | ICD-10-CM | POA: Insufficient documentation

## 2019-08-12 DIAGNOSIS — W19XXXA Unspecified fall, initial encounter: Secondary | ICD-10-CM

## 2019-08-12 DIAGNOSIS — Z79899 Other long term (current) drug therapy: Secondary | ICD-10-CM | POA: Insufficient documentation

## 2019-08-12 DIAGNOSIS — W109XXA Fall (on) (from) unspecified stairs and steps, initial encounter: Secondary | ICD-10-CM | POA: Diagnosis not present

## 2019-08-12 DIAGNOSIS — I10 Essential (primary) hypertension: Secondary | ICD-10-CM | POA: Insufficient documentation

## 2019-08-12 DIAGNOSIS — Y999 Unspecified external cause status: Secondary | ICD-10-CM | POA: Insufficient documentation

## 2019-08-12 DIAGNOSIS — S0003XA Contusion of scalp, initial encounter: Secondary | ICD-10-CM | POA: Diagnosis not present

## 2019-08-12 DIAGNOSIS — M25552 Pain in left hip: Secondary | ICD-10-CM | POA: Diagnosis not present

## 2019-08-12 DIAGNOSIS — M542 Cervicalgia: Secondary | ICD-10-CM | POA: Insufficient documentation

## 2019-08-12 DIAGNOSIS — E039 Hypothyroidism, unspecified: Secondary | ICD-10-CM | POA: Insufficient documentation

## 2019-08-12 DIAGNOSIS — Y9389 Activity, other specified: Secondary | ICD-10-CM | POA: Insufficient documentation

## 2019-08-12 DIAGNOSIS — Z7982 Long term (current) use of aspirin: Secondary | ICD-10-CM | POA: Diagnosis not present

## 2019-08-12 DIAGNOSIS — Y929 Unspecified place or not applicable: Secondary | ICD-10-CM | POA: Diagnosis not present

## 2019-08-12 DIAGNOSIS — R1084 Generalized abdominal pain: Secondary | ICD-10-CM | POA: Insufficient documentation

## 2019-08-12 LAB — CBC WITH DIFFERENTIAL/PLATELET
Abs Immature Granulocytes: 0.03 10*3/uL (ref 0.00–0.07)
Basophils Absolute: 0.1 10*3/uL (ref 0.0–0.1)
Basophils Relative: 1 %
Eosinophils Absolute: 0.3 10*3/uL (ref 0.0–0.5)
Eosinophils Relative: 3 %
HCT: 42.2 % (ref 36.0–46.0)
Hemoglobin: 13.7 g/dL (ref 12.0–15.0)
Immature Granulocytes: 0 %
Lymphocytes Relative: 20 %
Lymphs Abs: 1.5 10*3/uL (ref 0.7–4.0)
MCH: 31.9 pg (ref 26.0–34.0)
MCHC: 32.5 g/dL (ref 30.0–36.0)
MCV: 98.1 fL (ref 80.0–100.0)
Monocytes Absolute: 0.8 10*3/uL (ref 0.1–1.0)
Monocytes Relative: 10 %
Neutro Abs: 5.1 10*3/uL (ref 1.7–7.7)
Neutrophils Relative %: 66 %
Platelets: 152 10*3/uL (ref 150–400)
RBC: 4.3 MIL/uL (ref 3.87–5.11)
RDW: 12.9 % (ref 11.5–15.5)
WBC: 7.7 10*3/uL (ref 4.0–10.5)
nRBC: 0 % (ref 0.0–0.2)

## 2019-08-12 LAB — COMPREHENSIVE METABOLIC PANEL
ALT: 17 U/L (ref 0–44)
AST: 21 U/L (ref 15–41)
Albumin: 3.5 g/dL (ref 3.5–5.0)
Alkaline Phosphatase: 60 U/L (ref 38–126)
Anion gap: 10 (ref 5–15)
BUN: 13 mg/dL (ref 8–23)
CO2: 22 mmol/L (ref 22–32)
Calcium: 8.4 mg/dL — ABNORMAL LOW (ref 8.9–10.3)
Chloride: 108 mmol/L (ref 98–111)
Creatinine, Ser: 0.9 mg/dL (ref 0.44–1.00)
GFR calc Af Amer: >60 mL/min (ref >60)
GFR calc non Af Amer: 59 mL/min — ABNORMAL LOW (ref >60)
Glucose, Bld: 91 mg/dL (ref 70–99)
Potassium: 4.3 mmol/L (ref 3.5–5.1)
Sodium: 140 mmol/L (ref 135–145)
Total Bilirubin: 0.8 mg/dL (ref 0.3–1.2)
Total Protein: 5.9 g/dL — ABNORMAL LOW (ref 6.5–8.1)

## 2019-08-12 LAB — TROPONIN I (HIGH SENSITIVITY)
Troponin I (High Sensitivity): 5 ng/L (ref <18)
Troponin I (High Sensitivity): 5 ng/L (ref <18)

## 2019-08-12 LAB — URINALYSIS, ROUTINE W REFLEX MICROSCOPIC
Bilirubin Urine: NEGATIVE
Glucose, UA: NEGATIVE mg/dL
Hgb urine dipstick: NEGATIVE
Ketones, ur: NEGATIVE mg/dL
Leukocytes,Ua: NEGATIVE
Nitrite: NEGATIVE
Protein, ur: NEGATIVE mg/dL
Specific Gravity, Urine: 1.018 (ref 1.005–1.030)
pH: 9 — ABNORMAL HIGH (ref 5.0–8.0)

## 2019-08-12 LAB — LIPASE, BLOOD: Lipase: 22 U/L (ref 11–51)

## 2019-08-12 NOTE — ED Provider Notes (Signed)
[facility] EMERGENCY DEPARTMENT Provider Note   CSN: [id number] Arrival date & time: 08/12/19  1053     History Chief Complaint  Patient presents with  . Fall  . Chest Pain    Jordan Horne is a 86 y.o. female with a past medical history significant for glaucoma, hyperlipidemia, hypertension, hypothyroidism, and history of small bowel obstruction who presents to the ED via EMS after a mechanical fall that occurred yesterday.  Patient notes she fell down 5 stairs yesterday directly on her back when her legs "got tangled up".  She admits to hitting her head, but denies loss of consciousness.  She is not currently on any blood thinners.  Patient notes she hurts "all over" especially in her neck and back.  Admits to a posterior headache directly where she fell.  She also admits to having abdominal pain earlier this morning.  Patient denies visual changes, nausea, and vomiting.  Patient notes back pain is associated with left hip pain, worse with ambulation.  She typically ambulates without any assistance.  No treatment prior to arrival.  Denies chest pain and shortness of breath.  Denies saddle paresthesias, bowel/bladder incontinence, lower extremity numbness, and lower extremity weakness.   Level 5 caveat secondary to history of dementia.  History obtained from patient, husband, and past medical records. No interpreter used during encounter.      Past Medical History:  Diagnosis Date  . Abnormal electrocardiogram (ECG) (EKG) 05/07/2018  . CAP (community acquired pneumonia) 03/24/2018  . Chest pain of uncertain etiology 09/29/2013  . Glaucoma   . Hypercholesterolemia 09/29/2013  . Hypertension   . Hypothyroidism 09/29/2013  . SBO (small bowel obstruction) (HCC) 03/24/2018  . Thyroid disease     Patient Active Problem List   Diagnosis Date Noted  . Abnormal nuclear stress test 07/27/2019  . Abnormal electrocardiogram (ECG) (EKG) 05/07/2018  . SBO (small bowel  obstruction) (HCC) 03/24/2018  . CAP (community acquired pneumonia) 03/24/2018  . Chest pain of uncertain etiology 09/29/2013  . Hypothyroidism 09/29/2013  . Hypercholesterolemia 09/29/2013    Past Surgical History:  Procedure Laterality Date  . ABDOMINAL HYSTERECTOMY    . THYROID CYST EXCISION       OB History   No obstetric history on file.     Family History  Problem Relation Age of Onset  . Arthritis Mother   . Heart failure Mother   . Heart attack Father   . Cancer Brother     Social History   Tobacco Use  . Smoking status: Never Smoker  . Smokeless tobacco: Never Used  Substance Use Topics  . Alcohol use: Yes    Alcohol/week: 1.0 standard drinks    Types: 1 Glasses of wine per week    Comment: just occasional  . Drug use: No    Home Medications Prior to Admission medications   Medication Sig Start Date End Date Taking? Authorizing Provider  Artificial Tear Solution (SYSTANE CONTACTS OP) Place 1 drop into both eyes daily.   Yes [provider]  aspirin EC 81 MG tablet Take 1 tablet (81 mg total) by mouth daily. 07/20/19  Yes Revankar, Aundra Dubin, MD  divalproex (DEPAKOTE) 125 MG DR tablet Take 125 mg by mouth daily. 04/27/18  Yes [provider]  donepezil (ARICEPT) 5 MG tablet Take 5 mg by mouth at bedtime.  10/24/18  Yes [provider]  furosemide (LASIX) 20 MG tablet Take 1 tablet (20 mg total) by mouth daily. 03/31/18 08/12/19  Yes Sheikh, Omair Latif, DO  levothyroxine (SYNTHROID) 75 MCG tablet Take 75 mcg by mouth daily. 07/08/19  Yes [provider]  meloxicam (MOBIC) 7.5 MG tablet Take 7.5 mg by mouth daily. 06/28/19  Yes [provider]  nitroGLYCERIN (NITROSTAT) 0.4 MG SL tablet Place 1 tablet (0.4 mg total) under the tongue every 5 (five) minutes as needed for chest pain. 07/20/19 10/18/19 Yes Revankar, Reita Cliche, MD  pantoprazole (PROTONIX) 40 MG tablet Take 1 tablet (40 mg total) by mouth daily. 03/31/18  Yes Sheikh, Omair  Latif, DO  rosuvastatin (CRESTOR) 10 MG tablet Take 1 tablet (10 mg total) by mouth daily. 07/30/19 10/28/19 Yes Revankar, Reita Cliche, MD    Allergies    Betadine [povidone iodine]  Review of Systems   Review of Systems  Unable to perform ROS: Dementia  Constitutional: Negative for chills and fever.  Respiratory: Negative for shortness of breath.   Cardiovascular: Negative for chest pain.  Gastrointestinal: Positive for abdominal pain. Negative for diarrhea, nausea and vomiting.  Musculoskeletal: Positive for arthralgias, back pain and neck pain.  Neurological: Positive for headaches. Negative for facial asymmetry, speech difficulty and numbness.  All other systems reviewed and are negative.   Physical Exam Updated Vital Signs BP 121/64 (BP Location: Right Arm)   Pulse 80   Temp 98.7 F (37.1 C) (Oral)   Resp 12   Ht 5\' 3"  (1.6 m)   Wt 73.9 kg   SpO2 93%   BMI 28.87 kg/m   Physical Exam Vitals and nursing note reviewed.  Constitutional:      General: She is not in acute distress.    Appearance: She is not ill-appearing.  HENT:     Head: Normocephalic.     Comments: No battle sign, raccoon eyes, hemotympanum, septal hematomas, or malocclusion. Eyes:     Extraocular Movements: Extraocular movements intact.     Comments: Pupils two different sizes (R>L)  Neck:     Comments: Mild midline cervical tenderness. Cardiovascular:     Rate and Rhythm: Normal rate and regular rhythm.     Pulses: Normal pulses.     Heart sounds: Normal heart sounds. No murmur. No friction rub. No gallop.   Pulmonary:     Effort: Pulmonary effort is normal.     Breath sounds: Normal breath sounds.  Abdominal:     General: Abdomen is flat. Bowel sounds are normal. There is no distension.     Palpations: Abdomen is soft.     Tenderness: There is no abdominal tenderness. There is no guarding or rebound.     Comments: Abdomen soft, nondistended, nontender to palpation in all quadrants without guarding  or peritoneal signs. No rebound.   Musculoskeletal:     Cervical back: Neck supple.     Comments: No thoracic or lumbar midline tenderness. Tenderness to palpation over left hip. Full flexion and extension of bilateral hips. Distal pulses and sensation intact.   Skin:    General: Skin is warm and dry.  Neurological:     General: No focal deficit present.     Mental Status: She is alert.     Comments: Speech is clear, able to follow commands CN III-XII intact Normal strength in upper and lower extremities bilaterally including dorsiflexion and plantar flexion, strong and equal grip strength Sensation grossly intact throughout Moves extremities without ataxia, coordination intact No pronator drift  Psychiatric:        Mood and Affect: Mood normal.  Behavior: Behavior normal.     ED Results / Procedures / Treatments   Labs (all labs ordered are listed, but only abnormal results are displayed) Labs Reviewed  COMPREHENSIVE METABOLIC PANEL - Abnormal; Notable for the following components:      Result Value   Calcium 8.4 (*)    Total Protein 5.9 (*)    GFR calc non Af Amer 59 (*)    All other components within normal limits  URINALYSIS, ROUTINE W REFLEX MICROSCOPIC - Abnormal; Notable for the following components:   APPearance CLOUDY (*)    pH 9.0 (*)    All other components within normal limits  CBC WITH DIFFERENTIAL/PLATELET  LIPASE, BLOOD  TROPONIN I (HIGH SENSITIVITY)  TROPONIN I (HIGH SENSITIVITY)    EKG EKG Interpretation  Date/Time:  Thursday Aug 12 2019 14:01:47 EDT Ventricular Rate:  79 PR Interval:    QRS Duration: 158 QT Interval:  410 QTC Calculation: 470 R Axis:   -74 Text Interpretation: Sinus rhythm Supraventricular bigeminy Borderline prolonged PR interval Consider left atrial enlargement Right bundle branch block Left ventricular hypertrophy Inferior infarct, old Confirmed by Tilden Fossa [phone]) on 08/12/2019 2:15:25 PM   Radiology DG Chest 1  View  Result Date: 08/12/2019 CLINICAL DATA:  Fall EXAM: CHEST  1 VIEW COMPARISON:  11/25/2018 FINDINGS: Heart is mildly enlarged. Aortic atherosclerosis. No confluent airspace opacities or effusions. No acute bony abnormality. IMPRESSION: Cardiomegaly.  No active disease. Electronically Signed   By: Charlett Nose M.D.   On: 08/12/2019 12:04   CT Head Wo Contrast  Result Date: 08/12/2019 CLINICAL DATA:  Fall yesterday, head trauma, headache and neck pain EXAM: CT HEAD WITHOUT CONTRAST CT CERVICAL SPINE WITHOUT CONTRAST TECHNIQUE: Multidetector CT imaging of the head and cervical spine was performed following the standard protocol without intravenous contrast. Multiplanar CT image reconstructions of the cervical spine were also generated. COMPARISON:  None. FINDINGS: CT HEAD FINDINGS Brain: Brain atrophy pattern and chronic white matter microvascular ischemic changes. Benign punctate parenchymal calcification in the left periventricular white matter, image 20 series 3. Cerebellar atrophy as well. No acute intracranial hemorrhage, mass lesion, definite new infarction, midline shift, herniation, hydrocephalus, or extra-axial fluid collection. No focal mass effect or edema. Cisterns are patent. Vascular: Intracranial atherosclerosis.  No hyperdense vessel. Skull: Normal. Negative for fracture or focal lesion. Sinuses/Orbits: No acute finding. Other: Left parieto-occipital scalp hematoma noted without underlying skull fracture. CT CERVICAL SPINE FINDINGS Alignment: No malalignment, subluxation or dislocation. Facets aligned. Skull base and vertebrae: No acute fracture. No primary bone lesion or focal pathologic process. Soft tissues and spinal canal: No prevertebral fluid or soft tissue swelling. No visible canal hyperdense hematoma. Carotid atherosclerosis evident. Disc levels: Advanced degenerative changes at C5-6 with marked disc space narrowing, sclerosis and endplate osteophytes. Additional multilevel mild  degenerative disc disease and facet arthropathy. Intact odontoid. Upper chest: Negative. Other: None. IMPRESSION: Left posterior parietal scalp hematoma. Brain atrophy and chronic white matter microvascular changes. No acute intracranial abnormality by noncontrast CT. Cervical spine degenerative changes without malalignment or acute osseous finding. Negative for fracture. Carotid atherosclerosis Electronically Signed   By: Judie Petit.  Shick M.D.   On: 08/12/2019 13:32   CT Cervical Spine Wo Contrast  Result Date: 08/12/2019 CLINICAL DATA:  Fall yesterday, head trauma, headache and neck pain EXAM: CT HEAD WITHOUT CONTRAST CT CERVICAL SPINE WITHOUT CONTRAST TECHNIQUE: Multidetector CT imaging of the head and cervical spine was performed following the standard protocol without intravenous contrast. Multiplanar CT image reconstructions of the  cervical spine were also generated. COMPARISON:  None. FINDINGS: CT HEAD FINDINGS Brain: Brain atrophy pattern and chronic white matter microvascular ischemic changes. Benign punctate parenchymal calcification in the left periventricular white matter, image 20 series 3. Cerebellar atrophy as well. No acute intracranial hemorrhage, mass lesion, definite new infarction, midline shift, herniation, hydrocephalus, or extra-axial fluid collection. No focal mass effect or edema. Cisterns are patent. Vascular: Intracranial atherosclerosis.  No hyperdense vessel. Skull: Normal. Negative for fracture or focal lesion. Sinuses/Orbits: No acute finding. Other: Left parieto-occipital scalp hematoma noted without underlying skull fracture. CT CERVICAL SPINE FINDINGS Alignment: No malalignment, subluxation or dislocation. Facets aligned. Skull base and vertebrae: No acute fracture. No primary bone lesion or focal pathologic process. Soft tissues and spinal canal: No prevertebral fluid or soft tissue swelling. No visible canal hyperdense hematoma. Carotid atherosclerosis evident. Disc levels: Advanced  degenerative changes at C5-6 with marked disc space narrowing, sclerosis and endplate osteophytes. Additional multilevel mild degenerative disc disease and facet arthropathy. Intact odontoid. Upper chest: Negative. Other: None. IMPRESSION: Left posterior parietal scalp hematoma. Brain atrophy and chronic white matter microvascular changes. No acute intracranial abnormality by noncontrast CT. Cervical spine degenerative changes without malalignment or acute osseous finding. Negative for fracture. Carotid atherosclerosis Electronically Signed   By: Judie Petit.  Shick M.D.   On: 08/12/2019 13:32   DG Hips Bilat W or Wo Pelvis 5 Views  Result Date: 08/12/2019 CLINICAL DATA:  Fall, bilateral hip pain EXAM: DG HIP (WITH OR WITHOUT PELVIS) 5+V BILAT COMPARISON:  None. FINDINGS: There is no evidence of hip fracture or dislocation. Mild degenerative changes of the bilateral hips and SI joints. Lower lumbar spondylosis. Bones are demineralized. There is no evidence of focal bone abnormality. IMPRESSION: No acute osseous abnormality of the bilateral hips. Electronically Signed   By: Duanne Guess D.O.   On: 08/12/2019 12:06    Procedures Procedures (including critical care time)  Medications Ordered in ED Medications - No data to display  ED Course  I have reviewed the triage vital signs and the nursing notes.  Pertinent labs & imaging results that were available during my care of the patient were reviewed by me and considered in my medical decision making (see chart for details).  Clinical Course as of Aug 11 1641  Thu Aug 12, 2019  1325 Attempted to call patient's husband, Annice Pih, on number provided with no answer.    [CA]    Clinical Course User Index [CA] Audelia Acton Raina Mina   MDM Rules/Calculators/A&P                     86 year old female presents to the ED after a mechanical fall that occurred yesterday.  Patient fell down 5 stairs and hit the posterior aspect of her head.  No loss of  consciousness.  She is not currently on any blood thinners.  Patient also admits to some generalized abdominal pain earlier this morning.  Upon arrival, vitals all within normal limits.  Patient in no acute distress and nontoxic-appearing.  Mild cervical midline tenderness. Two different sized pupils (R>L) likely due to history of glaucoma, otherwise, normal neurological exam. Small hematoma on left posterior head. No signs of basilar skull fracture. Will obtain CT head and cervical spine to rule out intracranial abnormalities and bony fractures. Bilateral hip x-ray ordered to rule out bony fracture. Will also obtain routine abdominal labs and troponin given abdominal pain earlier this morning.   He unremarkable no leukocytosis and normal hemoglobin.  Lipase  normal at 22.  Doubt pancreatitis.  CMP reassuring with normal renal function and no major electrolyte derangements.  Troponin normal at 5.  Given patient has a history of dementia and I have been unable to get in touch with husband, will obtain delta troponin to rule out ACS. Bilateral hip and chest x-ray personally reviewed which is negative for any acute abnormalities.  CT head/cervical spine personally reviewed which demonstrates: IMPRESSION:  Left posterior parietal scalp hematoma.    Brain atrophy and chronic white matter microvascular changes. No  acute intracranial abnormality by noncontrast CT.    Cervical spine degenerative changes without malalignment or acute  osseous finding. Negative for fracture.    Carotid atherosclerosis   Delta troponin flat. No concern for ACS. EKG personally reviewed which demonstrates sinus rhythm with no signs of acute ischemia.  2:55 PM reassessed patient at beside. Husband is at bedside and gave same history as patient about falling yesterday. Patient was able to ambulate here in the ED without difficulty.   UA negative for signs of infection.  Advised patient to take over-the-counter Tylenol as  needed for pain.  Instructed patient to follow-up with PCP within the next week for further evaluation. Strict ED precautions discussed with patient. Patient states understanding and agrees to plan. Patient discharged home in no acute distress and stable vitals   Discussed case with Madilyn Hookees who evaluated patient at bedside and agrees with assessment and plan.  Final Clinical Impression(s) / ED Diagnoses Final diagnoses:  Fall, initial encounter  Generalized abdominal pain  Neck pain    Rx / DC Orders ED Discharge Orders    None       Jesusita Okaberman, Cahterine Heinzel C, PA-C 08/12/19 1643    Tilden Fossaees, Elizabeth, MD 08/13/19 1212

## 2019-08-12 NOTE — ED Notes (Signed)
Pt denies any chest pain but states she has pain in her back and neck.

## 2019-08-12 NOTE — Discharge Instructions (Addendum)
As discussed, all of your labs and images were reassuring. You have no broken bones. You may take over the counter Tylenol as needed for pain. Please follow-up with PCP within the next week for further evaluation. Return to the ER for new or worsening symptoms.

## 2019-08-12 NOTE — ED Notes (Signed)
Patient verbalizes understanding of discharge instructions. Opportunity for questioning and answers were provided. Armband removed by staff, pt discharged from ED ambulatory.   

## 2019-08-12 NOTE — ED Triage Notes (Signed)
Patient arrives via ems from home after having a fall yesterday. Per ems, the patient fell down about 5 stairs yesterday and her husband had to help her back to bed. The patient woke up this morning and have severe generalized pain all over, more pain in her neck, and had difficulty ambulating. Her husband called ems due to having difficulty helping the patient with ambulating/ adl's.   Per ems, the patient began stating the she was having abd pain that was radiating to her jaw when she arrived to the ED as well.   Patient has a history chf and Alzheimers.

## 2019-09-09 ENCOUNTER — Encounter: Payer: Self-pay | Admitting: Cardiology

## 2019-09-09 ENCOUNTER — Ambulatory Visit (INDEPENDENT_AMBULATORY_CARE_PROVIDER_SITE_OTHER): Payer: Medicare Other | Admitting: Cardiology

## 2019-09-09 ENCOUNTER — Other Ambulatory Visit: Payer: Self-pay

## 2019-09-09 VITALS — BP 132/82 | HR 82 | Ht 63.0 in | Wt 162.0 lb

## 2019-09-09 DIAGNOSIS — R9439 Abnormal result of other cardiovascular function study: Secondary | ICD-10-CM

## 2019-09-09 DIAGNOSIS — E7800 Pure hypercholesterolemia, unspecified: Secondary | ICD-10-CM

## 2019-09-09 NOTE — Patient Instructions (Signed)
Medication Instructions:  No medication changes. *If you need a refill on your cardiac medications before your next appointment, please call your pharmacy*   Lab Work: Your physician recommends that you return for lab work in: before your next visit. You need to have labs done when you are fasting.  You can come Monday through Friday 8:30 am to 12:00 pm and 1:15 to 4:30. You do not need to make an appointment as the order has already been placed. The labs you are going to have done are BMET, LFT and Lipids.  If you have labs (blood work) drawn today and your tests are completely normal, you will receive your results only by: . MyChart Message (if you have MyChart) OR . A paper copy in the mail If you have any lab test that is abnormal or we need to change your treatment, we will call you to review the results.   Testing/Procedures: None ordered   Follow-Up: At [facility], you and your health needs are our priority.  As part of our continuing mission to provide you with exceptional heart care, we have created designated Provider Care Teams.  These Care Teams include your primary Cardiologist (physician) and Advanced Practice Providers (APPs -  Physician Assistants and Nurse Practitioners) who all work together to provide you with the care you need, when you need it.  We recommend signing up for the patient portal called "MyChart".  Sign up information is provided on this After Visit Summary.  MyChart is used to connect with patients for Virtual Visits (Telemedicine).  Patients are able to view lab/test results, encounter notes, upcoming appointments, etc.  Non-urgent messages can be sent to your provider as well.   To learn more about what you can do with MyChart, go to [email or web address].    Your next appointment:   2 month(s)  The format for your next appointment:   In Person  Provider:   Rajan Revankar, MD   Other Instructions NA  

## 2019-09-09 NOTE — Progress Notes (Signed)
Cardiology Office Note:    Date:  09/09/2019   ID:  [name], DOB 1933/01/22, MRN [id number]  PCP:  Imagene Riches, NP  Cardiologist:  Jenean Lindau, MD   Referring MD: Imagene Riches, NP    ASSESSMENT:    1. Hypercholesterolemia   2. Abnormal nuclear stress test    PLAN:    In order of problems listed above:  1. Primary prevention stressed with the patient. Importance of compliance with diet medication stressed she vocalized understanding. She is active indoors without any symptoms and will continue medical management at this time. 2. Abnormal nuclear stress test: Management is medical and patient tolerating well. They have requested this in view of her multiple comorbidities including dementia issues 3. Mixed dyslipidemia: Diet was emphasized weight reduction was stressed she be seen in follow-up appointment in 2 months or earlier if she has any concerns. At the time she will have a fasting liver lipid check. She knows to go to nearest emergency room for any concerning symptoms.   Medication Adjustments/Labs and Tests Ordered: Current medicines are reviewed at length with the patient today.  Concerns regarding medicines are outlined above.  Orders Placed This Encounter  Procedures  . Basic metabolic panel  . Hepatic function panel  . Lipid panel   No orders of the defined types were placed in this encounter.    No chief complaint on file.    History of Present Illness:    Jordan Horne is a 86 y.o. female. Patient has past medical history of mixed dyslipidemia and abnormal nuclear stress test. She has mild dementia. Her husband accompanies her for this visit. She denies any chest pain orthopnea or PND. She leads a sedentary lifestyle but is active indoors. At the time of my evaluation, the patient is alert awake oriented and in no distress.  Past Medical History:  Diagnosis Date  . Abnormal electrocardiogram (ECG) (EKG) 05/07/2018  . CAP (community  acquired pneumonia) 03/24/2018  . Chest pain of uncertain etiology 05/29/6438  . Glaucoma   . Hypercholesterolemia 09/29/2013  . Hypertension   . Hypothyroidism 09/29/2013  . SBO (small bowel obstruction) ([facility]) 03/24/2018  . Thyroid disease     Past Surgical History:  Procedure Laterality Date  . ABDOMINAL HYSTERECTOMY    . THYROID CYST EXCISION      Current Medications: Current Meds  Medication Sig  . Artificial Tear Solution (SYSTANE CONTACTS OP) Place 1 drop into both eyes daily.  Marland Kitchen aspirin EC 81 MG tablet Take 1 tablet (81 mg total) by mouth daily.  . divalproex (DEPAKOTE) 125 MG DR tablet Take 125 mg by mouth daily.  Marland Kitchen donepezil (ARICEPT) 5 MG tablet Take 5 mg by mouth at bedtime.   . furosemide (LASIX) 20 MG tablet Take 1 tablet (20 mg total) by mouth daily.  Marland Kitchen levothyroxine (SYNTHROID) 75 MCG tablet Take 75 mcg by mouth daily.  . meloxicam (MOBIC) 7.5 MG tablet Take 7.5 mg by mouth daily.  . nitroGLYCERIN (NITROSTAT) 0.4 MG SL tablet Place 1 tablet (0.4 mg total) under the tongue every 5 (five) minutes as needed for chest pain.  Marland Kitchen oxybutynin (DITROPAN-XL) 10 MG 24 hr tablet Take 10 mg by mouth daily.  . pantoprazole (PROTONIX) 40 MG tablet Take 1 tablet (40 mg total) by mouth daily.  . rosuvastatin (CRESTOR) 10 MG tablet Take 1 tablet (10 mg total) by mouth daily.     Allergies:   Betadine [povidone iodine]   Social History  Socioeconomic History  . Marital status: Married    Spouse name: Not on file  . Number of children: Not on file  . Years of education: Not on file  . Highest education level: Not on file  Occupational History  . Not on file  Tobacco Use  . Smoking status: Never Smoker  . Smokeless tobacco: Never Used  Substance and Sexual Activity  . Alcohol use: Yes    Alcohol/week: 1.0 standard drink    Types: 1 Glasses of wine per week    Comment: just occasional  . Drug use: No  . Sexual activity: Not on file  Other Topics Concern  . Not on file    Social History Narrative  . Not on file   Social Determinants of Health   Financial Resource Strain:   . Difficulty of Paying Living Expenses:   Food Insecurity:   . Worried About Programme researcher, broadcasting/film/video in the Last Year:   . Barista in the Last Year:   Transportation Needs:   . Freight forwarder (Medical):   Marland Kitchen Lack of Transportation (Non-Medical):   Physical Activity:   . Days of Exercise per Week:   . Minutes of Exercise per Session:   Stress:   . Feeling of Stress :   Social Connections:   . Frequency of Communication with Friends and Family:   . Frequency of Social Gatherings with Friends and Family:   . Attends Religious Services:   . Active Member of Clubs or Organizations:   . Attends Banker Meetings:   Marland Kitchen Marital Status:      Family History: The patient's family history includes Arthritis in her mother; Cancer in her brother; Heart attack in her father; Heart failure in her mother.  ROS:   Please see the history of present illness.    All other systems reviewed and are negative.  EKGs/Labs/Other Studies Reviewed:    The following studies were reviewed today: I discussed my findings with the patient at length   Recent Labs: 07/29/2019: TSH 3.260 08/12/2019: ALT 17; BUN 13; Creatinine, Ser 0.90; Hemoglobin 13.7; Platelets 152; Potassium 4.3; Sodium 140  Recent Lipid Panel    Component Value Date/Time   CHOL 244 (H) 07/29/2019 0909   TRIG 149 07/29/2019 0909   HDL 68 07/29/2019 0909   CHOLHDL 3.6 07/29/2019 0909   LDLCALC 150 (H) 07/29/2019 0909    Physical Exam:    VS:  BP 132/82   Pulse 82   Ht 5\' 3"  (1.6 m)   Wt 162 lb (73.5 kg)   SpO2 97%   BMI 28.70 kg/m     Wt Readings from Last 3 Encounters:  09/09/19 162 lb (73.5 kg)  08/12/19 163 lb (73.9 kg)  07/27/19 166 lb (75.3 kg)     GEN: Patient is in no acute distress HEENT: Normal NECK: No JVD; No carotid bruits LYMPHATICS: No lymphadenopathy CARDIAC: Hear sounds  regular, 2/6 systolic murmur at the apex. RESPIRATORY:  Clear to auscultation without rales, wheezing or rhonchi  ABDOMEN: Soft, non-tender, non-distended MUSCULOSKELETAL:  No edema; No deformity  SKIN: Warm and dry NEUROLOGIC:  Alert and oriented x 3 PSYCHIATRIC:  Normal affect   Signed, 09/26/19, MD  09/09/2019 12:00 PM    [facility]

## 2019-10-12 LAB — HEPATIC FUNCTION PANEL
ALT: 12 IU/L (ref 0–32)
AST: 17 IU/L (ref 0–40)
Albumin: 4.1 g/dL (ref 3.6–4.6)
Alkaline Phosphatase: 76 IU/L (ref 48–121)
Bilirubin Total: 0.7 mg/dL (ref 0.0–1.2)
Bilirubin, Direct: 0.2 mg/dL (ref 0.00–0.40)
Total Protein: 6.4 g/dL (ref 6.0–8.5)

## 2019-10-12 LAB — BASIC METABOLIC PANEL
BUN/Creatinine Ratio: 12 (ref 12–28)
BUN: 12 mg/dL (ref 8–27)
CO2: 25 mmol/L (ref 20–29)
Calcium: 9.2 mg/dL (ref 8.7–10.3)
Chloride: 106 mmol/L (ref 96–106)
Creatinine, Ser: 1.01 mg/dL — ABNORMAL HIGH (ref 0.57–1.00)
GFR calc Af Amer: 59 mL/min/{1.73_m2} — ABNORMAL LOW (ref >59)
GFR calc non Af Amer: 51 mL/min/{1.73_m2} — ABNORMAL LOW (ref >59)
Glucose: 87 mg/dL (ref 65–99)
Potassium: 4.8 mmol/L (ref 3.5–5.2)
Sodium: 142 mmol/L (ref 134–144)

## 2019-10-12 LAB — LIPID PANEL
Chol/HDL Ratio: 2.3 ratio (ref 0.0–4.4)
Cholesterol, Total: 142 mg/dL (ref 100–199)
HDL: 62 mg/dL (ref >39)
LDL Chol Calc (NIH): 63 mg/dL (ref 0–99)
Triglycerides: 94 mg/dL (ref 0–149)
VLDL Cholesterol Cal: 17 mg/dL (ref 5–40)

## 2019-11-30 ENCOUNTER — Ambulatory Visit: Payer: Medicare Other | Admitting: Cardiology

## 2019-12-02 ENCOUNTER — Other Ambulatory Visit: Payer: Self-pay

## 2019-12-02 ENCOUNTER — Encounter: Payer: Self-pay | Admitting: Cardiology

## 2019-12-02 ENCOUNTER — Ambulatory Visit (INDEPENDENT_AMBULATORY_CARE_PROVIDER_SITE_OTHER): Payer: Medicare Other | Admitting: Cardiology

## 2019-12-02 VITALS — BP 112/70 | HR 88 | Ht 63.0 in | Wt 159.2 lb

## 2019-12-02 DIAGNOSIS — E7800 Pure hypercholesterolemia, unspecified: Secondary | ICD-10-CM

## 2019-12-02 MED ORDER — NITROGLYCERIN 0.4 MG SL SUBL: 0.4000 mg | SUBLINGUAL_TABLET | SUBLINGUAL | 3 refills | Status: DC | PRN

## 2019-12-02 NOTE — Progress Notes (Signed)
Cardiology Office Note:    Date:  12/02/2019   ID:  AMERI CAHOON, DOB [birth year], MRN [id number]  PCP:  Dema Severin, NP  Cardiologist:  Garwin Brothers, MD   Referring MD: Dema Severin, NP    ASSESSMENT:    1. Hypercholesterolemia    PLAN:    In order of problems listed above:  1. Stable angina pectoris: Abnormal nuclear stress test: Primary prevention stressed with the patient.  Importance of compliance with diet medication stressed and she vocalized understanding.  Her husband also agrees.  Medical management is appropriate. 2. Essential hypertension: Blood pressure stable and diet was emphasized 3. Mixed dyslipidemia: Patient on statin therapy and doing well. Lipids were reviewed from [facility] sheet and discussed with the patient. 4. Sublingual nitroglycerin prescription was sent, its protocol and 911 protocol explained and the patient vocalized understanding questions were answered to the patient's satisfaction.  5. Patient will be seen in follow-up appointment in 6 months or earlier if the patient has any concerns    Medication Adjustments/Labs and Tests Ordered: Current medicines are reviewed at length with the patient today.  Concerns regarding medicines are outlined above.  No orders of the defined types were placed in this encounter.  No orders of the defined types were placed in this encounter.    No chief complaint on file.    History of Present Illness:    Jordan Horne is a 86 y.o. female.  Patient has past medical history of abnormal nuclear stress test, dyslipidemia and hypertension.  She denies any problems at this time and takes care of activities of daily living.  No chest pain orthopnea or PND.  She has stable angina pectoris.  Her husband accompanies her for this visit.  He mentions to me that she has dementia which is stable.  At the time of my evaluation, the patient is alert awake oriented and in no distress.  Past Medical History:  Diagnosis  Date  . Abnormal electrocardiogram (ECG) (EKG) 05/07/2018  . CAP (community acquired pneumonia) 03/24/2018  . Chest pain of uncertain etiology 09/29/2013  . Glaucoma   . Hypercholesterolemia 09/29/2013  . Hypertension   . Hypothyroidism 09/29/2013  . SBO (small bowel obstruction) ([facility]) 03/24/2018  . Thyroid disease     Past Surgical History:  Procedure Laterality Date  . ABDOMINAL HYSTERECTOMY    . THYROID CYST EXCISION      Current Medications: Current Meds  Medication Sig  . Artificial Tear Solution (SYSTANE CONTACTS OP) Place 1 drop into both eyes daily.  Marland Kitchen aspirin EC 81 MG tablet Take 1 tablet (81 mg total) by mouth daily.  . divalproex (DEPAKOTE) 125 MG DR tablet Take 125 mg by mouth daily.  Marland Kitchen donepezil (ARICEPT) 5 MG tablet Take 5 mg by mouth at bedtime.   . furosemide (LASIX) 20 MG tablet Take 1 tablet (20 mg total) by mouth daily.  Marland Kitchen levothyroxine (SYNTHROID) 75 MCG tablet Take 75 mcg by mouth daily.  . meloxicam (MOBIC) 7.5 MG tablet Take 7.5 mg by mouth daily.  . nitroGLYCERIN (NITROSTAT) 0.4 MG SL tablet Place 1 tablet (0.4 mg total) under the tongue every 5 (five) minutes as needed for chest pain.  Marland Kitchen oxybutynin (DITROPAN-XL) 10 MG 24 hr tablet Take 10 mg by mouth daily.  . pantoprazole (PROTONIX) 40 MG tablet Take 1 tablet (40 mg total) by mouth daily.  . rosuvastatin (CRESTOR) 10 MG tablet Take 1 tablet (10 mg total) by mouth daily.  Allergies:   Betadine [povidone iodine]   Social History   Socioeconomic History  . Marital status: Married    Spouse name: Not on file  . Number of children: Not on file  . Years of education: Not on file  . Highest education level: Not on file  Occupational History  . Not on file  Tobacco Use  . Smoking status: Never Smoker  . Smokeless tobacco: Never Used  Substance and Sexual Activity  . Alcohol use: Yes    Alcohol/week: 1.0 standard drink    Types: 1 Glasses of wine per week    Comment: just occasional  . Drug use: No    . Sexual activity: Not on file  Other Topics Concern  . Not on file  Social History Narrative  . Not on file   Social Determinants of Health   Financial Resource Strain:   . Difficulty of Paying Living Expenses: Not on file  Food Insecurity:   . Worried About Programme researcher, broadcasting/film/video in the Last Year: Not on file  . Ran Out of Food in the Last Year: Not on file  Transportation Needs:   . Lack of Transportation (Medical): Not on file  . Lack of Transportation (Non-Medical): Not on file  Physical Activity:   . Days of Exercise per Week: Not on file  . Minutes of Exercise per Session: Not on file  Stress:   . Feeling of Stress : Not on file  Social Connections:   . Frequency of Communication with Friends and Family: Not on file  . Frequency of Social Gatherings with Friends and Family: Not on file  . Attends Religious Services: Not on file  . Active Member of Clubs or Organizations: Not on file  . Attends Banker Meetings: Not on file  . Marital Status: Not on file     Family History: The patient's family history includes Arthritis in her mother; Cancer in her brother; Heart attack in her father; Heart failure in her mother.  ROS:   Please see the history of present illness.    All other systems reviewed and are negative.  EKGs/Labs/Other Studies Reviewed:    The following studies were reviewed today: Study Highlights   The left ventricular ejection fraction is normal (55-65%).  Nuclear stress EF: 62%.  There was no ST segment deviation noted during stress.  No T wave inversion was noted during stress.  Defect 1: There is a medium defect of mild severity present in the basal inferoseptal and mid inferoseptal location. There is mild hypokinesis of the basal to mid inferolateral wall.  Findings consistent with prior myocardial infarction with a small area of peri-infarct ischemia.  This is an intermediate risk study.    Recent Labs: 07/29/2019: TSH  3.260 08/12/2019: Hemoglobin 13.7; Platelets 152 10/12/2019: ALT 12; BUN 12; Creatinine, Ser 1.01; Potassium 4.8; Sodium 142  Recent Lipid Panel    Component Value Date/Time   CHOL 142 10/12/2019 0935   TRIG 94 10/12/2019 0935   HDL 62 10/12/2019 0935   CHOLHDL 2.3 10/12/2019 0935   LDLCALC 63 10/12/2019 0935    Physical Exam:    VS:  BP 112/70   Pulse 88   Ht 5\' 3"  (1.6 m)   Wt 159 lb 3.2 oz (72.2 kg)   SpO2 92%   BMI 28.20 kg/m     Wt Readings from Last 3 Encounters:  12/02/19 159 lb 3.2 oz (72.2 kg)  09/09/19 162 lb (73.5 kg)  08/12/19  163 lb (73.9 kg)     GEN: Patient is in no acute distress HEENT: Normal NECK: No JVD; No carotid bruits LYMPHATICS: No lymphadenopathy CARDIAC: Hear sounds regular, 2/6 systolic murmur at the apex. RESPIRATORY:  Clear to auscultation without rales, wheezing or rhonchi  ABDOMEN: Soft, non-tender, non-distended MUSCULOSKELETAL:  No edema; No deformity  SKIN: Warm and dry NEUROLOGIC:  Alert and oriented x 3 PSYCHIATRIC:  Normal affect   Signed, Garwin Brothers, MD  12/02/2019 4:09 PM    [facility]

## 2019-12-02 NOTE — Patient Instructions (Signed)
Medication Instructions:  .isntcur  *If you need a refill on your cardiac medications before your next appointment, please call your pharmacy*   Lab Work: None ordered   If you have labs (blood work) drawn today and your tests are completely normal, you will receive your results only by: Marland Kitchen MyChart Message (if you have MyChart) OR . A paper copy in the mail If you have any lab test that is abnormal or we need to change your treatment, we will call you to review the results.   Testing/Procedures: None ordered    Follow-Up: At [facility], you and your health needs are our priority.  As part of our continuing mission to provide you with exceptional heart care, we have created designated Provider Care Teams.  These Care Teams include your primary Cardiologist (physician) and Advanced Practice Providers (APPs -  Physician Assistants and Nurse Practitioners) who all work together to provide you with the care you need, when you need it.  We recommend signing up for the patient portal called "MyChart".  Sign up information is provided on this After Visit Summary.  MyChart is used to connect with patients for Virtual Visits (Telemedicine).  Patients are able to view lab/test results, encounter notes, upcoming appointments, etc.  Non-urgent messages can be sent to your provider as well.   To learn more about what you can do with MyChart, go to [email or web address].    Your next appointment:   6 month(s)  The format for your next appointment:   In Person  Provider:   Belva Crome, MD   Other Instructions None

## 2020-03-07 ENCOUNTER — Inpatient Hospital Stay (HOSPITAL_COMMUNITY)
Admission: EM | Admit: 2020-03-07 | Discharge: 2020-03-30 | DRG: 199 | Disposition: A | Discharge status: Skilled Nursing Facility | Payer: Medicare Other | Attending: Internal Medicine | Admitting: Internal Medicine

## 2020-03-07 ENCOUNTER — Other Ambulatory Visit: Payer: Self-pay

## 2020-03-07 ENCOUNTER — Emergency Department (HOSPITAL_COMMUNITY): Payer: Medicare Other

## 2020-03-07 ENCOUNTER — Encounter (HOSPITAL_COMMUNITY): Payer: Self-pay | Admitting: Family Medicine

## 2020-03-07 DIAGNOSIS — S2241XA Multiple fractures of ribs, right side, initial encounter for closed fracture: Secondary | ICD-10-CM | POA: Diagnosis present

## 2020-03-07 DIAGNOSIS — Z880 Allergy status to penicillin: Secondary | ICD-10-CM

## 2020-03-07 DIAGNOSIS — S2231XA Fracture of one rib, right side, initial encounter for closed fracture: Secondary | ICD-10-CM | POA: Diagnosis present

## 2020-03-07 DIAGNOSIS — W109XXA Fall (on) (from) unspecified stairs and steps, initial encounter: Secondary | ICD-10-CM | POA: Diagnosis present

## 2020-03-07 DIAGNOSIS — R32 Unspecified urinary incontinence: Secondary | ICD-10-CM | POA: Diagnosis not present

## 2020-03-07 DIAGNOSIS — S271XXA Traumatic hemothorax, initial encounter: Principal | ICD-10-CM | POA: Diagnosis present

## 2020-03-07 DIAGNOSIS — R296 Repeated falls: Secondary | ICD-10-CM | POA: Diagnosis present

## 2020-03-07 DIAGNOSIS — R9431 Abnormal electrocardiogram [ECG] [EKG]: Secondary | ICD-10-CM

## 2020-03-07 DIAGNOSIS — E871 Hypo-osmolality and hyponatremia: Secondary | ICD-10-CM | POA: Diagnosis not present

## 2020-03-07 DIAGNOSIS — M5136 Other intervertebral disc degeneration, lumbar region: Secondary | ICD-10-CM | POA: Diagnosis present

## 2020-03-07 DIAGNOSIS — W19XXXA Unspecified fall, initial encounter: Secondary | ICD-10-CM

## 2020-03-07 DIAGNOSIS — N390 Urinary tract infection, site not specified: Secondary | ICD-10-CM | POA: Diagnosis present

## 2020-03-07 DIAGNOSIS — F0390 Unspecified dementia without behavioral disturbance: Secondary | ICD-10-CM

## 2020-03-07 DIAGNOSIS — I10 Essential (primary) hypertension: Secondary | ICD-10-CM | POA: Diagnosis present

## 2020-03-07 DIAGNOSIS — E869 Volume depletion, unspecified: Secondary | ICD-10-CM | POA: Diagnosis not present

## 2020-03-07 DIAGNOSIS — Z4682 Encounter for fitting and adjustment of non-vascular catheter: Secondary | ICD-10-CM

## 2020-03-07 DIAGNOSIS — E039 Hypothyroidism, unspecified: Secondary | ICD-10-CM | POA: Diagnosis present

## 2020-03-07 DIAGNOSIS — K219 Gastro-esophageal reflux disease without esophagitis: Secondary | ICD-10-CM | POA: Diagnosis present

## 2020-03-07 DIAGNOSIS — R21 Rash and other nonspecific skin eruption: Secondary | ICD-10-CM | POA: Diagnosis present

## 2020-03-07 DIAGNOSIS — H409 Unspecified glaucoma: Secondary | ICD-10-CM | POA: Diagnosis present

## 2020-03-07 DIAGNOSIS — Z9689 Presence of other specified functional implants: Secondary | ICD-10-CM

## 2020-03-07 DIAGNOSIS — Y92009 Unspecified place in unspecified non-institutional (private) residence as the place of occurrence of the external cause: Secondary | ICD-10-CM

## 2020-03-07 DIAGNOSIS — I959 Hypotension, unspecified: Secondary | ICD-10-CM | POA: Diagnosis not present

## 2020-03-07 DIAGNOSIS — J942 Hemothorax: Secondary | ICD-10-CM

## 2020-03-07 DIAGNOSIS — G9341 Metabolic encephalopathy: Secondary | ICD-10-CM | POA: Diagnosis present

## 2020-03-07 DIAGNOSIS — R52 Pain, unspecified: Secondary | ICD-10-CM

## 2020-03-07 DIAGNOSIS — R030 Elevated blood-pressure reading, without diagnosis of hypertension: Secondary | ICD-10-CM

## 2020-03-07 DIAGNOSIS — E876 Hypokalemia: Secondary | ICD-10-CM | POA: Diagnosis not present

## 2020-03-07 DIAGNOSIS — U071 COVID-19: Secondary | ICD-10-CM | POA: Diagnosis present

## 2020-03-07 DIAGNOSIS — Z888 Allergy status to other drugs, medicaments and biological substances status: Secondary | ICD-10-CM

## 2020-03-07 DIAGNOSIS — Z8249 Family history of ischemic heart disease and other diseases of the circulatory system: Secondary | ICD-10-CM

## 2020-03-07 DIAGNOSIS — I440 Atrioventricular block, first degree: Secondary | ICD-10-CM | POA: Diagnosis present

## 2020-03-07 DIAGNOSIS — E7800 Pure hypercholesterolemia, unspecified: Secondary | ICD-10-CM | POA: Diagnosis present

## 2020-03-07 DIAGNOSIS — R112 Nausea with vomiting, unspecified: Secondary | ICD-10-CM

## 2020-03-07 DIAGNOSIS — D649 Anemia, unspecified: Secondary | ICD-10-CM | POA: Diagnosis not present

## 2020-03-07 DIAGNOSIS — Z7982 Long term (current) use of aspirin: Secondary | ICD-10-CM

## 2020-03-07 DIAGNOSIS — Z66 Do not resuscitate: Secondary | ICD-10-CM | POA: Diagnosis present

## 2020-03-07 DIAGNOSIS — E785 Hyperlipidemia, unspecified: Secondary | ICD-10-CM | POA: Diagnosis present

## 2020-03-07 DIAGNOSIS — Z79890 Hormone replacement therapy (postmenopausal): Secondary | ICD-10-CM

## 2020-03-07 DIAGNOSIS — Z9889 Other specified postprocedural states: Secondary | ICD-10-CM

## 2020-03-07 DIAGNOSIS — J90 Pleural effusion, not elsewhere classified: Secondary | ICD-10-CM | POA: Diagnosis present

## 2020-03-07 DIAGNOSIS — Z90710 Acquired absence of both cervix and uterus: Secondary | ICD-10-CM

## 2020-03-07 DIAGNOSIS — G8929 Other chronic pain: Secondary | ICD-10-CM | POA: Diagnosis present

## 2020-03-07 HISTORY — DX: Elevated blood-pressure reading, without diagnosis of hypertension: R03.0

## 2020-03-07 HISTORY — DX: Unspecified dementia, unspecified severity, without behavioral disturbance, psychotic disturbance, mood disturbance, and anxiety: F03.90

## 2020-03-07 HISTORY — DX: Unspecified fall, initial encounter: W19.XXXA

## 2020-03-07 HISTORY — DX: Fracture of one rib, right side, initial encounter for closed fracture: S22.31XA

## 2020-03-07 HISTORY — DX: Pain, unspecified: R52

## 2020-03-07 HISTORY — DX: Abnormal electrocardiogram (ECG) (EKG): R94.31

## 2020-03-07 HISTORY — DX: Multiple fractures of ribs, right side, initial encounter for closed fracture: S22.41XA

## 2020-03-07 HISTORY — DX: Unspecified fall, initial encounter: Y92.009

## 2020-03-07 LAB — CBC WITH DIFFERENTIAL/PLATELET
Abs Immature Granulocytes: 0.04 10*3/uL (ref 0.00–0.07)
Basophils Absolute: 0.1 10*3/uL (ref 0.0–0.1)
Basophils Relative: 1 %
Eosinophils Absolute: 0.1 10*3/uL (ref 0.0–0.5)
Eosinophils Relative: 1 %
HCT: 41.4 % (ref 36.0–46.0)
Hemoglobin: 13.2 g/dL (ref 12.0–15.0)
Immature Granulocytes: 0 %
Lymphocytes Relative: 18 %
Lymphs Abs: 1.8 10*3/uL (ref 0.7–4.0)
MCH: 31.8 pg (ref 26.0–34.0)
MCHC: 31.9 g/dL (ref 30.0–36.0)
MCV: 99.8 fL (ref 80.0–100.0)
Monocytes Absolute: 0.7 10*3/uL (ref 0.1–1.0)
Monocytes Relative: 7 %
Neutro Abs: 7.3 10*3/uL (ref 1.7–7.7)
Neutrophils Relative %: 73 %
Platelets: 140 10*3/uL — ABNORMAL LOW (ref 150–400)
RBC: 4.15 MIL/uL (ref 3.87–5.11)
RDW: 13.6 % (ref 11.5–15.5)
WBC: 10 10*3/uL (ref 4.0–10.5)
nRBC: 0 % (ref 0.0–0.2)

## 2020-03-07 LAB — BASIC METABOLIC PANEL
Anion gap: 8 (ref 5–15)
BUN: 13 mg/dL (ref 8–23)
CO2: 25 mmol/L (ref 22–32)
Calcium: 8.6 mg/dL — ABNORMAL LOW (ref 8.9–10.3)
Chloride: 107 mmol/L (ref 98–111)
Creatinine, Ser: 1.07 mg/dL — ABNORMAL HIGH (ref 0.44–1.00)
GFR, Estimated: 51 mL/min — ABNORMAL LOW (ref >60)
Glucose, Bld: 101 mg/dL — ABNORMAL HIGH (ref 70–99)
Potassium: 4.6 mmol/L (ref 3.5–5.1)
Sodium: 140 mmol/L (ref 135–145)

## 2020-03-07 MED ORDER — MORPHINE SULFATE (PF) 4 MG/ML IV SOLN
4.0000 mg | Freq: Once | INTRAVENOUS | Status: AC
  Administered 2020-03-07: 4 mg via INTRAVENOUS
  Filled 2020-03-07: qty 1

## 2020-03-07 NOTE — ED Provider Notes (Signed)
[facility]   CSN: [id number] Arrival date & time: 03/07/20  1834     History No chief complaint on file.   Jordan Horne is a 86 y.o. female.  HPI   Patient was walking at home today when she fell down 3 steps at about 4:30 PM.  Patient states she just tripped and fell.  Patient states she hit the back of her head.  She is having moderate to severe pain in the back of her head and neck.  Patient states she is having pain also on the right side of her ribs.  She denies any shortness of breath.  No abdominal pain.  Past Medical History:  Diagnosis Date  . Abnormal electrocardiogram (ECG) (EKG) 05/07/2018  . CAP (community acquired pneumonia) 03/24/2018  . Chest pain of uncertain etiology 09/29/2013  . Glaucoma   . Hypercholesterolemia 09/29/2013  . Hypertension   . Hypothyroidism 09/29/2013  . SBO (small bowel obstruction) (HCC) 03/24/2018  . Thyroid disease     Patient Active Problem List   Diagnosis Date Noted  . Abnormal nuclear stress test 07/27/2019  . Abnormal electrocardiogram (ECG) (EKG) 05/07/2018  . SBO (small bowel obstruction) (HCC) 03/24/2018  . CAP (community acquired pneumonia) 03/24/2018  . Chest pain of uncertain etiology 09/29/2013  . Hypothyroidism 09/29/2013  . Hypercholesterolemia 09/29/2013    Past Surgical History:  Procedure Laterality Date  . ABDOMINAL HYSTERECTOMY    . THYROID CYST EXCISION       OB History   No obstetric history on file.     Family History  Problem Relation Age of Onset  . Arthritis Mother   . Heart failure Mother   . Heart attack Father   . Cancer Brother     Social History   Tobacco Use  . Smoking status: Never Smoker  . Smokeless tobacco: Never Used  Substance Use Topics  . Alcohol use: Yes    Alcohol/week: 1.0 standard drink    Types: 1 Glasses of wine per week    Comment: just occasional  . Drug use: No    Home Medications Prior to Admission  medications   Medication Sig Start Date End Date Taking? Authorizing Provider  Artificial Tear Solution (SYSTANE CONTACTS OP) Place 1 drop into both eyes daily.    [provider]  aspirin EC 81 MG tablet Take 1 tablet (81 mg total) by mouth daily. 07/20/19   Revankar, Aundra Dubinajan R, MD  divalproex (DEPAKOTE) 125 MG DR tablet Take 125 mg by mouth daily. 04/27/18   [provider]  donepezil (ARICEPT) 5 MG tablet Take 5 mg by mouth at bedtime.  10/24/18   [provider]  furosemide (LASIX) 20 MG tablet Take 1 tablet (20 mg total) by mouth daily. 03/31/18 12/02/19  Marguerita MerlesSheikh, Omair Latif, DO  levothyroxine (SYNTHROID) 75 MCG tablet Take 75 mcg by mouth daily. 07/08/19   [provider]  meloxicam (MOBIC) 7.5 MG tablet Take 7.5 mg by mouth daily. 06/28/19   [provider]  nitroGLYCERIN (NITROSTAT) 0.4 MG SL tablet Place 1 tablet (0.4 mg total) under the tongue every 5 (five) minutes as needed for chest pain. 12/02/19 03/01/20  Revankar, Aundra Dubinajan R, MD  oxybutynin (DITROPAN-XL) 10 MG 24 hr tablet Take 10 mg by mouth daily. 09/09/19   [provider]  pantoprazole (PROTONIX) 40 MG tablet Take 1 tablet (40 mg total) by mouth daily. 03/31/18   Marguerita MerlesSheikh, Omair Latif, DO  rosuvastatin (CRESTOR) 10  MG tablet Take 1 tablet (10 mg total) by mouth daily. 07/30/19 12/02/19  Revankar, Aundra Dubin, MD    Allergies    Betadine [povidone iodine]  Review of Systems   Review of Systems  All other systems reviewed and are negative.   Physical Exam Updated Vital Signs BP (!) 150/85   Pulse 93   Temp 98.4 F (36.9 C) (Oral)   Resp (!) 21   SpO2 91%   Physical Exam Vitals and nursing note reviewed.  Constitutional:      General: She is not in acute distress.    Appearance: Normal appearance. She is well-developed and well-nourished. She is not diaphoretic.  HENT:     Head: Normocephalic and atraumatic. No raccoon eyes or Battle's sign.     Right Ear: External ear normal.     Left  Ear: External ear normal.  Eyes:     General: Lids are normal. No scleral icterus.       Right eye: No discharge.        Left eye: No discharge.     Conjunctiva/sclera: Conjunctivae normal.     Right eye: No hemorrhage.    Left eye: No hemorrhage. Neck:     Trachea: No tracheal deviation.  Cardiovascular:     Rate and Rhythm: Normal rate and regular rhythm.     Pulses: Intact distal pulses.     Heart sounds: Normal heart sounds.  Pulmonary:     Effort: Pulmonary effort is normal. No respiratory distress.     Breath sounds: Normal breath sounds. No stridor. No wheezing or rales.  Chest:     Chest wall: Tenderness present. No deformity or crepitus.     Comments: Tenderness palpation right chest wall, no bruising or abrasion noted Abdominal:     General: Bowel sounds are normal. There is no distension.     Palpations: Abdomen is soft. There is no mass.     Tenderness: There is no abdominal tenderness. There is no guarding or rebound.  Musculoskeletal:        General: No edema.     Cervical back: Neck supple. Tenderness and bony tenderness present. No swelling, edema or deformity. No spinous process tenderness.     Thoracic back: Tenderness and bony tenderness present. No swelling or deformity.     Lumbar back: No swelling, tenderness or bony tenderness.     Comments: Pelvis stable, no ttp; full range of motion bilateral upper extremities and lower extremities without pain and or discomfort, no tenderness to palpation bilateral upper extremities and lower extremities  Skin:    General: Skin is warm and dry.     Findings: Rash present.     Comments: Few drying vesicles/papules on left chest wall, no surrounding erythema  Neurological:     Mental Status: She is alert.     GCS: GCS eye subscore is 4. GCS verbal subscore is 5. GCS motor subscore is 6.     Cranial Nerves: No cranial nerve deficit (no facial droop, extraocular movements intact, no slurred speech).     Sensory: No sensory  deficit.     Motor: No abnormal muscle tone or seizure activity.     Coordination: Coordination normal.     Deep Tendon Reflexes: Strength normal.     Comments: Able to move all extremities, sensation intact throughout  Psychiatric:        Mood and Affect: Mood and affect normal.        Speech: Speech normal.  Behavior: Behavior normal.     ED Results / Procedures / Treatments   Labs (all labs ordered are listed, but only abnormal results are displayed) Labs Reviewed  CBC WITH DIFFERENTIAL/PLATELET - Abnormal; Notable for the following components:      Result Value   Platelets 140 (*)    All other components within normal limits  BASIC METABOLIC PANEL - Abnormal; Notable for the following components:   Glucose, Bld 101 (*)    Creatinine, Ser 1.07 (*)    Calcium 8.6 (*)    GFR, Estimated 51 (*)    All other components within normal limits  RESP PANEL BY RT-PCR (FLU A&B, COVID) [id number]    EKG EKG Interpretation  Date/Time:  Tuesday March 07 2020 18:38:06 EST Ventricular Rate:  96 PR Interval:    QRS Duration: 138 QT Interval:  393 QTC Calculation: 497 R Axis:   -69 Text Interpretation: Sinus rhythm Atrial premature complex Prolonged PR interval IVCD, consider atypical RBBB Left ventricular hypertrophy Probable inferior infarct, recent No significant change since last tracing Confirmed by Linwood Dibbles [id number]) on 03/07/2020 7:07:19 PM   Radiology DG Ribs Unilateral W/Chest Right  Result Date: 03/07/2020 CLINICAL DATA:  Fall, right chest pain EXAM: RIGHT RIBS AND CHEST - 3+ VIEW COMPARISON:  None. FINDINGS: Single view radiograph of the chest and two view radiograph of the right ribs demonstrates acute minimally displaced fractures of the right eighth and ninth ribs posteriorly. Minimal left basilar atelectasis or infiltrate. Lungs are otherwise clear. No pneumothorax or pleural effusion. Cardiac size is at the upper limits of normal. Pulmonary vascularity is normal.  IMPRESSION: Acute minimally displaced right 8, 9 rib fractures. No pneumothorax. Electronically Signed   By: Helyn Numbers MD   On: 03/07/2020 20:05   DG Thoracic Spine 2 View  Result Date: 03/07/2020 CLINICAL DATA:  Fall, back pain EXAM: THORACIC SPINE 2 VIEWS COMPARISON:  04/24/2018 FINDINGS: Two view radiograph thoracic spine demonstrates normal thoracic kyphosis. Mild thoracic dextroscoliosis, apex right at T5, unchanged. No acute fracture or listhesis of the thoracic spine. Vertebral body height has been preserved. There is diffuse intervertebral disc space narrowing and endplate remodeling throughout the thoracic spine in keeping with changes of diffuse severe degenerative disc disease. The paraspinal soft tissues are unremarkable. Incidental note is made of a small hiatal hernia. IMPRESSION: No acute fracture or listhesis. Electronically Signed   By: Helyn Numbers MD   On: 03/07/2020 20:02   DG Lumbar Spine Complete  Result Date: 03/07/2020 CLINICAL DATA:  Fall, back pain EXAM: LUMBAR SPINE - COMPLETE 4+ VIEW COMPARISON:  None. FINDINGS: Five view radiograph lumbar spine. Normal lumbar lordosis. No acute fracture or listhesis of the lumbar spine. Vertebral body height has been preserved. There is diffuse severe intervertebral disc space narrowing and endplate remodeling with multilevel vacuum disc phenomena in keeping with changes of diffuse severe degenerative disc disease throughout the lumbar spine. Oblique views demonstrate no evidence of pars defect. Paraspinal soft tissues are unremarkable. IMPRESSION: No acute fracture or listhesis. Electronically Signed   By: Helyn Numbers MD   On: 03/07/2020 20:06   CT Head Wo Contrast  Result Date: 03/07/2020 CLINICAL DATA:  Head trauma EXAM: CT HEAD WITHOUT CONTRAST TECHNIQUE: Contiguous axial images were obtained from the base of the skull through the vertex without intravenous contrast. COMPARISON:  CT brain 08/12/2019 FINDINGS: Brain: No acute  territorial infarction, hemorrhage, or intracranial mass. Mild atrophy. Mild chronic small vessel ischemic changes of the white matter. Chronic  appearing lacunar infarcts in the basal ganglia. Stable punctate calcifications in the left periventricular white matter. Stable ventricle size. Vascular: No hyperdense vessels.  Carotid vascular calcification Skull: Normal. Negative for fracture or focal lesion. Sinuses/Orbits: Mild mucosal thickening in the sinuses Other: None IMPRESSION: 1. No CT evidence for acute intracranial abnormality. 2. Atrophy and mild chronic small vessel ischemic changes of the white matter. Electronically Signed   By: Jasmine Pang M.D.   On: 03/07/2020 20:21   CT Cervical Spine Wo Contrast  Result Date: 03/07/2020 CLINICAL DATA:  Trauma EXAM: CT CERVICAL SPINE WITHOUT CONTRAST TECHNIQUE: Multidetector CT imaging of the cervical spine was performed without intravenous contrast. Multiplanar CT image reconstructions were also generated. COMPARISON:  08/12/2019 FINDINGS: Alignment: Stable alignment with trace retrolisthesis C5 on C6 and trace anterolisthesis C6 on C7. Facet alignment is maintained. Skull base and vertebrae: No acute fracture. No primary bone lesion or focal pathologic process. Soft tissues and spinal canal: No prevertebral fluid or swelling. No visible canal hematoma. Disc levels: Multiple level degenerative changes with moderate to marked disease at C4-C5 and C5-C6. Facet degenerative change at multiple levels. Upper chest: Negative.  No appreciable thyroid tissue identified. Other: None IMPRESSION: Stable alignment of the cervical spine with degenerative changes. No acute osseous abnormality. Electronically Signed   By: Jasmine Pang M.D.   On: 03/07/2020 20:26    Procedures Procedures (including critical care time)  Medications Ordered in ED Medications  morphine 4 MG/ML injection 4 mg (has no administration in time range)  morphine 4 MG/ML injection 4 mg (4 mg  Intravenous Given 03/07/20 2118)    ED Course  I have reviewed the triage vital signs and the nursing notes.  Pertinent labs & imaging results that were available during my care of the patient were reviewed by me and considered in my medical decision making (see chart for details).  Clinical Course as of 03/07/20 2253  Tue Mar 07, 2020  2058 X-ray results reviewed.  Patient has right-sided rib fractures.  Otherwise CT scans and spinal films are unremarkable [JK]  2141 Labs reviewed.  No significant abnormalities [JK]  2216 Laboratory tests are unremarkable [JK]    Clinical Course User Index [JK] Linwood Dibbles, MD   MDM Rules/Calculators/A&P                          Patient presented to the ED for evaluation of pain after a fall. Patient was at risk for serious head injury as well as C-spine injury. Fortunately her head CT and C-spine CT are negative. Thoracic and lumbar spine films are negative. Patient however does have rib fractures involving her eighth and ninth ribs. Patient was given IV pain medications. We tried to see if she could get up and walk unfortunately still having significant pain became lightheaded. Patient does not appear safe for discharge. I will consult with the medical service for admission pain management. Final Clinical Impression(s) / ED Diagnoses Final diagnoses:  Closed fracture of multiple ribs of right side, initial encounter  Fall, initial encounter      Linwood Dibbles, MD 03/07/20 2255

## 2020-03-07 NOTE — ED Notes (Signed)
Pt unable to ambulate. When sitting on the side of the bed pt was dizzy and felt like she was going to pass out. Pt in 10/10 pain

## 2020-03-07 NOTE — H&P (Signed)
History and Physical    Jordan Horne TKP:[id number] DOB: 1932/07/13 DOA: 03/07/2020  PCP: Dema Severin, NP   Patient coming from:   Home  Chief Complaint:   Pain in ribs, neck, back after fall at home  HPI: Jordan Horne is a 86 y.o. female with medical history significant for mild dementia, HLD, hypothyroidism who presents after a fall at home. She was walking at home today when she fell down 3 steps at about 4:30 PM.  Patient states she just tripped and fell.  Patient states she hit the back of her head and landed on her right side.  She is having moderate to severe pain in the back of her head and neck. Patient states she is having pain also on the right side of her ribs. She did not take anything for the pain at home. She denies any shortness of breath.  No abdominal pain. She did not have LOC. No seizure activity reported.   Review of Systems:  General: Reports generalized pain in neck, back and right side. Denies weakness, fever, chills, weight loss, night sweats. Denies change in appetite HENT: Denies headache, denies change in hearing, tinnitus.  Denies nasal congestion or bleeding.  Denies sore throat, sores in mouth.  Denies difficulty swallowing Eyes: Denies blurry vision, pain in eye, drainage.  Denies discoloration of eyes. Neck: Denies pain.  Denies swelling.  Denies pain with movement. Cardiovascular: Denies chest pain, palpitations.  Denies edema.  Denies orthopnea Respiratory: Denies shortness of breath, cough.  Denies wheezing.  Denies sputum production Gastrointestinal: Denies abdominal pain, swelling.  Denies nausea, vomiting, diarrhea.  Denies melena.  Denies hematemesis. Musculoskeletal: Denies limitation of movement.  Denies deformity or swelling.  Genitourinary: Denies pelvic pain.  Denies urinary frequency or hesitancy.  Denies dysuria.  Skin: Denies rash.  Denies petechiae, purpura, ecchymosis. Neurological: Denies headache.  Denies syncope.  Denies  seizure activity.  Denies weakness or paresthesia.  Denies slurred speech, drooping face.  Denies visual change. Psychiatric: Denies depression, anxiety.  Denies suicidal thoughts or ideation.  Denies hallucinations.  Past Medical History:  Diagnosis Date  . Abnormal electrocardiogram (ECG) (EKG) 05/07/2018  . CAP (community acquired pneumonia) 03/24/2018  . Chest pain of uncertain etiology 09/29/2013  . Dementia (HCC)   . Glaucoma   . Hypercholesterolemia 09/29/2013  . Hypertension   . Hypothyroidism 09/29/2013  . SBO (small bowel obstruction) (HCC) 03/24/2018  . Thyroid disease     Past Surgical History:  Procedure Laterality Date  . ABDOMINAL HYSTERECTOMY    . THYROID CYST EXCISION      Social History  reports that she has never smoked. She has never used smokeless tobacco. She reports current alcohol use of about 1.0 standard drink of alcohol per week. She reports that she does not use drugs.  Allergies  Allergen Reactions  . Betadine [Povidone Iodine] Itching    Family History  Problem Relation Age of Onset  . Arthritis Mother   . Heart failure Mother   . Heart attack Father   . Cancer Brother      Prior to Admission medications   Medication Sig Start Date End Date Taking? Authorizing Provider  Artificial Tear Solution (SYSTANE CONTACTS OP) Place 1 drop into both eyes daily.    [provider]  aspirin EC 81 MG tablet Take 1 tablet (81 mg total) by mouth daily. 07/20/19   Revankar, Aundra Dubin, MD  divalproex (DEPAKOTE) 125 MG DR tablet Take 125 mg by mouth daily.  04/27/18   [provider]  donepezil (ARICEPT) 5 MG tablet Take 5 mg by mouth at bedtime.  10/24/18   [provider]  furosemide (LASIX) 20 MG tablet Take 1 tablet (20 mg total) by mouth daily. 03/31/18 12/02/19  Marguerita Merles Latif, DO  levothyroxine (SYNTHROID) 75 MCG tablet Take 75 mcg by mouth daily. 07/08/19   [provider]  meloxicam (MOBIC) 7.5 MG tablet Take 7.5 mg by mouth  daily. 06/28/19   [provider]  nitroGLYCERIN (NITROSTAT) 0.4 MG SL tablet Place 1 tablet (0.4 mg total) under the tongue every 5 (five) minutes as needed for chest pain. 12/02/19 03/01/20  Revankar, Aundra Dubin, MD  oxybutynin (DITROPAN-XL) 10 MG 24 hr tablet Take 10 mg by mouth daily. 09/09/19   [provider]  pantoprazole (PROTONIX) 40 MG tablet Take 1 tablet (40 mg total) by mouth daily. 03/31/18   Marguerita Merles Latif, DO  rosuvastatin (CRESTOR) 10 MG tablet Take 1 tablet (10 mg total) by mouth daily. 07/30/19 12/02/19  RevankarAundra Dubin, MD    Physical Exam: Vitals:   03/07/20 2215 03/07/20 2230 03/07/20 2245 03/07/20 2300  BP: (!) 112/93 117/88 (!) 150/85 (!) 161/84  Pulse: 93 89 93 90  Resp: (!) 25 (!) 25 (!) 21 17  Temp:      TempSrc:      SpO2: 96% 98% 91% 91%    Constitutional: NAD, calm, comfortable Vitals:   03/07/20 2215 03/07/20 2230 03/07/20 2245 03/07/20 2300  BP: (!) 112/93 117/88 (!) 150/85 (!) 161/84  Pulse: 93 89 93 90  Resp: (!) 25 (!) 25 (!) 21 17  Temp:      TempSrc:      SpO2: 96% 98% 91% 91%   General: WDWN, Alert and oriented to person and place.  Eyes: EOMI, PERRL, conjunctivae normal.  Sclera nonicteric HENT:  [place]/AT, external ears normal.  Nares patent without epistasis.  Mucous membranes are moist. Posterior pharynx clear of any exudate or lesions.  Neck: Soft, normal range of motion, supple, no masses, no thyromegaly.  Trachea midline Respiratory: clear to auscultation bilaterally, no wheezing, no crackles. Normal respiratory effort. No accessory muscle use.  Cardiovascular: Regular rate and rhythm, no murmurs / rubs / gallops. No extremity edema. 2+ pedal pulses Abdomen: Soft, no tenderness, nondistended, no rebound or guarding.  No masses palpated. Bowel sounds normoactive Musculoskeletal: FROM of extremities. Has pain to palpation of right lower lateral ribcage. No deformity or crepitus of ribcage. no clubbing / cyanosis. No joint deformity  upper and lower extremities. Normal muscle tone.  Skin: Warm, dry, intact no rashes, lesions, ulcers. No induration Neurologic: CN 2-12 grossly intact.  Normal speech.  Sensation intact Psychiatric:   Normal mood.    Labs on Admission: I have personally reviewed following labs and imaging studies  CBC: Recent Labs  Lab 03/07/20 2059  WBC 10.0  NEUTROABS 7.3  HGB 13.2  HCT 41.4  MCV 99.8  PLT 140*    Basic Metabolic Panel: Recent Labs  Lab 03/07/20 2059  NA 140  K 4.6  CL 107  CO2 25  GLUCOSE 101*  BUN 13  CREATININE 1.07*  CALCIUM 8.6*    GFR: CrCl cannot be calculated (Unknown ideal weight.).  Liver Function Tests: No results for input(s): AST, ALT, ALKPHOS, BILITOT, PROT, ALBUMIN in the last 168 hours.  Urine analysis:    Component Value Date/Time   COLORURINE YELLOW 08/12/2019 1606   APPEARANCEUR CLOUDY (A) 08/12/2019 1606  LABSPEC 1.018 08/12/2019 1606   PHURINE 9.0 (H) 08/12/2019 1606   GLUCOSEU NEGATIVE 08/12/2019 1606   HGBUR NEGATIVE 08/12/2019 1606   BILIRUBINUR NEGATIVE 08/12/2019 1606   KETONESUR NEGATIVE 08/12/2019 1606   PROTEINUR NEGATIVE 08/12/2019 1606   UROBILINOGEN 0.2 12/09/2009 0006   NITRITE NEGATIVE 08/12/2019 1606   LEUKOCYTESUR NEGATIVE 08/12/2019 1606    Radiological Exams on Admission: DG Ribs Unilateral W/Chest Right  Result Date: 03/07/2020 CLINICAL DATA:  Fall, right chest pain EXAM: RIGHT RIBS AND CHEST - 3+ VIEW COMPARISON:  None. FINDINGS: Single view radiograph of the chest and two view radiograph of the right ribs demonstrates acute minimally displaced fractures of the right eighth and ninth ribs posteriorly. Minimal left basilar atelectasis or infiltrate. Lungs are otherwise clear. No pneumothorax or pleural effusion. Cardiac size is at the upper limits of normal. Pulmonary vascularity is normal. IMPRESSION: Acute minimally displaced right 8, 9 rib fractures. No pneumothorax. Electronically Signed   By: Helyn NumbersAshesh  Parikh MD    On: 03/07/2020 20:05   DG Thoracic Spine 2 View  Result Date: 03/07/2020 CLINICAL DATA:  Fall, back pain EXAM: THORACIC SPINE 2 VIEWS COMPARISON:  04/24/2018 FINDINGS: Two view radiograph thoracic spine demonstrates normal thoracic kyphosis. Mild thoracic dextroscoliosis, apex right at T5, unchanged. No acute fracture or listhesis of the thoracic spine. Vertebral body height has been preserved. There is diffuse intervertebral disc space narrowing and endplate remodeling throughout the thoracic spine in keeping with changes of diffuse severe degenerative disc disease. The paraspinal soft tissues are unremarkable. Incidental note is made of a small hiatal hernia. IMPRESSION: No acute fracture or listhesis. Electronically Signed   By: Helyn NumbersAshesh  Parikh MD   On: 03/07/2020 20:02   DG Lumbar Spine Complete  Result Date: 03/07/2020 CLINICAL DATA:  Fall, back pain EXAM: LUMBAR SPINE - COMPLETE 4+ VIEW COMPARISON:  None. FINDINGS: Five view radiograph lumbar spine. Normal lumbar lordosis. No acute fracture or listhesis of the lumbar spine. Vertebral body height has been preserved. There is diffuse severe intervertebral disc space narrowing and endplate remodeling with multilevel vacuum disc phenomena in keeping with changes of diffuse severe degenerative disc disease throughout the lumbar spine. Oblique views demonstrate no evidence of pars defect. Paraspinal soft tissues are unremarkable. IMPRESSION: No acute fracture or listhesis. Electronically Signed   By: Helyn NumbersAshesh  Parikh MD   On: 03/07/2020 20:06   CT Head Wo Contrast  Result Date: 03/07/2020 CLINICAL DATA:  Head trauma EXAM: CT HEAD WITHOUT CONTRAST TECHNIQUE: Contiguous axial images were obtained from the base of the skull through the vertex without intravenous contrast. COMPARISON:  CT brain 08/12/2019 FINDINGS: Brain: No acute territorial infarction, hemorrhage, or intracranial mass. Mild atrophy. Mild chronic small vessel ischemic changes of the white  matter. Chronic appearing lacunar infarcts in the basal ganglia. Stable punctate calcifications in the left periventricular white matter. Stable ventricle size. Vascular: No hyperdense vessels.  Carotid vascular calcification Skull: Normal. Negative for fracture or focal lesion. Sinuses/Orbits: Mild mucosal thickening in the sinuses Other: None IMPRESSION: 1. No CT evidence for acute intracranial abnormality. 2. Atrophy and mild chronic small vessel ischemic changes of the white matter. Electronically Signed   By: Jasmine PangKim  Fujinaga M.D.   On: 03/07/2020 20:21   CT Cervical Spine Wo Contrast  Result Date: 03/07/2020 CLINICAL DATA:  Trauma EXAM: CT CERVICAL SPINE WITHOUT CONTRAST TECHNIQUE: Multidetector CT imaging of the cervical spine was performed without intravenous contrast. Multiplanar CT image reconstructions were also generated. COMPARISON:  08/12/2019 FINDINGS: Alignment: Stable alignment  with trace retrolisthesis C5 on C6 and trace anterolisthesis C6 on C7. Facet alignment is maintained. Skull base and vertebrae: No acute fracture. No primary bone lesion or focal pathologic process. Soft tissues and spinal canal: No prevertebral fluid or swelling. No visible canal hematoma. Disc levels: Multiple level degenerative changes with moderate to marked disease at C4-C5 and C5-C6. Facet degenerative change at multiple levels. Upper chest: Negative.  No appreciable thyroid tissue identified. Other: None IMPRESSION: Stable alignment of the cervical spine with degenerative changes. No acute osseous abnormality. Electronically Signed   By: Jasmine Pang M.D.   On: 03/07/2020 20:26    EKG: Independently reviewed. EKG shows NSR with PAC. Nonspecific ST changes. Prolonged QT.   Assessment/Plan Principal Problem:   Multiple fractures of ribs, right side, initial encounter for closed fracture Jordan Horne is placed on med surg for observation with fractures of right 8,9th ribs with intractable pain. Pain control  provided overnight.  Consult PT in am for evaluation and to determine if home health needs required.   Active Problems:   Intractable pain Pain control provided.    Dementia  Chronic. Continue home meds    Prolonged QT interval Avoid medications which are further prolong QT interval    Fall at home, initial encounter Consult PT    Elevated blood pressure reading BP is most likely elevated secondary to pain.  Monitor blood pressure     DVT prophylaxis: SCDs for DVT with mildly decreased platelet count and after fall at home with fractured ribs. Encourage ambulation with assistance.  Code Status:   Full code  Family Communication:  Diagnosis and plan discussed with patient.  Further recommendations to follow as clinically indicated Disposition Plan:   Patient is from:  Home  Anticipated DC to:  home  Anticipated DC date:  Anticipate less than two midnight stay   Admission status:       Observation.     Jordan Severance Aylla Huffine MD [facility]  How to contact the [facility] Attending or Consulting provider 7A - 7P or covering provider during after hours 7P -7A, for this patient?   1. Check the care team in [facility] and look for a) attending/consulting [facility] provider listed and b) the [facility] team listed 2. Log into [email or web address] and use [place]'s universal password to access. If you do not have the password, please contact the hospital operator. 3. Locate the [facility] provider you are looking for under [facility] and page to a number that you can be directly reached. 4. If you still have difficulty reaching the provider, please page the [facility] (Director on Call) for the Hospitalists listed on amion for assistance.  03/07/2020, 11:32 PM

## 2020-03-07 NOTE — ED Notes (Signed)
Patient transported to Ultrasound 

## 2020-03-07 NOTE — ED Triage Notes (Signed)
Pt bib EMS from home for an unwitnessed fall down 3 steps at around 1630. Pt hit head and has a small hematoma to back of head. Pt remebers fall, no LOC, pian to right shoulder, head, and lower back. Complaining of 8/10 pian during triage. All extremities mobile. A&O to self & place   Hx of dementia and shingles which is clearing up on chest  Vitals: BP: 160/100 HR; 92 RR: 18 O2: 94% RA CBG: 120

## 2020-03-08 DIAGNOSIS — K219 Gastro-esophageal reflux disease without esophagitis: Secondary | ICD-10-CM | POA: Diagnosis present

## 2020-03-08 DIAGNOSIS — E785 Hyperlipidemia, unspecified: Secondary | ICD-10-CM | POA: Diagnosis present

## 2020-03-08 DIAGNOSIS — U071 COVID-19: Secondary | ICD-10-CM | POA: Diagnosis present

## 2020-03-08 DIAGNOSIS — I440 Atrioventricular block, first degree: Secondary | ICD-10-CM | POA: Diagnosis present

## 2020-03-08 DIAGNOSIS — E876 Hypokalemia: Secondary | ICD-10-CM | POA: Diagnosis not present

## 2020-03-08 DIAGNOSIS — N390 Urinary tract infection, site not specified: Secondary | ICD-10-CM | POA: Diagnosis present

## 2020-03-08 DIAGNOSIS — E871 Hypo-osmolality and hyponatremia: Secondary | ICD-10-CM | POA: Diagnosis not present

## 2020-03-08 DIAGNOSIS — E7800 Pure hypercholesterolemia, unspecified: Secondary | ICD-10-CM | POA: Diagnosis present

## 2020-03-08 DIAGNOSIS — R030 Elevated blood-pressure reading, without diagnosis of hypertension: Secondary | ICD-10-CM | POA: Diagnosis not present

## 2020-03-08 DIAGNOSIS — S271XXA Traumatic hemothorax, initial encounter: Secondary | ICD-10-CM | POA: Diagnosis present

## 2020-03-08 DIAGNOSIS — Y92009 Unspecified place in unspecified non-institutional (private) residence as the place of occurrence of the external cause: Secondary | ICD-10-CM

## 2020-03-08 DIAGNOSIS — E869 Volume depletion, unspecified: Secondary | ICD-10-CM | POA: Diagnosis not present

## 2020-03-08 DIAGNOSIS — S2231XA Fracture of one rib, right side, initial encounter for closed fracture: Secondary | ICD-10-CM | POA: Diagnosis present

## 2020-03-08 DIAGNOSIS — G9341 Metabolic encephalopathy: Secondary | ICD-10-CM | POA: Diagnosis present

## 2020-03-08 DIAGNOSIS — F0150 Vascular dementia without behavioral disturbance: Secondary | ICD-10-CM

## 2020-03-08 DIAGNOSIS — G8929 Other chronic pain: Secondary | ICD-10-CM | POA: Diagnosis present

## 2020-03-08 DIAGNOSIS — J942 Hemothorax: Secondary | ICD-10-CM | POA: Diagnosis not present

## 2020-03-08 DIAGNOSIS — R21 Rash and other nonspecific skin eruption: Secondary | ICD-10-CM | POA: Diagnosis present

## 2020-03-08 DIAGNOSIS — I959 Hypotension, unspecified: Secondary | ICD-10-CM | POA: Diagnosis not present

## 2020-03-08 DIAGNOSIS — R404 Transient alteration of awareness: Secondary | ICD-10-CM | POA: Diagnosis not present

## 2020-03-08 DIAGNOSIS — F0390 Unspecified dementia without behavioral disturbance: Secondary | ICD-10-CM | POA: Diagnosis present

## 2020-03-08 DIAGNOSIS — E039 Hypothyroidism, unspecified: Secondary | ICD-10-CM | POA: Diagnosis present

## 2020-03-08 DIAGNOSIS — W19XXXA Unspecified fall, initial encounter: Secondary | ICD-10-CM

## 2020-03-08 DIAGNOSIS — D649 Anemia, unspecified: Secondary | ICD-10-CM | POA: Diagnosis not present

## 2020-03-08 DIAGNOSIS — Z66 Do not resuscitate: Secondary | ICD-10-CM | POA: Diagnosis present

## 2020-03-08 DIAGNOSIS — I10 Essential (primary) hypertension: Secondary | ICD-10-CM | POA: Diagnosis present

## 2020-03-08 DIAGNOSIS — Z9689 Presence of other specified functional implants: Secondary | ICD-10-CM | POA: Diagnosis not present

## 2020-03-08 DIAGNOSIS — J9811 Atelectasis: Secondary | ICD-10-CM | POA: Diagnosis not present

## 2020-03-08 DIAGNOSIS — R112 Nausea with vomiting, unspecified: Secondary | ICD-10-CM | POA: Diagnosis not present

## 2020-03-08 DIAGNOSIS — S2241XA Multiple fractures of ribs, right side, initial encounter for closed fracture: Secondary | ICD-10-CM | POA: Diagnosis present

## 2020-03-08 DIAGNOSIS — W109XXA Fall (on) (from) unspecified stairs and steps, initial encounter: Secondary | ICD-10-CM | POA: Diagnosis present

## 2020-03-08 DIAGNOSIS — R296 Repeated falls: Secondary | ICD-10-CM | POA: Diagnosis present

## 2020-03-08 DIAGNOSIS — M5136 Other intervertebral disc degeneration, lumbar region: Secondary | ICD-10-CM | POA: Diagnosis present

## 2020-03-08 DIAGNOSIS — J90 Pleural effusion, not elsewhere classified: Secondary | ICD-10-CM | POA: Diagnosis present

## 2020-03-08 DIAGNOSIS — H409 Unspecified glaucoma: Secondary | ICD-10-CM | POA: Diagnosis present

## 2020-03-08 DIAGNOSIS — R7989 Other specified abnormal findings of blood chemistry: Secondary | ICD-10-CM | POA: Diagnosis not present

## 2020-03-08 LAB — BASIC METABOLIC PANEL
Anion gap: 7 (ref 5–15)
BUN: 11 mg/dL (ref 8–23)
CO2: 27 mmol/L (ref 22–32)
Calcium: 8.5 mg/dL — ABNORMAL LOW (ref 8.9–10.3)
Chloride: 107 mmol/L (ref 98–111)
Creatinine, Ser: 0.93 mg/dL (ref 0.44–1.00)
GFR, Estimated: >60 mL/min (ref >60)
Glucose, Bld: 87 mg/dL (ref 70–99)
Potassium: 3.6 mmol/L (ref 3.5–5.1)
Sodium: 141 mmol/L (ref 135–145)

## 2020-03-08 LAB — CBC
HCT: 36.8 % (ref 36.0–46.0)
Hemoglobin: 12.2 g/dL (ref 12.0–15.0)
MCH: 32.8 pg (ref 26.0–34.0)
MCHC: 33.2 g/dL (ref 30.0–36.0)
MCV: 98.9 fL (ref 80.0–100.0)
Platelets: 117 10*3/uL — ABNORMAL LOW (ref 150–400)
RBC: 3.72 MIL/uL — ABNORMAL LOW (ref 3.87–5.11)
RDW: 13.8 % (ref 11.5–15.5)
WBC: 6 10*3/uL (ref 4.0–10.5)
nRBC: 0 % (ref 0.0–0.2)

## 2020-03-08 LAB — FERRITIN: Ferritin: 60 ng/mL (ref 11–307)

## 2020-03-08 LAB — RESP PANEL BY RT-PCR (FLU A&B, COVID) ARPGX2
Influenza A by PCR: NEGATIVE
Influenza B by PCR: NEGATIVE
SARS Coronavirus 2 by RT PCR: POSITIVE — AB

## 2020-03-08 LAB — PROCALCITONIN: Procalcitonin: <0.1 ng/mL

## 2020-03-08 LAB — BRAIN NATRIURETIC PEPTIDE: B Natriuretic Peptide: 187.6 pg/mL — ABNORMAL HIGH (ref 0.0–100.0)

## 2020-03-08 LAB — C-REACTIVE PROTEIN: CRP: 2 mg/dL — ABNORMAL HIGH (ref <1.0)

## 2020-03-08 LAB — D-DIMER, QUANTITATIVE: D-Dimer, Quant: 1.78 ug/mL-FEU — ABNORMAL HIGH (ref 0.00–0.50)

## 2020-03-08 MED ORDER — ASPIRIN EC 81 MG PO TBEC
81.0000 mg | DELAYED_RELEASE_TABLET | Freq: Every day | ORAL | Status: DC
  Administered 2020-03-08 – 2020-03-15 (×8): 81 mg via ORAL
  Filled 2020-03-08 (×8): qty 1

## 2020-03-08 MED ORDER — HYDROCODONE-ACETAMINOPHEN 5-325 MG PO TABS
1.0000 | ORAL_TABLET | ORAL | Status: DC | PRN
  Administered 2020-03-08 – 2020-03-09 (×3): 1 via ORAL
  Filled 2020-03-08 (×3): qty 1

## 2020-03-08 MED ORDER — ROSUVASTATIN CALCIUM 5 MG PO TABS
10.0000 mg | ORAL_TABLET | Freq: Every day | ORAL | Status: DC
  Administered 2020-03-08 – 2020-03-30 (×23): 10 mg via ORAL
  Filled 2020-03-08 (×23): qty 2

## 2020-03-08 MED ORDER — LACTATED RINGERS IV SOLN: INTRAVENOUS | Status: DC

## 2020-03-08 MED ORDER — ASPIRIN EC 81 MG PO TBEC
81.0000 mg | DELAYED_RELEASE_TABLET | Freq: Every day | ORAL | Status: DC
Start: 2020-03-08 — End: 2020-03-08

## 2020-03-08 MED ORDER — HYDROMORPHONE HCL 1 MG/ML IJ SOLN
0.5000 mg | INTRAMUSCULAR | Status: DC | PRN
  Administered 2020-03-08 – 2020-03-09 (×3): 0.5 mg via INTRAVENOUS
  Filled 2020-03-08 (×3): qty 0.5

## 2020-03-08 MED ORDER — LEVOTHYROXINE SODIUM 75 MCG PO TABS
75.0000 ug | ORAL_TABLET | Freq: Every day | ORAL | Status: DC
  Administered 2020-03-08 – 2020-03-16 (×9): 75 ug via ORAL
  Filled 2020-03-08 (×9): qty 1

## 2020-03-08 MED ORDER — ACETAMINOPHEN 650 MG RE SUPP: 650.0000 mg | Freq: Four times a day (QID) | RECTAL | Status: DC | PRN

## 2020-03-08 MED ORDER — ENOXAPARIN SODIUM 40 MG/0.4ML ~~LOC~~ SOLN: 40.0000 mg | SUBCUTANEOUS | Status: DC

## 2020-03-08 MED ORDER — ACETAMINOPHEN 325 MG PO TABS
650.0000 mg | ORAL_TABLET | Freq: Four times a day (QID) | ORAL | Status: DC | PRN
  Administered 2020-03-12 – 2020-03-30 (×6): 650 mg via ORAL
  Filled 2020-03-08 (×6): qty 2

## 2020-03-08 NOTE — ED Notes (Signed)
Visually checked on Pt normal rise and fall of chest

## 2020-03-08 NOTE — ED Notes (Signed)
Spoke to Pt husband and gave him an [place]. Gave Pt phone to talk to husband. Continued to check iv, all good

## 2020-03-08 NOTE — ED Notes (Addendum)
Pt stated that Pain medication was effective

## 2020-03-08 NOTE — Progress Notes (Signed)
Pt arrived on unit. AOx2. With clothings. VSS. Call bell within reach.

## 2020-03-08 NOTE — ED Notes (Signed)
Lunch Tray Ordered @ 1031. 

## 2020-03-08 NOTE — Progress Notes (Addendum)
[facility]                                                                              Patient Demographics  Jordan Horne, is a 86 y.o. female, DOB - 05-13-1932, ZOX:[id number]  Admit date - 03/07/2020   Admitting Physician Carlton AdamBradley S Chotiner, MD  Outpatient Primary MD for the patient is Dema SeverinYork, Regina F, NP  Outpatient specialists:   LOS - 0  days   Medical records reviewed and are as summarized below:    chief complain: Fall with moderate to severe pain in the back  Brief summary   Jordan SizerBobbie R Horne is a 86 y.o. female with medical history significant for mild dementia, HLD, hypothyroidism who presents after a fall at home. She was walking at home today when she fell down 3 steps at about 4:30 PM. Patient states she just tripped and fell. Patient states she hit the back of her head and landed on her right side. She is having moderate to severe pain in the back of her head and neck. Patient states she is having pain also on the right side of her ribs. She did not take anything for the pain at home.She denies any shortness of breath. No abdominal pain. She did not have LOC. No seizure activity reported.  Per husband, patient has completed both Covid vaccinations x2 and had received booster last week as well.   Assessment & Plan    Principal Problem:   Multiple fractures of ribs, right side, initial encounter for closed fracture -Secondary to mechanical fall at home, fractures of right eighth, ninth ribs with intractable pain -Follow PT evaluation -Continue pain control and incentive spirometry  Active Problems:  COVID-19 positive -Incidental COVID-19 positive on admission, currently no hypoxia.  Has pleuritic chest pain from the fractures of right eighth ninth ribs.  Fully vaccinated, booster last week. -Continue pain control incentive spirometry, O2 sats 98% on room air -Chest x-ray clear on 12/14, will obtain inflammatory markers.  Hold off on  steroids as currently no hypoxia.  Hold off on remdesivir unless inflammatory markers are elevated.   Hyperlipidemia Continue statin  Hypothyroidism Continue Synthroid   Code Status: Full CODE STATUS DVT Prophylaxis:  Lovenox  Family Communication: Discussed all imaging results, lab results, explained to the patient and patient's husband on the phone.   Disposition Plan:     Status is: Observation  The patient remains OBS appropriate and will d/c before 2 midnights.  Dispo: The patient is from: Home              Anticipated d/c is to: Home              Anticipated d/c date is: 2 days              Patient currently is not medically stable to d/c.  Intractable pain, incidental Covid positive      Time Spent in minutes 35 minutes  Procedures:  None Consultants:   None  Antimicrobials:   Anti-infectives (From admission, onward)   None          Medications  Scheduled Meds: . aspirin  EC  81 mg Oral Daily  . levothyroxine  75 mcg Oral Daily  . rosuvastatin  10 mg Oral Daily   Continuous Infusions: . lactated ringers 75 mL/hr at 03/08/20 0417   PRN Meds:.acetaminophen **OR** acetaminophen, HYDROcodone-acetaminophen, HYDROmorphone (DILAUDID) injection      Subjective:   Jordan Horne was seen and examined today.  Uncomfortable at the time of my examination, pain 7/10, constant, intractable, in the back and ribs.  Patient denies dizziness,  shortness of breath, abdominal pain, N/V/D/C, new weakness, numbess, tingling. No fevers  Objective:   Vitals:   03/08/20 0900 03/08/20 1115 03/08/20 1130 03/08/20 1200  BP: (!) 155/104 (!) 144/76 (!) 145/69 140/81  Pulse: 83 80 81 82  Resp: 16 17 15 18   Temp:      TempSrc:      SpO2: 97% 92% 95% 98%   No intake or output data in the 24 hours ending 03/08/20 1451   Wt Readings from Last 3 Encounters:  12/02/19 72.2 kg  09/09/19 73.5 kg  08/12/19 73.9 kg     Exam  General: Alert and oriented x 3,  NAD  Cardiovascular: S1 S2 auscultated, no murmurs, RRR  Respiratory: Clear to auscultation bilaterally, no wheezing, rales or rhonchi  Gastrointestinal: Soft, nontender, nondistended, + bowel sounds  Ext: no pedal edema bilaterally  Neuro: no new deficits  Musculoskeletal: No digital cyanosis, clubbing.  Tenderness to palpation of the right lower lateral rib cage  Skin: No rashes  Psych: Normal affect and demeanor, alert and oriented x3    Data Reviewed:  I have personally reviewed following labs and imaging studies  Micro Results Recent Results (from the past 240 hour(s))  Resp Panel by RT-PCR (Flu A&B, Covid) Nasopharyngeal Swab     Status: Abnormal   Collection Time: 03/07/20 11:08 PM   Specimen: Nasopharyngeal Swab; Nasopharyngeal(NP) swabs in vial transport medium  Result Value Ref Range Status   SARS Coronavirus 2 by RT PCR POSITIVE (A) NEGATIVE Final    Comment: RESULT CALLED TO, READ BACK BY AND VERIFIED WITH: 03/09/20, A RN 03/08/20 at 1224 sk (NOTE) SARS-CoV-2 target nucleic acids are DETECTED.  The SARS-CoV-2 RNA is generally detectable in upper respiratory specimens during the acute phase of infection. Positive results are indicative of the presence of the identified virus, but do not rule out bacterial infection or co-infection with other pathogens not detected by the test. Clinical correlation with patient history and other diagnostic information is necessary to determine patient infection status. The expected result is Negative.  Fact Sheet for Patients: 03/10/20  Fact Sheet for Healthcare Providers: BloggerCourse.com  This test is not yet approved or cleared by the [place] FDA and  has been authorized for detection and/or diagnosis of SARS-CoV-2 by FDA under an Emergency Use Authorization (EUA).  This EUA will remain in effect (meaning this test can be  used) for the duration of  the  COVID-19 declaration under Section 564(b)(1) of the Act, 21 U.S.C. section 360bbb-3(b)(1), unless the authorization is terminated or revoked sooner.     Influenza A by PCR NEGATIVE NEGATIVE Final   Influenza B by PCR NEGATIVE NEGATIVE Final    Comment: (NOTE) The Xpert Xpress SARS-CoV-2/FLU/RSV plus assay is intended as an aid in the diagnosis of influenza from Nasopharyngeal swab specimens and should not be used as a sole basis for treatment. Nasal washings and aspirates are unacceptable for Xpert Xpress SARS-CoV-2/FLU/RSV testing.  Fact Sheet for Patients: [place]  Fact Sheet for Healthcare Providers:  SeriousBroker.it  This test is not yet approved or cleared by the [place] and has been authorized for detection and/or diagnosis of SARS-CoV-2 by FDA under an Emergency Use Authorization (EUA). This EUA will remain in effect (meaning this test can be used) for the duration of the COVID-19 declaration under Section 564(b)(1) of the Act, 21 U.S.C. section 360bbb-3(b)(1), unless the authorization is terminated or revoked.  Performed at [facility], [address], [place], [place] [address]     Radiology Reports DG Ribs Unilateral W/Chest Right  Result Date: 03/07/2020 CLINICAL DATA:  Fall, right chest pain EXAM: RIGHT RIBS AND CHEST - 3+ VIEW COMPARISON:  None. FINDINGS: Single view radiograph of the chest and two view radiograph of the right ribs demonstrates acute minimally displaced fractures of the right eighth and ninth ribs posteriorly. Minimal left basilar atelectasis or infiltrate. Lungs are otherwise clear. No pneumothorax or pleural effusion. Cardiac size is at the upper limits of normal. Pulmonary vascularity is normal. IMPRESSION: Acute minimally displaced right 8, 9 rib fractures. No pneumothorax. Electronically Signed   By: Helyn Numbers MD   On: 03/07/2020 20:05   DG Thoracic Spine 2  View  Result Date: 03/07/2020 CLINICAL DATA:  Fall, back pain EXAM: THORACIC SPINE 2 VIEWS COMPARISON:  04/24/2018 FINDINGS: Two view radiograph thoracic spine demonstrates normal thoracic kyphosis. Mild thoracic dextroscoliosis, apex right at T5, unchanged. No acute fracture or listhesis of the thoracic spine. Vertebral body height has been preserved. There is diffuse intervertebral disc space narrowing and endplate remodeling throughout the thoracic spine in keeping with changes of diffuse severe degenerative disc disease. The paraspinal soft tissues are unremarkable. Incidental note is made of a small hiatal hernia. IMPRESSION: No acute fracture or listhesis. Electronically Signed   By: Helyn Numbers MD   On: 03/07/2020 20:02   DG Lumbar Spine Complete  Result Date: 03/07/2020 CLINICAL DATA:  Fall, back pain EXAM: LUMBAR SPINE - COMPLETE 4+ VIEW COMPARISON:  None. FINDINGS: Five view radiograph lumbar spine. Normal lumbar lordosis. No acute fracture or listhesis of the lumbar spine. Vertebral body height has been preserved. There is diffuse severe intervertebral disc space narrowing and endplate remodeling with multilevel vacuum disc phenomena in keeping with changes of diffuse severe degenerative disc disease throughout the lumbar spine. Oblique views demonstrate no evidence of pars defect. Paraspinal soft tissues are unremarkable. IMPRESSION: No acute fracture or listhesis. Electronically Signed   By: Helyn Numbers MD   On: 03/07/2020 20:06   CT Head Wo Contrast  Result Date: 03/07/2020 CLINICAL DATA:  Head trauma EXAM: CT HEAD WITHOUT CONTRAST TECHNIQUE: Contiguous axial images were obtained from the base of the skull through the vertex without intravenous contrast. COMPARISON:  CT brain 08/12/2019 FINDINGS: Brain: No acute territorial infarction, hemorrhage, or intracranial mass. Mild atrophy. Mild chronic small vessel ischemic changes of the white matter. Chronic appearing lacunar infarcts in  the basal ganglia. Stable punctate calcifications in the left periventricular white matter. Stable ventricle size. Vascular: No hyperdense vessels.  Carotid vascular calcification Skull: Normal. Negative for fracture or focal lesion. Sinuses/Orbits: Mild mucosal thickening in the sinuses Other: None IMPRESSION: 1. No CT evidence for acute intracranial abnormality. 2. Atrophy and mild chronic small vessel ischemic changes of the white matter. Electronically Signed   By: Jasmine Pang M.D.   On: 03/07/2020 20:21   CT Cervical Spine Wo Contrast  Result Date: 03/07/2020 CLINICAL DATA:  Trauma EXAM: CT CERVICAL SPINE WITHOUT CONTRAST TECHNIQUE: Multidetector CT imaging  of the cervical spine was performed without intravenous contrast. Multiplanar CT image reconstructions were also generated. COMPARISON:  08/12/2019 FINDINGS: Alignment: Stable alignment with trace retrolisthesis C5 on C6 and trace anterolisthesis C6 on C7. Facet alignment is maintained. Skull base and vertebrae: No acute fracture. No primary bone lesion or focal pathologic process. Soft tissues and spinal canal: No prevertebral fluid or swelling. No visible canal hematoma. Disc levels: Multiple level degenerative changes with moderate to marked disease at C4-C5 and C5-C6. Facet degenerative change at multiple levels. Upper chest: Negative.  No appreciable thyroid tissue identified. Other: None IMPRESSION: Stable alignment of the cervical spine with degenerative changes. No acute osseous abnormality. Electronically Signed   By: Jasmine Pang M.D.   On: 03/07/2020 20:26    Lab Data:  CBC: Recent Labs  Lab 03/07/20 2059 03/08/20 0500  WBC 10.0 6.0  NEUTROABS 7.3  --   HGB 13.2 12.2  HCT 41.4 36.8  MCV 99.8 98.9  PLT 140* 117*   Basic Metabolic Panel: Recent Labs  Lab 03/07/20 2059 03/08/20 0500  NA 140 141  K 4.6 3.6  CL 107 107  CO2 25 27  GLUCOSE 101* 87  BUN 13 11  CREATININE 1.07* 0.93  CALCIUM 8.6* 8.5*   GFR: CrCl  cannot be calculated (Unknown ideal weight.). Liver Function Tests: No results for input(s): AST, ALT, ALKPHOS, BILITOT, PROT, ALBUMIN in the last 168 hours. No results for input(s): LIPASE, AMYLASE in the last 168 hours. No results for input(s): AMMONIA in the last 168 hours. Coagulation Profile: No results for input(s): INR, PROTIME in the last 168 hours. Cardiac Enzymes: No results for input(s): CKTOTAL, CKMB, CKMBINDEX, TROPONINI in the last 168 hours. BNP (last 3 results) No results for input(s): PROBNP in the last 8760 hours. HbA1C: No results for input(s): HGBA1C in the last 72 hours. CBG: No results for input(s): GLUCAP in the last 168 hours. Lipid Profile: No results for input(s): CHOL, HDL, LDLCALC, TRIG, CHOLHDL, LDLDIRECT in the last 72 hours. Thyroid Function Tests: No results for input(s): TSH, T4TOTAL, FREET4, T3FREE, THYROIDAB in the last 72 hours. Anemia Panel: No results for input(s): VITAMINB12, FOLATE, FERRITIN, TIBC, IRON, RETICCTPCT in the last 72 hours. Urine analysis:    Component Value Date/Time   COLORURINE YELLOW 08/12/2019 1606   APPEARANCEUR CLOUDY (A) 08/12/2019 1606   LABSPEC 1.018 08/12/2019 1606   PHURINE 9.0 (H) 08/12/2019 1606   GLUCOSEU NEGATIVE 08/12/2019 1606   HGBUR NEGATIVE 08/12/2019 1606   BILIRUBINUR NEGATIVE 08/12/2019 1606   KETONESUR NEGATIVE 08/12/2019 1606   PROTEINUR NEGATIVE 08/12/2019 1606   UROBILINOGEN 0.2 12/09/2009 0006   NITRITE NEGATIVE 08/12/2019 1606   LEUKOCYTESUR NEGATIVE 08/12/2019 1606     Tniya Bowditch M.D. [facility] 03/08/2020, 2:51 PM   Call night coverage person covering after 7pm

## 2020-03-08 NOTE — ED Notes (Signed)
Pt switched to spoon on her own and is doing better with that

## 2020-03-08 NOTE — ED Notes (Signed)
Pt oriented to self and place which is her baseline. Continue to reposition pt and keep check on IV

## 2020-03-08 NOTE — ED Notes (Signed)
Repositioned pt for 2nd time and straightened arm to keep iv from occluding. Attempted to reposition plate so pt can eat.

## 2020-03-08 NOTE — ED Notes (Signed)
Tele  Breakfast Ordered 

## 2020-03-08 NOTE — ED Notes (Signed)
Pt seems to eat better with hands than a fork. I have cut her food up and watched her chew and swallow. So far she seems to be doing ok, Her hands shake some but she is in contriol of function. Part of the problem is the ED bed and Tray postion. Will attempt to fix IV is still ok and infusing well

## 2020-03-08 NOTE — ED Notes (Signed)
Pt's IV removed. Pt had continued to moved and flex arm that the catheter was too loose and could not be secured.It did not appear to be infiltrated but needled to be removed.  New IV started, Will resume LR momentarily

## 2020-03-09 LAB — CBC
HCT: 37.6 % (ref 36.0–46.0)
Hemoglobin: 13 g/dL (ref 12.0–15.0)
MCH: 32.7 pg (ref 26.0–34.0)
MCHC: 34.6 g/dL (ref 30.0–36.0)
MCV: 94.7 fL (ref 80.0–100.0)
Platelets: 110 10*3/uL — ABNORMAL LOW (ref 150–400)
RBC: 3.97 MIL/uL (ref 3.87–5.11)
RDW: 13.6 % (ref 11.5–15.5)
WBC: 7.5 10*3/uL (ref 4.0–10.5)
nRBC: 0 % (ref 0.0–0.2)

## 2020-03-09 LAB — COMPREHENSIVE METABOLIC PANEL
ALT: 27 U/L (ref 0–44)
AST: 27 U/L (ref 15–41)
Albumin: 3.2 g/dL — ABNORMAL LOW (ref 3.5–5.0)
Alkaline Phosphatase: 66 U/L (ref 38–126)
Anion gap: 11 (ref 5–15)
BUN: 8 mg/dL (ref 8–23)
CO2: 23 mmol/L (ref 22–32)
Calcium: 8.8 mg/dL — ABNORMAL LOW (ref 8.9–10.3)
Chloride: 105 mmol/L (ref 98–111)
Creatinine, Ser: 0.77 mg/dL (ref 0.44–1.00)
GFR, Estimated: >60 mL/min (ref >60)
Glucose, Bld: 106 mg/dL — ABNORMAL HIGH (ref 70–99)
Potassium: 3.3 mmol/L — ABNORMAL LOW (ref 3.5–5.1)
Sodium: 139 mmol/L (ref 135–145)
Total Bilirubin: 1 mg/dL (ref 0.3–1.2)
Total Protein: 5.8 g/dL — ABNORMAL LOW (ref 6.5–8.1)

## 2020-03-09 LAB — C-REACTIVE PROTEIN: CRP: 4.1 mg/dL — ABNORMAL HIGH (ref <1.0)

## 2020-03-09 LAB — BRAIN NATRIURETIC PEPTIDE: B Natriuretic Peptide: 148.2 pg/mL — ABNORMAL HIGH (ref 0.0–100.0)

## 2020-03-09 LAB — D-DIMER, QUANTITATIVE: D-Dimer, Quant: 1.65 ug/mL-FEU — ABNORMAL HIGH (ref 0.00–0.50)

## 2020-03-09 MED ORDER — HYDROMORPHONE HCL 1 MG/ML IJ SOLN
0.5000 mg | INTRAMUSCULAR | Status: DC | PRN
  Administered 2020-03-10 – 2020-03-13 (×10): 0.5 mg via INTRAVENOUS
  Filled 2020-03-09 (×10): qty 0.5

## 2020-03-09 MED ORDER — LIDOCAINE 5 % EX PTCH
1.0000 | MEDICATED_PATCH | CUTANEOUS | Status: DC
  Administered 2020-03-09 – 2020-03-30 (×22): 1 via TRANSDERMAL
  Filled 2020-03-09 (×22): qty 1

## 2020-03-09 MED ORDER — KETOROLAC TROMETHAMINE 15 MG/ML IJ SOLN
15.0000 mg | Freq: Four times a day (QID) | INTRAMUSCULAR | Status: DC
  Administered 2020-03-09 – 2020-03-13 (×14): 15 mg via INTRAVENOUS
  Filled 2020-03-09 (×15): qty 1

## 2020-03-09 MED ORDER — ACETAMINOPHEN 325 MG PO TABS
650.0000 mg | ORAL_TABLET | Freq: Three times a day (TID) | ORAL | Status: DC
  Administered 2020-03-09 – 2020-03-30 (×59): 650 mg via ORAL
  Filled 2020-03-09 (×62): qty 2

## 2020-03-09 MED ORDER — POTASSIUM CHLORIDE CRYS ER 20 MEQ PO TBCR
40.0000 meq | EXTENDED_RELEASE_TABLET | Freq: Once | ORAL | Status: AC
  Administered 2020-03-09: 08:00:00 40 meq via ORAL
  Filled 2020-03-09: qty 2

## 2020-03-09 NOTE — Evaluation (Signed)
Physical Therapy Evaluation Patient Details Name: Jordan Horne MRN: [id number] DOB: 04/17/1932 Today's Date: 03/09/2020   History of Present Illness  Pt is an 86 y.o. female admitted 03/07/20 after fall down steps hitting the back of her head with no LOC. Pt sustained R 8-9th rib fxs. Incidental (+) COVID-19; pt fully vaccinated. CXR clear. PMH includes mild dementia.    Clinical Impression  Pt presents with an overall decrease in functional mobility secondary to above. Pt poor historian, oriented to self, reports from home with husband with intermittent use of walker and cane. Today, pt required min-modA for limited mobility with RW, tremulous with activity; pt limited by c/o R-side pain throughout session, as well as generalized weakness, decreased attention and poor problem solving. Pt would benefit from continued acute PT services to maximize functional mobility and independence. Pt will require 24/7 assist if to return home, which may be a better option given pt's dementia; if necessary support not available, would benefit from SNF-level therapies.     Follow Up Recommendations SNF;Supervision/Assistance - 24 hour (vs. HHPT with 24/7 support)    Equipment Recommendations   (TBD)    Recommendations for Other Services       Precautions / Restrictions Precautions Precautions: Fall Restrictions Weight Bearing Restrictions: No      Mobility  Bed Mobility Overal bed mobility: Needs Assistance Bed Mobility: Supine to Sit     Supine to sit: Min assist;HOB elevated     General bed mobility comments: Cues for attention and sequencing; minA to assist trunk elevation and scoot hips to EOB; pt limited by pain    Transfers Overall transfer level: Needs assistance Equipment used: Rolling walker (2 wheeled) Transfers: Sit to/from Stand Sit to Stand: Mod assist         General transfer comment: ModA to assist trunk elevation and maintain balance, pt with posterior lean  requiring increased time to correct  Ambulation/Gait Ambulation/Gait assistance: Min assist;Mod assist Gait Distance (Feet): 4 Feet Assistive device: Rolling walker (2 wheeled) Gait Pattern/deviations: Step-to pattern;Shuffle;Trunk flexed;Antalgic Gait velocity: Decreased   General Gait Details: Slow, tremulous, antalgic gait with RW and intermittent minA for balance; pt requiring frequent cues for attention and sequencing; modA for RW navigation to redirect for return to sit, as pt distracted looking out window  Stairs            Wheelchair Mobility    Modified Rankin (Stroke Patients Only)       Balance Overall balance assessment: Needs assistance   Sitting balance-Leahy Scale: Fair       Standing balance-Leahy Scale: Poor Standing balance comment: Reliant on UE support                             Pertinent Vitals/Pain Pain Assessment: Faces Faces Pain Scale: Hurts even more Pain Location: R-side ribs Pain Descriptors / Indicators: Grimacing;Guarding;Moaning Pain Intervention(s): Monitored during session;Limited activity within patient's tolerance;RN gave pain meds during session    Home Living Family/patient expects to be discharged to:: Private residence Living Arrangements: Spouse/significant other Available Help at Discharge: Family Type of Home: House           Additional Comments: Pt poor historian; reports she lives with husband who assists as needed    Prior Function Level of Independence: Needs assistance   Gait / Transfers Assistance Needed: Admitted for fall. Pt reports she uses cane or walker sometimes, prefers cane  ADL's / Homemaking Assistance Needed:  Likely requires at least supervision for majority of ADL tasks        Hand Dominance        Extremity/Trunk Assessment   Upper Extremity Assessment Upper Extremity Assessment: Generalized weakness (tremulous with mobility and at rest)    Lower Extremity  Assessment Lower Extremity Assessment: Generalized weakness    Cervical / Trunk Assessment Cervical / Trunk Assessment: Kyphotic  Communication      Cognition Arousal/Alertness: Awake/alert Behavior During Therapy: WFL for tasks assessed/performed;Restless;Anxious Overall Cognitive Status: History of cognitive impairments - at baseline                                 General Comments: H/o dementia. Pt oriented to self. Requires short, simple cues with pt's name stated before command; following majority of simple commands, sometimes with repeated cues to task; reports, "I can't" when asked to move, but still willing to move. Internally distracted by pain; apparent cognitive impairments likely exacerbated by this      General Comments General comments (skin integrity, edema, etc.): Incentive spirometer provided, NT present as sitter and to educate pt on use once pt rested    Exercises     Assessment/Plan    PT Assessment Patient needs continued PT services  PT Problem List Decreased strength;Decreased activity tolerance;Decreased balance;Decreased mobility;Decreased cognition;Decreased knowledge of use of DME;Decreased safety awareness;Pain       PT Treatment Interventions DME instruction;Gait training;Stair training;Functional mobility training;Therapeutic activities;Therapeutic exercise;Balance training;Patient/family education;Cognitive remediation    PT Goals (Current goals can be found in the Care Plan section)  Acute Rehab PT Goals Patient Stated Goal: "I can't do it" PT Goal Formulation: With patient Time For Goal Achievement: 03/23/20 Potential to Achieve Goals: Fair    Frequency Min 3X/week   Barriers to discharge        Co-evaluation               AM-PAC PT "6 Clicks" Mobility  Outcome Measure Help needed turning from your back to your side while in a flat bed without using bedrails?: A Little Help needed moving from lying on your back to  sitting on the side of a flat bed without using bedrails?: A Little Help needed moving to and from a bed to a chair (including a wheelchair)?: A Lot Help needed standing up from a chair using your arms (e.g., wheelchair or bedside chair)?: A Lot Help needed to walk in hospital room?: A Lot Help needed climbing 3-5 steps with a railing? : A Lot 6 Click Score: 14    End of Session Equipment Utilized During Treatment: Gait belt Activity Tolerance: Patient tolerated treatment well;Patient limited by pain Patient left: in chair;with call bell/phone within reach;with chair alarm set;with nursing/sitter in room Nurse Communication: Mobility status PT Visit Diagnosis: Other abnormalities of gait and mobility (R26.89);Pain;Muscle weakness (generalized) (M62.81)    Time: 5462-7035 PT Time Calculation (min) (ACUTE ONLY): 16 min   Charges:   PT Evaluation $PT Eval Moderate Complexity: 1 Mod     [name], PT, DPT Acute Rehabilitation Services  Pager [phone] Office [phone]  [name] 03/09/2020, 10:15 AM

## 2020-03-09 NOTE — TOC CAGE-AID Note (Signed)
Transition of Care [facility]) - CAGE-AID Screening   Patient Details  Name: TAWSHA TERRERO MRN: [id number] Date of Birth: [birth year]  Transition of Care [facility]) CM/SW Contact:    Mearl Latin, LCSW Phone Number: 03/09/2020, 3:17 PM   Clinical Narrative: Patient unable to participate in CAGE-AID Screening due to Dementia diagnosis (Ox1).   CAGE-AID Screening: Substance Abuse Screening unable to be completed due to: : Patient unable to participate

## 2020-03-09 NOTE — TOC Progression Note (Signed)
Transition of Care [facility]) - Progression Note    Patient Details  Name: Jordan Horne MRN: [id number] Date of Birth: 1932/10/06  Transition of Care [facility]) CM/SW Contact  Mearl Latin, LCSW Phone Number: 03/09/2020, 2:33 PM  Clinical Narrative:    CSW notified by MD that patient's spouse is now requesting SNF placement. CSW left voicemail for patient's spouse and requested [facility] review referral as [facility] has a waitlist. No other COVID options available.     Expected Discharge Plan: Skilled Nursing Facility Barriers to Discharge: SNF Pending bed offer,Continued Medical Work up  Expected Discharge Plan and Services Expected Discharge Plan: Skilled Nursing Facility   Discharge Planning Services: CM Consult Post Acute Care Choice: Skilled Nursing Facility Living arrangements for the past 2 months: Single Family Home                           HH Arranged: PT,OT,Nurse's Aide,Social Work [employer] Agency: [facility] Date [facility] Agency Contacted: 03/09/20 Time HH Agency Contacted: [phone] Representative spoke with at [facility] Agency: Wayne Both   Social Determinants of Health (SDOH) Interventions    Readmission Risk Interventions No flowsheet data found.

## 2020-03-09 NOTE — Progress Notes (Addendum)
[facility]                                                                              Patient Demographics  Jordan Horne, is a 86 y.o. female, DOB - 27-Feb-1933, JOI:[id number]  Admit date - 03/07/2020   Admitting Physician Owynn Mosqueda Jenna Luo, MD  Outpatient Primary MD for the patient is Dema Severin, NP  Outpatient specialists:   LOS - 1  days   Medical records reviewed and are as summarized below:    chief complain: Fall with moderate to severe pain in the back  Brief summary   Jordan Horne is a 86 y.o. female with medical history significant for mild dementia, HLD, hypothyroidism who presents after a fall at home. She was walking at home today when she fell down 3 steps at about 4:30 PM. Patient states she just tripped and fell. Patient states she hit the back of her head and landed on her right side. She is having moderate to severe pain in the back of her head and neck. Patient states she is having pain also on the right side of her ribs. She did not take anything for the pain at home.She denies any shortness of breath. No abdominal pain. She did not have LOC. No seizure activity reported.  Per husband, patient has completed both Covid vaccinations x2 and had received booster last week as well.   Assessment & Plan    Principal Problem:   Multiple fractures of ribs, right side, initial encounter for closed fracture -Secondary to mechanical fall at home, fractures of right eighth, ninth ribs with intractable pain -Lumbar spine x-ray negative for any fractures -PT evaluation recommended SNF versus home health PT with 24/7 support.   -Patient somewhat disoriented and lethargic today, will DC hydrocodone, decrease dose of Dilaudid.  Placed on IV Toradol and scheduled Tylenol, Lidoderm patch. -discussed with patient's husband in detail, he doesn't feel he would be able to take care of her by himself.  He discussed with the patient's sons who also feel  that she needs rehab.  Social work consult placed for SNF, they prefer [facility] [place]  Active Problems:  COVID-19 positive -Incidental COVID-19 positive on admission, currently no hypoxia.  Has pleuritic chest pain from the fractures of right eighth ninth ribs.  Fully vaccinated, booster last week. -Continue pain control incentive spirometry, O2 sats 98% on room air -Chest x-ray clear on 12/14 -Per pharmacy, does not meet criteria for Mab infusion due to partially or fully vaccinated for COVID-19, asymptomatic with a cycle time> 32   Hyperlipidemia Continue statin  Hypothyroidism Continue Synthroid   Code Status: Full CODE STATUS DVT Prophylaxis:  Lovenox  Family Communication: Discussed all imaging results, lab results, explained to the patient and patient's husband on the phone on 12/15 and today.   Disposition Plan:     Status is: Inpatient  The patient will require care spanning > 2 midnights and Dispo: The patient is from: Home              Anticipated d/c is to: SNF  Anticipated d/c date is: 2 days              Patient currently is not medically stable to d/c.  Intractable pain, incidental Covid positive, somewhat lethargic and disoriented today.     Time Spent in minutes 25 minutes  Procedures:  None Consultants:   None  Antimicrobials:   Anti-infectives (From admission, onward)   None         Medications  Scheduled Meds: . acetaminophen  650 mg Oral TID  . aspirin EC  81 mg Oral Daily  . ketorolac  15 mg Intravenous Q6H  . levothyroxine  75 mcg Oral Daily  . rosuvastatin  10 mg Oral Daily   Continuous Infusions:  PRN Meds:.acetaminophen **OR** acetaminophen, HYDROmorphone (DILAUDID) injection      Subjective:   Jordan Horne was seen and examined today.  Sitter at the bedside, somewhat lethargic and disoriented.  Still having lower back pain, right lateral pain from the rib fractures.    Patient denies dizziness,   shortness of breath, abdominal pain, N/V/D/C, new weakness, numbess, tingling. No fevers  Objective:   Vitals:   03/09/20 0400 03/09/20 0714 03/09/20 1123 03/09/20 1200  BP: 128/86 (!) 153/71 (!) 129/56 111/73  Pulse: 88 87 85 86  Resp: Temp: 99.2 F (37.3 C) 98.4 F (36.9 C) 97.9 F (36.6 C)   TempSrc: Oral Oral Oral   SpO2: 93% 96% 96% 95%  Height:        Intake/Output Summary (Last 24 hours) at 03/09/2020 1412 Last data filed at 03/09/2020 1347 Gross per 24 hour  Intake 1452.5 ml  Output 950 ml  Net 502.5 ml     Wt Readings from Last 3 Encounters:  12/02/19 72.2 kg  09/09/19 73.5 kg  08/12/19 73.9 kg   Physical Exam  General: Awake alert somewhat lethargic and slightly confused  Cardiovascular: S1 S2 clear, RRR. No pedal edema b/l  Respiratory: CTAB, no wheezing, rales or rhonchi  Gastrointestinal: Soft, nontender, nondistended, NBS  Ext: no pedal edema bilaterally  Neuro: no new deficits  Musculoskeletal: No cyanosis, clubbing, tenderness to palpation at the right lower lateral rib cage  Skin: No rashes  Psych: oriented to self   Data Reviewed:  I have personally reviewed following labs and imaging studies  Micro Results Recent Results (from the past 240 hour(s))  Resp Panel by RT-PCR (Flu A&B, Covid) Nasopharyngeal Swab     Status: Abnormal   Collection Time: 03/07/20 11:08 PM   Specimen: Nasopharyngeal Swab; Nasopharyngeal(NP) swabs in vial transport medium  Result Value Ref Range Status   SARS Coronavirus 2 by RT PCR POSITIVE (A) NEGATIVE Final    Comment: RESULT CALLED TO, READ BACK BY AND VERIFIED WITH: Laurice Record, A RN 03/08/20 at 1224 sk (NOTE) SARS-CoV-2 target nucleic acids are DETECTED.  The SARS-CoV-2 RNA is generally detectable in upper respiratory specimens during the acute phase of infection. Positive results are indicative of the presence of the identified virus, but do not rule out bacterial infection or co-infection  with other pathogens not detected by the test. Clinical correlation with patient history and other diagnostic information is necessary to determine patient infection status. The expected result is Negative.  Fact Sheet for Patients: BloggerCourse.com  Fact Sheet for Healthcare Providers: SeriousBroker.it  This test is not yet approved or cleared by the [place] FDA and  has been authorized for detection and/or diagnosis of SARS-CoV-2 by FDA under an Emergency Use Authorization (EUA).  This EUA will remain in effect (meaning this test can be  used) for the duration of  the COVID-19 declaration under Section 564(b)(1) of the Act, 21 U.S.C. section 360bbb-3(b)(1), unless the authorization is terminated or revoked sooner.     Influenza A by PCR NEGATIVE NEGATIVE Final   Influenza B by PCR NEGATIVE NEGATIVE Final    Comment: (NOTE) The Xpert Xpress SARS-CoV-2/FLU/RSV plus assay is intended as an aid in the diagnosis of influenza from Nasopharyngeal swab specimens and should not be used as a sole basis for treatment. Nasal washings and aspirates are unacceptable for Xpert Xpress SARS-CoV-2/FLU/RSV testing.  Fact Sheet for Patients: BloggerCourse.com  Fact Sheet for Healthcare Providers: SeriousBroker.it  This test is not yet approved or cleared by the [place] FDA and has been authorized for detection and/or diagnosis of SARS-CoV-2 by FDA under an Emergency Use Authorization (EUA). This EUA will remain in effect (meaning this test can be used) for the duration of the COVID-19 declaration under Section 564(b)(1) of the Act, 21 U.S.C. section 360bbb-3(b)(1), unless the authorization is terminated or revoked.  Performed at [facility], [address] [place], [place] [address]     Radiology Reports DG Ribs Unilateral W/Chest Right  Result Date:  03/07/2020 CLINICAL DATA:  Fall, right chest pain EXAM: RIGHT RIBS AND CHEST - 3+ VIEW COMPARISON:  None. FINDINGS: Single view radiograph of the chest and two view radiograph of the right ribs demonstrates acute minimally displaced fractures of the right eighth and ninth ribs posteriorly. Minimal left basilar atelectasis or infiltrate. Lungs are otherwise clear. No pneumothorax or pleural effusion. Cardiac size is at the upper limits of normal. Pulmonary vascularity is normal. IMPRESSION: Acute minimally displaced right 8, 9 rib fractures. No pneumothorax. Electronically Signed   By: Helyn Numbers MD   On: 03/07/2020 20:05   DG Thoracic Spine 2 View  Result Date: 03/07/2020 CLINICAL DATA:  Fall, back pain EXAM: THORACIC SPINE 2 VIEWS COMPARISON:  04/24/2018 FINDINGS: Two view radiograph thoracic spine demonstrates normal thoracic kyphosis. Mild thoracic dextroscoliosis, apex right at T5, unchanged. No acute fracture or listhesis of the thoracic spine. Vertebral body height has been preserved. There is diffuse intervertebral disc space narrowing and endplate remodeling throughout the thoracic spine in keeping with changes of diffuse severe degenerative disc disease. The paraspinal soft tissues are unremarkable. Incidental note is made of a small hiatal hernia. IMPRESSION: No acute fracture or listhesis. Electronically Signed   By: Helyn Numbers MD   On: 03/07/2020 20:02   DG Lumbar Spine Complete  Result Date: 03/07/2020 CLINICAL DATA:  Fall, back pain EXAM: LUMBAR SPINE - COMPLETE 4+ VIEW COMPARISON:  None. FINDINGS: Five view radiograph lumbar spine. Normal lumbar lordosis. No acute fracture or listhesis of the lumbar spine. Vertebral body height has been preserved. There is diffuse severe intervertebral disc space narrowing and endplate remodeling with multilevel vacuum disc phenomena in keeping with changes of diffuse severe degenerative disc disease throughout the lumbar spine. Oblique views  demonstrate no evidence of pars defect. Paraspinal soft tissues are unremarkable. IMPRESSION: No acute fracture or listhesis. Electronically Signed   By: Helyn Numbers MD   On: 03/07/2020 20:06   CT Head Wo Contrast  Result Date: 03/07/2020 CLINICAL DATA:  Head trauma EXAM: CT HEAD WITHOUT CONTRAST TECHNIQUE: Contiguous axial images were obtained from the base of the skull through the vertex without intravenous contrast. COMPARISON:  CT brain 08/12/2019 FINDINGS: Brain: No acute territorial infarction, hemorrhage, or intracranial mass.  Mild atrophy. Mild chronic small vessel ischemic changes of the white matter. Chronic appearing lacunar infarcts in the basal ganglia. Stable punctate calcifications in the left periventricular white matter. Stable ventricle size. Vascular: No hyperdense vessels.  Carotid vascular calcification Skull: Normal. Negative for fracture or focal lesion. Sinuses/Orbits: Mild mucosal thickening in the sinuses Other: None IMPRESSION: 1. No CT evidence for acute intracranial abnormality. 2. Atrophy and mild chronic small vessel ischemic changes of the white matter. Electronically Signed   By: Jasmine PangKim  Fujinaga M.D.   On: 03/07/2020 20:21   CT Cervical Spine Wo Contrast  Result Date: 03/07/2020 CLINICAL DATA:  Trauma EXAM: CT CERVICAL SPINE WITHOUT CONTRAST TECHNIQUE: Multidetector CT imaging of the cervical spine was performed without intravenous contrast. Multiplanar CT image reconstructions were also generated. COMPARISON:  08/12/2019 FINDINGS: Alignment: Stable alignment with trace retrolisthesis C5 on C6 and trace anterolisthesis C6 on C7. Facet alignment is maintained. Skull base and vertebrae: No acute fracture. No primary bone lesion or focal pathologic process. Soft tissues and spinal canal: No prevertebral fluid or swelling. No visible canal hematoma. Disc levels: Multiple level degenerative changes with moderate to marked disease at C4-C5 and C5-C6. Facet degenerative change at  multiple levels. Upper chest: Negative.  No appreciable thyroid tissue identified. Other: None IMPRESSION: Stable alignment of the cervical spine with degenerative changes. No acute osseous abnormality. Electronically Signed   By: Jasmine PangKim  Fujinaga M.D.   On: 03/07/2020 20:26    Lab Data:  CBC: Recent Labs  Lab 03/07/20 2059 03/08/20 0500 03/09/20 0254  WBC 10.0 6.0 7.5  NEUTROABS 7.3  --   --   HGB 13.2 12.2 13.0  HCT 41.4 36.8 37.6  MCV 99.8 98.9 94.7  PLT 140* 117* 110*   Basic Metabolic Panel: Recent Labs  Lab 03/07/20 2059 03/08/20 0500 03/09/20 0254  NA 140 141 139  K 4.6 3.6 3.3*  CL 107 107 105  CO2 25 27 23   GLUCOSE 101* 87 106*  BUN 13 11 8   CREATININE 1.07* 0.93 0.77  CALCIUM 8.6* 8.5* 8.8*   GFR: CrCl cannot be calculated (Unknown ideal weight.). Liver Function Tests: Recent Labs  Lab 03/09/20 0254  AST 27  ALT 27  ALKPHOS 66  BILITOT 1.0  PROT 5.8*  ALBUMIN 3.2*   No results for input(s): LIPASE, AMYLASE in the last 168 hours. No results for input(s): AMMONIA in the last 168 hours. Coagulation Profile: No results for input(s): INR, PROTIME in the last 168 hours. Cardiac Enzymes: No results for input(s): CKTOTAL, CKMB, CKMBINDEX, TROPONINI in the last 168 hours. BNP (last 3 results) No results for input(s): PROBNP in the last 8760 hours. HbA1C: No results for input(s): HGBA1C in the last 72 hours. CBG: No results for input(s): GLUCAP in the last 168 hours. Lipid Profile: No results for input(s): CHOL, HDL, LDLCALC, TRIG, CHOLHDL, LDLDIRECT in the last 72 hours. Thyroid Function Tests: No results for input(s): TSH, T4TOTAL, FREET4, T3FREE, THYROIDAB in the last 72 hours. Anemia Panel: Recent Labs    03/08/20 1529  FERRITIN 60   Urine analysis:    Component Value Date/Time   COLORURINE YELLOW 08/12/2019 1606   APPEARANCEUR CLOUDY (A) 08/12/2019 1606   LABSPEC 1.018 08/12/2019 1606   PHURINE 9.0 (H) 08/12/2019 1606   GLUCOSEU NEGATIVE  08/12/2019 1606   HGBUR NEGATIVE 08/12/2019 1606   BILIRUBINUR NEGATIVE 08/12/2019 1606   KETONESUR NEGATIVE 08/12/2019 1606   PROTEINUR NEGATIVE 08/12/2019 1606   UROBILINOGEN 0.2 12/09/2009 0006   NITRITE NEGATIVE  08/12/2019 1606   LEUKOCYTESUR NEGATIVE 08/12/2019 1606     Jordan Horne M.D. [facility] 03/09/2020, 2:12 PM   Call night coverage person covering after 7pm

## 2020-03-09 NOTE — TOC Initial Note (Addendum)
Transition of Care [facility]) - Initial/Assessment Note    Patient Details  Name: Jordan Horne MRN: [id number] Date of Birth: [birth year]  Transition of Care [facility]) CM/SW Contact:    Lockie Pares, RN Phone Number: 03/09/2020, 10:38 AM  Clinical Narrative:                 Admitted with fall back pain, rib fractures. Spoke to husband via telephone, he feels he can care for her at home, he has a sone that lives a mile away, neighbors, and he will be with her 24/7. He normally cares for her at home, she is incontinent, they use adult diapers, He would like Home health, he has no specific Home health. Recommend PT OT, Aide and social work. Patient has a PCP that she sees, Dr Chinita Greenland, lives in [place]. Husband  has no symptoms of COVID, they both just got their booster shot, he is awaiting his results. He is able to transport his wife home when she is discharged.  1100: [facility] accepted for [facility] 1439: Family has spoken to Dr Isidoro Donning and decided to peruse SNF post hospitalization. Denyse Amass from [place] notified.   Expected Discharge Plan: Home w Home Health Services Barriers to Discharge: Continued Medical Work up   Patient Goals and CMS Choice        Expected Discharge Plan and Services Expected Discharge Plan: Home w Home Health Services   Discharge Planning Services: CM Consult   Living arrangements for the past 2 months: Single Family Home                                      Prior Living Arrangements/Services Living arrangements for the past 2 months: Single Family Home Lives with:: Spouse Patient language and need for interpreter reviewed:: Yes        Need for Family Participation in Patient Care: Yes (Comment) Care giver support system in place?: Yes (comment) Current home services: DME (Has walker, cane, BSC at home) Criminal Activity/Legal Involvement Pertinent to Current Situation/Hospitalization: No - Comment as needed  Activities of Daily Living Home Assistive  Devices/Equipment: Walker (specify type),Cane (specify quad or straight) ADL Screening (condition at time of admission) Patient's cognitive ability adequate to safely complete daily activities?: Yes Is the patient deaf or have difficulty hearing?: No Does the patient have difficulty seeing, even when wearing glasses/contacts?: No Does the patient have difficulty concentrating, remembering, or making decisions?: Yes Patient able to express need for assistance with ADLs?: Yes Does the patient have difficulty dressing or bathing?: Yes Independently performs ADLs?: No Communication: Independent Dressing (OT): Needs assistance Is this a change from baseline?: Change from baseline, expected to last >3 days Grooming: Appropriate for developmental age Feeding: Needs assistance Is this a change from baseline?: Change from baseline, expected to last >3 days Bathing: Dependent Is this a change from baseline?: Change from baseline, expected to last >3 days Toileting: Needs assistance Is this a change from baseline?: Change from baseline, expected to last >3days In/Out Bed: Dependent Is this a change from baseline?: Change from baseline, expected to last >3 days Walks in Home: Independent with device (comment) (walking canes) Does the patient have difficulty walking or climbing stairs?: Yes Weakness of Legs: Both Weakness of Arms/Hands: Both  Permission Sought/Granted      Share Information with NAME: Ree Kida Husband           Emotional Assessment Appearance::  Appears stated age     Orientation: : Fluctuating Orientation (Suspected and/or reported Sundowners) Alcohol / Substance Use: Not Applicable Psych Involvement: No (comment)  Admission diagnosis:  Right rib fracture [S22.31XA] Fall, initial encounter [W19.XXXA] Closed fracture of multiple ribs of right side, initial encounter [S22.41XA] Patient Active Problem List   Diagnosis Date Noted  . Multiple fractures of ribs, right side,  initial encounter for closed fracture 03/07/2020  . Intractable pain 03/07/2020  . Dementia (HCC) 03/07/2020  . Fall at home, initial encounter 03/07/2020  . Elevated blood pressure reading 03/07/2020  . Prolonged QT interval 03/07/2020  . Right rib fracture 03/07/2020  . Abnormal nuclear stress test 07/27/2019  . Abnormal electrocardiogram (ECG) (EKG) 05/07/2018  . SBO (small bowel obstruction) (HCC) 03/24/2018  . CAP (community acquired pneumonia) 03/24/2018  . Chest pain of uncertain etiology 09/29/2013  . Hypothyroidism 09/29/2013  . Hypercholesterolemia 09/29/2013   PCP:  Dema Severin, NP Pharmacy:   [facility] [phone] - [place], [place] - [address] [facility] [address] Phone: [phone] Fax: [phone]     Social Determinants of Health (SDOH) Interventions    Readmission Risk Interventions No flowsheet data found.

## 2020-03-09 NOTE — Progress Notes (Signed)
Pharmacy COVID-19 Monoclonal Antibody Screening  Jordan Horne was identified as being not hospitalized with symptoms from Covid-19 on admission but an incidental positive PCR has been documented. The patient may qualify for the use of monoclonal antibodies (mAB) for COVID-19 viral infection to prevent worsening symptoms stemming from Covid-19 infection.  The patient was identified based on a positive COVID-19 PCR and not requiring the use of supplemental oxygen at this time.  This patient meets the FDA criteria for Emergency Use Authorization of casirivimab/imdevimab.   Has a (+) direct SARS-CoV-2 viral test result   Is NOT hospitalized due to COVID-19   Is within 10 days of symptom onset   Has at least one of the high risk factor(s) for progression to severe COVID-19 and/or hospitalization as defined in EUA.   Specific high risk criteria:  Age > 38 , hypertension  Additionally: The patient (has / has not) had a positive COVID-19 PCR in the last 90 days.  The patient is fully vaccinated against COVID-19.  The patient does not meet criteria for mAB administration due to being partially or fully vaccinated for COVID-19, asymptomatic, with a cycle time > 32  This eligibility and indication for treatment was discussed with the patient's physician: Dr. Thad Ranger  Plan: Based on the above discussion, it was decided that the patient will not receive a dose of monoclonal antibody.   Noah Delaine, RPh Clinical Pharmacist 03/09/2020 12:10 PM

## 2020-03-09 NOTE — NC FL2 (Signed)
[place] MEDICAID FL2 LEVEL OF CARE SCREENING TOOL     IDENTIFICATION  Patient Name: Jordan Horne Birthdate: 1932/07/07 Sex: female Admission Date (Current Location): 03/07/2020  [facility] [place] Number:  Producer, television/film/video and Address:  [facility], [address], [place], [place] [address]      Provider Number: [id number]  Attending Physician Name and Address:  Cathren Harsh, MD  Relative Name and Phone Number:  Annice Pih, spouse, [phone]    Current Level of Care: Hospital Recommended Level of Care: Skilled Nursing Facility Prior Approval Number:    Date Approved/Denied:   PASRR Number: [id number]  Discharge Plan: SNF    Current Diagnoses: Patient Active Problem List   Diagnosis Date Noted  . Multiple fractures of ribs, right side, initial encounter for closed fracture 03/07/2020  . Intractable pain 03/07/2020  . Dementia (HCC) 03/07/2020  . Fall at home, initial encounter 03/07/2020  . Elevated blood pressure reading 03/07/2020  . Prolonged QT interval 03/07/2020  . Right rib fracture 03/07/2020  . Abnormal nuclear stress test 07/27/2019  . Abnormal electrocardiogram (ECG) (EKG) 05/07/2018  . SBO (small bowel obstruction) (HCC) 03/24/2018  . CAP (community acquired pneumonia) 03/24/2018  . Chest pain of uncertain etiology 09/29/2013  . Hypothyroidism 09/29/2013  . Hypercholesterolemia 09/29/2013    Orientation RESPIRATION BLADDER Height & Weight     Self  Normal Incontinent,External catheter Weight:   Height:  5\' 3"  (160 cm)  BEHAVIORAL SYMPTOMS/MOOD NEUROLOGICAL BOWEL NUTRITION STATUS   (No behaviors)   Continent Diet (Please see DC Summary)  AMBULATORY STATUS COMMUNICATION OF NEEDS Skin   Extensive Assist Verbally Normal                       Personal Care Assistance Level of Assistance  Bathing,Feeding,Dressing Bathing Assistance: Maximum assistance Feeding assistance: Maximum assistance Dressing  Assistance: Maximum assistance     Functional Limitations Info             SPECIAL CARE FACTORS FREQUENCY  PT (By licensed PT),OT (By licensed OT)     PT Frequency: 5x/week OT Frequency: 5x/week            Contractures Contractures Info: Not present    Additional Factors Info  Code Status,Allergies,Isolation Precautions Code Status Info: Full Allergies Info: Betadine     Isolation Precautions Info: COVID+     Current Medications (03/09/2020):  This is the current hospital active medication list Current Facility-Administered Medications  Medication Dose Route Frequency Provider Last Rate Last Admin  . acetaminophen (TYLENOL) tablet 650 mg  650 mg Oral Q6H PRN Chotiner, 03/11/2020, MD       Or  . acetaminophen (TYLENOL) suppository 650 mg  650 mg Rectal Q6H PRN Chotiner, Claudean Severance, MD      . acetaminophen (TYLENOL) tablet 650 mg  650 mg Oral TID Rai, Ripudeep K, MD      . aspirin EC tablet 81 mg  81 mg Oral Daily Chotiner, Claudean Severance, MD   81 mg at 03/09/20 0829  . HYDROmorphone (DILAUDID) injection 0.5 mg  0.5 mg Intravenous Q4H PRN Rai, Ripudeep K, MD      . ketorolac (TORADOL) 15 MG/ML injection 15 mg  15 mg Intravenous Q6H Rai, Ripudeep K, MD      . levothyroxine (SYNTHROID) tablet 75 mcg  75 mcg Oral Daily Chotiner, 03/11/20, MD   75 mcg at 03/09/20 0500  . lidocaine (LIDODERM) 5 % 1  patch  1 patch Transdermal Q24H Rai, Ripudeep K, MD      . rosuvastatin (CRESTOR) tablet 10 mg  10 mg Oral Daily Chotiner, Claudean Severance, MD   10 mg at 03/09/20 3734     Discharge Medications: Please see discharge summary for a list of discharge medications.  Relevant Imaging Results:  Relevant Lab Results:   Additional Information [id number] [place], [place]

## 2020-03-10 LAB — CBC
HCT: 38.8 % (ref 36.0–46.0)
Hemoglobin: 13.8 g/dL (ref 12.0–15.0)
MCH: 32.9 pg (ref 26.0–34.0)
MCHC: 35.6 g/dL (ref 30.0–36.0)
MCV: 92.4 fL (ref 80.0–100.0)
Platelets: 125 10*3/uL — ABNORMAL LOW (ref 150–400)
RBC: 4.2 MIL/uL (ref 3.87–5.11)
RDW: 13.7 % (ref 11.5–15.5)
WBC: 7.7 10*3/uL (ref 4.0–10.5)
nRBC: 0 % (ref 0.0–0.2)

## 2020-03-10 LAB — COMPREHENSIVE METABOLIC PANEL
ALT: 28 U/L (ref 0–44)
AST: 52 U/L — ABNORMAL HIGH (ref 15–41)
Albumin: 3.3 g/dL — ABNORMAL LOW (ref 3.5–5.0)
Alkaline Phosphatase: 64 U/L (ref 38–126)
Anion gap: 12 (ref 5–15)
BUN: 12 mg/dL (ref 8–23)
CO2: 23 mmol/L (ref 22–32)
Calcium: 8.6 mg/dL — ABNORMAL LOW (ref 8.9–10.3)
Chloride: 101 mmol/L (ref 98–111)
Creatinine, Ser: 0.81 mg/dL (ref 0.44–1.00)
GFR, Estimated: >60 mL/min (ref >60)
Glucose, Bld: 101 mg/dL — ABNORMAL HIGH (ref 70–99)
Potassium: 5.4 mmol/L — ABNORMAL HIGH (ref 3.5–5.1)
Sodium: 136 mmol/L (ref 135–145)
Total Bilirubin: 1.8 mg/dL — ABNORMAL HIGH (ref 0.3–1.2)
Total Protein: 6 g/dL — ABNORMAL LOW (ref 6.5–8.1)

## 2020-03-10 LAB — D-DIMER, QUANTITATIVE: D-Dimer, Quant: 1.97 ug/mL-FEU — ABNORMAL HIGH (ref 0.00–0.50)

## 2020-03-10 LAB — C-REACTIVE PROTEIN: CRP: 4.6 mg/dL — ABNORMAL HIGH (ref <1.0)

## 2020-03-10 LAB — BRAIN NATRIURETIC PEPTIDE: B Natriuretic Peptide: 155.3 pg/mL — ABNORMAL HIGH (ref 0.0–100.0)

## 2020-03-10 NOTE — Evaluation (Signed)
Occupational Therapy Evaluation Patient Details Name: Jordan Horne MRN: [id number] DOB: Nov 17, 1932 Today's Date: 03/10/2020    History of Present Illness Pt is an 86 y.o. female admitted 03/07/20 after fall down steps hitting the back of her head with no LOC. Pt sustained R 8-9th rib fxs. Incidental (+) COVID-19; pt fully vaccinated. CXR clear. PMH includes mild dementia.   Clinical Impression   Pt admitted with above diagnoses, presenting with cognitive deficits (baseline), generalized weakness, and pain. Unsure of exact PLOF due to pt being poor historian. At time of eval, pt able to complete sit <> stands with mod A and RW and mobilize to the sink in preparation for ADL. Pt then sat in recliner to engage in x3 grooming tasks. She requires max A with hand over hand cues to safely use correct items during grooming. Overall noted marked deficits in initiation, sequencing, command following, and memory. VSS on RA. Noted pt to have anticipated rib pain, but appears comfortable. Given current status, recommend SNF at d/c for continued progression of ADL prior to d/c home. Will continue to follow per POC listed below.     Follow Up Recommendations  SNF;Supervision/Assistance - 24 hour    Equipment Recommendations  Wheelchair (measurements OT);Wheelchair cushion (measurements OT)    Recommendations for Other Services       Precautions / Restrictions Precautions Precautions: Fall Restrictions Weight Bearing Restrictions: No      Mobility Bed Mobility               General bed mobility comments: up in recliner, returned to recliner    Transfers Overall transfer level: Needs assistance Equipment used: Rolling walker (2 wheeled) Transfers: Sit to/from Stand Sit to Stand: Mod assist         General transfer comment: assist to rise and steady and translate wight forward for mobility. Multimodal cues to initiate    Balance Overall balance assessment: Needs  assistance Sitting-balance support: Feet supported Sitting balance-Leahy Scale: Fair     Standing balance support: Bilateral upper extremity supported;During functional activity Standing balance-Leahy Scale: Poor Standing balance comment: Reliant on UE support                           ADL either performed or assessed with clinical judgement   ADL Overall ADL's : Needs assistance/impaired Eating/Feeding: Sitting;Cueing for safety;Cueing for sequencing;Moderate assistance   Grooming: Maximal assistance;Sitting;Cueing for sequencing;Cueing for safety Grooming Details (indicate cue type and reason): max hand over hand cues for appropriate use of tooth brush/toothpaste. Was trying to put toothpaste up nose Upper Body Bathing: Maximal assistance;Sitting   Lower Body Bathing: Maximal assistance;Sitting/lateral leans;Sit to/from stand   Upper Body Dressing : Maximal assistance;Sitting   Lower Body Dressing: Maximal assistance;Sitting/lateral leans;Sit to/from stand   Toilet Transfer: Moderate assistance;RW;Cueing for safety;Cueing for sequencing           Functional mobility during ADLs: Minimal assistance;Rolling walker;Cueing for safety;Cueing for sequencing General ADL Comments: max cues to initiate, sequence, follow commands     Vision Patient Visual Report: No change from baseline Additional Comments: appears to be more inattentive to L Side, but difficult to decipher due to cognitive deficits     Perception     Praxis      Pertinent Vitals/Pain Pain Assessment: Faces Faces Pain Scale: Hurts even more Pain Location: R-side ribs Pain Descriptors / Indicators: Grimacing;Guarding;Moaning Pain Intervention(s): Limited activity within patient's tolerance;Monitored during session;Repositioned     Hand  Dominance     Extremity/Trunk Assessment Upper Extremity Assessment Upper Extremity Assessment: Generalized weakness   Lower Extremity Assessment Lower  Extremity Assessment: Generalized weakness       Communication Communication Communication: No difficulties   Cognition Arousal/Alertness: Awake/alert Behavior During Therapy: WFL for tasks assessed/performed;Restless;Anxious Overall Cognitive Status: History of cognitive impairments - at baseline Area of Impairment: Orientation;Attention;Memory;Following commands;Safety/judgement;Awareness;Problem solving                 Orientation Level: Disoriented to;Place;Time;Situation Current Attention Level: Focused Memory: Decreased recall of precautions;Decreased short-term memory Following Commands: Follows one step commands inconsistently;Follows one step commands with increased time Safety/Judgement: Decreased awareness of safety;Decreased awareness of deficits Awareness: Intellectual Problem Solving: Slow processing;Decreased initiation;Difficulty sequencing;Requires verbal cues;Requires tactile cues General Comments: Pt with marked decrease in initation and problem solving. Pt requires significant increased time and cues to initiate ADLs. When brushing teeth, pt required max hand over hand cues to not put toothpaste in nose, and to sequence brushing teeth appropriately. She was able to appropriately initaite use of comb. Responds best to simple one-two word cues to complete tasks.   General Comments       Exercises     Shoulder Instructions      Home Living Family/patient expects to be discharged to:: Private residence Living Arrangements: Spouse/significant other Available Help at Discharge: Family Type of Home: House                           Additional Comments: Pt poor historian; reports she lives with husband who assists as needed      Prior Functioning/Environment Level of Independence: Needs assistance  Gait / Transfers Assistance Needed: Admitted for fall. Pt reports she uses cane or walker sometimes, prefers cane ADL's / Homemaking Assistance Needed:  Likely requires at least supervision for majority of ADL tasks            OT Problem List: Decreased strength;Decreased knowledge of use of DME or AE;Decreased activity tolerance;Decreased cognition;Impaired balance (sitting and/or standing);Decreased safety awareness;Pain      OT Treatment/Interventions: Self-care/ADL training;Therapeutic exercise;Patient/family education;Balance training;Energy conservation;Therapeutic activities;DME and/or AE instruction;Cognitive remediation/compensation    OT Goals(Current goals can be found in the care plan section) Acute Rehab OT Goals OT Goal Formulation: Patient unable to participate in goal setting Time For Goal Achievement: 03/24/20 Potential to Achieve Goals: Good  OT Frequency: Min 2X/week   Barriers to D/C:            Co-evaluation              AM-PAC OT "6 Clicks" Daily Activity     Outcome Measure Help from another person eating meals?: A Lot Help from another person taking care of personal grooming?: A Lot Help from another person toileting, which includes using toliet, bedpan, or urinal?: Total Help from another person bathing (including washing, rinsing, drying)?: A Lot Help from another person to put on and taking off regular upper body clothing?: A Lot Help from another person to put on and taking off regular lower body clothing?: Total 6 Click Score: 10   End of Session Equipment Utilized During Treatment: Gait belt;Rolling walker Nurse Communication: Mobility status  Activity Tolerance: Patient tolerated treatment well Patient left: in chair;with call bell/phone within reach;with chair alarm set  OT Visit Diagnosis: Unsteadiness on feet (R26.81);Other abnormalities of gait and mobility (R26.89);Muscle weakness (generalized) (M62.81);Other symptoms and signs involving cognitive function  Time: 1131-1202 OT Time Calculation (min): 31 min Charges:  OT General Charges $OT Visit: 1 Visit OT  Evaluation $OT Eval Moderate Complexity: 1 Mod  Dalphine Handing, MSOT, OTR/L Acute Rehabilitation Services [facility] Office Number: [phone] Pager: [phone]  Dalphine Handing 03/10/2020, 1:11 PM

## 2020-03-10 NOTE — TOC Progression Note (Signed)
Transition of Care [facility]) - Progression Note    Patient Details  Name: MICHALA DEBLANC MRN: [id number] Date of Birth: 1932-11-24  [facility] of [facility]) CM/SW Contact  Mearl Latin, LCSW Phone Number: 03/10/2020, 12:10 PM  Clinical Narrative:    CSW spoke with [facility] and they are hoping to accept patient on Monday when a bed becomes available. CSW updated patient's spouse and he is agreeable to plan.    Expected Discharge Plan: Skilled Nursing Facility Barriers to Discharge: SNF Pending bed offer,Continued Medical Work up  Expected Discharge Plan and Services Expected Discharge Plan: Skilled Nursing Facility   Discharge Planning Services: CM Consult Post Acute Care Choice: Skilled Nursing Facility Living arrangements for the past 2 months: Single Family Home                           HH Arranged: PT,OT,Nurse's Aide,Social Work [employer] Agency: [facility] Date [facility] Agency Contacted: 03/09/20 Time HH Agency Contacted: 1054 Representative spoke with at [facility] Agency: Wayne Both   Social Determinants of Health (SDOH) Interventions    Readmission Risk Interventions No flowsheet data found.

## 2020-03-10 NOTE — Progress Notes (Signed)
Skilled Nursing Rehab Facilities-   https://www.medicare.gov/care-compare/    Ratings out of 5 possible   Name          Address       Phone #     Quality Care                                       Staffing-Health-Inspection-Overall [facility]-[address], [place], [phone].     3 2 2 2 [facility]- [address], [place], [phone].          3 2 2 2 [facility]-[address], [place], [phone].   1     2         1          1   

## 2020-03-10 NOTE — Plan of Care (Signed)
  Problem: Clinical Measurements: Goal: Ability to maintain clinical measurements within normal limits will improve Outcome: Progressing Goal: Diagnostic test results will improve Outcome: Progressing Goal: Respiratory complications will improve Outcome: Progressing Goal: Cardiovascular complication will be avoided Outcome: Progressing   Problem: Nutrition: Goal: Adequate nutrition will be maintained Outcome: Progressing   Problem: Elimination: Goal: Will not experience complications related to bowel motility Outcome: Progressing Goal: Will not experience complications related to urinary retention Outcome: Progressing   Problem: Pain Managment: Goal: General experience of comfort will improve Outcome: Progressing   Problem: Skin Integrity: Goal: Risk for impaired skin integrity will decrease Outcome: Progressing   Problem: Respiratory: Goal: Will maintain a patent airway Outcome: Progressing Goal: Complications related to the disease process, condition or treatment will be avoided or minimized Outcome: Progressing   Problem: Education: Goal: Knowledge of General Education information will improve Description: Including pain rating scale, medication(s)/side effects and non-pharmacologic comfort measures Outcome: Not Progressing   Problem: Health Behavior/Discharge Planning: Goal: Ability to manage health-related needs will improve Outcome: Not Progressing   Problem: Activity: Goal: Risk for activity intolerance will decrease Outcome: Not Progressing   Problem: Coping: Goal: Level of anxiety will decrease Outcome: Not Progressing   Problem: Safety: Goal: Ability to remain free from injury will improve Outcome: Not Progressing   Problem: Education: Goal: Knowledge of risk factors and measures for prevention of condition will improve Outcome: Not Progressing

## 2020-03-10 NOTE — Progress Notes (Signed)
Physical Therapy Treatment Patient Details Name: Jordan Horne MRN: [id number] DOB: [birth year] Today's Date: 03/10/2020    History of Present Illness Pt is an 86 y.o. female admitted 03/07/20 after fall down steps hitting the back of her head with no LOC. Pt sustained R 8-9th rib fxs. Incidental (+) COVID-19; pt fully vaccinated. CXR clear. PMH includes mild dementia.   PT Comments    Pt slowly progressing with mobility. Noted increased fatigue and decreased initiation this session compared to evaluation, but pt with fewer c/o pain and demonstrates increased activity tolerance. Pt required consistent modA for standing and min-modA for short ambulation distance with RW. Pt also with difficulty sequencing, following commands and impaired memory, as well as generalized weakness. Recommend SNF-level therapies to maximize functional mobility and independence prior to return home.  SpO2 98-100% on RA, HR 87-106, post-mobility BP 156/84    Follow Up Recommendations  SNF;Supervision/Assistance - 24 hour     Equipment Recommendations  3in1 (PT);Wheelchair (measurements PT);Wheelchair cushion (measurements PT)    Recommendations for Other Services       Precautions / Restrictions Precautions Precautions: Fall Restrictions Weight Bearing Restrictions: No    Mobility  Bed Mobility               General bed mobility comments: Seated in recliner  Transfers Overall transfer level: Needs assistance Equipment used: Rolling walker (2 wheeled) Transfers: Sit to/from Stand Sit to Stand: Mod assist         General transfer comment: ModA for trunk elevation and steadying assist, multimodal cues and significant increased time to initiate movement  Ambulation/Gait Ambulation/Gait assistance: Min assist;Min guard;Mod assist Gait Distance (Feet): 18 Feet Assistive device: Rolling walker (2 wheeled) Gait Pattern/deviations: Step-to pattern;Shuffle;Trunk flexed;Antalgic Gait  velocity: Decreased   General Gait Details: Slow, tremulous, fatigued gait with RW and intermittent minA for balance; pt requiring frequent cues for attention and sequencing; modA for RW navigation to redirect; pt running into computer requiring max cues for direction   Stairs             Wheelchair Mobility    Modified Rankin (Stroke Patients Only)       Balance Overall balance assessment: Needs assistance Sitting-balance support: Feet supported Sitting balance-Leahy Scale: Fair     Standing balance support: Bilateral upper extremity supported;During functional activity Standing balance-Leahy Scale: Poor Standing balance comment: Reliant on UE support                            Cognition Arousal/Alertness: Awake/alert Behavior During Therapy: WFL for tasks assessed/performed;Anxious Overall Cognitive Status: History of cognitive impairments - at baseline Area of Impairment: Orientation;Attention;Memory;Following commands;Safety/judgement;Awareness;Problem solving                 Orientation Level: Disoriented to;Place;Time;Situation Current Attention Level: Focused Memory: Decreased recall of precautions;Decreased short-term memory Following Commands: Follows one step commands inconsistently;Follows one step commands with increased time Safety/Judgement: Decreased awareness of safety;Decreased awareness of deficits Awareness: Intellectual Problem Solving: Slow processing;Decreased initiation;Difficulty sequencing;Requires verbal cues;Requires tactile cues General Comments: Pt with marked decrease in initation and problem solving. Pt requires significant increased time and cues to initiate ADLs. When brushing teeth, pt required max hand over hand cues to not put toothpaste in nose, and to sequence brushing teeth appropriately. She was able to appropriately initaite use of comb. Responds best to simple one-two word cues to complete tasks.       Exercises  General Comments General comments (skin integrity, edema, etc.): SpO2 98-100% on RA; HR 87-106; post-ambulation BP 156/84      Pertinent Vitals/Pain Pain Assessment: Faces Faces Pain Scale: Hurts little more Pain Location: R-side ribs, generalized Pain Descriptors / Indicators: Grimacing;Guarding;Moaning;Tiring Pain Intervention(s): Monitored during session;Limited activity within patient's tolerance;Repositioned    Home Living Family/patient expects to be discharged to:: Private residence Living Arrangements: Spouse/significant other Available Help at Discharge: Family Type of Home: House         Additional Comments: Pt poor historian; reports she lives with husband who assists as needed    Prior Function Level of Independence: Needs assistance  Gait / Transfers Assistance Needed: Admitted for fall. Pt reports she uses cane or walker sometimes, prefers cane ADL's / Homemaking Assistance Needed: Likely requires at least supervision for majority of ADL tasks     PT Goals (current goals can now be found in the care plan section) Progress towards PT goals: Progressing toward goals    Frequency    Min 2X/week      PT Plan Frequency needs to be updated    Co-evaluation              AM-PAC PT "6 Clicks" Mobility   Outcome Measure  Help needed turning from your back to your side while in a flat bed without using bedrails?: A Little Help needed moving from lying on your back to sitting on the side of a flat bed without using bedrails?: A Little Help needed moving to and from a bed to a chair (including a wheelchair)?: A Lot Help needed standing up from a chair using your arms (e.g., wheelchair or bedside chair)?: A Lot Help needed to walk in hospital room?: A Lot Help needed climbing 3-5 steps with a railing? : A Lot 6 Click Score: 14    End of Session Equipment Utilized During Treatment: Gait belt Activity Tolerance: Patient tolerated treatment  well;Patient limited by fatigue Patient left: in chair;with call bell/phone within reach;with chair alarm set;with nursing/sitter in room Nurse Communication: Mobility status PT Visit Diagnosis: Other abnormalities of gait and mobility (R26.89);Pain;Muscle weakness (generalized) (M62.81)     Time: 8469-6295 PT Time Calculation (min) (ACUTE ONLY): 31 min  Charges:  $Therapeutic Activity: 8-22 mins                     [name], PT, DPT Acute Rehabilitation Services  Pager [phone] Office [phone]  [name] 03/10/2020, 1:24 PM

## 2020-03-10 NOTE — Progress Notes (Signed)
[facility]                                                                              Patient Demographics  Jordan Horne, is a 86 y.o. female, DOB - Oct 05, 1932, [id number]  Admit date - 03/07/2020   Admitting Physician Keyonta [place] Jenna Luo, MD  Outpatient Primary MD for the patient is Dema Severin, NP  Outpatient specialists:   LOS - 2  days   Medical records reviewed and are as summarized below:    chief complain: Fall with moderate to severe pain in the back  Brief summary   Jordan Horne is a 86 y.o. female with medical history significant for mild dementia, HLD, hypothyroidism who presents after a fall at home. She was walking at home today when she fell down 3 steps at about 4:30 PM. Patient states she just tripped and fell. Patient states she hit the back of her head and landed on her right side. She is having moderate to severe pain in the back of her head and neck. Patient states she is having pain also on the right side of her ribs. She did not take anything for the pain at home.She denies any shortness of breath. No abdominal pain. She did not have LOC. No seizure activity reported.  Per husband, patient has completed both Covid vaccinations x2 and had received booster last week as well.   Assessment & Plan    Principal Problem:   Multiple fractures of ribs, right side, initial encounter for closed fracture -Secondary to mechanical fall at home, fractures of right eighth, ninth ribs with intractable pain -Lumbar spine x-ray negative for any fractures -PT evaluation recommended SNF versus home health PT with 24/7 support.   -More alert and oriented today, off of hydrocodone, Dilaudid decreased.  For now continue scheduled Tylenol, Lidoderm patch and Tylenol.   -Family requested SNF, social work consult placed  Active Problems:  COVID-19 positive -Incidental COVID-19 positive on admission. Has pleuritic chest pain from the fractures of  right eighth ninth ribs.  Had vaccination x2, booster last week.   -Continue pain control incentive spirometry, O2 sats 98% on room air -Chest x-ray clear on 12/14 -Per pharmacy, does not meet criteria for Mab infusion due to partially or fully vaccinated for COVID-19, asymptomatic with a cycle time> 32 -Monitor O2 sats, currently 100% on room air, asymptomatic.  Had brief transient hypoxia while examining the patient, spontaneously recovered   Hyperlipidemia Continue statin  Hypothyroidism Continue Synthroid  Acute metabolic encephalopathy Likely due to narcotics, much more alert and awake, likely has underlying dementia/sundowning CT head on admission showed no acute intracranial abnormality, atrophy and mild chronic small vessel ischemic changes of the white matter   Code Status: Full CODE STATUS DVT Prophylaxis:  Lovenox  Family Communication: Discussed all imaging results, lab results, explained to the patient and patient's husband on the phone on 12/15 and 12/16  Disposition Plan:     Status is: Inpatient  The patient will require care spanning > 2 midnights and Dispo: The patient is from: Home  Anticipated d/c is to: SNF              Anticipated d/c date is: 2 days              Patient currently is not medically stable to d/c.   Awaiting SNF     Time Spent in minutes 25 minutes  Procedures:  None Consultants:   None  Antimicrobials:   Anti-infectives (From admission, onward)   None         Medications  Scheduled Meds: . acetaminophen  650 mg Oral TID  . aspirin EC  81 mg Oral Daily  . ketorolac  15 mg Intravenous Q6H  . levothyroxine  75 mcg Oral Daily  . lidocaine  1 patch Transdermal Q24H  . rosuvastatin  10 mg Oral Daily   Continuous Infusions:  PRN Meds:.acetaminophen **OR** acetaminophen, HYDROmorphone (DILAUDID) injection      Subjective:   Jordan Horne was seen and examined today.  Sitting up in the chair, much more  alert and awake, pain controlled.  No acute issues overnight.  No fevers.  Patient denies dizziness,  shortness of breath, abdominal pain, nausea vomiting.  Objective:   Vitals:   03/09/20 2053 03/10/20 0233 03/10/20 0518 03/10/20 0715  BP: 138/80  (!) 147/99 140/84  Pulse: 92  92 90  Resp: 13  17 18   Temp: 97.9 F (36.6 C)  97.7 F (36.5 C) 98.8 F (37.1 C)  TempSrc: Oral  Oral Oral  SpO2: 95%  98% 98%  Weight:  84.4 kg    Height:        Intake/Output Summary (Last 24 hours) at 03/10/2020 1320 Last data filed at 03/10/2020 1048 Gross per 24 hour  Intake 870 ml  Output 800 ml  Net 70 ml     Wt Readings from Last 3 Encounters:  03/10/20 84.4 kg  12/02/19 72.2 kg  09/09/19 73.5 kg   Physical Exam  General: Fairly alert and awake today, NAD, oriented to self and place   Cardiovascular: S1 S2 clear, RRR. No pedal edema b/l  Respiratory: CTAB, no wheezing, rales or rhonchi  Gastrointestinal: Soft, nontender, nondistended, NBS  Ext: no pedal edema bilaterally  Neuro: no new deficits  Musculoskeletal: No cyanosis, clubbing  Skin: No rashes  Psych: still somewhat confused, has likely underlying dementia or sundowning    Data Reviewed:  I have personally reviewed following labs and imaging studies  Micro Results Recent Results (from the past 240 hour(s))  Resp Panel by RT-PCR (Flu A&B, Covid) Nasopharyngeal Swab     Status: Abnormal   Collection Time: 03/07/20 11:08 PM   Specimen: Nasopharyngeal Swab; Nasopharyngeal(NP) swabs in vial transport medium  Result Value Ref Range Status   SARS Coronavirus 2 by RT PCR POSITIVE (A) NEGATIVE Final    Comment: RESULT CALLED TO, READ BACK BY AND VERIFIED WITH: 03/09/20, A RN 03/08/20 at 1224 sk (NOTE) SARS-CoV-2 target nucleic acids are DETECTED.  The SARS-CoV-2 RNA is generally detectable in upper respiratory specimens during the acute phase of infection. Positive results are indicative of the presence of the  identified virus, but do not rule out bacterial infection or co-infection with other pathogens not detected by the test. Clinical correlation with patient history and other diagnostic information is necessary to determine patient infection status. The expected result is Negative.  Fact Sheet for Patients: 03/10/20  Fact Sheet for Healthcare Providers: BloggerCourse.com  This test is not yet approved or cleared by the [employer] and  has been authorized for detection and/or diagnosis of SARS-CoV-2 by FDA under an Emergency Use Authorization (EUA).  This EUA will remain in effect (meaning this test can be  used) for the duration of  the COVID-19 declaration under Section 564(b)(1) of the Act, 21 U.S.C. section 360bbb-3(b)(1), unless the authorization is terminated or revoked sooner.     Influenza A by PCR NEGATIVE NEGATIVE Final   Influenza B by PCR NEGATIVE NEGATIVE Final    Comment: (NOTE) The Xpert Xpress SARS-CoV-2/FLU/RSV plus assay is intended as an aid in the diagnosis of influenza from Nasopharyngeal swab specimens and should not be used as a sole basis for treatment. Nasal washings and aspirates are unacceptable for Xpert Xpress SARS-CoV-2/FLU/RSV testing.  Fact Sheet for Patients: BloggerCourse.com  Fact Sheet for Healthcare Providers: [employer]it  This test is not yet approved or cleared by the [place] FDA and has been authorized for detection and/or diagnosis of SARS-CoV-2 by FDA under an Emergency Use Authorization (EUA). This EUA will remain in effect (meaning this test can be used) for the duration of the COVID-19 declaration under Section 564(b)(1) of the Act, 21 U.S.C. section 360bbb-3(b)(1), unless the authorization is terminated or revoked.  Performed at [facility], [address], [place], [place] [address]     Radiology  Reports DG Ribs Unilateral W/Chest Right  Result Date: 03/07/2020 CLINICAL DATA:  Fall, right chest pain EXAM: RIGHT RIBS AND CHEST - 3+ VIEW COMPARISON:  None. FINDINGS: Single view radiograph of the chest and two view radiograph of the right ribs demonstrates acute minimally displaced fractures of the right eighth and ninth ribs posteriorly. Minimal left basilar atelectasis or infiltrate. Lungs are otherwise clear. No pneumothorax or pleural effusion. Cardiac size is at the upper limits of normal. Pulmonary vascularity is normal. IMPRESSION: Acute minimally displaced right 8, 9 rib fractures. No pneumothorax. Electronically Signed   By: Helyn Numbers MD   On: 03/07/2020 20:05   DG Thoracic Spine 2 View  Result Date: 03/07/2020 CLINICAL DATA:  Fall, back pain EXAM: THORACIC SPINE 2 VIEWS COMPARISON:  04/24/2018 FINDINGS: Two view radiograph thoracic spine demonstrates normal thoracic kyphosis. Mild thoracic dextroscoliosis, apex right at T5, unchanged. No acute fracture or listhesis of the thoracic spine. Vertebral body height has been preserved. There is diffuse intervertebral disc space narrowing and endplate remodeling throughout the thoracic spine in keeping with changes of diffuse severe degenerative disc disease. The paraspinal soft tissues are unremarkable. Incidental note is made of a small hiatal hernia. IMPRESSION: No acute fracture or listhesis. Electronically Signed   By: Helyn Numbers MD   On: 03/07/2020 20:02   DG Lumbar Spine Complete  Result Date: 03/07/2020 CLINICAL DATA:  Fall, back pain EXAM: LUMBAR SPINE - COMPLETE 4+ VIEW COMPARISON:  None. FINDINGS: Five view radiograph lumbar spine. Normal lumbar lordosis. No acute fracture or listhesis of the lumbar spine. Vertebral body height has been preserved. There is diffuse severe intervertebral disc space narrowing and endplate remodeling with multilevel vacuum disc phenomena in keeping with changes of diffuse severe degenerative disc  disease throughout the lumbar spine. Oblique views demonstrate no evidence of pars defect. Paraspinal soft tissues are unremarkable. IMPRESSION: No acute fracture or listhesis. Electronically Signed   By: Helyn Numbers MD   On: 03/07/2020 20:06   CT Head Wo Contrast  Result Date: 03/07/2020 CLINICAL DATA:  Head trauma EXAM: CT HEAD WITHOUT CONTRAST TECHNIQUE: Contiguous axial images were obtained from the base of the skull through the vertex  without intravenous contrast. COMPARISON:  CT brain 08/12/2019 FINDINGS: Brain: No acute territorial infarction, hemorrhage, or intracranial mass. Mild atrophy. Mild chronic small vessel ischemic changes of the white matter. Chronic appearing lacunar infarcts in the basal ganglia. Stable punctate calcifications in the left periventricular white matter. Stable ventricle size. Vascular: No hyperdense vessels.  Carotid vascular calcification Skull: Normal. Negative for fracture or focal lesion. Sinuses/Orbits: Mild mucosal thickening in the sinuses Other: None IMPRESSION: 1. No CT evidence for acute intracranial abnormality. 2. Atrophy and mild chronic small vessel ischemic changes of the white matter. Electronically Signed   By: Jasmine Pang M.D.   On: 03/07/2020 20:21   CT Cervical Spine Wo Contrast  Result Date: 03/07/2020 CLINICAL DATA:  Trauma EXAM: CT CERVICAL SPINE WITHOUT CONTRAST TECHNIQUE: Multidetector CT imaging of the cervical spine was performed without intravenous contrast. Multiplanar CT image reconstructions were also generated. COMPARISON:  08/12/2019 FINDINGS: Alignment: Stable alignment with trace retrolisthesis C5 on C6 and trace anterolisthesis C6 on C7. Facet alignment is maintained. Skull base and vertebrae: No acute fracture. No primary bone lesion or focal pathologic process. Soft tissues and spinal canal: No prevertebral fluid or swelling. No visible canal hematoma. Disc levels: Multiple level degenerative changes with moderate to marked  disease at C4-C5 and C5-C6. Facet degenerative change at multiple levels. Upper chest: Negative.  No appreciable thyroid tissue identified. Other: None IMPRESSION: Stable alignment of the cervical spine with degenerative changes. No acute osseous abnormality. Electronically Signed   By: Jasmine Pang M.D.   On: 03/07/2020 20:26    Lab Data:  CBC: Recent Labs  Lab 03/07/20 2059 03/08/20 0500 03/09/20 0254 03/10/20 0720  WBC 10.0 6.0 7.5 7.7  NEUTROABS 7.3  --   --   --   HGB 13.2 12.2 13.0 13.8  HCT 41.4 36.8 37.6 38.8  MCV 99.8 98.9 94.7 92.4  PLT 140* 117* 110* 125*   Basic Metabolic Panel: Recent Labs  Lab 03/07/20 2059 03/08/20 0500 03/09/20 0254 03/10/20 0053  NA 140 141 139 136  K 4.6 3.6 3.3* 5.4*  CL 107 107 105 101  CO2 25 27 23 23   GLUCOSE 101* 87 106* 101*  BUN 13 11 8 12   CREATININE 1.07* 0.93 0.77 0.81  CALCIUM 8.6* 8.5* 8.8* 8.6*   GFR: Estimated Creatinine Clearance: 52.3 mL/min (by C-G formula based on SCr of 0.81 mg/dL). Liver Function Tests: Recent Labs  Lab 03/09/20 0254 03/10/20 0053  AST 27 52*  ALT 27 28  ALKPHOS 66 64  BILITOT 1.0 1.8*  PROT 5.8* 6.0*  ALBUMIN 3.2* 3.3*   No results for input(s): LIPASE, AMYLASE in the last 168 hours. No results for input(s): AMMONIA in the last 168 hours. Coagulation Profile: No results for input(s): INR, PROTIME in the last 168 hours. Cardiac Enzymes: No results for input(s): CKTOTAL, CKMB, CKMBINDEX, TROPONINI in the last 168 hours. BNP (last 3 results) No results for input(s): PROBNP in the last 8760 hours. HbA1C: No results for input(s): HGBA1C in the last 72 hours. CBG: No results for input(s): GLUCAP in the last 168 hours. Lipid Profile: No results for input(s): CHOL, HDL, LDLCALC, TRIG, CHOLHDL, LDLDIRECT in the last 72 hours. Thyroid Function Tests: No results for input(s): TSH, T4TOTAL, FREET4, T3FREE, THYROIDAB in the last 72 hours. Anemia Panel: Recent Labs    03/08/20 1529   FERRITIN 60   Urine analysis:    Component Value Date/Time   COLORURINE YELLOW 08/12/2019 1606   APPEARANCEUR CLOUDY (A) 08/12/2019 1606  LABSPEC 1.018 08/12/2019 1606   PHURINE 9.0 (H) 08/12/2019 1606   GLUCOSEU NEGATIVE 08/12/2019 1606   HGBUR NEGATIVE 08/12/2019 1606   BILIRUBINUR NEGATIVE 08/12/2019 1606   KETONESUR NEGATIVE 08/12/2019 1606   PROTEINUR NEGATIVE 08/12/2019 1606   UROBILINOGEN 0.2 12/09/2009 0006   NITRITE NEGATIVE 08/12/2019 1606   LEUKOCYTESUR NEGATIVE 08/12/2019 1606     Fausto Sampedro M.D. [facility] 03/10/2020, 1:20 PM   Call night coverage person covering after 7pm

## 2020-03-11 LAB — COMPREHENSIVE METABOLIC PANEL
ALT: 22 U/L (ref 0–44)
AST: 22 U/L (ref 15–41)
Albumin: 3.2 g/dL — ABNORMAL LOW (ref 3.5–5.0)
Alkaline Phosphatase: 57 U/L (ref 38–126)
Anion gap: 10 (ref 5–15)
BUN: 12 mg/dL (ref 8–23)
CO2: 21 mmol/L — ABNORMAL LOW (ref 22–32)
Calcium: 8.5 mg/dL — ABNORMAL LOW (ref 8.9–10.3)
Chloride: 102 mmol/L (ref 98–111)
Creatinine, Ser: 0.77 mg/dL (ref 0.44–1.00)
GFR, Estimated: >60 mL/min (ref >60)
Glucose, Bld: 103 mg/dL — ABNORMAL HIGH (ref 70–99)
Potassium: 3.6 mmol/L (ref 3.5–5.1)
Sodium: 133 mmol/L — ABNORMAL LOW (ref 135–145)
Total Bilirubin: 1.1 mg/dL (ref 0.3–1.2)
Total Protein: 6.1 g/dL — ABNORMAL LOW (ref 6.5–8.1)

## 2020-03-11 LAB — D-DIMER, QUANTITATIVE: D-Dimer, Quant: 2.25 ug/mL-FEU — ABNORMAL HIGH (ref 0.00–0.50)

## 2020-03-11 LAB — C-REACTIVE PROTEIN: CRP: 3.3 mg/dL — ABNORMAL HIGH (ref <1.0)

## 2020-03-11 LAB — CBC
HCT: 37 % (ref 36.0–46.0)
Hemoglobin: 13.1 g/dL (ref 12.0–15.0)
MCH: 32.7 pg (ref 26.0–34.0)
MCHC: 35.4 g/dL (ref 30.0–36.0)
MCV: 92.3 fL (ref 80.0–100.0)
Platelets: 121 10*3/uL — ABNORMAL LOW (ref 150–400)
RBC: 4.01 MIL/uL (ref 3.87–5.11)
RDW: 13.6 % (ref 11.5–15.5)
WBC: 8.1 10*3/uL (ref 4.0–10.5)
nRBC: 0 % (ref 0.0–0.2)

## 2020-03-11 LAB — BRAIN NATRIURETIC PEPTIDE: B Natriuretic Peptide: 106.3 pg/mL — ABNORMAL HIGH (ref 0.0–100.0)

## 2020-03-11 NOTE — Progress Notes (Signed)
[facility]                                                                              Patient Demographics  Jordan Horne, is a 86 y.o. female, DOB - 08/18/1932, XNT:[id number]  Admit date - 03/07/2020   Admitting Physician Donevin Sainsbury Jenna Luo, MD  Outpatient Primary MD for the patient is Dema Severin, NP  Outpatient specialists:   LOS - 3  days   Medical records reviewed and are as summarized below:    chief complain: Fall with moderate to severe pain in the back  Brief summary   Jordan Horne is a 86 y.o. female with medical history significant for mild dementia, HLD, hypothyroidism who presents after a fall at home. She was walking at home today when she fell down 3 steps at about 4:30 PM. Patient states she just tripped and fell. Patient states she hit the back of her head and landed on her right side. She is having moderate to severe pain in the back of her head and neck. Patient states she is having pain also on the right side of her ribs. She did not take anything for the pain at home.She denies any shortness of breath. No abdominal pain. She did not have LOC. No seizure activity reported.  Per husband, patient has completed both Covid vaccinations x2 and had received booster last week as well.   Assessment & Plan    Principal Problem:   Multiple fractures of ribs, right side, initial encounter for closed fracture -Secondary to mechanical fall at home, fractures of right eighth, ninth ribs with intractable pain -Lumbar spine x-ray negative for any fractures -PT evaluation recommended SNF versus home health PT with 24/7 support.  Family requested SNF, social work consult placed -Pain currently controlled with scheduled Tylenol, Lidoderm patch and low-dose IV Dilaudid.  Active Problems:  COVID-19 positive -Incidental COVID-19 positive on admission. Has pleuritic chest pain from the fractures of right eighth ninth ribs.  Had vaccination x2,  booster last week.   -Continue pain control incentive spirometry, O2 sats 98% on room air -Chest x-ray clear on 12/14 -Per pharmacy, does not meet criteria for Mab infusion due to partially or fully vaccinated for COVID-19, asymptomatic with a cycle time> 32 -Currently stable, O2 sats 99% on room air, asymptomatic   Hyperlipidemia Continue statin  Hypothyroidism Continue Synthroid  Acute metabolic encephalopathy - Likely due to narcotics, also likely has underlying dementia with sundowning -Resolved, much more alert and awake, oriented now - CT head on admission showed no acute intracranial abnormality, atrophy and mild chronic small vessel ischemic changes of the white matter   Code Status: Full CODE STATUS DVT Prophylaxis:  Lovenox  Family Communication: Discussed all imaging results, lab results, explained to the patient and patient's husband on the phone on 12/16, awaiting skilled nursing facility  Disposition Plan:     Status is: Inpatient  The patient will require care spanning > 2 midnights and Dispo: The patient is from: Home              Anticipated d/c is to: SNF  Anticipated d/c date is: 2 days              Patient currently is not medically stable to d/c.   Awaiting SNF, likely Monday     Time Spent in minutes 25 minutes  Procedures:  None Consultants:   None  Antimicrobials:   Anti-infectives (From admission, onward)   None         Medications  Scheduled Meds: . acetaminophen  650 mg Oral TID  . aspirin EC  81 mg Oral Daily  . ketorolac  15 mg Intravenous Q6H  . levothyroxine  75 mcg Oral Daily  . lidocaine  1 patch Transdermal Q24H  . rosuvastatin  10 mg Oral Daily   Continuous Infusions:  PRN Meds:.acetaminophen **OR** acetaminophen, HYDROmorphone (DILAUDID) injection      Subjective:   Jordan Horne was seen and examined today.  Sitting up in the chair, no acute complaints, much more alert and oriented today.  States  pain is controlled.  No shortness of breath or chest pain, no fevers.  No acute events overnight.   Objective:   Vitals:   03/10/20 1401 03/10/20 2117 03/11/20 0511 03/11/20 1304  BP: (!) 138/50 (!) 150/98 (!) 171/95 (!) 168/71  Pulse: 93 92 87 92  Resp: Temp: 98 F (36.7 C) 98.3 F (36.8 C) 98.3 F (36.8 C) 98.5 F (36.9 C)  TempSrc: Oral Oral Oral Oral  SpO2: 100% 95% 98% 99%  Weight:      Height:        Intake/Output Summary (Last 24 hours) at 03/11/2020 1410 Last data filed at 03/11/2020 1116 Gross per 24 hour  Intake 470 ml  Output 500 ml  Net -30 ml     Wt Readings from Last 3 Encounters:  03/10/20 84.4 kg  12/02/19 72.2 kg  09/09/19 73.5 kg   Physical Exam  General: Alert and oriented x 2, self and place, improving from yesterday  Cardiovascular: S1 S2 clear, RRR. No pedal edema b/l  Respiratory: CTAB, no wheezing, rales or rhonchi  Gastrointestinal: Soft, nontender, nondistended, NBS  Ext: no pedal edema bilaterally  Neuro: no new deficits  Musculoskeletal: No cyanosis, clubbing  Skin: No rashes  Psych: fairly alert and oriented today     Data Reviewed:  I have personally reviewed following labs and imaging studies  Micro Results Recent Results (from the past 240 hour(s))  Resp Panel by RT-PCR (Flu A&B, Covid) Nasopharyngeal Swab     Status: Abnormal   Collection Time: 03/07/20 11:08 PM   Specimen: Nasopharyngeal Swab; Nasopharyngeal(NP) swabs in vial transport medium  Result Value Ref Range Status   SARS Coronavirus 2 by RT PCR POSITIVE (A) NEGATIVE Final    Comment: RESULT CALLED TO, READ BACK BY AND VERIFIED WITH: Laurice Record, A RN 03/08/20 at 1224 sk (NOTE) SARS-CoV-2 target nucleic acids are DETECTED.  The SARS-CoV-2 RNA is generally detectable in upper respiratory specimens during the acute phase of infection. Positive results are indicative of the presence of the identified virus, but do not rule out bacterial  infection or co-infection with other pathogens not detected by the test. Clinical correlation with patient history and other diagnostic information is necessary to determine patient infection status. The expected result is Negative.  Fact Sheet for Patients: BloggerCourse.com  Fact Sheet for Healthcare Providers: SeriousBroker.it  This test is not yet approved or cleared by the [place] FDA and  has been authorized for detection and/or diagnosis of SARS-CoV-2 by FDA  under an Emergency Use Authorization (EUA).  This EUA will remain in effect (meaning this test can be  used) for the duration of  the COVID-19 declaration under Section 564(b)(1) of the Act, 21 U.S.C. section 360bbb-3(b)(1), unless the authorization is terminated or revoked sooner.     Influenza A by PCR NEGATIVE NEGATIVE Final   Influenza B by PCR NEGATIVE NEGATIVE Final    Comment: (NOTE) The Xpert Xpress SARS-CoV-2/FLU/RSV plus assay is intended as an aid in the diagnosis of influenza from Nasopharyngeal swab specimens and should not be used as a sole basis for treatment. Nasal washings and aspirates are unacceptable for Xpert Xpress SARS-CoV-2/FLU/RSV testing.  Fact Sheet for Patients: BloggerCourse.com  Fact Sheet for Healthcare Providers: SeriousBroker.it  This test is not yet approved or cleared by the [place] FDA and has been authorized for detection and/or diagnosis of SARS-CoV-2 by FDA under an Emergency Use Authorization (EUA). This EUA will remain in effect (meaning this test can be used) for the duration of the COVID-19 declaration under Section 564(b)(1) of the Act, 21 U.S.C. section 360bbb-3(b)(1), unless the authorization is terminated or revoked.  Performed at [facility], [address], [place], [place] [address]     Radiology Reports DG Ribs Unilateral W/Chest  Right  Result Date: 03/07/2020 CLINICAL DATA:  Fall, right chest pain EXAM: RIGHT RIBS AND CHEST - 3+ VIEW COMPARISON:  None. FINDINGS: Single view radiograph of the chest and two view radiograph of the right ribs demonstrates acute minimally displaced fractures of the right eighth and ninth ribs posteriorly. Minimal left basilar atelectasis or infiltrate. Lungs are otherwise clear. No pneumothorax or pleural effusion. Cardiac size is at the upper limits of normal. Pulmonary vascularity is normal. IMPRESSION: Acute minimally displaced right 8, 9 rib fractures. No pneumothorax. Electronically Signed   By: Helyn Numbers MD   On: 03/07/2020 20:05   DG Thoracic Spine 2 View  Result Date: 03/07/2020 CLINICAL DATA:  Fall, back pain EXAM: THORACIC SPINE 2 VIEWS COMPARISON:  04/24/2018 FINDINGS: Two view radiograph thoracic spine demonstrates normal thoracic kyphosis. Mild thoracic dextroscoliosis, apex right at T5, unchanged. No acute fracture or listhesis of the thoracic spine. Vertebral body height has been preserved. There is diffuse intervertebral disc space narrowing and endplate remodeling throughout the thoracic spine in keeping with changes of diffuse severe degenerative disc disease. The paraspinal soft tissues are unremarkable. Incidental note is made of a small hiatal hernia. IMPRESSION: No acute fracture or listhesis. Electronically Signed   By: Helyn Numbers MD   On: 03/07/2020 20:02   DG Lumbar Spine Complete  Result Date: 03/07/2020 CLINICAL DATA:  Fall, back pain EXAM: LUMBAR SPINE - COMPLETE 4+ VIEW COMPARISON:  None. FINDINGS: Five view radiograph lumbar spine. Normal lumbar lordosis. No acute fracture or listhesis of the lumbar spine. Vertebral body height has been preserved. There is diffuse severe intervertebral disc space narrowing and endplate remodeling with multilevel vacuum disc phenomena in keeping with changes of diffuse severe degenerative disc disease throughout the lumbar  spine. Oblique views demonstrate no evidence of pars defect. Paraspinal soft tissues are unremarkable. IMPRESSION: No acute fracture or listhesis. Electronically Signed   By: Helyn Numbers MD   On: 03/07/2020 20:06   CT Head Wo Contrast  Result Date: 03/07/2020 CLINICAL DATA:  Head trauma EXAM: CT HEAD WITHOUT CONTRAST TECHNIQUE: Contiguous axial images were obtained from the base of the skull through the vertex without intravenous contrast. COMPARISON:  CT brain 08/12/2019 FINDINGS: Brain: No  acute territorial infarction, hemorrhage, or intracranial mass. Mild atrophy. Mild chronic small vessel ischemic changes of the white matter. Chronic appearing lacunar infarcts in the basal ganglia. Stable punctate calcifications in the left periventricular white matter. Stable ventricle size. Vascular: No hyperdense vessels.  Carotid vascular calcification Skull: Normal. Negative for fracture or focal lesion. Sinuses/Orbits: Mild mucosal thickening in the sinuses Other: None IMPRESSION: 1. No CT evidence for acute intracranial abnormality. 2. Atrophy and mild chronic small vessel ischemic changes of the white matter. Electronically Signed   By: Jasmine Pang M.D.   On: 03/07/2020 20:21   CT Cervical Spine Wo Contrast  Result Date: 03/07/2020 CLINICAL DATA:  Trauma EXAM: CT CERVICAL SPINE WITHOUT CONTRAST TECHNIQUE: Multidetector CT imaging of the cervical spine was performed without intravenous contrast. Multiplanar CT image reconstructions were also generated. COMPARISON:  08/12/2019 FINDINGS: Alignment: Stable alignment with trace retrolisthesis C5 on C6 and trace anterolisthesis C6 on C7. Facet alignment is maintained. Skull base and vertebrae: No acute fracture. No primary bone lesion or focal pathologic process. Soft tissues and spinal canal: No prevertebral fluid or swelling. No visible canal hematoma. Disc levels: Multiple level degenerative changes with moderate to marked disease at C4-C5 and C5-C6. Facet  degenerative change at multiple levels. Upper chest: Negative.  No appreciable thyroid tissue identified. Other: None IMPRESSION: Stable alignment of the cervical spine with degenerative changes. No acute osseous abnormality. Electronically Signed   By: Jasmine Pang M.D.   On: 03/07/2020 20:26    Lab Data:  CBC: Recent Labs  Lab 03/07/20 2059 03/08/20 0500 03/09/20 0254 03/10/20 0720 03/11/20 0005  WBC 10.0 6.0 7.5 7.7 8.1  NEUTROABS 7.3  --   --   --   --   HGB 13.2 12.2 13.0 13.8 13.1  HCT 41.4 36.8 37.6 38.8 37.0  MCV 99.8 98.9 94.7 92.4 92.3  PLT 140* 117* 110* 125* 121*   Basic Metabolic Panel: Recent Labs  Lab 03/07/20 2059 03/08/20 0500 03/09/20 0254 03/10/20 0053 03/11/20 0005  NA 140 141 139 136 133*  K 4.6 3.6 3.3* 5.4* 3.6  CL 107 107 105 101 102  CO2 25 27 23 23  21*  GLUCOSE 101* 87 106* 101* 103*  BUN 13 11 8 12 12   CREATININE 1.07* 0.93 0.77 0.81 0.77  CALCIUM 8.6* 8.5* 8.8* 8.6* 8.5*   GFR: Estimated Creatinine Clearance: 52.9 mL/min (by C-G formula based on SCr of 0.77 mg/dL). Liver Function Tests: Recent Labs  Lab 03/09/20 0254 03/10/20 0053 03/11/20 0005  AST 27 52* 22  ALT 27 28 22   ALKPHOS 66 64 57  BILITOT 1.0 1.8* 1.1  PROT 5.8* 6.0* 6.1*  ALBUMIN 3.2* 3.3* 3.2*   No results for input(s): LIPASE, AMYLASE in the last 168 hours. No results for input(s): AMMONIA in the last 168 hours. Coagulation Profile: No results for input(s): INR, PROTIME in the last 168 hours. Cardiac Enzymes: No results for input(s): CKTOTAL, CKMB, CKMBINDEX, TROPONINI in the last 168 hours. BNP (last 3 results) No results for input(s): PROBNP in the last 8760 hours. HbA1C: No results for input(s): HGBA1C in the last 72 hours. CBG: No results for input(s): GLUCAP in the last 168 hours. Lipid Profile: No results for input(s): CHOL, HDL, LDLCALC, TRIG, CHOLHDL, LDLDIRECT in the last 72 hours. Thyroid Function Tests: No results for input(s): TSH, T4TOTAL,  FREET4, T3FREE, THYROIDAB in the last 72 hours. Anemia Panel: Recent Labs    03/08/20 1529  FERRITIN 60   Urine analysis:  Component Value Date/Time   COLORURINE YELLOW 08/12/2019 1606   APPEARANCEUR CLOUDY (A) 08/12/2019 1606   LABSPEC 1.018 08/12/2019 1606   PHURINE 9.0 (H) 08/12/2019 1606   GLUCOSEU NEGATIVE 08/12/2019 1606   HGBUR NEGATIVE 08/12/2019 1606   BILIRUBINUR NEGATIVE 08/12/2019 1606   KETONESUR NEGATIVE 08/12/2019 1606   PROTEINUR NEGATIVE 08/12/2019 1606   UROBILINOGEN 0.2 12/09/2009 0006   NITRITE NEGATIVE 08/12/2019 1606   LEUKOCYTESUR NEGATIVE 08/12/2019 1606     Jordan Horne M.D. [facility] 03/11/2020, 2:10 PM   Call night coverage person covering after 7pm

## 2020-03-12 ENCOUNTER — Encounter (HOSPITAL_COMMUNITY): Payer: Medicare Other

## 2020-03-12 DIAGNOSIS — R7989 Other specified abnormal findings of blood chemistry: Secondary | ICD-10-CM

## 2020-03-12 LAB — COMPREHENSIVE METABOLIC PANEL
ALT: 20 U/L (ref 0–44)
AST: 24 U/L (ref 15–41)
Albumin: 3.3 g/dL — ABNORMAL LOW (ref 3.5–5.0)
Alkaline Phosphatase: 59 U/L (ref 38–126)
Anion gap: 10 (ref 5–15)
BUN: 11 mg/dL (ref 8–23)
CO2: 21 mmol/L — ABNORMAL LOW (ref 22–32)
Calcium: 8.7 mg/dL — ABNORMAL LOW (ref 8.9–10.3)
Chloride: 100 mmol/L (ref 98–111)
Creatinine, Ser: 0.76 mg/dL (ref 0.44–1.00)
GFR, Estimated: >60 mL/min (ref >60)
Glucose, Bld: 95 mg/dL (ref 70–99)
Potassium: 3.4 mmol/L — ABNORMAL LOW (ref 3.5–5.1)
Sodium: 131 mmol/L — ABNORMAL LOW (ref 135–145)
Total Bilirubin: 1.3 mg/dL — ABNORMAL HIGH (ref 0.3–1.2)
Total Protein: 6.3 g/dL — ABNORMAL LOW (ref 6.5–8.1)

## 2020-03-12 LAB — CBC
HCT: 40.5 % (ref 36.0–46.0)
Hemoglobin: 13.9 g/dL (ref 12.0–15.0)
MCH: 31.9 pg (ref 26.0–34.0)
MCHC: 34.3 g/dL (ref 30.0–36.0)
MCV: 92.9 fL (ref 80.0–100.0)
Platelets: 154 10*3/uL (ref 150–400)
RBC: 4.36 MIL/uL (ref 3.87–5.11)
RDW: 13.5 % (ref 11.5–15.5)
WBC: 8.2 10*3/uL (ref 4.0–10.5)
nRBC: 0 % (ref 0.0–0.2)

## 2020-03-12 LAB — D-DIMER, QUANTITATIVE: D-Dimer, Quant: 3.68 ug/mL-FEU — ABNORMAL HIGH (ref 0.00–0.50)

## 2020-03-12 MED ORDER — ENOXAPARIN SODIUM 40 MG/0.4ML ~~LOC~~ SOLN
40.0000 mg | Freq: Every day | SUBCUTANEOUS | Status: DC
  Administered 2020-03-12 – 2020-03-15 (×4): 40 mg via SUBCUTANEOUS
  Filled 2020-03-12 (×4): qty 0.4

## 2020-03-12 MED ORDER — POTASSIUM CHLORIDE CRYS ER 20 MEQ PO TBCR
40.0000 meq | EXTENDED_RELEASE_TABLET | Freq: Once | ORAL | Status: AC
  Administered 2020-03-12: 09:00:00 40 meq via ORAL
  Filled 2020-03-12: qty 2

## 2020-03-12 NOTE — Progress Notes (Signed)
[facility]                                                                              Patient Demographics  Jordan Horne, is a 86 y.o. female, DOB - Oct 01, 1932, IDP:[id number]  Admit date - 03/07/2020   Admitting Physician Ripudeep Jenna Luo, MD  Outpatient Primary MD for the patient is Dema Severin, NP  Outpatient specialists:   LOS - 4  days   Medical records reviewed and are as summarized below:    chief complain: Fall with moderate to severe pain in the back  Brief summary   Jordan Horne is a 86 y.o. female with medical history significant for mild dementia, HLD, hypothyroidism who presents after a fall at home. She was walking at home today when she fell down 3 steps at about 4:30 PM. Patient states she just tripped and fell. Patient states she hit the back of her head and landed on her right side. She is having moderate to severe pain in the back of her head and neck. Patient states she is having pain also on the right side of her ribs. She did not take anything for the pain at home.She denies any shortness of breath. No abdominal pain. She did not have LOC. No seizure activity reported.  Per husband, patient has completed both Covid vaccinations x2 and had received booster last week as well.   Subjective:   Jordan Horne was seen and examined today.  She is herself denies any complaints, no significant events overnight as discussed with staff.    Assessment & Plan      Multiple fractures of ribs, right side, initial encounter for closed fracture -Secondary to mechanical fall at home, fractures of right eighth, ninth ribs with intractable pain -Lumbar spine x-ray negative for any fractures -PT evaluation recommended SNF versus home health PT with 24/7 support.  Family requested SNF, social work consult placed -Pain currently controlled with scheduled Tylenol, Lidoderm patch, Toradol and low-dose IV Dilaudid.  COVID-19  positive -Incidental COVID-19 positive on admission. Has pleuritic chest pain from the fractures of right eighth ninth ribs.  Had vaccination x2, booster last week.   -Continue pain control incentive spirometry, O2 sats 98% on room air -Chest x-ray clear on 12/14 -Per pharmacy, does not meet criteria for Mab infusion due to partially or fully vaccinated for COVID-19, asymptomatic with a cycle time> 32 -Currently stable, O2 sats 99% on room air, asymptomatic -With elevated D-dimer at 3.68 this morning, I will start on DVT prophylaxis 40 mg subcu daily, and will start with venous Dopplers, will continue to trend.   Hyperlipidemia Continue statin  Hypothyroidism Continue Synthroid  Acute metabolic encephalopathy - Likely due to narcotics, also likely has underlying dementia with sundowning,  - CT head on admission showed no acute intracranial abnormality, atrophy and mild chronic small vessel ischemic changes of the white matter  Hypokalemia -Repleted   Code Status: Full CODE STATUS DVT Prophylaxis:  Lovenox  Family Communication: Discussed all imaging results, lab results, explained to the patient and patient's husband on the phone on 12/16, awaiting skilled nursing facility  Disposition Plan:  Status is: Inpatient  The patient will require care spanning > 2 midnights and Dispo: The patient is from: Home              Anticipated d/c is to: SNF              Anticipated d/c date is: 2 days              Patient currently is not medically stable to d/c.   Awaiting SNF, likely Monday     Time Spent in minutes 25 minutes  Procedures:  None Consultants:   None  Antimicrobials:   Anti-infectives (From admission, onward)   None         Medications  Scheduled Meds: . acetaminophen  650 mg Oral TID  . aspirin EC  81 mg Oral Daily  . enoxaparin (LOVENOX) injection  40 mg Subcutaneous Daily  . ketorolac  15 mg Intravenous Q6H  . levothyroxine  75 mcg Oral Daily  .  lidocaine  1 patch Transdermal Q24H  . rosuvastatin  10 mg Oral Daily   Continuous Infusions:  PRN Meds:.acetaminophen **OR** acetaminophen, HYDROmorphone (DILAUDID) injection       Objective:   Vitals:   03/11/20 0511 03/11/20 1304 03/11/20 2033 03/12/20 0657  BP: (!) 171/95 (!) 168/71 (!) 167/77 (!) 157/87  Pulse: 87 92 90 91  Resp: 17 16 15 16   Temp: 98.3 F (36.8 C) 98.5 F (36.9 C) 98.3 F (36.8 C) 98.1 F (36.7 C)  TempSrc: Oral Oral Axillary Axillary  SpO2: 98% 99% 100% 100%  Weight:      Height:        Intake/Output Summary (Last 24 hours) at 03/12/2020 1059 Last data filed at 03/11/2020 1916 Gross per 24 hour  Intake 0 ml  Output 1000 ml  Net -1000 ml     Wt Readings from Last 3 Encounters:  03/10/20 84.4 kg  12/02/19 72.2 kg  09/09/19 73.5 kg   Physical Exam  Awake, pleasantly confused, alert x1, easily distracted a Symmetrical Chest wall movement, Good air movement bilaterally, CTAB RRR,No Gallops,Rubs or new Murmurs, No Parasternal Heave +ve B.Sounds, Abd Soft, No tenderness, No rebound - guarding or rigidity. No Cyanosis, Clubbing or edema, No new Rash or bruise      Data Reviewed:  I have personally reviewed following labs and imaging studies  Micro Results Recent Results (from the past 240 hour(s))  Resp Panel by RT-PCR (Flu A&B, Covid) Nasopharyngeal Swab     Status: Abnormal   Collection Time: 03/07/20 11:08 PM   Specimen: Nasopharyngeal Swab; Nasopharyngeal(NP) swabs in vial transport medium  Result Value Ref Range Status   SARS Coronavirus 2 by RT PCR POSITIVE (A) NEGATIVE Final    Comment: RESULT CALLED TO, READ BACK BY AND VERIFIED WITH: 03/09/20, A RN 03/08/20 at 1224 sk (NOTE) SARS-CoV-2 target nucleic acids are DETECTED.  The SARS-CoV-2 RNA is generally detectable in upper respiratory specimens during the acute phase of infection. Positive results are indicative of the presence of the identified virus, but do not rule out  bacterial infection or co-infection with other pathogens not detected by the test. Clinical correlation with patient history and other diagnostic information is necessary to determine patient infection status. The expected result is Negative.  Fact Sheet for Patients: 03/10/20  Fact Sheet for Healthcare Providers: BloggerCourse.com  This test is not yet approved or cleared by the [place] FDA and  has been authorized for detection and/or diagnosis of SARS-CoV-2 by FDA  under an Emergency Use Authorization (EUA).  This EUA will remain in effect (meaning this test can be  used) for the duration of  the COVID-19 declaration under Section 564(b)(1) of the Act, 21 U.S.C. section 360bbb-3(b)(1), unless the authorization is terminated or revoked sooner.     Influenza A by PCR NEGATIVE NEGATIVE Final   Influenza B by PCR NEGATIVE NEGATIVE Final    Comment: (NOTE) The Xpert Xpress SARS-CoV-2/FLU/RSV plus assay is intended as an aid in the diagnosis of influenza from Nasopharyngeal swab specimens and should not be used as a sole basis for treatment. Nasal washings and aspirates are unacceptable for Xpert Xpress SARS-CoV-2/FLU/RSV testing.  Fact Sheet for Patients: BloggerCourse.com  Fact Sheet for Healthcare Providers: [employer]ious[employer]it  This test is not yet approved or cleared by the [place] FDA and has been authorized for detection and/or diagnosis of SARS-CoV-2 by FDA under an Emergency Use Authorization (EUA). This EUA will remain in effect (meaning this test can be used) for the duration of the COVID-19 declaration under Section 564(b)(1) of the Act, 21 U.S.C. section 360bbb-3(b)(1), unless the authorization is terminated or revoked.  Performed at [facility], [address], [place], [place] [address]     Radiology Reports DG Ribs Unilateral W/Chest  Right  Result Date: 03/07/2020 CLINICAL DATA:  Fall, right chest pain EXAM: RIGHT RIBS AND CHEST - 3+ VIEW COMPARISON:  None. FINDINGS: Single view radiograph of the chest and two view radiograph of the right ribs demonstrates acute minimally displaced fractures of the right eighth and ninth ribs posteriorly. Minimal left basilar atelectasis or infiltrate. Lungs are otherwise clear. No pneumothorax or pleural effusion. Cardiac size is at the upper limits of normal. Pulmonary vascularity is normal. IMPRESSION: Acute minimally displaced right 8, 9 rib fractures. No pneumothorax. Electronically Signed   By: Helyn Numbers MD   On: 03/07/2020 20:05   DG Thoracic Spine 2 View  Result Date: 03/07/2020 CLINICAL DATA:  Fall, back pain EXAM: THORACIC SPINE 2 VIEWS COMPARISON:  04/24/2018 FINDINGS: Two view radiograph thoracic spine demonstrates normal thoracic kyphosis. Mild thoracic dextroscoliosis, apex right at T5, unchanged. No acute fracture or listhesis of the thoracic spine. Vertebral body height has been preserved. There is diffuse intervertebral disc space narrowing and endplate remodeling throughout the thoracic spine in keeping with changes of diffuse severe degenerative disc disease. The paraspinal soft tissues are unremarkable. Incidental note is made of a small hiatal hernia. IMPRESSION: No acute fracture or listhesis. Electronically Signed   By: Helyn Numbers MD   On: 03/07/2020 20:02   DG Lumbar Spine Complete  Result Date: 03/07/2020 CLINICAL DATA:  Fall, back pain EXAM: LUMBAR SPINE - COMPLETE 4+ VIEW COMPARISON:  None. FINDINGS: Five view radiograph lumbar spine. Normal lumbar lordosis. No acute fracture or listhesis of the lumbar spine. Vertebral body height has been preserved. There is diffuse severe intervertebral disc space narrowing and endplate remodeling with multilevel vacuum disc phenomena in keeping with changes of diffuse severe degenerative disc disease throughout the lumbar  spine. Oblique views demonstrate no evidence of pars defect. Paraspinal soft tissues are unremarkable. IMPRESSION: No acute fracture or listhesis. Electronically Signed   By: Helyn Numbers MD   On: 03/07/2020 20:06   CT Head Wo Contrast  Result Date: 03/07/2020 CLINICAL DATA:  Head trauma EXAM: CT HEAD WITHOUT CONTRAST TECHNIQUE: Contiguous axial images were obtained from the base of the skull through the vertex without intravenous contrast. COMPARISON:  CT brain 08/12/2019 FINDINGS: Brain: No  acute territorial infarction, hemorrhage, or intracranial mass. Mild atrophy. Mild chronic small vessel ischemic changes of the white matter. Chronic appearing lacunar infarcts in the basal ganglia. Stable punctate calcifications in the left periventricular white matter. Stable ventricle size. Vascular: No hyperdense vessels.  Carotid vascular calcification Skull: Normal. Negative for fracture or focal lesion. Sinuses/Orbits: Mild mucosal thickening in the sinuses Other: None IMPRESSION: 1. No CT evidence for acute intracranial abnormality. 2. Atrophy and mild chronic small vessel ischemic changes of the white matter. Electronically Signed   By: Jasmine PangKim  Fujinaga M.D.   On: 03/07/2020 20:21   CT Cervical Spine Wo Contrast  Result Date: 03/07/2020 CLINICAL DATA:  Trauma EXAM: CT CERVICAL SPINE WITHOUT CONTRAST TECHNIQUE: Multidetector CT imaging of the cervical spine was performed without intravenous contrast. Multiplanar CT image reconstructions were also generated. COMPARISON:  08/12/2019 FINDINGS: Alignment: Stable alignment with trace retrolisthesis C5 on C6 and trace anterolisthesis C6 on C7. Facet alignment is maintained. Skull base and vertebrae: No acute fracture. No primary bone lesion or focal pathologic process. Soft tissues and spinal canal: No prevertebral fluid or swelling. No visible canal hematoma. Disc levels: Multiple level degenerative changes with moderate to marked disease at C4-C5 and C5-C6. Facet  degenerative change at multiple levels. Upper chest: Negative.  No appreciable thyroid tissue identified. Other: None IMPRESSION: Stable alignment of the cervical spine with degenerative changes. No acute osseous abnormality. Electronically Signed   By: Jasmine PangKim  Fujinaga M.D.   On: 03/07/2020 20:26    Lab Data:  CBC: Recent Labs  Lab 03/07/20 2059 03/08/20 0500 03/09/20 0254 03/10/20 0720 03/11/20 0005 03/12/20 0421  WBC 10.0 6.0 7.5 7.7 8.1 8.2  NEUTROABS 7.3  --   --   --   --   --   HGB 13.2 12.2 13.0 13.8 13.1 13.9  HCT 41.4 36.8 37.6 38.8 37.0 40.5  MCV 99.8 98.9 94.7 92.4 92.3 92.9  PLT 140* 117* 110* 125* 121* 154   Basic Metabolic Panel: Recent Labs  Lab 03/08/20 0500 03/09/20 0254 03/10/20 0053 03/11/20 0005 03/12/20 0421  NA 141 139 136 133* 131*  K 3.6 3.3* 5.4* 3.6 3.4*  CL 107 105 101 102 100  CO2 27 23 23  21* 21*  GLUCOSE 87 106* 101* 103* 95  BUN 11 8 12 12 11   CREATININE 0.93 0.77 0.81 0.77 0.76  CALCIUM 8.5* 8.8* 8.6* 8.5* 8.7*   GFR: Estimated Creatinine Clearance: 52.9 mL/min (by C-G formula based on SCr of 0.76 mg/dL). Liver Function Tests: Recent Labs  Lab 03/09/20 0254 03/10/20 0053 03/11/20 0005 03/12/20 0421  AST 27 52* 22 24  ALT 27 28 22 20   ALKPHOS 66 64 57 59  BILITOT 1.0 1.8* 1.1 1.3*  PROT 5.8* 6.0* 6.1* 6.3*  ALBUMIN 3.2* 3.3* 3.2* 3.3*   No results for input(s): LIPASE, AMYLASE in the last 168 hours. No results for input(s): AMMONIA in the last 168 hours. Coagulation Profile: No results for input(s): INR, PROTIME in the last 168 hours. Cardiac Enzymes: No results for input(s): CKTOTAL, CKMB, CKMBINDEX, TROPONINI in the last 168 hours. BNP (last 3 results) No results for input(s): PROBNP in the last 8760 hours. HbA1C: No results for input(s): HGBA1C in the last 72 hours. CBG: No results for input(s): GLUCAP in the last 168 hours. Lipid Profile: No results for input(s): CHOL, HDL, LDLCALC, TRIG, CHOLHDL, LDLDIRECT in the  last 72 hours. Thyroid Function Tests: No results for input(s): TSH, T4TOTAL, FREET4, T3FREE, THYROIDAB in the last 72 hours.  Anemia Panel: No results for input(s): VITAMINB12, FOLATE, FERRITIN, TIBC, IRON, RETICCTPCT in the last 72 hours. Urine analysis:    Component Value Date/Time   COLORURINE YELLOW 08/12/2019 1606   APPEARANCEUR CLOUDY (A) 08/12/2019 1606   LABSPEC 1.018 08/12/2019 1606   PHURINE 9.0 (H) 08/12/2019 1606   GLUCOSEU NEGATIVE 08/12/2019 1606   HGBUR NEGATIVE 08/12/2019 1606   BILIRUBINUR NEGATIVE 08/12/2019 1606   KETONESUR NEGATIVE 08/12/2019 1606   PROTEINUR NEGATIVE 08/12/2019 1606   UROBILINOGEN 0.2 12/09/2009 0006   NITRITE NEGATIVE 08/12/2019 1606   LEUKOCYTESUR NEGATIVE 08/12/2019 1606     Dannell Gortney M.D. [facility] 03/12/2020, 10:59 AM   Call night coverage person covering after 7pm

## 2020-03-13 ENCOUNTER — Inpatient Hospital Stay (HOSPITAL_COMMUNITY): Payer: Medicare Other

## 2020-03-13 DIAGNOSIS — U071 COVID-19: Secondary | ICD-10-CM

## 2020-03-13 DIAGNOSIS — R7989 Other specified abnormal findings of blood chemistry: Secondary | ICD-10-CM

## 2020-03-13 LAB — D-DIMER, QUANTITATIVE: D-Dimer, Quant: 4.36 ug/mL-FEU — ABNORMAL HIGH (ref 0.00–0.50)

## 2020-03-13 LAB — BASIC METABOLIC PANEL
Anion gap: 10 (ref 5–15)
BUN: 20 mg/dL (ref 8–23)
CO2: 24 mmol/L (ref 22–32)
Calcium: 9.1 mg/dL (ref 8.9–10.3)
Chloride: 99 mmol/L (ref 98–111)
Creatinine, Ser: 0.91 mg/dL (ref 0.44–1.00)
GFR, Estimated: >60 mL/min (ref >60)
Glucose, Bld: 88 mg/dL (ref 70–99)
Potassium: 3.9 mmol/L (ref 3.5–5.1)
Sodium: 133 mmol/L — ABNORMAL LOW (ref 135–145)

## 2020-03-13 LAB — CBC
HCT: 42.8 % (ref 36.0–46.0)
Hemoglobin: 14.1 g/dL (ref 12.0–15.0)
MCH: 31.5 pg (ref 26.0–34.0)
MCHC: 32.9 g/dL (ref 30.0–36.0)
MCV: 95.7 fL (ref 80.0–100.0)
Platelets: 170 10*3/uL (ref 150–400)
RBC: 4.47 MIL/uL (ref 3.87–5.11)
RDW: 13.7 % (ref 11.5–15.5)
WBC: 10.7 10*3/uL — ABNORMAL HIGH (ref 4.0–10.5)
nRBC: 0 % (ref 0.0–0.2)

## 2020-03-13 MED ORDER — HYDROCODONE-ACETAMINOPHEN 5-325 MG PO TABS: 1.0000 | ORAL_TABLET | ORAL | Status: DC | PRN

## 2020-03-13 MED ORDER — BISACODYL 5 MG PO TBEC
10.0000 mg | DELAYED_RELEASE_TABLET | Freq: Once | ORAL | Status: AC
  Administered 2020-03-13: 11:00:00 10 mg via ORAL
  Filled 2020-03-13: qty 2

## 2020-03-13 MED ORDER — LORAZEPAM 2 MG/ML IJ SOLN
0.2500 mg | Freq: Once | INTRAMUSCULAR | Status: AC
  Administered 2020-03-13: 23:00:00 0.25 mg via INTRAVENOUS
  Filled 2020-03-13: qty 1

## 2020-03-13 MED ORDER — OXYCODONE HCL 5 MG PO TABS
2.5000 mg | ORAL_TABLET | Freq: Four times a day (QID) | ORAL | Status: DC | PRN
  Administered 2020-03-14 – 2020-03-27 (×14): 2.5 mg via ORAL
  Filled 2020-03-13 (×14): qty 1

## 2020-03-13 MED ORDER — SENNOSIDES-DOCUSATE SODIUM 8.6-50 MG PO TABS
2.0000 | ORAL_TABLET | Freq: Two times a day (BID) | ORAL | Status: DC
  Administered 2020-03-13 – 2020-03-30 (×30): 2 via ORAL
  Filled 2020-03-13 (×31): qty 2

## 2020-03-13 MED ORDER — POLYETHYLENE GLYCOL 3350 17 G PO PACK
17.0000 g | PACK | Freq: Once | ORAL | Status: AC
  Administered 2020-03-13: 11:00:00 17 g via ORAL
  Filled 2020-03-13: qty 1

## 2020-03-13 MED ORDER — OXYCODONE HCL 5 MG PO TABS: 5.0000 mg | ORAL_TABLET | Freq: Four times a day (QID) | ORAL | Status: DC | PRN

## 2020-03-13 NOTE — Progress Notes (Signed)
[facility]                                                                              Patient Demographics  Jordan Horne, is a 86 y.o. female, DOB - [birth year], HOZ:[id number]  Admit date - 03/07/2020   Admitting Physician Ripudeep Jenna Luo, MD  Outpatient Primary MD for the patient is Dema Severin, NP  Outpatient specialists:   LOS - 5  days   Medical records reviewed and are as summarized below:    chief complain: Fall with moderate to severe pain in the back  Brief summary   Jordan Horne is a 86 y.o. female with medical history significant for mild dementia, HLD, hypothyroidism who presents after a fall at home. She was walking at home today when she fell down 3 steps at about 4:30 PM. Patient states she just tripped and fell. Patient states she hit the back of her head and landed on her right side. She is having moderate to severe pain in the back of her head and neck. Patient states she is having pain also on the right side of her ribs. She did not take anything for the pain at home.She denies any shortness of breath. No abdominal pain. She did not have LOC. No seizure activity reported.  Per husband, patient has completed both Covid vaccinations x2 and had received booster last week as well.   Subjective:   Jordan Horne was seen and examined today.  She remains confused, she had urinary retention yesterday which she required 1 time and then out.  Assessment & Plan      Multiple fractures of ribs, right side, initial encounter for closed fracture -Secondary to mechanical fall at home, fractures of right eighth, ninth ribs with intractable pain -Lumbar spine x-ray negative for any fractures -PT evaluation recommended SNF versus home health PT with 24/7 support.  Family requested SNF, social work consult placed -She is on scheduled Tylenol, Lidoderm patch, I will change IV Dilaudid to p.o. oxycodone .  COVID-19 positive -Incidental  COVID-19 positive on admission. Has pleuritic chest pain from the fractures of right eighth ninth ribs.  Had vaccination x2, booster last week.   -Continue pain control incentive spirometry, O2 sats 98% on room air -Chest x-ray clear on 12/14 -Per pharmacy, does not meet criteria for Mab infusion due to partially or fully vaccinated for COVID-19, asymptomatic with a cycle time> 32 -Currently stable, O2 sats 99% on room air, asymptomatic -D-dimers trending up, will obtain venous Dopplers, continue with DVT  prophylaxis dose .  Hyperlipidemia Continue statin  Hypothyroidism Continue Synthroid  Urinary retention -Most likely due to his mobility and locations, she had 1100 cc output after in and out, will continue with the bladder scan, minimize pain medicine and try to ambulate.  Acute metabolic encephalopathy - Likely due to narcotics, also likely has underlying dementia with sundowning,  - CT head on admission showed no acute intracranial abnormality, atrophy and mild chronic small vessel ischemic changes of the white matter  Hypokalemia -Repleted   Code Status: Full CODE STATUS DVT Prophylaxis:  Lovenox  Family Communication: Discussed with husband  by phone12/02/2020   Disposition Plan:     Status is: Inpatient  The patient will require care spanning > 2 midnights and Dispo: The patient is from: Home              Anticipated d/c is to: SNF              Anticipated d/c date is: 2 days              Patient currently is not medically stable to d/c.   Awaiting SNF, likely Monday     Time Spent in minutes 25 minutes  Procedures:  None Consultants:   None  Antimicrobials:   Anti-infectives (From admission, onward)   None         Medications  Scheduled Meds: . acetaminophen  650 mg Oral TID  . aspirin EC  81 mg Oral Daily  . enoxaparin (LOVENOX) injection  40 mg Subcutaneous Daily  . levothyroxine  75 mcg Oral Daily  . lidocaine  1 patch Transdermal Q24H  .  rosuvastatin  10 mg Oral Daily   Continuous Infusions:  PRN Meds:.acetaminophen **OR** acetaminophen, oxyCODONE       Objective:   Vitals:   03/11/20 2033 03/12/20 0657 03/12/20 1556 03/12/20 2111  BP: (!) 167/77 (!) 157/87 140/86 119/85  Pulse: 90 91 83 88  Resp: 15 16 15 18   Temp: 98.3 F (36.8 C) 98.1 F (36.7 C) 98.9 F (37.2 C) 98.7 F (37.1 C)  TempSrc: Axillary Axillary Oral Axillary  SpO2: 100% 100% 98% 99%  Weight:      Height:        Intake/Output Summary (Last 24 hours) at 03/13/2020 0946 Last data filed at 03/13/2020 0845 Gross per 24 hour  Intake 150 ml  Output 1225 ml  Net -1075 ml     Wt Readings from Last 3 Encounters:  03/10/20 84.4 kg  12/02/19 72.2 kg  09/09/19 73.5 kg   Physical Exam  Awake, confused, easily distracted, alert x1  Good air entry bilaterally, clear to auscultation  Regular rate and rhythm, no rubs or gallops . Abdomen  soft, nontender, bowel sounds present . No Cyanosis, Clubbing or edema, No new Rash or bruise      Data Reviewed:  I have personally reviewed following labs and imaging studies  Micro Results Recent Results (from the past 240 hour(s))  Resp Panel by RT-PCR (Flu A&B, Covid) Nasopharyngeal Swab     Status: Abnormal   Collection Time: 03/07/20 11:08 PM   Specimen: Nasopharyngeal Swab; Nasopharyngeal(NP) swabs in vial transport medium  Result Value Ref Range Status   SARS Coronavirus 2 by RT PCR POSITIVE (A) NEGATIVE Final    Comment: RESULT CALLED TO, READ BACK BY AND VERIFIED WITH: Laurice RecordDavison, A RN 03/08/20 at 1224 sk (NOTE) SARS-CoV-2 target nucleic acids are DETECTED.  The SARS-CoV-2 RNA is generally detectable in upper respiratory specimens during the acute phase of infection. Positive results are indicative of the presence of the identified virus, but do not rule out bacterial infection or co-infection with other pathogens not detected by the test. Clinical correlation with patient history  and other diagnostic information is necessary to determine patient infection status. The expected result is Negative.  Fact Sheet for Patients: BloggerCourse.comhttps://www.fda.gov/media/152166/download  Fact Sheet for Healthcare Providers: SeriousBroker.ithttps://www.fda.gov/media/152162/download  This test is not yet approved or cleared by the [place] FDA and  has been authorized for detection and/or diagnosis of SARS-CoV-2 by FDA under an Emergency Use Authorization (EUA).  This EUA will remain in effect (meaning this test can be  used) for the duration of  the COVID-19 declaration under Section 564(b)(1) of the Act, 21 U.S.C. section 360bbb-3(b)(1), unless the authorization is terminated or revoked sooner.     Influenza A by PCR NEGATIVE NEGATIVE Final   Influenza B by PCR NEGATIVE NEGATIVE Final    Comment: (NOTE) The Xpert Xpress SARS-CoV-2/FLU/RSV plus assay is intended as an aid in the diagnosis of influenza from Nasopharyngeal swab specimens and should not be used as a sole basis for treatment. Nasal washings and aspirates are unacceptable for Xpert Xpress SARS-CoV-2/FLU/RSV testing.  Fact Sheet for Patients: BloggerCourse.com  Fact Sheet for Healthcare Providers: SeriousBroker.it  This test is not yet approved or cleared by the [place] FDA and has been authorized for detection and/or diagnosis of SARS-CoV-2 by FDA under an Emergency Use Authorization (EUA). This EUA will remain in effect (meaning this test can be used) for the duration of the COVID-19 declaration under Section 564(b)(1) of the Act, 21 U.S.C. section 360bbb-3(b)(1), unless the authorization is terminated or revoked.  Performed at [facility], [address] [place], [place] [address]     Radiology Reports DG Ribs Unilateral W/Chest Right  Result Date: 03/07/2020 CLINICAL DATA:  Fall, right chest pain EXAM: RIGHT RIBS AND CHEST - 3+ VIEW COMPARISON:  None.  FINDINGS: Single view radiograph of the chest and two view radiograph of the right ribs demonstrates acute minimally displaced fractures of the right eighth and ninth ribs posteriorly. Minimal left basilar atelectasis or infiltrate. Lungs are otherwise clear. No pneumothorax or pleural effusion. Cardiac size is at the upper limits of normal. Pulmonary vascularity is normal. IMPRESSION: Acute minimally displaced right 8, 9 rib fractures. No pneumothorax. Electronically Signed   By: Helyn Numbers MD   On: 03/07/2020 20:05   DG Thoracic Spine 2 View  Result Date: 03/07/2020 CLINICAL DATA:  Fall, back pain EXAM: THORACIC SPINE 2 VIEWS COMPARISON:  04/24/2018 FINDINGS: Two view radiograph thoracic spine demonstrates normal thoracic kyphosis. Mild thoracic dextroscoliosis, apex right at T5, unchanged. No acute fracture or listhesis of the thoracic spine. Vertebral body height has been preserved. There is diffuse intervertebral disc space narrowing and endplate remodeling throughout the thoracic spine in keeping with changes of diffuse severe degenerative disc disease. The paraspinal soft tissues are unremarkable. Incidental note is made of a small hiatal hernia. IMPRESSION: No acute fracture or listhesis. Electronically Signed   By: Helyn Numbers MD   On: 03/07/2020 20:02   DG Lumbar Spine Complete  Result Date: 03/07/2020 CLINICAL DATA:  Fall, back pain EXAM: LUMBAR SPINE - COMPLETE 4+ VIEW COMPARISON:  None. FINDINGS: Five view radiograph lumbar spine. Normal lumbar lordosis. No acute fracture or listhesis of the lumbar spine. Vertebral body height has been preserved. There is diffuse severe intervertebral disc space narrowing and endplate remodeling with multilevel vacuum disc phenomena in keeping with changes of diffuse severe degenerative disc disease throughout the lumbar spine. Oblique views demonstrate no evidence of pars defect. Paraspinal soft tissues are unremarkable. IMPRESSION: No acute fracture  or listhesis. Electronically Signed   By: Helyn Numbers MD   On: 03/07/2020 20:06   CT Head Wo Contrast  Result Date: 03/07/2020 CLINICAL DATA:  Head trauma EXAM: CT HEAD WITHOUT CONTRAST TECHNIQUE: Contiguous axial images were obtained from the base of the skull through the vertex without intravenous contrast. COMPARISON:  CT brain 08/12/2019 FINDINGS: Brain: No acute territorial infarction, hemorrhage, or intracranial mass.  Mild atrophy. Mild chronic small vessel ischemic changes of the white matter. Chronic appearing lacunar infarcts in the basal ganglia. Stable punctate calcifications in the left periventricular white matter. Stable ventricle size. Vascular: No hyperdense vessels.  Carotid vascular calcification Skull: Normal. Negative for fracture or focal lesion. Sinuses/Orbits: Mild mucosal thickening in the sinuses Other: None IMPRESSION: 1. No CT evidence for acute intracranial abnormality. 2. Atrophy and mild chronic small vessel ischemic changes of the white matter. Electronically Signed   By: Jasmine Pang M.D.   On: 03/07/2020 20:21   CT Cervical Spine Wo Contrast  Result Date: 03/07/2020 CLINICAL DATA:  Trauma EXAM: CT CERVICAL SPINE WITHOUT CONTRAST TECHNIQUE: Multidetector CT imaging of the cervical spine was performed without intravenous contrast. Multiplanar CT image reconstructions were also generated. COMPARISON:  08/12/2019 FINDINGS: Alignment: Stable alignment with trace retrolisthesis C5 on C6 and trace anterolisthesis C6 on C7. Facet alignment is maintained. Skull base and vertebrae: No acute fracture. No primary bone lesion or focal pathologic process. Soft tissues and spinal canal: No prevertebral fluid or swelling. No visible canal hematoma. Disc levels: Multiple level degenerative changes with moderate to marked disease at C4-C5 and C5-C6. Facet degenerative change at multiple levels. Upper chest: Negative.  No appreciable thyroid tissue identified. Other: None IMPRESSION:  Stable alignment of the cervical spine with degenerative changes. No acute osseous abnormality. Electronically Signed   By: Jasmine Pang M.D.   On: 03/07/2020 20:26    Lab Data:  CBC: Recent Labs  Lab 03/07/20 2059 03/08/20 0500 03/09/20 0254 03/10/20 0720 03/11/20 0005 03/12/20 0421 03/13/20 0419  WBC 10.0   < > 7.5 7.7 8.1 8.2 10.7*  NEUTROABS 7.3  --   --   --   --   --   --   HGB 13.2   < > 13.0 13.8 13.1 13.9 14.1  HCT 41.4   < > 37.6 38.8 37.0 40.5 42.8  MCV 99.8   < > 94.7 92.4 92.3 92.9 95.7  PLT 140*   < > 110* 125* 121* 154 170   < > = values in this interval not displayed.   Basic Metabolic Panel: Recent Labs  Lab 03/09/20 0254 03/10/20 0053 03/11/20 0005 03/12/20 0421 03/13/20 0419  NA 139 136 133* 131* 133*  K 3.3* 5.4* 3.6 3.4* 3.9  CL 105 101 102 100 99  CO2 23 23 21* 21* 24  GLUCOSE 106* 101* 103* 95 88  BUN 8 12 12 11 20   CREATININE 0.77 0.81 0.77 0.76 0.91  CALCIUM 8.8* 8.6* 8.5* 8.7* 9.1   GFR: Estimated Creatinine Clearance: 46.5 mL/min (by C-G formula based on SCr of 0.91 mg/dL). Liver Function Tests: Recent Labs  Lab 03/09/20 0254 03/10/20 0053 03/11/20 0005 03/12/20 0421  AST 27 52* 22 24  ALT 27 28 22 20   ALKPHOS 66 64 57 59  BILITOT 1.0 1.8* 1.1 1.3*  PROT 5.8* 6.0* 6.1* 6.3*  ALBUMIN 3.2* 3.3* 3.2* 3.3*   No results for input(s): LIPASE, AMYLASE in the last 168 hours. No results for input(s): AMMONIA in the last 168 hours. Coagulation Profile: No results for input(s): INR, PROTIME in the last 168 hours. Cardiac Enzymes: No results for input(s): CKTOTAL, CKMB, CKMBINDEX, TROPONINI in the last 168 hours. BNP (last 3 results) No results for input(s): PROBNP in the last 8760 hours. HbA1C: No results for input(s): HGBA1C in the last 72 hours. CBG: No results for input(s): GLUCAP in the last 168 hours. Lipid Profile: No results for input(s):  CHOL, HDL, LDLCALC, TRIG, CHOLHDL, LDLDIRECT in the last 72 hours. Thyroid Function  Tests: No results for input(s): TSH, T4TOTAL, FREET4, T3FREE, THYROIDAB in the last 72 hours. Anemia Panel: No results for input(s): VITAMINB12, FOLATE, FERRITIN, TIBC, IRON, RETICCTPCT in the last 72 hours. Urine analysis:    Component Value Date/Time   COLORURINE YELLOW 08/12/2019 1606   APPEARANCEUR CLOUDY (A) 08/12/2019 1606   LABSPEC 1.018 08/12/2019 1606   PHURINE 9.0 (H) 08/12/2019 1606   GLUCOSEU NEGATIVE 08/12/2019 1606   HGBUR NEGATIVE 08/12/2019 1606   BILIRUBINUR NEGATIVE 08/12/2019 1606   KETONESUR NEGATIVE 08/12/2019 1606   PROTEINUR NEGATIVE 08/12/2019 1606   UROBILINOGEN 0.2 12/09/2009 0006   NITRITE NEGATIVE 08/12/2019 1606   LEUKOCYTESUR NEGATIVE 08/12/2019 1606     Miski Feldpausch M.D. [facility] 03/13/2020, 9:46 AM   Call night coverage person covering after 7pm

## 2020-03-13 NOTE — Progress Notes (Signed)
Physical Therapy Treatment Patient Details Name: Jordan Horne MRN: [id number] DOB: 14-Oct-1932 Today's Date: 03/13/2020    History of Present Illness Pt is an 86 y.o. female admitted 03/07/20 after fall down steps hitting the back of her head with no LOC. Pt sustained R 8-9th rib fxs. Incidental (+) COVID-19; pt fully vaccinated. CXR clear. PMH includes mild dementia.    PT Comments    Pt sitting up moaning and holding her stomach on entry. Decreased response with questions, repeatedly stating "Oh, God", but pt does state she has to go to the bathroom. Provided pt with BSC and she was agreeable. Pt requires modA for coming to standing and transferring to [facility]. Pt with significant BM requiring maximal A for standing pericare. Pt becomes fatigued and requires maxA for pivoting back to recliner. D/c plans remain appropriate at this time. PT will continue to follow acutely.    Follow Up Recommendations  SNF;Supervision/Assistance - 24 hour     Equipment Recommendations  3in1 (PT);Wheelchair (measurements PT);Wheelchair cushion (measurements PT)       Precautions / Restrictions Precautions Precautions: Fall Restrictions Weight Bearing Restrictions: No    Mobility  Bed Mobility Overal bed mobility: Needs Assistance Bed Mobility: Rolling Rolling: Min assist         General bed mobility comments: Seated in recliner on entry, after use of BSC pt requires additional pericare, recliner maximally reclined and min A for rolling to clean prior to sitting up in chair  Transfers Overall transfer level: Needs assistance Equipment used: Rolling walker (2 wheeled) Transfers: Sit to/from [employer] Sit to Stand: Mod assist Stand pivot transfers: Max assist;Mod assist       General transfer comment: modA for coming to stand and pivoting to [facility], modA for standing after use of BSC, however fatigues during standing pericare requiring max A for pivot back to  recliner  Ambulation/Gait             General Gait Details: unable to to attempt to to fatigue and stomach pain after use of BSC       Balance Overall balance assessment: Needs assistance Sitting-balance support: Feet supported Sitting balance-Leahy Scale: Fair     Standing balance support: Bilateral upper extremity supported;During functional activity Standing balance-Leahy Scale: Poor Standing balance comment: Reliant on UE support                            Cognition Arousal/Alertness: Awake/alert Behavior During Therapy: WFL for tasks assessed/performed;Anxious Overall Cognitive Status: History of cognitive impairments - at baseline Area of Impairment: Orientation;Attention;Memory;Following commands;Safety/judgement;Awareness;Problem solving                 Orientation Level: Disoriented to;Place;Time;Situation Current Attention Level: Focused Memory: Decreased recall of precautions;Decreased short-term memory Following Commands: Follows one step commands inconsistently;Follows one step commands with increased time Safety/Judgement: Decreased awareness of safety;Decreased awareness of deficits Awareness: Intellectual Problem Solving: Slow processing;Decreased initiation;Difficulty sequencing;Requires verbal cues;Requires tactile cues General Comments: Pt with minimal response to questions just keeps statign "Oh God"         General Comments General comments (skin integrity, edema, etc.): VSS on RA      Pertinent Vitals/Pain Pain Assessment: Faces Faces Pain Scale: Hurts little more Pain Location: pt holding stomach Pain Descriptors / Indicators: Grimacing;Guarding;Moaning;Tiring Pain Intervention(s): Monitored during session;Repositioned;Limited activity within patient's tolerance    Home Living Family/patient expects to be discharged to:: Private residence Living Arrangements: Spouse/significant other Available Help  at Discharge:  Family Type of Home: House         Additional Comments: Pt poor historian; reports she lives with husband who assists as needed    Prior Function Level of Independence: Needs assistance  Gait / Transfers Assistance Needed: Admitted for fall. Pt reports she uses cane or walker sometimes, prefers cane ADL's / Homemaking Assistance Needed: Likely requires at least supervision for majority of ADL tasks     PT Goals (current goals can now be found in the care plan section) Acute Rehab PT Goals PT Goal Formulation: With patient Time For Goal Achievement: 03/23/20 Potential to Achieve Goals: Fair Progress towards PT goals: Not progressing toward goals - comment (limited by fatigue)    Frequency    Min 2X/week      PT Plan Current plan remains appropriate       AM-PAC PT "6 Clicks" Mobility   Outcome Measure  Help needed turning from your back to your side while in a flat bed without using bedrails?: A Little Help needed moving from lying on your back to sitting on the side of a flat bed without using bedrails?: A Little Help needed moving to and from a bed to a chair (including a wheelchair)?: A Lot Help needed standing up from a chair using your arms (e.g., wheelchair or bedside chair)?: A Lot Help needed to walk in hospital room?: A Lot Help needed climbing 3-5 steps with a railing? : A Lot 6 Click Score: 14    End of Session Equipment Utilized During Treatment: Gait belt Activity Tolerance: Patient tolerated treatment well;Patient limited by fatigue Patient left: in chair;with call bell/phone within reach;with chair alarm set;with nursing/sitter in room Nurse Communication: Mobility status;Other (comment) (pt BM) PT Visit Diagnosis: Other abnormalities of gait and mobility (R26.89);Pain;Muscle weakness (generalized) (M62.81)     Time: 2725-3664 PT Time Calculation (min) (ACUTE ONLY): 34 min  Charges:  $Therapeutic Activity: 23-37 mins                     Jordan Ellen  B. Beverely Horne PT, DPT Acute Rehabilitation Services Pager [phone] Office [phone]    Jordan Horne 03/13/2020, 6:15 PM

## 2020-03-13 NOTE — Progress Notes (Signed)
Spoke with DR Georgetta Haber on the phone referred pt latest condition, pt was confused, restless, agitated on bed, kept on moving and talking, when being asked if in pain pain denies of pain and if asked what she need, pt said nothing, Dr Georgetta Haber ordered to give ativan IV- please see Mar

## 2020-03-13 NOTE — Progress Notes (Signed)
No urine output this shift, bladder scan shows in bladder.  amber colored urine drained from bladder.  Physician notified, bladder scan patient in 4 hours.

## 2020-03-13 NOTE — Progress Notes (Signed)
Bilateral lower extremity venous study completed.      Please see CV Proc for preliminary results.   Jasiyah Paulding, RVT  

## 2020-03-13 NOTE — Care Management Important Message (Signed)
Important Message  Patient Details  Name: LOUSIE CALICO MRN: [id number] Date of Birth: Feb 09, 1933   Medicare Important Message Given:  Yes - Important Message mailed due to current National Emergency  Verbal consent obtained due to current National Emergency  Relationship to patient: Self Contact Name: Jimena Wieczorek Call Date: 03/13/20  Time: 1429 Phone: [phone] Outcome: No Answer/Busy Important Message mailed to: Patient address on file    Orson Aloe 03/13/2020, 2:29 PM

## 2020-03-14 DIAGNOSIS — G9341 Metabolic encephalopathy: Secondary | ICD-10-CM

## 2020-03-14 LAB — D-DIMER, QUANTITATIVE: D-Dimer, Quant: 2.32 ug/mL-FEU — ABNORMAL HIGH (ref 0.00–0.50)

## 2020-03-14 LAB — CBC
HCT: 34.3 % — ABNORMAL LOW (ref 36.0–46.0)
Hemoglobin: 11.4 g/dL — ABNORMAL LOW (ref 12.0–15.0)
MCH: 31.3 pg (ref 26.0–34.0)
MCHC: 33.2 g/dL (ref 30.0–36.0)
MCV: 94.2 fL (ref 80.0–100.0)
Platelets: 123 10*3/uL — ABNORMAL LOW (ref 150–400)
RBC: 3.64 MIL/uL — ABNORMAL LOW (ref 3.87–5.11)
RDW: 13.6 % (ref 11.5–15.5)
WBC: 11.7 10*3/uL — ABNORMAL HIGH (ref 4.0–10.5)
nRBC: 0 % (ref 0.0–0.2)

## 2020-03-14 MED ORDER — QUETIAPINE FUMARATE 50 MG PO TABS
25.0000 mg | ORAL_TABLET | Freq: Every day | ORAL | Status: DC
  Administered 2020-03-14 – 2020-03-17 (×4): 25 mg via ORAL
  Filled 2020-03-14 (×4): qty 1

## 2020-03-14 MED ORDER — CHLORHEXIDINE GLUCONATE CLOTH 2 % EX PADS
6.0000 | MEDICATED_PAD | Freq: Every day | CUTANEOUS | Status: DC
  Administered 2020-03-14 – 2020-03-30 (×13): 6 via TOPICAL

## 2020-03-14 MED ORDER — ENSURE ENLIVE PO LIQD
237.0000 mL | Freq: Two times a day (BID) | ORAL | Status: DC
  Administered 2020-03-14: 18:00:00 237 mL via ORAL
  Filled 2020-03-14: qty 237

## 2020-03-14 NOTE — Progress Notes (Signed)
[facility]                                                                              Patient Demographics  Jordan Horne, is a 86 y.o. female, DOB - 12-22-1932, BWI:[id number]  Admit date - 03/07/2020   Admitting Physician Ripudeep Jenna Luo, MD  Outpatient Primary MD for the patient is Dema Severin, NP  Outpatient specialists:   LOS - 6  days   Medical records reviewed and are as summarized below:    chief complain: Fall with moderate to severe pain in the back  Brief summary   Jordan Horne is a 86 y.o. female with medical history significant for mild dementia, HLD, hypothyroidism who presents after a fall at home. She was walking at home today when she fell down 3 steps at about 4:30 PM. Patient states she just tripped and fell. Patient states she hit the back of her head and landed on her right side. She is having moderate to severe pain in the back of her head and neck. Patient states she is having pain also on the right side of her ribs. She did not take anything for the pain at home.She denies any shortness of breath. No abdominal pain. She did not have LOC. No seizure activity reported.  Per husband, patient has completed both Covid vaccinations x2 and had received booster last week as well.   Subjective:   Jordan Horne was seen and examined today.  She remains confused, she is pleasant, no recurrence of urinary retention.    Assessment & Plan      Multiple fractures of ribs, right side, initial encounter for closed fracture -Secondary to mechanical fall at home, fractures of right eighth, ninth ribs with intractable pain -Lumbar spine x-ray negative for any fractures -PT evaluation recommended SNF versus home health PT with 24/7 support.  Family requested SNF, social work consult placed -She is on scheduled Tylenol, Lidoderm patch, I and as needed p.o. oxycodone .  COVID-19 positive -Incidental COVID-19 positive on admission. Has  pleuritic chest pain from the fractures of right eighth ninth ribs.  Had vaccination x2, booster last week.   -Continue pain control incentive spirometry, O2 sats 98% on room air -Chest x-ray clear on 12/14 -Per pharmacy, does not meet criteria for Mab infusion due to partially or fully vaccinated for COVID-19, asymptomatic with a cycle time> 32 -Currently stable, O2 sats 99% on room air, asymptomatic -D-dimers is elevated, but trending down, Doppler negative for DVT.  Marland Kitchen  Hyperlipidemia Continue statin  Hypothyroidism Continue Synthroid  Urinary retention -She is with couple episodes of urinary retention where she required in and out, this has improved continue with close monitoring with bladder scan.   Acute metabolic encephalopathy - Likely due to narcotics, also likely has underlying dementia with hospital delirium and sundowning, will start on low-dose Seroquel at bedtime. - CT head on admission showed no acute intracranial abnormality, atrophy and mild chronic small vessel ischemic changes of the white matter  Hypokalemia -Repleted   Code Status: Full CODE STATUS DVT Prophylaxis:  Lovenox  Family Communication: Discussed with husband by phone12/20/2021  Disposition Plan:     Status is: Inpatient  The patient will require care spanning > 2 midnights and Dispo: The patient is from: Home              Anticipated d/c is to: SNF              Anticipated d/c date is: 1 day              Patient currently is not medically stable to d/c.   Awaiting SNF,    Time Spent in minutes 25 minutes  Procedures:  None Consultants:   None  Antimicrobials:   Anti-infectives (From admission, onward)   None         Medications  Scheduled Meds: . acetaminophen  650 mg Oral TID  . aspirin EC  81 mg Oral Daily  . enoxaparin (LOVENOX) injection  40 mg Subcutaneous Daily  . levothyroxine  75 mcg Oral Daily  . lidocaine  1 patch Transdermal Q24H  . rosuvastatin  10 mg Oral  Daily  . senna-docusate  2 tablet Oral BID   Continuous Infusions:  PRN Meds:.acetaminophen **OR** acetaminophen, oxyCODONE       Objective:   Vitals:   03/13/20 2000 03/13/20 2037 03/14/20 0507 03/14/20 1206  BP:  (!) 123/105 134/72 132/84  Pulse:  94 84 90  Resp:  Temp:  97.8 F (36.6 C) 98.6 F (37 C) 98 F (36.7 C)  TempSrc:  Axillary Oral Axillary  SpO2: 99% 96% 100% 92%  Weight:      Height:        Intake/Output Summary (Last 24 hours) at 03/14/2020 1352 Last data filed at 03/14/2020 1317 Gross per 24 hour  Intake 500 ml  Output 500 ml  Net 0 ml     Wt Readings from Last 3 Encounters:  03/10/20 84.4 kg  12/02/19 72.2 kg  09/09/19 73.5 kg   Physical Exam  Awake , confused, pleasant  Symmetrical Chest wall movement, Good air movement bilaterally, CTAB RRR,No Gallops,Rubs or new Murmurs, No Parasternal Heave +ve B.Sounds, Abd Soft, No tenderness, No rebound - guarding or rigidity. No Cyanosis, Clubbing or edema, No new Rash or bruise       Data Reviewed:  I have personally reviewed following labs and imaging studies  Micro Results Recent Results (from the past 240 hour(s))  Resp Panel by RT-PCR (Flu A&B, Covid) Nasopharyngeal Swab     Status: Abnormal   Collection Time: 03/07/20 11:08 PM   Specimen: Nasopharyngeal Swab; Nasopharyngeal(NP) swabs in vial transport medium  Result Value Ref Range Status   SARS Coronavirus 2 by RT PCR POSITIVE (A) NEGATIVE Final    Comment: RESULT CALLED TO, READ BACK BY AND VERIFIED WITH: Laurice Record, A RN 03/08/20 at 1224 sk (NOTE) SARS-CoV-2 target nucleic acids are DETECTED.  The SARS-CoV-2 RNA is generally detectable in upper respiratory specimens during the acute phase of infection. Positive results are indicative of the presence of the identified virus, but do not rule out bacterial infection or co-infection with other pathogens not detected by the test. Clinical correlation with patient history  and other diagnostic information is necessary to determine patient infection status. The expected result is Negative.  Fact Sheet for Patients: BloggerCourse.com  Fact Sheet for Healthcare Providers: SeriousBroker.it  This test is not yet approved or cleared by the [place] FDA and  has been authorized for detection and/or diagnosis of SARS-CoV-2 by FDA under an Emergency Use Authorization (EUA).  This EUA will remain in effect (meaning this test can be  used) for the duration of  the COVID-19 declaration under Section 564(b)(1) of the Act, 21 U.S.C. section 360bbb-3(b)(1), unless the authorization is terminated or revoked sooner.     Influenza A by PCR NEGATIVE NEGATIVE Final   Influenza B by PCR NEGATIVE NEGATIVE Final    Comment: (NOTE) The Xpert Xpress SARS-CoV-2/FLU/RSV plus assay is intended as an aid in the diagnosis of influenza from Nasopharyngeal swab specimens and should not be used as a sole basis for treatment. Nasal washings and aspirates are unacceptable for Xpert Xpress SARS-CoV-2/FLU/RSV testing.  Fact Sheet for Patients: BloggerCourse.com  Fact Sheet for Healthcare Providers: SeriousBroker.it  This test is not yet approved or cleared by the [place] FDA and has been authorized for detection and/or diagnosis of SARS-CoV-2 by FDA under an Emergency Use Authorization (EUA). This EUA will remain in effect (meaning this test can be used) for the duration of the COVID-19 declaration under Section 564(b)(1) of the Act, 21 U.S.C. section 360bbb-3(b)(1), unless the authorization is terminated or revoked.  Performed at [facility], [address], [place], [place] [address]     Radiology Reports DG Ribs Unilateral W/Chest Right  Result Date: 03/07/2020 CLINICAL DATA:  Fall, right chest pain EXAM: RIGHT RIBS AND CHEST - 3+ VIEW COMPARISON:  None.  FINDINGS: Single view radiograph of the chest and two view radiograph of the right ribs demonstrates acute minimally displaced fractures of the right eighth and ninth ribs posteriorly. Minimal left basilar atelectasis or infiltrate. Lungs are otherwise clear. No pneumothorax or pleural effusion. Cardiac size is at the upper limits of normal. Pulmonary vascularity is normal. IMPRESSION: Acute minimally displaced right 8, 9 rib fractures. No pneumothorax. Electronically Signed   By: Helyn Numbers MD   On: 03/07/2020 20:05   DG Thoracic Spine 2 View  Result Date: 03/07/2020 CLINICAL DATA:  Fall, back pain EXAM: THORACIC SPINE 2 VIEWS COMPARISON:  04/24/2018 FINDINGS: Two view radiograph thoracic spine demonstrates normal thoracic kyphosis. Mild thoracic dextroscoliosis, apex right at T5, unchanged. No acute fracture or listhesis of the thoracic spine. Vertebral body height has been preserved. There is diffuse intervertebral disc space narrowing and endplate remodeling throughout the thoracic spine in keeping with changes of diffuse severe degenerative disc disease. The paraspinal soft tissues are unremarkable. Incidental note is made of a small hiatal hernia. IMPRESSION: No acute fracture or listhesis. Electronically Signed   By: Helyn Numbers MD   On: 03/07/2020 20:02   DG Lumbar Spine Complete  Result Date: 03/07/2020 CLINICAL DATA:  Fall, back pain EXAM: LUMBAR SPINE - COMPLETE 4+ VIEW COMPARISON:  None. FINDINGS: Five view radiograph lumbar spine. Normal lumbar lordosis. No acute fracture or listhesis of the lumbar spine. Vertebral body height has been preserved. There is diffuse severe intervertebral disc space narrowing and endplate remodeling with multilevel vacuum disc phenomena in keeping with changes of diffuse severe degenerative disc disease throughout the lumbar spine. Oblique views demonstrate no evidence of pars defect. Paraspinal soft tissues are unremarkable. IMPRESSION: No acute fracture  or listhesis. Electronically Signed   By: Helyn Numbers MD   On: 03/07/2020 20:06   CT Head Wo Contrast  Result Date: 03/07/2020 CLINICAL DATA:  Head trauma EXAM: CT HEAD WITHOUT CONTRAST TECHNIQUE: Contiguous axial images were obtained from the base of the skull through the vertex without intravenous contrast. COMPARISON:  CT brain 08/12/2019 FINDINGS: Brain: No acute territorial infarction, hemorrhage, or intracranial mass.  Mild atrophy. Mild chronic small vessel ischemic changes of the white matter. Chronic appearing lacunar infarcts in the basal ganglia. Stable punctate calcifications in the left periventricular white matter. Stable ventricle size. Vascular: No hyperdense vessels.  Carotid vascular calcification Skull: Normal. Negative for fracture or focal lesion. Sinuses/Orbits: Mild mucosal thickening in the sinuses Other: None IMPRESSION: 1. No CT evidence for acute intracranial abnormality. 2. Atrophy and mild chronic small vessel ischemic changes of the white matter. Electronically Signed   By: Jasmine PangKim  Fujinaga M.D.   On: 03/07/2020 20:21   CT Cervical Spine Wo Contrast  Result Date: 03/07/2020 CLINICAL DATA:  Trauma EXAM: CT CERVICAL SPINE WITHOUT CONTRAST TECHNIQUE: Multidetector CT imaging of the cervical spine was performed without intravenous contrast. Multiplanar CT image reconstructions were also generated. COMPARISON:  08/12/2019 FINDINGS: Alignment: Stable alignment with trace retrolisthesis C5 on C6 and trace anterolisthesis C6 on C7. Facet alignment is maintained. Skull base and vertebrae: No acute fracture. No primary bone lesion or focal pathologic process. Soft tissues and spinal canal: No prevertebral fluid or swelling. No visible canal hematoma. Disc levels: Multiple level degenerative changes with moderate to marked disease at C4-C5 and C5-C6. Facet degenerative change at multiple levels. Upper chest: Negative.  No appreciable thyroid tissue identified. Other: None IMPRESSION:  Stable alignment of the cervical spine with degenerative changes. No acute osseous abnormality. Electronically Signed   By: Jasmine PangKim  Fujinaga M.D.   On: 03/07/2020 20:26   VAS US LOWER EXTREMITY VENOUS (DVT)  Result Date: 03/13/2020  Lower Venous DVT Study Other Indications: Covid, Elevated D-Dimer. Comparison Study: No previous exam Performing Technologist: Clint GuyLisa Gibson RVT  Examination Guidelines: A complete evaluation includes B-mode imaging, spectral Doppler, color Doppler, and power Doppler as needed of all accessible portions of each vessel. Bilateral testing is considered an integral part of a complete examination. Limited examinations for reoccurring indications may be performed as noted. The reflux portion of the exam is performed with the patient in reverse Trendelenburg.  +---------+---------------+---------+-----------+----------+--------------+ RIGHT    CompressibilityPhasicitySpontaneityPropertiesThrombus Aging +---------+---------------+---------+-----------+----------+--------------+ CFV      Full           Yes      Yes                                 +---------+---------------+---------+-----------+----------+--------------+ SFJ      Full                                                        +---------+---------------+---------+-----------+----------+--------------+ FV Prox  Full                                                        +---------+---------------+---------+-----------+----------+--------------+ FV Mid   Full                                                        +---------+---------------+---------+-----------+----------+--------------+ FV DistalFull                                                        +---------+---------------+---------+-----------+----------+--------------+  PFV      Full                                                        +---------+---------------+---------+-----------+----------+--------------+ POP      Full            Yes      Yes                                 +---------+---------------+---------+-----------+----------+--------------+ PTV      Full                                                        +---------+---------------+---------+-----------+----------+--------------+ PERO     Full                                                        +---------+---------------+---------+-----------+----------+--------------+   +---------+---------------+---------+-----------+----------+--------------+ LEFT     CompressibilityPhasicitySpontaneityPropertiesThrombus Aging +---------+---------------+---------+-----------+----------+--------------+ CFV      Full           Yes      Yes                                 +---------+---------------+---------+-----------+----------+--------------+ SFJ      Full                                                        +---------+---------------+---------+-----------+----------+--------------+ FV Prox  Full                                                        +---------+---------------+---------+-----------+----------+--------------+ FV Mid   Full                                                        +---------+---------------+---------+-----------+----------+--------------+ FV DistalFull                                                        +---------+---------------+---------+-----------+----------+--------------+ PFV      Full                                                        +---------+---------------+---------+-----------+----------+--------------+  POP      Full           Yes      Yes                                 +---------+---------------+---------+-----------+----------+--------------+ PTV      Full                                                        +---------+---------------+---------+-----------+----------+--------------+ PERO     Full                                                         +---------+---------------+---------+-----------+----------+--------------+     Summary: RIGHT: - There is no evidence of deep vein thrombosis in the lower extremity.  - No cystic structure found in the popliteal fossa.  LEFT: - There is no evidence of deep vein thrombosis in the lower extremity.  - No cystic structure found in the popliteal fossa.  *See table(s) above for measurements and observations. Electronically signed by Waverly Ferrari MD on 03/13/2020 at 4:35:46 PM.    Final     Lab Data:  CBC: Recent Labs  Lab 03/07/20 2059 03/08/20 0500 03/10/20 0720 03/11/20 0005 03/12/20 0421 03/13/20 0419 03/14/20 0801  WBC 10.0   < > 7.7 8.1 8.2 10.7* 11.7*  NEUTROABS 7.3  --   --   --   --   --   --   HGB 13.2   < > 13.8 13.1 13.9 14.1 11.4*  HCT 41.4   < > 38.8 37.0 40.5 42.8 34.3*  MCV 99.8   < > 92.4 92.3 92.9 95.7 94.2  PLT 140*   < > 125* 121* 154 170 123*   < > = values in this interval not displayed.   Basic Metabolic Panel: Recent Labs  Lab 03/09/20 0254 03/10/20 0053 03/11/20 0005 03/12/20 0421 03/13/20 0419  NA 139 136 133* 131* 133*  K 3.3* 5.4* 3.6 3.4* 3.9  CL 105 101 102 100 99  CO2 23 23 21* 21* 24  GLUCOSE 106* 101* 103* 95 88  BUN 8 12 12 11 20   CREATININE 0.77 0.81 0.77 0.76 0.91  CALCIUM 8.8* 8.6* 8.5* 8.7* 9.1   GFR: Estimated Creatinine Clearance: 46.5 mL/min (by C-G formula based on SCr of 0.91 mg/dL). Liver Function Tests: Recent Labs  Lab 03/09/20 0254 03/10/20 0053 03/11/20 0005 03/12/20 0421  AST 27 52* 22 24  ALT 27 28 22 20   ALKPHOS 66 64 57 59  BILITOT 1.0 1.8* 1.1 1.3*  PROT 5.8* 6.0* 6.1* 6.3*  ALBUMIN 3.2* 3.3* 3.2* 3.3*   No results for input(s): LIPASE, AMYLASE in the last 168 hours. No results for input(s): AMMONIA in the last 168 hours. Coagulation Profile: No results for input(s): INR, PROTIME in the last 168 hours. Cardiac Enzymes: No results for input(s): CKTOTAL, CKMB, CKMBINDEX, TROPONINI in the last  168 hours. BNP (last 3 results) No results for input(s): PROBNP in the last 8760 hours. HbA1C: No results for input(s): HGBA1C in the last 72 hours. CBG: No  results for input(s): GLUCAP in the last 168 hours. Lipid Profile: No results for input(s): CHOL, HDL, LDLCALC, TRIG, CHOLHDL, LDLDIRECT in the last 72 hours. Thyroid Function Tests: No results for input(s): TSH, T4TOTAL, FREET4, T3FREE, THYROIDAB in the last 72 hours. Anemia Panel: No results for input(s): VITAMINB12, FOLATE, FERRITIN, TIBC, IRON, RETICCTPCT in the last 72 hours. Urine analysis:    Component Value Date/Time   COLORURINE YELLOW 08/12/2019 1606   APPEARANCEUR CLOUDY (A) 08/12/2019 1606   LABSPEC 1.018 08/12/2019 1606   PHURINE 9.0 (H) 08/12/2019 1606   GLUCOSEU NEGATIVE 08/12/2019 1606   HGBUR NEGATIVE 08/12/2019 1606   BILIRUBINUR NEGATIVE 08/12/2019 1606   KETONESUR NEGATIVE 08/12/2019 1606   PROTEINUR NEGATIVE 08/12/2019 1606   UROBILINOGEN 0.2 12/09/2009 0006   NITRITE NEGATIVE 08/12/2019 1606   LEUKOCYTESUR NEGATIVE 08/12/2019 1606     Delmon Andrada M.D. [facility] 03/14/2020, 1:52 PM   Call night coverage person covering after 7pm

## 2020-03-15 ENCOUNTER — Inpatient Hospital Stay (HOSPITAL_COMMUNITY): Payer: Medicare Other

## 2020-03-15 DIAGNOSIS — R112 Nausea with vomiting, unspecified: Secondary | ICD-10-CM

## 2020-03-15 LAB — BASIC METABOLIC PANEL
Anion gap: 11 (ref 5–15)
BUN: 19 mg/dL (ref 8–23)
CO2: 24 mmol/L (ref 22–32)
Calcium: 8.6 mg/dL — ABNORMAL LOW (ref 8.9–10.3)
Chloride: 95 mmol/L — ABNORMAL LOW (ref 98–111)
Creatinine, Ser: 0.71 mg/dL (ref 0.44–1.00)
GFR, Estimated: >60 mL/min (ref >60)
Glucose, Bld: 124 mg/dL — ABNORMAL HIGH (ref 70–99)
Potassium: 3.2 mmol/L — ABNORMAL LOW (ref 3.5–5.1)
Sodium: 130 mmol/L — ABNORMAL LOW (ref 135–145)

## 2020-03-15 LAB — URINALYSIS, ROUTINE W REFLEX MICROSCOPIC
Bilirubin Urine: NEGATIVE
Glucose, UA: NEGATIVE mg/dL
Ketones, ur: 5 mg/dL — AB
Nitrite: NEGATIVE
Protein, ur: 30 mg/dL — AB
Specific Gravity, Urine: 1.023 (ref 1.005–1.030)
pH: 5 (ref 5.0–8.0)

## 2020-03-15 LAB — HEPATIC FUNCTION PANEL
ALT: 28 U/L (ref 0–44)
AST: 36 U/L (ref 15–41)
Albumin: 3.4 g/dL — ABNORMAL LOW (ref 3.5–5.0)
Alkaline Phosphatase: 70 U/L (ref 38–126)
Bilirubin, Direct: 0.2 mg/dL (ref 0.0–0.2)
Indirect Bilirubin: 0.9 mg/dL (ref 0.3–0.9)
Total Bilirubin: 1.1 mg/dL (ref 0.3–1.2)
Total Protein: 6.3 g/dL — ABNORMAL LOW (ref 6.5–8.1)

## 2020-03-15 LAB — CBC
HCT: 32.3 % — ABNORMAL LOW (ref 36.0–46.0)
Hemoglobin: 11.1 g/dL — ABNORMAL LOW (ref 12.0–15.0)
MCH: 31.9 pg (ref 26.0–34.0)
MCHC: 34.4 g/dL (ref 30.0–36.0)
MCV: 92.8 fL (ref 80.0–100.0)
Platelets: 201 10*3/uL (ref 150–400)
RBC: 3.48 MIL/uL — ABNORMAL LOW (ref 3.87–5.11)
RDW: 13.7 % (ref 11.5–15.5)
WBC: 12.1 10*3/uL — ABNORMAL HIGH (ref 4.0–10.5)
nRBC: 0 % (ref 0.0–0.2)

## 2020-03-15 LAB — PROCALCITONIN: Procalcitonin: <0.1 ng/mL

## 2020-03-15 LAB — TROPONIN I (HIGH SENSITIVITY)
Troponin I (High Sensitivity): 12 ng/L (ref <18)
Troponin I (High Sensitivity): 12 ng/L (ref <18)

## 2020-03-15 LAB — LIPASE, BLOOD: Lipase: 21 U/L (ref 11–51)

## 2020-03-15 MED ORDER — ONDANSETRON HCL 4 MG/2ML IJ SOLN
INTRAMUSCULAR | Status: AC
  Administered 2020-03-15: 11:00:00 4 mg via INTRAVENOUS
  Filled 2020-03-15: qty 2

## 2020-03-15 MED ORDER — BISACODYL 10 MG RE SUPP
10.0000 mg | Freq: Once | RECTAL | Status: DC
  Filled 2020-03-15: qty 1

## 2020-03-15 MED ORDER — POTASSIUM CHLORIDE IN NACL 20-0.9 MEQ/L-% IV SOLN
INTRAVENOUS | Status: DC
  Filled 2020-03-15 (×3): qty 1000

## 2020-03-15 MED ORDER — LORAZEPAM 2 MG/ML IJ SOLN
0.5000 mg | Freq: Once | INTRAMUSCULAR | Status: AC
  Administered 2020-03-15: 16:00:00 0.5 mg via INTRAVENOUS
  Filled 2020-03-15: qty 1

## 2020-03-15 MED ORDER — PROSOURCE TF PO LIQD
45.0000 mL | Freq: Two times a day (BID) | ORAL | Status: DC
  Filled 2020-03-15: qty 45

## 2020-03-15 MED ORDER — ONDANSETRON HCL 4 MG/2ML IJ SOLN: 4.0000 mg | Freq: Once | INTRAMUSCULAR | Status: AC

## 2020-03-15 MED ORDER — ENSURE ENLIVE PO LIQD
237.0000 mL | Freq: Three times a day (TID) | ORAL | Status: DC
  Administered 2020-03-15 – 2020-03-30 (×41): 237 mL via ORAL
  Filled 2020-03-15 (×2): qty 237

## 2020-03-15 MED ORDER — PROSOURCE PLUS PO LIQD
30.0000 mL | Freq: Two times a day (BID) | ORAL | Status: DC
  Administered 2020-03-16 – 2020-03-30 (×26): 30 mL via ORAL
  Filled 2020-03-15 (×28): qty 30

## 2020-03-15 NOTE — Progress Notes (Signed)
Physical Therapy Treatment Patient Details Name: Jordan Horne MRN: [id number] DOB: December 11, 1932 Today's Date: 03/15/2020    History of Present Illness Pt is an 86 y.o. female admitted 03/07/20 after fall down steps hitting the back of her head with no LOC. Pt sustained R 8-9th rib fxs. Incidental (+) COVID-19; pt fully vaccinated. CXR clear. PMH includes mild dementia.   PT Comments    Pt with increased lethargy and worsening cognitive status this session. Pt following some simple commands to mobilize, but with minimal verbalizations and keep eyes closed. Pt demonstrates slowed processing, impaired attention and decreased awareness; required mod-maxA+2 for limited mobility. Called husband at end of session and pt not reacting to his voice on speaker phone. Continue to recommend SNF-level therapies to maximize functional mobility and independence.   Follow Up Recommendations  SNF;Supervision/Assistance - 24 hour     Equipment Recommendations  3in1 (PT);Wheelchair (measurements PT);Wheelchair cushion (measurements PT)    Recommendations for Other Services       Precautions / Restrictions Precautions Precautions: Fall Restrictions Weight Bearing Restrictions: No    Mobility  Bed Mobility Overal bed mobility: Needs Assistance Bed Mobility: Supine to Sit     Supine to sit: Max assist;+2 for physical assistance     General bed mobility comments: Max verbal/tactile cues and minA to initiate movement; ultimately requiring maxA+2 to elevate trunk and scoot hips to EOB  Transfers Overall transfer level: Needs assistance Equipment used: 2 person hand held assist Transfers: Sit to/from [employer] Sit to Stand: Mod assist;+2 physical assistance Stand pivot transfers: Mod assist;+2 physical assistance       General transfer comment: Initial modA+2 to elevate trunk with HHA from EOB, pt taking pivotal steps to recliner with modA+2 for stability, max verbal cues  for sequencing to sit  Ambulation/Gait                 Stairs             Wheelchair Mobility    Modified Rankin (Stroke Patients Only)       Balance Overall balance assessment: Needs assistance Sitting-balance support: Feet supported Sitting balance-Leahy Scale: Fair Sitting balance - Comments: Prolonged sitting at edge of bed and edge of recliner with forward flexed and guarded posture, able to extend trunk to achieve upright posture when cued, but not holding long     Standing balance-Leahy Scale: Poor Standing balance comment: Reliant on UE support and external assist                            Cognition Arousal/Alertness: Awake/alert;Lethargic   Overall Cognitive Status: History of cognitive impairments - at baseline Area of Impairment: Orientation;Attention;Memory;Following commands;Safety/judgement;Awareness;Problem solving                   Current Attention Level: Focused Memory: Decreased recall of precautions;Decreased short-term memory Following Commands: Follows one step commands inconsistently;Follows one step commands with increased time Safety/Judgement: Decreased awareness of safety;Decreased awareness of deficits Awareness: Intellectual Problem Solving: Slow processing;Decreased initiation;Difficulty sequencing;Requires verbal cues;Requires tactile cues General Comments: Following some commands. Becoming more awake with activity and answering some questions with minimal response, becoming less responsive and keeping eyes closed by end of session; not reacting to husband's name on phone and shaking head by end of session with no words spoken      Exercises      General Comments General comments (skin integrity, edema, etc.): Difficulty getting reliable  BP reading as pt moving BUEs and keeping tense despite attempts to have pt relax arm. Pt c/o dizziness with sitting and standing; Seated EOB BP 146/75, post-transfer BP  159/140      Pertinent Vitals/Pain Pain Assessment: Faces Faces Pain Scale: Hurts little more Pain Location: Would not specify Pain Descriptors / Indicators: Guarding;Grimacing;Tiring Pain Intervention(s): Monitored during session;Limited activity within patient's tolerance;Repositioned    Home Living                      Prior Function            PT Goals (current goals can now be found in the care plan section) Progress towards PT goals: Not progressing toward goals - comment (worsening lethargy and AMS)    Frequency    Min 2X/week      PT Plan Current plan remains appropriate    Co-evaluation PT/OT/SLP Co-Evaluation/Treatment: Yes (dovetail turned co-tx due to worsening cognitive/functional status) Reason for Co-Treatment: Necessary to address cognition/behavior during functional activity;For patient/therapist safety;To address functional/ADL transfers PT goals addressed during session: Mobility/safety with mobility;Balance        AM-PAC PT "6 Clicks" Mobility   Outcome Measure  Help needed turning from your back to your side while in a flat bed without using bedrails?: A Lot Help needed moving from lying on your back to sitting on the side of a flat bed without using bedrails?: A Lot Help needed moving to and from a bed to a chair (including a wheelchair)?: A Lot Help needed standing up from a chair using your arms (e.g., wheelchair or bedside chair)?: A Lot Help needed to walk in hospital room?: A Lot Help needed climbing 3-5 steps with a railing? : Total 6 Click Score: 11    End of Session Equipment Utilized During Treatment: Gait belt Activity Tolerance: Patient limited by fatigue;Patient limited by lethargy Patient left: in chair;with call bell/phone within reach;with chair alarm set Nurse Communication: Mobility status PT Visit Diagnosis: Other abnormalities of gait and mobility (R26.89);Pain;Muscle weakness (generalized) (M62.81)     Time:  9678-9381 PT Time Calculation (min) (ACUTE ONLY): 25 min  Charges:  $Therapeutic Activity: 8-22 mins                    [name], PT, DPT Acute Rehabilitation Services  Pager [phone] Office [phone]  [name] 03/15/2020, 1:27 PM

## 2020-03-15 NOTE — Progress Notes (Signed)
Physical Therapy Treatment Patient Details Name: Jordan Horne MRN: [id number] DOB: 1932-08-09 Today's Date: 03/15/2020    History of Present Illness Pt is an 86 y.o. female admitted 03/07/20 after fall down steps hitting the back of her head with no LOC. Pt sustained R 8-9th rib fxs. Incidental (+) COVID-19; pt fully vaccinated. CXR clear. PMH includes mild dementia.   PT Comments    Pt seen for additional session with worsening cognition/AMS, requiring maxA+2 for mobility, including stand pivot to bed and bed mobility. Pt strongly resisting movement in all extremities, still with good neck control with repositioning in bed. Keeping eyes closed at end of session and no verbalizations while PT present. RN/NT present; pt to have head CT and UTI screen. Will continue to follow acutely.    Follow Up Recommendations  SNF;Supervision/Assistance - 24 hour     Equipment Recommendations  3in1 (PT);Wheelchair (measurements PT);Wheelchair cushion (measurements PT)    Recommendations for Other Services       Precautions / Restrictions Precautions Precautions: Fall Restrictions Weight Bearing Restrictions: No    Mobility  Bed Mobility Overal bed mobility: Needs Assistance Bed Mobility: Sit to Supine     Supine to sit: Max assist;+2 for physical assistance Sit to supine: Max assist   General bed mobility comments: Not following commands to return to supine, requiring maxA to intiate movement and BLE management  Transfers Overall transfer level: Needs assistance Equipment used: 2 person hand held assist Transfers: Sit to/from Stand;Stand Pivot Transfers Sit to Stand: Max assist;+2 physical assistance;+2 safety/equipment Stand pivot transfers: Max assist;+2 physical assistance;+2 safety/equipment       General transfer comment: Pt holding trunk and knees extended with rigid posture, resisting movement; maxA+2 to initiate standing with pt allowing movement a bit more, maxA+2 to  pivot to bed, pt not taking steps despite cues requiring assist to manage BLEs  Ambulation/Gait                 Stairs             Wheelchair Mobility    Modified Rankin (Stroke Patients Only)       Balance Overall balance assessment: Needs assistance Sitting-balance support: No upper extremity supported;Feet unsupported Sitting balance-Leahy Scale: Poor Sitting balance - Comments: Prolonged sitting at edge of bed and edge of recliner with forward flexed and guarded posture, able to extend trunk to achieve upright posture when cued, but not holding long   Standing balance support: Bilateral upper extremity supported;During functional activity Standing balance-Leahy Scale: Zero Standing balance comment: MaxA to maintain standing                            Cognition Arousal/Alertness: Lethargic;Awake/alert Behavior During Therapy: Flat affect Overall Cognitive Status: Difficult to assess Area of Impairment: Orientation;Attention;Memory;Following commands;Safety/judgement;Awareness;Problem solving                 Orientation Level: Disoriented to;Situation Current Attention Level: Focused Memory: Decreased recall of precautions;Decreased short-term memory Following Commands: Follows one step commands inconsistently;Follows one step commands with increased time Safety/Judgement: Decreased awareness of safety;Decreased awareness of deficits Awareness: Intellectual Problem Solving: Slow processing;Decreased initiation;Difficulty sequencing;Requires verbal cues;Requires tactile cues General Comments: Pt awake with eyes opened but not answering any questions or attempting to verbalizing, once mobility initiated would keep eyes closed. Pt requiring max, multimodal cues for standing and taking steps; not consistently following commands. Very little interaction, actively resisting movement. Grimace to verbal stimuli, at  times opening eyes to name called       Exercises      General Comments General comments (skin integrity, edema, etc.): RN and NT present. Pt with worsening cognition, awaiting CT brain imaging and urine screen for potential UTI. HR and SpO2 stable on RA      Pertinent Vitals/Pain Pain Assessment: Faces Faces Pain Scale: Hurts a little bit Pain Location: unable to state Pain Descriptors / Indicators: Guarding;Grimacing Pain Intervention(s): Limited activity within patient's tolerance    Home Living                      Prior Function            PT Goals (current goals can now be found in the care plan section) Acute Rehab PT Goals Patient Stated Goal: "I can't do it" Progress towards PT goals: Not progressing toward goals - comment (worsening cognition/participation)    Frequency    Min 2X/week      PT Plan Current plan remains appropriate    Co-evaluation PT/OT/SLP Co-Evaluation/Treatment: Yes (dovetail turned co-tx due to worsening cognitive/functional status) Reason for Co-Treatment: Complexity of the patient's impairments (multi-system involvement);Necessary to address cognition/behavior during functional activity;For patient/therapist safety;To address functional/ADL transfers PT goals addressed during session: Mobility/safety with mobility;Balance        AM-PAC PT "6 Clicks" Mobility   Outcome Measure  Help needed turning from your back to your side while in a flat bed without using bedrails?: A Lot Help needed moving from lying on your back to sitting on the side of a flat bed without using bedrails?: A Lot Help needed moving to and from a bed to a chair (including a wheelchair)?: A Lot Help needed standing up from a chair using your arms (e.g., wheelchair or bedside chair)?: A Lot Help needed to walk in hospital room?: A Lot Help needed climbing 3-5 steps with a railing? : Total 6 Click Score: 11    End of Session Equipment Utilized During Treatment: Gait belt Activity  Tolerance: Patient limited by lethargy;Treatment limited secondary to medical complications (Comment) Patient left: in bed;with call bell/phone within reach;with nursing/sitter in room Nurse Communication: Mobility status PT Visit Diagnosis: Other abnormalities of gait and mobility (R26.89);Pain;Muscle weakness (generalized) (M62.81)     Time: 7510-2585 PT Time Calculation (min) (ACUTE ONLY): 12 min  Charges:  $Therapeutic Activity: 8-22 mins                     [name], PT, DPT Acute Rehabilitation Services  Pager [phone] Office [phone]  [name] 03/15/2020, 4:39 PM

## 2020-03-15 NOTE — TOC Progression Note (Signed)
Transition of Care [facility] - Progression Note    Patient Details  Name: Jordan Horne MRN: [id number] Date of Birth: 25-Aug-1932  [facility] CM/SW Contact  Mearl Latin, LCSW Phone Number: 03/15/2020, 1:25 PM  Clinical Narrative:    [facility] stated they could accept patient up until tomorrow morning and then will be closed until Monday.    Expected Discharge Plan: Skilled Nursing Facility Barriers to Discharge: SNF Pending bed offer,Continued Medical Work up  Expected Discharge Plan and Services Expected Discharge Plan: Skilled Nursing Facility   Discharge Planning Services: CM Consult Post Acute Care Choice: Skilled Nursing Facility Living arrangements for the past 2 months: Single Family Home                           HH Arranged: PT,OT,Nurse's Aide,Social Work [employer] Agency: [facility] Date [facility] Agency Contacted: 03/09/20 Time HH Agency Contacted: [phone] Representative spoke with at [facility] Agency: Wayne Both   Social Determinants of Health (SDOH) Interventions    Readmission Risk Interventions No flowsheet data found.

## 2020-03-15 NOTE — Plan of Care (Signed)
  Problem: Education: Goal: Knowledge of General Education information will improve Description: Including pain rating scale, medication(s)/side effects and non-pharmacologic comfort measures Outcome: Not Progressing   Problem: Activity: Goal: Risk for activity intolerance will decrease Outcome: Not Progressing   Problem: Nutrition: Goal: Adequate nutrition will be maintained Outcome: Not Progressing   

## 2020-03-15 NOTE — Progress Notes (Addendum)
[facility]                                                                              Patient Demographics  Jordan Horne, is a 86 y.o. female, DOB - Mar 01, 1933, ZOX:[id number]  Admit date - 03/07/2020   Admitting Physician Ripudeep Jenna Luo, MD  Outpatient Primary MD for the patient is Dema Severin, NP  Outpatient specialists:   LOS - 7  days   Medical records reviewed and are as summarized below:    chief complain: Fall with moderate to severe pain in the back  Brief summary   Jordan Horne is a 86 y.o. female with medical history significant for mild dementia, HLD, hypothyroidism who presents after a fall at home. She was walking at home today when she fell down 3 steps at about 4:30 PM. Patient states she just tripped and fell. Patient states she hit the back of her head and landed on her right side. She is having moderate to severe pain in the back of her head and neck. Patient states she is having pain also on the right side of her ribs. She did not take anything for the pain at home.She denies any shortness of breath. No abdominal pain. She did not have LOC. No seizure activity reported.  Per husband, patient has completed both Covid vaccinations x2 and had received booster last week as well.   Subjective:   Jordan Horne was seen and examined today.  She remains significantly confused, had urinary retention where had Foley catheter inserted.  She did have vomiting this morning. She did complain of jaw pain this morning, troponins is negative.  Assessment & Plan      Multiple fractures of ribs, right side, initial encounter for closed fracture -Secondary to mechanical fall at home, fractures of right eighth, ninth ribs with intractable pain -Lumbar spine x-ray negative for any fractures -PT evaluation recommended SNF versus home health PT with 24/7 support.  Family requested SNF, social work consult placed -She is on scheduled Tylenol,  Lidoderm patch, I and as needed p.o. oxycodone .  Acute metabolic encephalopathy - it does appear she did start to develop some dementia over last few months, will check b12, and TSH. -Patient is significantly encephalopathic over last 48 hours, no improvement with Seroquel. -Mild leukocytosis today, will check UA, she had some vomiting, KUB with no acute findings, will check procalcitonin as well. -We will obtain CT head. - will check procalcitonin as well. -Poor oral intake, with hyponatremia and hypokalemia, she will be started on IV fluids as well.  COVID-19 positive -Incidental COVID-19 positive on admission. Has pleuritic chest pain from the fractures of right eighth ninth ribs.  Had vaccination x2, booster last week.   -Continue pain control incentive spirometry, O2 sats 98% on room air -Chest x-ray clear on 12/14 -Per pharmacy, does not meet criteria for Mab infusion due to partially or fully vaccinated for COVID-19, asymptomatic with a cycle time> 32 -Currently stable, O2 sats 99% on room air, asymptomatic -D-dimers is elevated, but trending down, Doppler negative for DVT.   - will need isolation for 10 days .  Hyperlipidemia Continue statin  Hypothyroidism Continue Synthroid  Urinary retention -Required and out x3, Foley catheter inserted yesterday evening.  Hypokalemia -Repleted  Hyponatremia as of 12/22 -Due to volume depletion, started on IV fluids.   She did complain of jaw pain this morning, troponins is negative, EKG is pending She did have vomiting this morning, KUB with no acute findings. LFTs and  Lipase within normal limit.  Code Status: Full CODE STATUS DVT Prophylaxis:  Lovenox  Family Communication: Discussed with husband by phone12/20/2021   Disposition Plan:     Status is: Inpatient  The patient will require care spanning > 2 midnights and Dispo: The patient is from: Home              Anticipated d/c is to: SNF              Anticipated d/c date  is: 1 day              Patient currently is not medically stable to d/c.        Procedures:  None Consultants:   None  Antimicrobials:   Anti-infectives (From admission, onward)   None         Medications  Scheduled Meds: . (feeding supplement) PROSource Plus  30 mL Oral BID BM  . acetaminophen  650 mg Oral TID  . aspirin EC  81 mg Oral Daily  . bisacodyl  10 mg Rectal Once  . Chlorhexidine Gluconate Cloth  6 each Topical Daily  . enoxaparin (LOVENOX) injection  40 mg Subcutaneous Daily  . feeding supplement  237 mL Oral TID BM  . levothyroxine  75 mcg Oral Daily  . lidocaine  1 patch Transdermal Q24H  . LORazepam  0.5 mg Intravenous Once  . QUEtiapine  25 mg Oral QHS  . rosuvastatin  10 mg Oral Daily  . senna-docusate  2 tablet Oral BID   Continuous Infusions: . 0.9 % NaCl with KCl 20 mEq / L 75 mL/hr at 03/15/20 1246   PRN Meds:.acetaminophen **OR** acetaminophen, oxyCODONE       Objective:   Vitals:   03/15/20 0549 03/15/20 0630 03/15/20 1331 03/15/20 1430  BP:  (!) 142/104 (!) 65/44 130/66  Pulse: 84 87 73 88  Resp:  17 15 17   Temp: 99.9 F (37.7 C) 99.9 F (37.7 C) 98 F (36.7 C) 98 F (36.7 C)  TempSrc: Axillary Axillary Axillary Axillary  SpO2: 92% 92% 100% 92%  Weight:      Height:        Intake/Output Summary (Last 24 hours) at 03/15/2020 1552 Last data filed at 03/15/2020 0631 Gross per 24 hour  Intake 460 ml  Output 902 ml  Net -442 ml     Wt Readings from Last 3 Encounters:  03/10/20 84.4 kg  12/02/19 72.2 kg  09/09/19 73.5 kg   Physical Exam  Awake Alert, he is more confused this morning, incoherent, does not follow command, and appropriate . Symmetrical Chest wall movement, Good air movement bilaterally, CTAB RRR,No Gallops,Rubs or new Murmurs, No Parasternal Heave +ve B.Sounds, Abd Soft, No tenderness, No rebound - guarding or rigidity. No Cyanosis, Clubbing or edema, No new Rash or bruise        Data Reviewed:   I have personally reviewed following labs and imaging studies  Micro Results Recent Results (from the past 240 hour(s))  Resp Panel by RT-PCR (Flu A&B, Covid) Nasopharyngeal Swab     Status: Abnormal   Collection Time: 03/07/20 11:08  PM   Specimen: Nasopharyngeal Swab; Nasopharyngeal(NP) swabs in vial transport medium  Result Value Ref Range Status   SARS Coronavirus 2 by RT PCR POSITIVE (A) NEGATIVE Final    Comment: RESULT CALLED TO, READ BACK BY AND VERIFIED WITH: Laurice Record, A RN 03/08/20 at 1224 sk (NOTE) SARS-CoV-2 target nucleic acids are DETECTED.  The SARS-CoV-2 RNA is generally detectable in upper respiratory specimens during the acute phase of infection. Positive results are indicative of the presence of the identified virus, but do not rule out bacterial infection or co-infection with other pathogens not detected by the test. Clinical correlation with patient history and other diagnostic information is necessary to determine patient infection status. The expected result is Negative.  Fact Sheet for Patients: BloggerCourse.com  Fact Sheet for Healthcare Providers: SeriousBroker.it  This test is not yet approved or cleared by the [place] FDA and  has been authorized for detection and/or diagnosis of SARS-CoV-2 by FDA under an Emergency Use Authorization (EUA).  This EUA will remain in effect (meaning this test can be  used) for the duration of  the COVID-19 declaration under Section 564(b)(1) of the Act, 21 U.S.C. section 360bbb-3(b)(1), unless the authorization is terminated or revoked sooner.     Influenza A by PCR NEGATIVE NEGATIVE Final   Influenza B by PCR NEGATIVE NEGATIVE Final    Comment: (NOTE) The Xpert Xpress SARS-CoV-2/FLU/RSV plus assay is intended as an aid in the diagnosis of influenza from Nasopharyngeal swab specimens and should not be used as a sole basis for treatment. Nasal washings  and aspirates are unacceptable for Xpert Xpress SARS-CoV-2/FLU/RSV testing.  Fact Sheet for Patients: BloggerCourse.com  Fact Sheet for Healthcare Providers: SeriousBroker.it  This test is not yet approved or cleared by the [place] FDA and has been authorized for detection and/or diagnosis of SARS-CoV-2 by FDA under an Emergency Use Authorization (EUA). This EUA will remain in effect (meaning this test can be used) for the duration of the COVID-19 declaration under Section 564(b)(1) of the Act, 21 U.S.C. section 360bbb-3(b)(1), unless the authorization is terminated or revoked.  Performed at [facility], [address] [place], [place] [address]     Radiology Reports DG Ribs Unilateral W/Chest Right  Result Date: 03/07/2020 CLINICAL DATA:  Fall, right chest pain EXAM: RIGHT RIBS AND CHEST - 3+ VIEW COMPARISON:  None. FINDINGS: Single view radiograph of the chest and two view radiograph of the right ribs demonstrates acute minimally displaced fractures of the right eighth and ninth ribs posteriorly. Minimal left basilar atelectasis or infiltrate. Lungs are otherwise clear. No pneumothorax or pleural effusion. Cardiac size is at the upper limits of normal. Pulmonary vascularity is normal. IMPRESSION: Acute minimally displaced right 8, 9 rib fractures. No pneumothorax. Electronically Signed   By: Helyn Numbers MD   On: 03/07/2020 20:05   DG Thoracic Spine 2 View  Result Date: 03/07/2020 CLINICAL DATA:  Fall, back pain EXAM: THORACIC SPINE 2 VIEWS COMPARISON:  04/24/2018 FINDINGS: Two view radiograph thoracic spine demonstrates normal thoracic kyphosis. Mild thoracic dextroscoliosis, apex right at T5, unchanged. No acute fracture or listhesis of the thoracic spine. Vertebral body height has been preserved. There is diffuse intervertebral disc space narrowing and endplate remodeling throughout the thoracic spine in keeping  with changes of diffuse severe degenerative disc disease. The paraspinal soft tissues are unremarkable. Incidental note is made of a small hiatal hernia. IMPRESSION: No acute fracture or listhesis. Electronically Signed   By: Helyn Numbers MD  On: 03/07/2020 20:02   DG Lumbar Spine Complete  Result Date: 03/07/2020 CLINICAL DATA:  Fall, back pain EXAM: LUMBAR SPINE - COMPLETE 4+ VIEW COMPARISON:  None. FINDINGS: Five view radiograph lumbar spine. Normal lumbar lordosis. No acute fracture or listhesis of the lumbar spine. Vertebral body height has been preserved. There is diffuse severe intervertebral disc space narrowing and endplate remodeling with multilevel vacuum disc phenomena in keeping with changes of diffuse severe degenerative disc disease throughout the lumbar spine. Oblique views demonstrate no evidence of pars defect. Paraspinal soft tissues are unremarkable. IMPRESSION: No acute fracture or listhesis. Electronically Signed   By: Helyn NumbersAshesh  Parikh MD   On: 03/07/2020 20:06   DG Abd 1 View  Result Date: 03/15/2020 CLINICAL DATA:  Nausea and vomiting EXAM: ABDOMEN - 1 VIEW COMPARISON:  08/13/2018 FINDINGS: Bowel gas pattern is unremarkable. Cholecystectomy clips. No acute osseous abnormality. IMPRESSION: Normal bowel gas pattern. Electronically Signed   By: Guadlupe SpanishPraneil  Patel M.D.   On: 03/15/2020 10:41   CT Head Wo Contrast  Result Date: 03/07/2020 CLINICAL DATA:  Head trauma EXAM: CT HEAD WITHOUT CONTRAST TECHNIQUE: Contiguous axial images were obtained from the base of the skull through the vertex without intravenous contrast. COMPARISON:  CT brain 08/12/2019 FINDINGS: Brain: No acute territorial infarction, hemorrhage, or intracranial mass. Mild atrophy. Mild chronic small vessel ischemic changes of the white matter. Chronic appearing lacunar infarcts in the basal ganglia. Stable punctate calcifications in the left periventricular white matter. Stable ventricle size. Vascular: No hyperdense  vessels.  Carotid vascular calcification Skull: Normal. Negative for fracture or focal lesion. Sinuses/Orbits: Mild mucosal thickening in the sinuses Other: None IMPRESSION: 1. No CT evidence for acute intracranial abnormality. 2. Atrophy and mild chronic small vessel ischemic changes of the white matter. Electronically Signed   By: Jasmine PangKim  Fujinaga M.D.   On: 03/07/2020 20:21   CT Cervical Spine Wo Contrast  Result Date: 03/07/2020 CLINICAL DATA:  Trauma EXAM: CT CERVICAL SPINE WITHOUT CONTRAST TECHNIQUE: Multidetector CT imaging of the cervical spine was performed without intravenous contrast. Multiplanar CT image reconstructions were also generated. COMPARISON:  08/12/2019 FINDINGS: Alignment: Stable alignment with trace retrolisthesis C5 on C6 and trace anterolisthesis C6 on C7. Facet alignment is maintained. Skull base and vertebrae: No acute fracture. No primary bone lesion or focal pathologic process. Soft tissues and spinal canal: No prevertebral fluid or swelling. No visible canal hematoma. Disc levels: Multiple level degenerative changes with moderate to marked disease at C4-C5 and C5-C6. Facet degenerative change at multiple levels. Upper chest: Negative.  No appreciable thyroid tissue identified. Other: None IMPRESSION: Stable alignment of the cervical spine with degenerative changes. No acute osseous abnormality. Electronically Signed   By: Jasmine PangKim  Fujinaga M.D.   On: 03/07/2020 20:26   VAS US LOWER EXTREMITY VENOUS (DVT)  Result Date: 03/13/2020  Lower Venous DVT Study Other Indications: Covid, Elevated D-Dimer. Comparison Study: No previous exam Performing Technologist: Clint GuyLisa Gibson RVT  Examination Guidelines: A complete evaluation includes B-mode imaging, spectral Doppler, color Doppler, and power Doppler as needed of all accessible portions of each vessel. Bilateral testing is considered an integral part of a complete examination. Limited examinations for reoccurring indications may be performed  as noted. The reflux portion of the exam is performed with the patient in reverse Trendelenburg.  +---------+---------------+---------+-----------+----------+--------------+ RIGHT    CompressibilityPhasicitySpontaneityPropertiesThrombus Aging +---------+---------------+---------+-----------+----------+--------------+ CFV      Full           Yes      Yes                                 +---------+---------------+---------+-----------+----------+--------------+  SFJ      Full                                                        +---------+---------------+---------+-----------+----------+--------------+ FV Prox  Full                                                        +---------+---------------+---------+-----------+----------+--------------+ FV Mid   Full                                                        +---------+---------------+---------+-----------+----------+--------------+ FV DistalFull                                                        +---------+---------------+---------+-----------+----------+--------------+ PFV      Full                                                        +---------+---------------+---------+-----------+----------+--------------+ POP      Full           Yes      Yes                                 +---------+---------------+---------+-----------+----------+--------------+ PTV      Full                                                        +---------+---------------+---------+-----------+----------+--------------+ PERO     Full                                                        +---------+---------------+---------+-----------+----------+--------------+   +---------+---------------+---------+-----------+----------+--------------+ LEFT     CompressibilityPhasicitySpontaneityPropertiesThrombus Aging +---------+---------------+---------+-----------+----------+--------------+ CFV      Full            Yes      Yes                                 +---------+---------------+---------+-----------+----------+--------------+ SFJ      Full                                                        +---------+---------------+---------+-----------+----------+--------------+  FV Prox  Full                                                        +---------+---------------+---------+-----------+----------+--------------+ FV Mid   Full                                                        +---------+---------------+---------+-----------+----------+--------------+ FV DistalFull                                                        +---------+---------------+---------+-----------+----------+--------------+ PFV      Full                                                        +---------+---------------+---------+-----------+----------+--------------+ POP      Full           Yes      Yes                                 +---------+---------------+---------+-----------+----------+--------------+ PTV      Full                                                        +---------+---------------+---------+-----------+----------+--------------+ PERO     Full                                                        +---------+---------------+---------+-----------+----------+--------------+     Summary: RIGHT: - There is no evidence of deep vein thrombosis in the lower extremity.  - No cystic structure found in the popliteal fossa.  LEFT: - There is no evidence of deep vein thrombosis in the lower extremity.  - No cystic structure found in the popliteal fossa.  *See table(s) above for measurements and observations. Electronically signed by Waverly Ferrari MD on 03/13/2020 at 4:35:46 PM.    Final     Lab Data:  CBC: Recent Labs  Lab 03/11/20 0005 03/12/20 0421 03/13/20 0419 03/14/20 0801 03/15/20 1009  WBC 8.1 8.2 10.7* 11.7* 12.1*  HGB 13.1 13.9 14.1 11.4* 11.1*  HCT  37.0 40.5 42.8 34.3* 32.3*  MCV 92.3 92.9 95.7 94.2 92.8  PLT 121* 154 170 123* 201   Basic Metabolic Panel: Recent Labs  Lab 03/10/20 0053 03/11/20 0005 03/12/20 0421 03/13/20 0419 03/15/20 1009  NA 136 133* 131* 133* 130*  K 5.4* 3.6 3.4* 3.9 3.2*  CL 101 102 100 99 95*  CO2 23 21* 21* 24 24  GLUCOSE 101* 103* 95 88 124*  BUN CREATININE 0.81 0.77 0.76 0.91 0.71  CALCIUM 8.6* 8.5* 8.7* 9.1 8.6*   GFR: Estimated Creatinine Clearance: 52.9 mL/min (by C-G formula based on SCr of 0.71 mg/dL). Liver Function Tests: Recent Labs  Lab 03/09/20 0254 03/10/20 0053 03/11/20 0005 03/12/20 0421 03/15/20 1009  AST 27 52* 22 24 36  ALT ALKPHOS 66 64 57 59 70  BILITOT 1.0 1.8* 1.1 1.3* 1.1  PROT 5.8* 6.0* 6.1* 6.3* 6.3*  ALBUMIN 3.2* 3.3* 3.2* 3.3* 3.4*   Recent Labs  Lab 03/15/20 1009  LIPASE 21   No results for input(s): AMMONIA in the last 168 hours. Coagulation Profile: No results for input(s): INR, PROTIME in the last 168 hours. Cardiac Enzymes: No results for input(s): CKTOTAL, CKMB, CKMBINDEX, TROPONINI in the last 168 hours. BNP (last 3 results) No results for input(s): PROBNP in the last 8760 hours. HbA1C: No results for input(s): HGBA1C in the last 72 hours. CBG: No results for input(s): GLUCAP in the last 168 hours. Lipid Profile: No results for input(s): CHOL, HDL, LDLCALC, TRIG, CHOLHDL, LDLDIRECT in the last 72 hours. Thyroid Function Tests: No results for input(s): TSH, T4TOTAL, FREET4, T3FREE, THYROIDAB in the last 72 hours. Anemia Panel: No results for input(s): VITAMINB12, FOLATE, FERRITIN, TIBC, IRON, RETICCTPCT in the last 72 hours. Urine analysis:    Component Value Date/Time   COLORURINE YELLOW 08/12/2019 1606   APPEARANCEUR CLOUDY (A) 08/12/2019 1606   LABSPEC 1.018 08/12/2019 1606   PHURINE 9.0 (H) 08/12/2019 1606   GLUCOSEU NEGATIVE 08/12/2019 1606   HGBUR NEGATIVE 08/12/2019 1606   BILIRUBINUR NEGATIVE  08/12/2019 1606   KETONESUR NEGATIVE 08/12/2019 1606   PROTEINUR NEGATIVE 08/12/2019 1606   UROBILINOGEN 0.2 12/09/2009 0006   NITRITE NEGATIVE 08/12/2019 1606   LEUKOCYTESUR NEGATIVE 08/12/2019 1606     Jaan Fischel M.D. [facility] 03/15/2020, 3:52 PM   Call night coverage person covering after 7pm

## 2020-03-15 NOTE — Progress Notes (Signed)
Occupational Therapy Treatment Patient Details Name: Jordan Horne MRN: [id number] DOB: 19-Nov-1932 Today's Date: 03/15/2020    History of present illness Pt is an 86 y.o. female admitted 03/07/20 after fall down steps hitting the back of her head with no LOC. Pt sustained R 8-9th rib fxs. Incidental (+) COVID-19; pt fully vaccinated. CXR clear. PMH includes mild dementia.   OT comments  Pt seen in conjunction with PT to maximize pts activity tolerance and participation, however pt with decline in mentation this session noted to be more lethargic and required multimodal cues to follow commands. Pt required MOD- MAX A +2 to pivot from EOB>recliner. Called pts husband at end of session with pt not interacting with phone or attempting to communicate with husband. Pt would continue to benefit from skilled occupational therapy while admitted and after d/c to address the below listed limitations in order to improve overall functional mobility and facilitate independence with BADL participation. DC plan remains appropriate, will follow acutely per POC.    Follow Up Recommendations  SNF;Supervision/Assistance - 24 hour    Equipment Recommendations  Wheelchair (measurements OT);Wheelchair cushion (measurements OT)    Recommendations for Other Services      Precautions / Restrictions Precautions Precautions: Fall Restrictions Weight Bearing Restrictions: No       Mobility Bed Mobility Overal bed mobility: Needs Assistance Bed Mobility: Supine to Sit     Supine to sit: Max assist;+2 for physical assistance     General bed mobility comments: Max verbal/tactile cues and minA to initiate movement; ultimately requiring maxA+2 to elevate trunk and scoot hips to EOB  Transfers Overall transfer level: Needs assistance Equipment used: 2 person hand held assist Transfers: Sit to/from [employer] Sit to Stand: Mod assist;+2 physical assistance Stand pivot transfers: Mod  assist;+2 physical assistance       General transfer comment: Initial modA+2 to elevate trunk with HHA from EOB, pt taking pivotal steps to recliner with modA+2 for stability, max verbal cues for sequencing to sit    Balance Overall balance assessment: Needs assistance Sitting-balance support: Feet supported Sitting balance-Leahy Scale: Fair Sitting balance - Comments: Prolonged sitting at edge of bed and edge of recliner with forward flexed and guarded posture, able to extend trunk to achieve upright posture when cued, but not holding long   Standing balance support: Bilateral upper extremity supported;During functional activity Standing balance-Leahy Scale: Poor Standing balance comment: Reliant on UE support and external assist                           ADL either performed or assessed with clinical judgement   ADL Overall ADL's : Needs assistance/impaired                         Toilet Transfer: Moderate assistance;+2 for physical assistance;RW;Stand-pivot;Maximal assistance Toilet Transfer Details (indicate cue type and reason): MIN - MOD A  +2 for simulated toilet tranfser from EOB>recliner with RW.         Functional mobility during ADLs: Moderate assistance;Maximal assistance;+2 for physical assistance;Rolling walker General ADL Comments: pt with decrease in mentation needing MOD- MAX A +2 for simple stand pivot transfer from EOB>recliner with RW     Vision       Perception     Praxis      Cognition Arousal/Alertness: Awake/alert;Lethargic Behavior During Therapy: Flat affect Overall Cognitive Status: History of cognitive impairments - at baseline Area of  Impairment: Orientation;Attention;Memory;Following commands;Safety/judgement;Awareness;Problem solving                 Orientation Level: Disoriented to;Situation Current Attention Level: Focused Memory: Decreased recall of precautions;Decreased short-term memory Following  Commands: Follows one step commands inconsistently;Follows one step commands with increased time Safety/Judgement: Decreased awareness of safety;Decreased awareness of deficits Awareness: Intellectual Problem Solving: Slow processing;Decreased initiation;Difficulty sequencing;Requires verbal cues;Requires tactile cues General Comments: Following some commands. Becoming more awake with activity and answering some questions with minimal response, becoming less responsive and keeping eyes closed by end of session; not reacting to husband's name on phone and shaking head by end of session with no words spoken        Exercises     Shoulder Instructions       General Comments pt with flexed posture sitting EOB and c/o dizziness, therefore difficult to obtain accurate BP,BP from EOB 148/75 and from recliner 159/140. PT called pts husband during session in hopes of arousing pt however pt not communicating/ interacting with phone at all.    Pertinent Vitals/ Pain       Pain Assessment: Faces Faces Pain Scale: Hurts little more Pain Location: unable to state Pain Descriptors / Indicators: Guarding;Grimacing;Tiring Pain Intervention(s): Limited activity within patient's tolerance;Monitored during session;Repositioned  Home Living                                          Prior Functioning/Environment              Frequency  Min 2X/week        Progress Toward Goals  OT Goals(current goals can now be found in the care plan section)  Progress towards OT goals: Progressing toward goals  Acute Rehab OT Goals Patient Stated Goal: "I can't do it" OT Goal Formulation: Patient unable to participate in goal setting Time For Goal Achievement: 03/24/20 Potential to Achieve Goals: Good  Plan Discharge plan remains appropriate;Frequency remains appropriate    Co-evaluation      Reason for Co-Treatment: Complexity of the patient's impairments (multi-system  involvement);Necessary to address cognition/behavior during functional activity;For patient/therapist safety;To address functional/ADL transfers PT goals addressed during session: Mobility/safety with mobility;Balance        AM-PAC OT "6 Clicks" Daily Activity     Outcome Measure   Help from another person eating meals?: A Lot Help from another person taking care of personal grooming?: A Lot Help from another person toileting, which includes using toliet, bedpan, or urinal?: Total Help from another person bathing (including washing, rinsing, drying)?: A Lot Help from another person to put on and taking off regular upper body clothing?: A Lot Help from another person to put on and taking off regular lower body clothing?: Total 6 Click Score: 10    End of Session Equipment Utilized During Treatment: Gait belt;Rolling walker  OT Visit Diagnosis: Unsteadiness on feet (R26.81);Other abnormalities of gait and mobility (R26.89);Muscle weakness (generalized) (M62.81);Other symptoms and signs involving cognitive function   Activity Tolerance Patient tolerated treatment well;Patient limited by lethargy   Patient Left in chair;with call bell/phone within reach;with chair alarm set   Nurse Communication Mobility status        Time: 7616-0737 OT Time Calculation (min): 35 min  Charges: OT General Charges $OT Visit: 1 Visit OT Treatments $Therapeutic Activity: 8-22 mins  Audery Amel., COTA/L Acute Rehabilitation Services [phone]  Angelina Pih 03/15/2020, 1:43 PM

## 2020-03-16 DIAGNOSIS — N390 Urinary tract infection, site not specified: Secondary | ICD-10-CM

## 2020-03-16 LAB — COMPREHENSIVE METABOLIC PANEL
ALT: 23 U/L (ref 0–44)
ALT: 28 U/L (ref 0–44)
AST: 24 U/L (ref 15–41)
AST: 33 U/L (ref 15–41)
Albumin: 2.5 g/dL — ABNORMAL LOW (ref 3.5–5.0)
Albumin: 2.9 g/dL — ABNORMAL LOW (ref 3.5–5.0)
Alkaline Phosphatase: 49 U/L (ref 38–126)
Alkaline Phosphatase: 61 U/L (ref 38–126)
Anion gap: 8 (ref 5–15)
Anion gap: 9 (ref 5–15)
BUN: 22 mg/dL (ref 8–23)
BUN: 21 mg/dL (ref 8–23)
CO2: 23 mmol/L (ref 22–32)
CO2: 23 mmol/L (ref 22–32)
Calcium: 8 mg/dL — ABNORMAL LOW (ref 8.9–10.3)
Calcium: 8.2 mg/dL — ABNORMAL LOW (ref 8.9–10.3)
Chloride: 102 mmol/L (ref 98–111)
Chloride: 103 mmol/L (ref 98–111)
Creatinine, Ser: 1.13 mg/dL — ABNORMAL HIGH (ref 0.44–1.00)
Creatinine, Ser: 0.95 mg/dL (ref 0.44–1.00)
GFR, Estimated: 48 mL/min — ABNORMAL LOW (ref >60)
GFR, Estimated: 59 mL/min — ABNORMAL LOW (ref >60)
Glucose, Bld: 113 mg/dL — ABNORMAL HIGH (ref 70–99)
Glucose, Bld: 93 mg/dL (ref 70–99)
Potassium: 3.1 mmol/L — ABNORMAL LOW (ref 3.5–5.1)
Potassium: 4.5 mmol/L (ref 3.5–5.1)
Sodium: 133 mmol/L — ABNORMAL LOW (ref 135–145)
Sodium: 135 mmol/L (ref 135–145)
Total Bilirubin: 0.4 mg/dL (ref 0.3–1.2)
Total Bilirubin: 0.8 mg/dL (ref 0.3–1.2)
Total Protein: 4.9 g/dL — ABNORMAL LOW (ref 6.5–8.1)
Total Protein: 5.4 g/dL — ABNORMAL LOW (ref 6.5–8.1)

## 2020-03-16 LAB — CBC
HCT: 25.8 % — ABNORMAL LOW (ref 36.0–46.0)
HCT: 29.9 % — ABNORMAL LOW (ref 36.0–46.0)
Hemoglobin: 8.7 g/dL — ABNORMAL LOW (ref 12.0–15.0)
Hemoglobin: 10 g/dL — ABNORMAL LOW (ref 12.0–15.0)
MCH: 32 pg (ref 26.0–34.0)
MCH: 31.7 pg (ref 26.0–34.0)
MCHC: 33.7 g/dL (ref 30.0–36.0)
MCHC: 33.4 g/dL (ref 30.0–36.0)
MCV: 94.9 fL (ref 80.0–100.0)
MCV: 94.9 fL (ref 80.0–100.0)
Platelets: 165 10*3/uL (ref 150–400)
Platelets: 199 10*3/uL (ref 150–400)
RBC: 2.72 MIL/uL — ABNORMAL LOW (ref 3.87–5.11)
RBC: 3.15 MIL/uL — ABNORMAL LOW (ref 3.87–5.11)
RDW: 14 % (ref 11.5–15.5)
RDW: 14.1 % (ref 11.5–15.5)
WBC: 8.2 10*3/uL (ref 4.0–10.5)
WBC: 9.5 10*3/uL (ref 4.0–10.5)
nRBC: 0 % (ref 0.0–0.2)
nRBC: 0 % (ref 0.0–0.2)

## 2020-03-16 LAB — TYPE AND SCREEN
ABO/RH(D): O POS
Antibody Screen: NEGATIVE

## 2020-03-16 LAB — PROCALCITONIN: Procalcitonin: <0.1 ng/mL

## 2020-03-16 LAB — TSH: TSH: 14.556 u[IU]/mL — ABNORMAL HIGH (ref 0.350–4.500)

## 2020-03-16 LAB — HEMOGLOBIN AND HEMATOCRIT, BLOOD
HCT: 27.2 % — ABNORMAL LOW (ref 36.0–46.0)
Hemoglobin: 9.5 g/dL — ABNORMAL LOW (ref 12.0–15.0)

## 2020-03-16 LAB — VITAMIN B12: Vitamin B-12: 276 pg/mL (ref 180–914)

## 2020-03-16 MED ORDER — ASPIRIN EC 81 MG PO TBEC
81.0000 mg | DELAYED_RELEASE_TABLET | Freq: Every day | ORAL | Status: DC
  Administered 2020-03-17 – 2020-03-30 (×14): 81 mg via ORAL
  Filled 2020-03-16 (×14): qty 1

## 2020-03-16 MED ORDER — PANTOPRAZOLE SODIUM 40 MG PO TBEC
40.0000 mg | DELAYED_RELEASE_TABLET | Freq: Two times a day (BID) | ORAL | Status: DC
  Administered 2020-03-16 – 2020-03-30 (×28): 40 mg via ORAL
  Filled 2020-03-16 (×29): qty 1

## 2020-03-16 MED ORDER — POTASSIUM CHLORIDE 20 MEQ PO PACK
40.0000 meq | PACK | Freq: Once | ORAL | Status: AC
  Administered 2020-03-16: 05:00:00 40 meq via ORAL
  Filled 2020-03-16: qty 2

## 2020-03-16 MED ORDER — ENOXAPARIN SODIUM 40 MG/0.4ML ~~LOC~~ SOLN
40.0000 mg | Freq: Every day | SUBCUTANEOUS | Status: DC
  Administered 2020-03-17 – 2020-03-22 (×6): 40 mg via SUBCUTANEOUS
  Filled 2020-03-16 (×6): qty 0.4

## 2020-03-16 MED ORDER — SODIUM CHLORIDE 0.9 % IV SOLN
1.0000 g | INTRAVENOUS | Status: DC
  Administered 2020-03-16 – 2020-03-22 (×7): 1 g via INTRAVENOUS
  Filled 2020-03-16: qty 1
  Filled 2020-03-16: qty 10
  Filled 2020-03-16: qty 1
  Filled 2020-03-16 (×5): qty 10

## 2020-03-16 MED ORDER — CYANOCOBALAMIN 1000 MCG/ML IJ SOLN
1000.0000 ug | INTRAMUSCULAR | Status: DC
  Administered 2020-03-19 – 2020-03-26 (×2): 1000 ug via INTRAMUSCULAR
  Filled 2020-03-16 (×2): qty 1

## 2020-03-16 MED ORDER — CYANOCOBALAMIN 1000 MCG/ML IJ SOLN
1000.0000 ug | Freq: Every day | INTRAMUSCULAR | Status: AC
  Administered 2020-03-16 – 2020-03-18 (×3): 1000 ug via INTRAMUSCULAR
  Filled 2020-03-16 (×3): qty 1

## 2020-03-16 MED ORDER — LEVOTHYROXINE SODIUM 100 MCG PO TABS
100.0000 ug | ORAL_TABLET | Freq: Every day | ORAL | Status: DC
  Administered 2020-03-17 – 2020-03-30 (×14): 100 ug via ORAL
  Filled 2020-03-16 (×14): qty 1

## 2020-03-16 NOTE — Progress Notes (Addendum)
[facility]                                                                              Patient Demographics  Jordan Horne, is a 86 y.o. female, DOB - [birth year], CWC:[id number]  Admit date - 03/07/2020   Admitting Physician Ripudeep Jenna Luo, MD  Outpatient Primary MD for the patient is Dema Severin, NP  Outpatient specialists:   LOS - 8  days   Medical records reviewed and are as summarized below:    chief complain: Fall with moderate to severe pain in the back  Brief summary   Jordan Horne is a 86 y.o. female with medical history significant for mild dementia, HLD, hypothyroidism who presents after a fall at home. She was walking at home today when she fell down 3 steps at about 4:30 PM. Patient states she just tripped and fell. Patient states she hit the back of her head and landed on her right side. She is having moderate to severe pain in the back of her head and neck. Patient states she is having pain also on the right side of her ribs. She did not take anything for the pain at home.She denies any shortness of breath. No abdominal pain. She did not have LOC. No seizure activity reported.  Per husband, patient has completed both Covid vaccinations x2 and had received booster last week as well.   Subjective:   Alissah Cooley was seen and examined today.  She remains confused, she denies any complaints this morning.    Assessment & Plan      Multiple fractures of ribs, right side, initial encounter for closed fracture -Secondary to mechanical fall at home, fractures of right eighth, ninth ribs with intractable pain -Lumbar spine x-ray negative for any fractures -PT evaluation recommended SNF versus home health PT with 24/7 support.  Family requested SNF, social work consult placed -She is on scheduled Tylenol, Lidoderm patch, I and as needed p.o. oxycodone .  Acute metabolic encephalopathy - it does appear she did start to develop some  dementia over last few months. -B12 on the lower side, will start on supplement. -She has an elevated, will increase her levothyroxine from 75 mcg to 100 mcg. -She has UTI as well will treat.  -CT head with no acute findings -He is at hospital delirium as well, started on low-dose Seroquel evening time.  -Continue with IV fluids given poor oral intake/  Hypkalemia/hyponatremia -Continue with IV fluids  COVID-19 positive -Incidental COVID-19 positive on admission. Has pleuritic chest pain from the fractures of right eighth ninth ribs.  Had vaccination x2, booster last week.   -Continue pain control incentive spirometry, O2 sats 98% on room air -Chest x-ray clear on 12/14 -Per pharmacy, does not meet criteria for Mab infusion due to partially or fully vaccinated for COVID-19, asymptomatic with a cycle time> 32 -Currently stable, O2 sats 99% on room air, asymptomatic -D-dimers is elevated, but trending down, Doppler negative for DVT.   - will need isolation for 10 days .  Hyperlipidemia Continue statin  Hypothyroidism -TSH elevated at 14, will increase her Synthroid from 75 to  100 mcg .  Urinary retention/UTI -Required and out x3, Foley catheter inserted  -She has positive UA, so started on IV Rocephin  Code Status: Full CODE STATUS DVT Prophylaxis:  Lovenox  Family Communication: Discussed with husband by phone12/20/2021, 12/22, 12/23   Disposition Plan:     Status is: Inpatient  The patient will require care spanning > 2 midnights and Dispo: The patient is from: Home              Anticipated d/c is to: SNF              Anticipated d/c date is: 2 days              Patient currently is not medically stable to d/c.   Man significantly confused.     Procedures:  None Consultants:   None  Antimicrobials:   Anti-infectives (From admission, onward)   Start     Dose/Rate Route Frequency Ordered Stop   03/16/20 0800  cefTRIAXone (ROCEPHIN) 1 g in sodium chloride 0.9 % 100  mL IVPB        1 g 200 mL/hr over 30 Minutes Intravenous Every 24 hours 03/16/20 0713           Medications  Scheduled Meds: . (feeding supplement) PROSource Plus  30 mL Oral BID BM  . acetaminophen  650 mg Oral TID  . bisacodyl  10 mg Rectal Once  . Chlorhexidine Gluconate Cloth  6 each Topical Daily  . cyanocobalamin  1,000 mcg Intramuscular Daily   Followed by  . [START ON 03/19/2020] cyanocobalamin  1,000 mcg Intramuscular Weekly  . feeding supplement  237 mL Oral TID BM  . [START ON 03/17/2020] levothyroxine  100 mcg Oral Daily  . lidocaine  1 patch Transdermal Q24H  . pantoprazole  40 mg Oral BID AC  . QUEtiapine  25 mg Oral QHS  . rosuvastatin  10 mg Oral Daily  . senna-docusate  2 tablet Oral BID   Continuous Infusions: . 0.9 % NaCl with KCl 20 mEq / L Stopped (03/16/20 1610)  . cefTRIAXone (ROCEPHIN)  IV 1 g (03/16/20 0958)   PRN Meds:.acetaminophen **OR** acetaminophen, oxyCODONE       Objective:   Vitals:   03/15/20 1430 03/15/20 2008 03/16/20 0516 03/16/20 1251  BP: 130/66 109/74 134/71 138/76  Pulse: 88 94 76 83  Resp: 17 18 16 15   Temp: 98 F (36.7 C) 98.9 F (37.2 C) 99 F (37.2 C) 99 F (37.2 C)  TempSrc: Axillary Axillary Oral Oral  SpO2: 92% 93% 95% 95%  Weight:      Height:        Intake/Output Summary (Last 24 hours) at 03/16/2020 1341 Last data filed at 03/16/2020 1046 Gross per 24 hour  Intake 1284.14 ml  Output 450 ml  Net 834.14 ml     Wt Readings from Last 3 Encounters:  03/10/20 84.4 kg  12/02/19 72.2 kg  09/09/19 73.5 kg   Physical Exam  Awake Alert, extremely frail, deconditioned, poor cognition and insight, significantly confused  Symmetrical Chest wall movement, Good air movement bilaterally, CTAB RRR,No Gallops,Rubs or new Murmurs, No Parasternal Heave +ve B.Sounds, Abd Soft, No tenderness, No rebound - guarding or rigidity. No Cyanosis, Clubbing or edema, No new Rash or bruise    Data Reviewed:  I have  personally reviewed following labs and imaging studies  Micro Results Recent Results (from the past 240 hour(s))  Resp Panel by RT-PCR (Flu A&B, Covid) Nasopharyngeal Swab  Status: Abnormal   Collection Time: 03/07/20 11:08 PM   Specimen: Nasopharyngeal Swab; Nasopharyngeal(NP) swabs in vial transport medium  Result Value Ref Range Status   SARS Coronavirus 2 by RT PCR POSITIVE (A) NEGATIVE Final    Comment: RESULT CALLED TO, READ BACK BY AND VERIFIED WITH: Laurice Record, A RN 03/08/20 at 1224 sk (NOTE) SARS-CoV-2 target nucleic acids are DETECTED.  The SARS-CoV-2 RNA is generally detectable in upper respiratory specimens during the acute phase of infection. Positive results are indicative of the presence of the identified virus, but do not rule out bacterial infection or co-infection with other pathogens not detected by the test. Clinical correlation with patient history and other diagnostic information is necessary to determine patient infection status. The expected result is Negative.  Fact Sheet for Patients: BloggerCourse.com  Fact Sheet for Healthcare Providers: SeriousBroker.it  This test is not yet approved or cleared by the [place] FDA and  has been authorized for detection and/or diagnosis of SARS-CoV-2 by FDA under an Emergency Use Authorization (EUA).  This EUA will remain in effect (meaning this test can be  used) for the duration of  the COVID-19 declaration under Section 564(b)(1) of the Act, 21 U.S.C. section 360bbb-3(b)(1), unless the authorization is terminated or revoked sooner.     Influenza A by PCR NEGATIVE NEGATIVE Final   Influenza B by PCR NEGATIVE NEGATIVE Final    Comment: (NOTE) The Xpert Xpress SARS-CoV-2/FLU/RSV plus assay is intended as an aid in the diagnosis of influenza from Nasopharyngeal swab specimens and should not be used as a sole basis for treatment. Nasal washings and aspirates are  unacceptable for Xpert Xpress SARS-CoV-2/FLU/RSV testing.  Fact Sheet for Patients: BloggerCourse.com  Fact Sheet for Healthcare Providers: SeriousBroker.it  This test is not yet approved or cleared by the [place] FDA and has been authorized for detection and/or diagnosis of SARS-CoV-2 by FDA under an Emergency Use Authorization (EUA). This EUA will remain in effect (meaning this test can be used) for the duration of the COVID-19 declaration under Section 564(b)(1) of the Act, 21 U.S.C. section 360bbb-3(b)(1), unless the authorization is terminated or revoked.  Performed at [facility], [address], [place], [place] [address]     Radiology Reports DG Ribs Unilateral W/Chest Right  Result Date: 03/07/2020 CLINICAL DATA:  Fall, right chest pain EXAM: RIGHT RIBS AND CHEST - 3+ VIEW COMPARISON:  None. FINDINGS: Single view radiograph of the chest and two view radiograph of the right ribs demonstrates acute minimally displaced fractures of the right eighth and ninth ribs posteriorly. Minimal left basilar atelectasis or infiltrate. Lungs are otherwise clear. No pneumothorax or pleural effusion. Cardiac size is at the upper limits of normal. Pulmonary vascularity is normal. IMPRESSION: Acute minimally displaced right 8, 9 rib fractures. No pneumothorax. Electronically Signed   By: Helyn Numbers MD   On: 03/07/2020 20:05   DG Thoracic Spine 2 View  Result Date: 03/07/2020 CLINICAL DATA:  Fall, back pain EXAM: THORACIC SPINE 2 VIEWS COMPARISON:  04/24/2018 FINDINGS: Two view radiograph thoracic spine demonstrates normal thoracic kyphosis. Mild thoracic dextroscoliosis, apex right at T5, unchanged. No acute fracture or listhesis of the thoracic spine. Vertebral body height has been preserved. There is diffuse intervertebral disc space narrowing and endplate remodeling throughout the thoracic spine in keeping with changes of  diffuse severe degenerative disc disease. The paraspinal soft tissues are unremarkable. Incidental note is made of a small hiatal hernia. IMPRESSION: No acute fracture or listhesis. Electronically Signed  By: Helyn Numbers MD   On: 03/07/2020 20:02   DG Lumbar Spine Complete  Result Date: 03/07/2020 CLINICAL DATA:  Fall, back pain EXAM: LUMBAR SPINE - COMPLETE 4+ VIEW COMPARISON:  None. FINDINGS: Five view radiograph lumbar spine. Normal lumbar lordosis. No acute fracture or listhesis of the lumbar spine. Vertebral body height has been preserved. There is diffuse severe intervertebral disc space narrowing and endplate remodeling with multilevel vacuum disc phenomena in keeping with changes of diffuse severe degenerative disc disease throughout the lumbar spine. Oblique views demonstrate no evidence of pars defect. Paraspinal soft tissues are unremarkable. IMPRESSION: No acute fracture or listhesis. Electronically Signed   By: Helyn Numbers MD   On: 03/07/2020 20:06   DG Abd 1 View  Result Date: 03/15/2020 CLINICAL DATA:  Nausea and vomiting EXAM: ABDOMEN - 1 VIEW COMPARISON:  08/13/2018 FINDINGS: Bowel gas pattern is unremarkable. Cholecystectomy clips. No acute osseous abnormality. IMPRESSION: Normal bowel gas pattern. Electronically Signed   By: Guadlupe Spanish M.D.   On: 03/15/2020 10:41   CT HEAD WO CONTRAST  Result Date: 03/15/2020 CLINICAL DATA:  Delirium EXAM: CT HEAD WITHOUT CONTRAST TECHNIQUE: Contiguous axial images were obtained from the base of the skull through the vertex without intravenous contrast. COMPARISON:  03/07/2020 FINDINGS: Clinical note: Examination is mildly degraded by patient motion. Brain: No evidence of acute infarction, hemorrhage, hydrocephalus, extra-axial collection or mass lesion/mass effect. Mild low-density changes within the periventricular and subcortical white matter compatible with chronic microvascular ischemic change. Mild diffuse cerebral volume loss.  Vascular: Atherosclerotic calcifications involving the large vessels of the skull base. No unexpected hyperdense vessel. Skull: No evidence of calvarial fracture. Sinuses/Orbits: No acute finding. Other: None. IMPRESSION: 1. No acute intracranial findings. 2. Chronic microvascular ischemic change and cerebral volume loss. Electronically Signed   By: Duanne Guess D.O.   On: 03/15/2020 16:50   CT Head Wo Contrast  Result Date: 03/07/2020 CLINICAL DATA:  Head trauma EXAM: CT HEAD WITHOUT CONTRAST TECHNIQUE: Contiguous axial images were obtained from the base of the skull through the vertex without intravenous contrast. COMPARISON:  CT brain 08/12/2019 FINDINGS: Brain: No acute territorial infarction, hemorrhage, or intracranial mass. Mild atrophy. Mild chronic small vessel ischemic changes of the white matter. Chronic appearing lacunar infarcts in the basal ganglia. Stable punctate calcifications in the left periventricular white matter. Stable ventricle size. Vascular: No hyperdense vessels.  Carotid vascular calcification Skull: Normal. Negative for fracture or focal lesion. Sinuses/Orbits: Mild mucosal thickening in the sinuses Other: None IMPRESSION: 1. No CT evidence for acute intracranial abnormality. 2. Atrophy and mild chronic small vessel ischemic changes of the white matter. Electronically Signed   By: Jasmine Pang M.D.   On: 03/07/2020 20:21   CT Cervical Spine Wo Contrast  Result Date: 03/07/2020 CLINICAL DATA:  Trauma EXAM: CT CERVICAL SPINE WITHOUT CONTRAST TECHNIQUE: Multidetector CT imaging of the cervical spine was performed without intravenous contrast. Multiplanar CT image reconstructions were also generated. COMPARISON:  08/12/2019 FINDINGS: Alignment: Stable alignment with trace retrolisthesis C5 on C6 and trace anterolisthesis C6 on C7. Facet alignment is maintained. Skull base and vertebrae: No acute fracture. No primary bone lesion or focal pathologic process. Soft tissues and  spinal canal: No prevertebral fluid or swelling. No visible canal hematoma. Disc levels: Multiple level degenerative changes with moderate to marked disease at C4-C5 and C5-C6. Facet degenerative change at multiple levels. Upper chest: Negative.  No appreciable thyroid tissue identified. Other: None IMPRESSION: Stable alignment of the cervical spine with  degenerative changes. No acute osseous abnormality. Electronically Signed   By: Jasmine Pang M.D.   On: 03/07/2020 20:26   VAS [place] LOWER EXTREMITY VENOUS (DVT)  Result Date: 03/13/2020  Lower Venous DVT Study Other Indications: Covid, Elevated D-Dimer. Comparison Study: No previous exam Performing Technologist: Clint Guy RVT  Examination Guidelines: A complete evaluation includes B-mode imaging, spectral Doppler, color Doppler, and power Doppler as needed of all accessible portions of each vessel. Bilateral testing is considered an integral part of a complete examination. Limited examinations for reoccurring indications may be performed as noted. The reflux portion of the exam is performed with the patient in reverse Trendelenburg.  +---------+---------------+---------+-----------+----------+--------------+ RIGHT    CompressibilityPhasicitySpontaneityPropertiesThrombus Aging +---------+---------------+---------+-----------+----------+--------------+ CFV      Full           Yes      Yes                                 +---------+---------------+---------+-----------+----------+--------------+ SFJ      Full                                                        +---------+---------------+---------+-----------+----------+--------------+ FV Prox  Full                                                        +---------+---------------+---------+-----------+----------+--------------+ FV Mid   Full                                                        +---------+---------------+---------+-----------+----------+--------------+ FV  DistalFull                                                        +---------+---------------+---------+-----------+----------+--------------+ PFV      Full                                                        +---------+---------------+---------+-----------+----------+--------------+ POP      Full           Yes      Yes                                 +---------+---------------+---------+-----------+----------+--------------+ PTV      Full                                                        +---------+---------------+---------+-----------+----------+--------------+  PERO     Full                                                        +---------+---------------+---------+-----------+----------+--------------+   +---------+---------------+---------+-----------+----------+--------------+ LEFT     CompressibilityPhasicitySpontaneityPropertiesThrombus Aging +---------+---------------+---------+-----------+----------+--------------+ CFV      Full           Yes      Yes                                 +---------+---------------+---------+-----------+----------+--------------+ SFJ      Full                                                        +---------+---------------+---------+-----------+----------+--------------+ FV Prox  Full                                                        +---------+---------------+---------+-----------+----------+--------------+ FV Mid   Full                                                        +---------+---------------+---------+-----------+----------+--------------+ FV DistalFull                                                        +---------+---------------+---------+-----------+----------+--------------+ PFV      Full                                                        +---------+---------------+---------+-----------+----------+--------------+ POP      Full           Yes      Yes                                  +---------+---------------+---------+-----------+----------+--------------+ PTV      Full                                                        +---------+---------------+---------+-----------+----------+--------------+ PERO     Full                                                        +---------+---------------+---------+-----------+----------+--------------+  Summary: RIGHT: - There is no evidence of deep vein thrombosis in the lower extremity.  - No cystic structure found in the popliteal fossa.  LEFT: - There is no evidence of deep vein thrombosis in the lower extremity.  - No cystic structure found in the popliteal fossa.  *See table(s) above for measurements and observations. Electronically signed by Waverly Ferrarihristopher Dickson MD on 03/13/2020 at 4:35:46 PM.    Final     Lab Data:  CBC: Recent Labs  Lab 03/13/20 0419 03/14/20 0801 03/15/20 1009 03/16/20 0106 03/16/20 0428 03/16/20 0806  WBC 10.7* 11.7* 12.1* 8.2  --  9.5  HGB 14.1 11.4* 11.1* 8.7* 9.5* 10.0*  HCT 42.8 34.3* 32.3* 25.8* 27.2* 29.9*  MCV 95.7 94.2 92.8 94.9  --  94.9  PLT 170 123* 201 165  --  199   Basic Metabolic Panel: Recent Labs  Lab 03/12/20 0421 03/13/20 0419 03/15/20 1009 03/16/20 0106 03/16/20 0806  NA 131* 133* 130* 133* 135  K 3.4* 3.9 3.2* 3.1* 4.5  CL 100 99 95* 102 103  CO2 21* 24 24 23 23   GLUCOSE 95 88 124* 113* 93  BUN 11 20 19 22 21   CREATININE 0.76 0.91 0.71 1.13* 0.95  CALCIUM 8.7* 9.1 8.6* 8.0* 8.2*   GFR: Estimated Creatinine Clearance: 44.6 mL/min (by C-G formula based on SCr of 0.95 mg/dL). Liver Function Tests: Recent Labs  Lab 03/11/20 0005 03/12/20 0421 03/15/20 1009 03/16/20 0106 03/16/20 0806  AST 22 24 36 24 33  ALT 22 20 28 23 28   ALKPHOS 57 59 70 49 61  BILITOT 1.1 1.3* 1.1 0.4 0.8  PROT 6.1* 6.3* 6.3* 4.9* 5.4*  ALBUMIN 3.2* 3.3* 3.4* 2.5* 2.9*   Recent Labs  Lab 03/15/20 1009  LIPASE 21   No results for input(s):  AMMONIA in the last 168 hours. Coagulation Profile: No results for input(s): INR, PROTIME in the last 168 hours. Cardiac Enzymes: No results for input(s): CKTOTAL, CKMB, CKMBINDEX, TROPONINI in the last 168 hours. BNP (last 3 results) No results for input(s): PROBNP in the last 8760 hours. HbA1C: No results for input(s): HGBA1C in the last 72 hours. CBG: No results for input(s): GLUCAP in the last 168 hours. Lipid Profile: No results for input(s): CHOL, HDL, LDLCALC, TRIG, CHOLHDL, LDLDIRECT in the last 72 hours. Thyroid Function Tests: Recent Labs    03/16/20 0106  TSH 14.556*   Anemia Panel: Recent Labs    03/16/20 0106  VITAMINB12 276   Urine analysis:    Component Value Date/Time   COLORURINE AMBER (A) 03/15/2020 1722   APPEARANCEUR HAZY (A) 03/15/2020 1722   LABSPEC 1.023 03/15/2020 1722   PHURINE 5.0 03/15/2020 1722   GLUCOSEU NEGATIVE 03/15/2020 1722   HGBUR SMALL (A) 03/15/2020 1722   BILIRUBINUR NEGATIVE 03/15/2020 1722   KETONESUR 5 (A) 03/15/2020 1722   PROTEINUR 30 (A) 03/15/2020 1722   UROBILINOGEN 0.2 12/09/2009 0006   NITRITE NEGATIVE 03/15/2020 1722   LEUKOCYTESUR TRACE (A) 03/15/2020 1722     Sears Oran M.D. [facility] 03/16/2020, 1:41 PM   Call night coverage person covering after 7pm

## 2020-03-16 NOTE — Care Management Important Message (Signed)
Important Message  Patient Details  Name: Jordan Horne MRN: [id number] Date of Birth: 23-Dec-1932   Medicare Important Message Given:  Yes - Important Message mailed due to current National Emergency  Verbal consent obtained due to current National Emergency  Relationship to patient: Self Contact Name: Mishael Krysiak Call Date: 03/16/20  Time: 1355 Phone: [phone] Outcome: No Answer/Busy Important Message mailed to: Patient address on file    Orson Aloe 03/16/2020, 1:55 PM

## 2020-03-17 LAB — COMPREHENSIVE METABOLIC PANEL
ALT: 29 U/L (ref 0–44)
AST: 36 U/L (ref 15–41)
Albumin: 2.9 g/dL — ABNORMAL LOW (ref 3.5–5.0)
Alkaline Phosphatase: 62 U/L (ref 38–126)
Anion gap: 9 (ref 5–15)
BUN: 14 mg/dL (ref 8–23)
CO2: 21 mmol/L — ABNORMAL LOW (ref 22–32)
Calcium: 8.1 mg/dL — ABNORMAL LOW (ref 8.9–10.3)
Chloride: 108 mmol/L (ref 98–111)
Creatinine, Ser: 0.66 mg/dL (ref 0.44–1.00)
GFR, Estimated: >60 mL/min (ref >60)
Glucose, Bld: 86 mg/dL (ref 70–99)
Potassium: 4.2 mmol/L (ref 3.5–5.1)
Sodium: 138 mmol/L (ref 135–145)
Total Bilirubin: 1.1 mg/dL (ref 0.3–1.2)
Total Protein: 5.6 g/dL — ABNORMAL LOW (ref 6.5–8.1)

## 2020-03-17 LAB — CBC
HCT: 31 % — ABNORMAL LOW (ref 36.0–46.0)
Hemoglobin: 10.4 g/dL — ABNORMAL LOW (ref 12.0–15.0)
MCH: 32.1 pg (ref 26.0–34.0)
MCHC: 33.5 g/dL (ref 30.0–36.0)
MCV: 95.7 fL (ref 80.0–100.0)
Platelets: 196 10*3/uL (ref 150–400)
RBC: 3.24 MIL/uL — ABNORMAL LOW (ref 3.87–5.11)
RDW: 14.2 % (ref 11.5–15.5)
WBC: 8.8 10*3/uL (ref 4.0–10.5)
nRBC: 0 % (ref 0.0–0.2)

## 2020-03-17 LAB — PROCALCITONIN: Procalcitonin: <0.1 ng/mL

## 2020-03-17 NOTE — Progress Notes (Signed)
Physical Therapy Treatment Patient Details Name: Jordan Horne MRN: [id number] DOB: 12/07/1932 Today's Date: 03/17/2020    History of Present Illness Pt is an 86 y.o. female admitted 03/07/20 after fall down steps hitting the back of her head with no LOC. Pt sustained R 8-9th rib fxs. Incidental (+) COVID-19; pt fully vaccinated. CXR clear. PMH includes mild dementia.   PT Comments    Pt with improved alertness and ability to participate this session. Pleasantly confused requiring increased time and cues to follow commands and attend to task. Tolerated transfer training and ambulation short distance with RW, requiring up to modA for mobility. Continue to recommend SNF-level therapies to maximize functional mobility and independence.  SpO2 98% on RA HR 80s-90s Post-ambulation BP 129/66    Follow Up Recommendations  SNF;Supervision/Assistance - 24 hour     Equipment Recommendations  3in1 (PT);Wheelchair (measurements PT);Wheelchair cushion (measurements PT)    Recommendations for Other Services       Precautions / Restrictions Precautions Precautions: Fall Restrictions Weight Bearing Restrictions: No    Mobility  Bed Mobility               General bed mobility comments: Received sitting in recliner  Transfers Overall transfer level: Needs assistance Equipment used: Rolling walker (2 wheeled) Transfers: Sit to/from Stand Sit to Stand: Mod assist         General transfer comment: Increased time and effort preparing to stand requiring max verbal cues for sequencing; modA to assist trunk elevation and prevent posterior LOB upon standing  Ambulation/Gait Ambulation/Gait assistance: Min assist Gait Distance (Feet): 20 Feet Assistive device: Rolling walker (2 wheeled) Gait Pattern/deviations: Step-to pattern;Shuffle;Trunk flexed;Antalgic Gait velocity: Decreased   General Gait Details: Slow, unsteady gait with RW and consistent minA to maintain balance; verbal  cues to maintain closer proximity to RW, at times requiring modA for RW management; limited by fatigue   Stairs             Wheelchair Mobility    Modified Rankin (Stroke Patients Only)       Balance Overall balance assessment: Needs assistance Sitting-balance support: No upper extremity supported;Feet unsupported;Single extremity supported Sitting balance-Leahy Scale: Poor Sitting balance - Comments: Reliant on UE support to maintain static sitting at EOB; requires assist to translate weight anteriorly     Standing balance-Leahy Scale: Poor Standing balance comment: Reliant on UE support                            Cognition Arousal/Alertness: Awake/alert Behavior During Therapy: WFL for tasks assessed/performed Overall Cognitive Status: History of cognitive impairments - at baseline Area of Impairment: Orientation;Attention;Memory;Following commands;Safety/judgement;Awareness;Problem solving                 Orientation Level: Disoriented to;Place;Time;Situation Current Attention Level: Sustained Memory: Decreased recall of precautions;Decreased short-term memory Following Commands: Follows one step commands inconsistently;Follows one step commands with increased time Safety/Judgement: Decreased awareness of safety;Decreased awareness of deficits Awareness: Intellectual Problem Solving: Slow processing;Decreased initiation;Difficulty sequencing;Requires verbal cues;Requires tactile cues General Comments: Much improved alertness and ability to participate this session. Pt pleasantly confused, requiring increased time and inconsistently following commands. Multiple attempts to reorient      Exercises Other Exercises Other Exercises: IS x5 (pt with good technique) - pulling ~750 mL    General Comments General comments (skin integrity, edema, etc.): Post-ambulation BP 129/66, SpO2 98% on RA, HR 80s-90s      Pertinent Vitals/Pain Pain Assessment:  Faces Faces Pain Scale: Hurts little more Pain Location: Abdomen when coughing Pain Descriptors / Indicators: Guarding;Grimacing;Moaning Pain Intervention(s): Limited activity within patient's tolerance;Monitored during session    Home Living                      Prior Function            PT Goals (current goals can now be found in the care plan section) Progress towards PT goals: Progressing toward goals    Frequency    Min 2X/week      PT Plan Current plan remains appropriate    Co-evaluation              AM-PAC PT "6 Clicks" Mobility   Outcome Measure  Help needed turning from your back to your side while in a flat bed without using bedrails?: A Little Help needed moving from lying on your back to sitting on the side of a flat bed without using bedrails?: A Little Help needed moving to and from a bed to a chair (including a wheelchair)?: A Lot Help needed standing up from a chair using your arms (e.g., wheelchair or bedside chair)?: A Lot Help needed to walk in hospital room?: A Little Help needed climbing 3-5 steps with a railing? : A Lot 6 Click Score: 15    End of Session Equipment Utilized During Treatment: Gait belt Activity Tolerance: Patient tolerated treatment well;Patient limited by fatigue Patient left: in chair;with call bell/phone within reach;with chair alarm set Nurse Communication: Mobility status PT Visit Diagnosis: Other abnormalities of gait and mobility (R26.89);Pain;Muscle weakness (generalized) (M62.81)     Time: 6160-7371 PT Time Calculation (min) (ACUTE ONLY): 20 min  Charges:  $Therapeutic Activity: 8-22 mins                    [name], PT, DPT Acute Rehabilitation Services  Pager [phone] Office [phone]  [name] 03/17/2020, 3:55 PM

## 2020-03-17 NOTE — Progress Notes (Signed)
[facility]                                                                              Patient Demographics  Jordan Horne, is a 86 y.o. female, DOB - [birth year], LKG:[id number]  Admit date - 03/07/2020   Admitting Physician Ripudeep Jenna LuoK Rai, MD  Outpatient Primary MD for the patient is Dema SeverinYork, Regina F, NP  Outpatient specialists:   LOS - 9  days   Medical records reviewed and are as summarized below:  Brief summary   Jordan Horne is a 86 y.o. female with medical history significant for mild dementia, HLD, hypothyroidism who presents after a fall at home. She was walking at home today when she fell down 3 steps at about 4:30 PM. Patient states she just tripped and fell. Patient states she hit the back of her head and landed on her right side. She is having moderate to severe pain in the back of her head and neck. Patient states she is having pain also on the right side of her ribs. She did not take anything for the pain at home.She denies any shortness of breath. No abdominal pain. She did not have LOC. No seizure activity reported.  Per husband, patient has completed both Covid vaccinations x2 and had received booster last week as well.   Subjective:   Jordan Horne was seen and examined today.  He is more awake and appropriate today, she complains of chronic back pain.    Assessment & Plan      Multiple fractures of ribs, right side, initial encounter for closed fracture -Secondary to mechanical fall at home, fractures of right eighth, ninth ribs with intractable pain -Lumbar spine x-ray negative for any fractures -PT evaluation recommended SNF versus home health PT with 24/7 support.  Family requested SNF, social work consult placed -She is on scheduled Tylenol, Lidoderm patch, I and as needed p.o. oxycodone .  Acute metabolic encephalopathy - it does appear she did start to develop some dementia over last few months. -B12 on the lower side,  will start on supplement. -She has an elevated, will increase her levothyroxine from 75 mcg to 100 mcg. -She has UTI as well will treat.  -CT head with no acute findings -He is at hospital delirium as well, started on low-dose Seroquel evening time.  -Continue with IV fluids given poor oral intake -Patient has significantly improved today, she is awake alert x2, answering all questions.  Hypkalemia/hyponatremia -Continue with IV fluids  COVID-19 positive -Incidental COVID-19 positive on admission. Has pleuritic chest pain from the fractures of right eighth ninth ribs.  Had vaccination x2, booster last week.   -Continue pain control incentive spirometry, O2 sats 98% on room air -Chest x-ray clear on 12/14 -Per pharmacy, does not meet criteria for Mab infusion due to partially or fully vaccinated for COVID-19, asymptomatic with a cycle time> 32 -Currently stable, O2 sats 99% on room air, asymptomatic -D-dimers is elevated, but trending down, Doppler negative for DVT.   - will need isolation for 10 days .  Tomorrow can discontinue her isolation.  Hyperlipidemia Continue statin  Hypothyroidism -TSH elevated  at 14, have increased her Synthroid from 75 to 100 mcg .  Urinary retention/UTI -Required and out x3, Foley catheter inserted  -Continue with IV Rocephin, follow on urine cultures,  Code Status: Full CODE STATUS DVT Prophylaxis:  Lovenox  Family Communication: Discussed with husband by phone12/20/2021, 12/22, 12/23, 12/24   Disposition Plan:     Status is: Inpatient  The patient will require care spanning > 2 midnights and Dispo: The patient is from: Home              Anticipated d/c is to: SNF              Anticipated d/c date is: 2 days              Patient currently is not medically stable to d/c.        Procedures:  None Consultants:   None  Antimicrobials:   Anti-infectives (From admission, onward)   Start     Dose/Rate Route Frequency Ordered Stop    03/16/20 0800  cefTRIAXone (ROCEPHIN) 1 g in sodium chloride 0.9 % 100 mL IVPB        1 g 200 mL/hr over 30 Minutes Intravenous Every 24 hours 03/16/20 0713           Medications  Scheduled Meds: . (feeding supplement) PROSource Plus  30 mL Oral BID BM  . acetaminophen  650 mg Oral TID  . aspirin EC  81 mg Oral Daily  . bisacodyl  10 mg Rectal Once  . Chlorhexidine Gluconate Cloth  6 each Topical Daily  . cyanocobalamin  1,000 mcg Intramuscular Daily   Followed by  . [START ON 03/19/2020] cyanocobalamin  1,000 mcg Intramuscular Weekly  . enoxaparin (LOVENOX) injection  40 mg Subcutaneous Q1200  . feeding supplement  237 mL Oral TID BM  . levothyroxine  100 mcg Oral Daily  . lidocaine  1 patch Transdermal Q24H  . pantoprazole  40 mg Oral BID AC  . QUEtiapine  25 mg Oral QHS  . rosuvastatin  10 mg Oral Daily  . senna-docusate  2 tablet Oral BID   Continuous Infusions: . cefTRIAXone (ROCEPHIN)  IV 1 g (03/17/20 0859)   PRN Meds:.acetaminophen **OR** acetaminophen, oxyCODONE       Objective:   Vitals:   03/16/20 0516 03/16/20 1251 03/16/20 2046 03/17/20 0639  BP: 134/71 138/76 (!) 148/56 (!) 153/94  Pulse: 76 83 85 99  Resp: Temp: 99 F (37.2 C) 99 F (37.2 C) 98.7 F (37.1 C) 98.8 F (37.1 C)  TempSrc: Oral Oral Axillary Axillary  SpO2: 95% 95% 97% 96%  Weight:      Height:        Intake/Output Summary (Last 24 hours) at 03/17/2020 1306 Last data filed at 03/17/2020 0949 Gross per 24 hour  Intake 540 ml  Output 750 ml  Net -210 ml     Wt Readings from Last 3 Encounters:  03/10/20 84.4 kg  12/02/19 72.2 kg  09/09/19 73.5 kg   Physical Exam  Awake Alert, Oriented X 2, patient has significantly improved, she is more awake and appropriate today, follows some commands, answering questions, she remains confused Symmetrical Chest wall movement, Good air movement bilaterally, CTAB RRR,No Gallops,Rubs or new Murmurs, No Parasternal  Heave +ve B.Sounds, Abd Soft, No tenderness, No rebound - guarding or rigidity. No Cyanosis, Clubbing or edema, No new Rash or bruise     Data Reviewed:  I have personally reviewed following  labs and imaging studies  Micro Results Recent Results (from the past 240 hour(s))  Resp Panel by RT-PCR (Flu A&B, Covid) Nasopharyngeal Swab     Status: Abnormal   Collection Time: 03/07/20 11:08 PM   Specimen: Nasopharyngeal Swab; Nasopharyngeal(NP) swabs in vial transport medium  Result Value Ref Range Status   SARS Coronavirus 2 by RT PCR POSITIVE (A) NEGATIVE Final    Comment: RESULT CALLED TO, READ BACK BY AND VERIFIED WITH: Laurice Record, A RN 03/08/20 at 1224 sk (NOTE) SARS-CoV-2 target nucleic acids are DETECTED.  The SARS-CoV-2 RNA is generally detectable in upper respiratory specimens during the acute phase of infection. Positive results are indicative of the presence of the identified virus, but do not rule out bacterial infection or co-infection with other pathogens not detected by the test. Clinical correlation with patient history and other diagnostic information is necessary to determine patient infection status. The expected result is Negative.  Fact Sheet for Patients: BloggerCourse.com  Fact Sheet for Healthcare Providers: SeriousBroker.it  This test is not yet approved or cleared by the [place] FDA and  has been authorized for detection and/or diagnosis of SARS-CoV-2 by FDA under an Emergency Use Authorization (EUA).  This EUA will remain in effect (meaning this test can be  used) for the duration of  the COVID-19 declaration under Section 564(b)(1) of the Act, 21 U.S.C. section 360bbb-3(b)(1), unless the authorization is terminated or revoked sooner.     Influenza A by PCR NEGATIVE NEGATIVE Final   Influenza B by PCR NEGATIVE NEGATIVE Final    Comment: (NOTE) The Xpert Xpress SARS-CoV-2/FLU/RSV plus assay is  intended as an aid in the diagnosis of influenza from Nasopharyngeal swab specimens and should not be used as a sole basis for treatment. Nasal washings and aspirates are unacceptable for Xpert Xpress SARS-CoV-2/FLU/RSV testing.  Fact Sheet for Patients: BloggerCourse.com  Fact Sheet for Healthcare Providers: SeriousBroker.it  This test is not yet approved or cleared by the [place] FDA and has been authorized for detection and/or diagnosis of SARS-CoV-2 by FDA under an Emergency Use Authorization (EUA). This EUA will remain in effect (meaning this test can be used) for the duration of the COVID-19 declaration under Section 564(b)(1) of the Act, 21 U.S.C. section 360bbb-3(b)(1), unless the authorization is terminated or revoked.  Performed at [facility], [address], [place], [place] [address]     Radiology Reports DG Ribs Unilateral W/Chest Right  Result Date: 03/07/2020 CLINICAL DATA:  Fall, right chest pain EXAM: RIGHT RIBS AND CHEST - 3+ VIEW COMPARISON:  None. FINDINGS: Single view radiograph of the chest and two view radiograph of the right ribs demonstrates acute minimally displaced fractures of the right eighth and ninth ribs posteriorly. Minimal left basilar atelectasis or infiltrate. Lungs are otherwise clear. No pneumothorax or pleural effusion. Cardiac size is at the upper limits of normal. Pulmonary vascularity is normal. IMPRESSION: Acute minimally displaced right 8, 9 rib fractures. No pneumothorax. Electronically Signed   By: Helyn Numbers MD   On: 03/07/2020 20:05   DG Thoracic Spine 2 View  Result Date: 03/07/2020 CLINICAL DATA:  Fall, back pain EXAM: THORACIC SPINE 2 VIEWS COMPARISON:  04/24/2018 FINDINGS: Two view radiograph thoracic spine demonstrates normal thoracic kyphosis. Mild thoracic dextroscoliosis, apex right at T5, unchanged. No acute fracture or listhesis of the thoracic spine.  Vertebral body height has been preserved. There is diffuse intervertebral disc space narrowing and endplate remodeling throughout the thoracic spine in keeping with changes of  diffuse severe degenerative disc disease. The paraspinal soft tissues are unremarkable. Incidental note is made of a small hiatal hernia. IMPRESSION: No acute fracture or listhesis. Electronically Signed   By: Helyn Numbers MD   On: 03/07/2020 20:02   DG Lumbar Spine Complete  Result Date: 03/07/2020 CLINICAL DATA:  Fall, back pain EXAM: LUMBAR SPINE - COMPLETE 4+ VIEW COMPARISON:  None. FINDINGS: Five view radiograph lumbar spine. Normal lumbar lordosis. No acute fracture or listhesis of the lumbar spine. Vertebral body height has been preserved. There is diffuse severe intervertebral disc space narrowing and endplate remodeling with multilevel vacuum disc phenomena in keeping with changes of diffuse severe degenerative disc disease throughout the lumbar spine. Oblique views demonstrate no evidence of pars defect. Paraspinal soft tissues are unremarkable. IMPRESSION: No acute fracture or listhesis. Electronically Signed   By: Helyn Numbers MD   On: 03/07/2020 20:06   DG Abd 1 View  Result Date: 03/15/2020 CLINICAL DATA:  Nausea and vomiting EXAM: ABDOMEN - 1 VIEW COMPARISON:  08/13/2018 FINDINGS: Bowel gas pattern is unremarkable. Cholecystectomy clips. No acute osseous abnormality. IMPRESSION: Normal bowel gas pattern. Electronically Signed   By: Guadlupe Spanish M.D.   On: 03/15/2020 10:41   CT HEAD WO CONTRAST  Result Date: 03/15/2020 CLINICAL DATA:  Delirium EXAM: CT HEAD WITHOUT CONTRAST TECHNIQUE: Contiguous axial images were obtained from the base of the skull through the vertex without intravenous contrast. COMPARISON:  03/07/2020 FINDINGS: Clinical note: Examination is mildly degraded by patient motion. Brain: No evidence of acute infarction, hemorrhage, hydrocephalus, extra-axial collection or mass lesion/mass effect.  Mild low-density changes within the periventricular and subcortical white matter compatible with chronic microvascular ischemic change. Mild diffuse cerebral volume loss. Vascular: Atherosclerotic calcifications involving the large vessels of the skull base. No unexpected hyperdense vessel. Skull: No evidence of calvarial fracture. Sinuses/Orbits: No acute finding. Other: None. IMPRESSION: 1. No acute intracranial findings. 2. Chronic microvascular ischemic change and cerebral volume loss. Electronically Signed   By: Duanne Guess D.O.   On: 03/15/2020 16:50   CT Head Wo Contrast  Result Date: 03/07/2020 CLINICAL DATA:  Head trauma EXAM: CT HEAD WITHOUT CONTRAST TECHNIQUE: Contiguous axial images were obtained from the base of the skull through the vertex without intravenous contrast. COMPARISON:  CT brain 08/12/2019 FINDINGS: Brain: No acute territorial infarction, hemorrhage, or intracranial mass. Mild atrophy. Mild chronic small vessel ischemic changes of the white matter. Chronic appearing lacunar infarcts in the basal ganglia. Stable punctate calcifications in the left periventricular white matter. Stable ventricle size. Vascular: No hyperdense vessels.  Carotid vascular calcification Skull: Normal. Negative for fracture or focal lesion. Sinuses/Orbits: Mild mucosal thickening in the sinuses Other: None IMPRESSION: 1. No CT evidence for acute intracranial abnormality. 2. Atrophy and mild chronic small vessel ischemic changes of the white matter. Electronically Signed   By: Jasmine Pang M.D.   On: 03/07/2020 20:21   CT Cervical Spine Wo Contrast  Result Date: 03/07/2020 CLINICAL DATA:  Trauma EXAM: CT CERVICAL SPINE WITHOUT CONTRAST TECHNIQUE: Multidetector CT imaging of the cervical spine was performed without intravenous contrast. Multiplanar CT image reconstructions were also generated. COMPARISON:  08/12/2019 FINDINGS: Alignment: Stable alignment with trace retrolisthesis C5 on C6 and trace  anterolisthesis C6 on C7. Facet alignment is maintained. Skull base and vertebrae: No acute fracture. No primary bone lesion or focal pathologic process. Soft tissues and spinal canal: No prevertebral fluid or swelling. No visible canal hematoma. Disc levels: Multiple level degenerative changes with moderate to marked  disease at C4-C5 and C5-C6. Facet degenerative change at multiple levels. Upper chest: Negative.  No appreciable thyroid tissue identified. Other: None IMPRESSION: Stable alignment of the cervical spine with degenerative changes. No acute osseous abnormality. Electronically Signed   By: Jasmine Pang M.D.   On: 03/07/2020 20:26   VAS [place] LOWER EXTREMITY VENOUS (DVT)  Result Date: 03/13/2020  Lower Venous DVT Study Other Indications: Covid, Elevated D-Dimer. Comparison Study: No previous exam Performing Technologist: Clint Guy RVT  Examination Guidelines: A complete evaluation includes B-mode imaging, spectral Doppler, color Doppler, and power Doppler as needed of all accessible portions of each vessel. Bilateral testing is considered an integral part of a complete examination. Limited examinations for reoccurring indications may be performed as noted. The reflux portion of the exam is performed with the patient in reverse Trendelenburg.  +---------+---------------+---------+-----------+----------+--------------+ RIGHT    CompressibilityPhasicitySpontaneityPropertiesThrombus Aging +---------+---------------+---------+-----------+----------+--------------+ CFV      Full           Yes      Yes                                 +---------+---------------+---------+-----------+----------+--------------+ SFJ      Full                                                        +---------+---------------+---------+-----------+----------+--------------+ FV Prox  Full                                                         +---------+---------------+---------+-----------+----------+--------------+ FV Mid   Full                                                        +---------+---------------+---------+-----------+----------+--------------+ FV DistalFull                                                        +---------+---------------+---------+-----------+----------+--------------+ PFV      Full                                                        +---------+---------------+---------+-----------+----------+--------------+ POP      Full           Yes      Yes                                 +---------+---------------+---------+-----------+----------+--------------+ PTV      Full                                                        +---------+---------------+---------+-----------+----------+--------------+  PERO     Full                                                        +---------+---------------+---------+-----------+----------+--------------+   +---------+---------------+---------+-----------+----------+--------------+ LEFT     CompressibilityPhasicitySpontaneityPropertiesThrombus Aging +---------+---------------+---------+-----------+----------+--------------+ CFV      Full           Yes      Yes                                 +---------+---------------+---------+-----------+----------+--------------+ SFJ      Full                                                        +---------+---------------+---------+-----------+----------+--------------+ FV Prox  Full                                                        +---------+---------------+---------+-----------+----------+--------------+ FV Mid   Full                                                        +---------+---------------+---------+-----------+----------+--------------+ FV DistalFull                                                         +---------+---------------+---------+-----------+----------+--------------+ PFV      Full                                                        +---------+---------------+---------+-----------+----------+--------------+ POP      Full           Yes      Yes                                 +---------+---------------+---------+-----------+----------+--------------+ PTV      Full                                                        +---------+---------------+---------+-----------+----------+--------------+ PERO     Full                                                        +---------+---------------+---------+-----------+----------+--------------+  Summary: RIGHT: - There is no evidence of deep vein thrombosis in the lower extremity.  - No cystic structure found in the popliteal fossa.  LEFT: - There is no evidence of deep vein thrombosis in the lower extremity.  - No cystic structure found in the popliteal fossa.  *See table(s) above for measurements and observations. Electronically signed by Waverly Ferrari MD on 03/13/2020 at 4:35:46 PM.    Final     Lab Data:  CBC: Recent Labs  Lab 03/14/20 0801 03/15/20 1009 03/16/20 0106 03/16/20 0428 03/16/20 0806 03/17/20 0042  WBC 11.7* 12.1* 8.2  --  9.5 8.8  HGB 11.4* 11.1* 8.7* 9.5* 10.0* 10.4*  HCT 34.3* 32.3* 25.8* 27.2* 29.9* 31.0*  MCV 94.2 92.8 94.9  --  94.9 95.7  PLT 123* 201 165  --  199 196   Basic Metabolic Panel: Recent Labs  Lab 03/13/20 0419 03/15/20 1009 03/16/20 0106 03/16/20 0806 03/17/20 0042  NA 133* 130* 133* 135 138  K 3.9 3.2* 3.1* 4.5 4.2  CL 99 95* 102 103 108  CO2 24 24 23 23  21*  GLUCOSE 88 124* 113* 93 86  BUN 20 19 22 21 14   CREATININE 0.91 0.71 1.13* 0.95 0.66  CALCIUM 9.1 8.6* 8.0* 8.2* 8.1*   GFR: Estimated Creatinine Clearance: 52.9 mL/min (by C-G formula based on SCr of 0.66 mg/dL). Liver Function Tests: Recent Labs  Lab 03/12/20 0421 03/15/20 1009 03/16/20 0106  03/16/20 0806 03/17/20 0042  AST 24 36 24 33 36  ALT 20 28 23 28 29   ALKPHOS 59 70 49 61 62  BILITOT 1.3* 1.1 0.4 0.8 1.1  PROT 6.3* 6.3* 4.9* 5.4* 5.6*  ALBUMIN 3.3* 3.4* 2.5* 2.9* 2.9*   Recent Labs  Lab 03/15/20 1009  LIPASE 21   No results for input(s): AMMONIA in the last 168 hours. Coagulation Profile: No results for input(s): INR, PROTIME in the last 168 hours. Cardiac Enzymes: No results for input(s): CKTOTAL, CKMB, CKMBINDEX, TROPONINI in the last 168 hours. BNP (last 3 results) No results for input(s): PROBNP in the last 8760 hours. HbA1C: No results for input(s): HGBA1C in the last 72 hours. CBG: No results for input(s): GLUCAP in the last 168 hours. Lipid Profile: No results for input(s): CHOL, HDL, LDLCALC, TRIG, CHOLHDL, LDLDIRECT in the last 72 hours. Thyroid Function Tests: Recent Labs    03/16/20 0106  TSH 14.556*   Anemia Panel: Recent Labs    03/16/20 0106  VITAMINB12 276   Urine analysis:    Component Value Date/Time   COLORURINE AMBER (A) 03/15/2020 1722   APPEARANCEUR HAZY (A) 03/15/2020 1722   LABSPEC 1.023 03/15/2020 1722   PHURINE 5.0 03/15/2020 1722   GLUCOSEU NEGATIVE 03/15/2020 1722   HGBUR SMALL (A) 03/15/2020 1722   BILIRUBINUR NEGATIVE 03/15/2020 1722   KETONESUR 5 (A) 03/15/2020 1722   PROTEINUR 30 (A) 03/15/2020 1722   UROBILINOGEN 0.2 12/09/2009 0006   NITRITE NEGATIVE 03/15/2020 1722   LEUKOCYTESUR TRACE (A) 03/15/2020 1722     Abbe Bula M.D. [facility] 03/17/2020, 1:06 PM   Call night coverage person covering after 7pm

## 2020-03-18 LAB — COMPREHENSIVE METABOLIC PANEL
ALT: 26 U/L (ref 0–44)
AST: 21 U/L (ref 15–41)
Albumin: 2.8 g/dL — ABNORMAL LOW (ref 3.5–5.0)
Alkaline Phosphatase: 68 U/L (ref 38–126)
Anion gap: 7 (ref 5–15)
BUN: 16 mg/dL (ref 8–23)
CO2: 25 mmol/L (ref 22–32)
Calcium: 8.4 mg/dL — ABNORMAL LOW (ref 8.9–10.3)
Chloride: 106 mmol/L (ref 98–111)
Creatinine, Ser: 0.7 mg/dL (ref 0.44–1.00)
GFR, Estimated: >60 mL/min (ref >60)
Glucose, Bld: 108 mg/dL — ABNORMAL HIGH (ref 70–99)
Potassium: 3.3 mmol/L — ABNORMAL LOW (ref 3.5–5.1)
Sodium: 138 mmol/L (ref 135–145)
Total Bilirubin: 0.7 mg/dL (ref 0.3–1.2)
Total Protein: 5.4 g/dL — ABNORMAL LOW (ref 6.5–8.1)

## 2020-03-18 LAB — CBC
HCT: 31.3 % — ABNORMAL LOW (ref 36.0–46.0)
Hemoglobin: 10.3 g/dL — ABNORMAL LOW (ref 12.0–15.0)
MCH: 31.8 pg (ref 26.0–34.0)
MCHC: 32.9 g/dL (ref 30.0–36.0)
MCV: 96.6 fL (ref 80.0–100.0)
Platelets: 265 10*3/uL (ref 150–400)
RBC: 3.24 MIL/uL — ABNORMAL LOW (ref 3.87–5.11)
RDW: 14.3 % (ref 11.5–15.5)
WBC: 9 10*3/uL (ref 4.0–10.5)
nRBC: 0 % (ref 0.0–0.2)

## 2020-03-18 MED ORDER — QUETIAPINE FUMARATE 50 MG PO TABS
50.0000 mg | ORAL_TABLET | Freq: Every day | ORAL | Status: DC
  Administered 2020-03-18 – 2020-03-29 (×12): 50 mg via ORAL
  Filled 2020-03-18 (×12): qty 1

## 2020-03-18 MED ORDER — POTASSIUM CHLORIDE 20 MEQ PO PACK
40.0000 meq | PACK | Freq: Two times a day (BID) | ORAL | Status: AC
  Administered 2020-03-18 (×2): 40 meq via ORAL
  Filled 2020-03-18 (×2): qty 2

## 2020-03-18 NOTE — Progress Notes (Signed)
[facility]                                                                              Patient Demographics  Jordan Horne, is a 86 y.o. female, DOB - 1933/02/26, ZOX:[id number]  Admit date - 03/07/2020   Admitting Physician Ripudeep Jenna LuoK Rai, MD  Outpatient Primary MD for the patient is Dema SeverinYork, Regina F, NP  Outpatient specialists:   LOS - 10  days   Medical records reviewed and are as summarized below:  Brief summary   Jordan Horne is a 86 y.o. female with medical history significant for mild dementia, HLD, hypothyroidism who presents after a fall at home. She was walking at home today when she fell down 3 steps at about 4:30 PM. Patient states she just tripped and fell. Patient states she hit the back of her head and landed on her right side. She is having moderate to severe pain in the back of her head and neck. Patient states she is having pain also on the right side of her ribs. She did not take anything for the pain at home.She denies any shortness of breath. No abdominal pain. She did not have LOC. No seizure activity reported.  Per husband, patient has completed both Covid vaccinations x2 and had received booster last week as well.   Subjective:   Jordan Horne was seen and examined today.  She is more awake, and appropriate, appetite has improved as well.   Assessment & Plan      Multiple fractures of ribs, right side, initial encounter for closed fracture -Secondary to mechanical fall at home, fractures of right eighth, ninth ribs with intractable pain -Lumbar spine x-ray negative for any fractures -PT evaluation recommended SNF versus home health PT with 24/7 support.  Family requested SNF, social work consult placed -She is on scheduled Tylenol, Lidoderm patch, I and as needed p.o. oxycodone .  Acute metabolic encephalopathy - it does appear she did start to develop some dementia over last few months. -B12 on the lower side, will start  on supplement. -She has an elevated, will increase her levothyroxine from 75 mcg to 100 mcg. -She has UTI as well will treat.  -CT head with no acute findings -He is at hospital delirium as well, started on low-dose Seroquel evening time.  -Continue with IV fluids given poor oral intake -Patient has significantly improved today, she is awake alert x2, answering all questions.  Hypkalemia/hyponatremia -Continue with IV fluids  COVID-19 positive -Incidental COVID-19 positive on admission. Has pleuritic chest pain from the fractures of right eighth ninth ribs.  Had vaccination x2, booster last week.   -Continue pain control incentive spirometry, O2 sats 98% on room air -Chest x-ray clear on 12/14 -Per pharmacy, does not meet criteria for Mab infusion due to partially or fully vaccinated for COVID-19, asymptomatic with a cycle time> 32 -Currently stable, O2 sats 99% on room air, asymptomatic -D-dimers is elevated, but trending down, Doppler negative for DVT.   -She is for isolation 10 days, this has expired 12/25 .  Hyperlipidemia Continue statin  Hypokalemia -Potassium of 3.3 this morning, will replete.  Hypothyroidism -TSH elevated at 14, have increased her Synthroid from 75 to 100 mcg .  We will need to recheck TSH level in 4 to 6 weeks.  Urinary retention/UTI -Required and out x3, Foley catheter inserted  -Continue with IV Rocephin, follow on urine cultures,  Code Status: Full CODE STATUS DVT Prophylaxis:  Lovenox  Family Communication: Discussed with husband by phone12/20/2021, 12/22, 12/23, 12/24, 12/25.   Disposition Plan:     Status is: Inpatient  The patient will require care spanning > 2 midnights and Dispo: The patient is from: Home              Anticipated d/c is to: SNF              Anticipated d/c date is: 2 days              Patient currently is not medically stable to d/c.        Procedures:  None Consultants:   None  Antimicrobials:    Anti-infectives (From admission, onward)   Start     Dose/Rate Route Frequency Ordered Stop   03/16/20 0800  cefTRIAXone (ROCEPHIN) 1 g in sodium chloride 0.9 % 100 mL IVPB        1 g 200 mL/hr over 30 Minutes Intravenous Every 24 hours 03/16/20 0713           Medications  Scheduled Meds: . (feeding supplement) PROSource Plus  30 mL Oral BID BM  . acetaminophen  650 mg Oral TID  . aspirin EC  81 mg Oral Daily  . bisacodyl  10 mg Rectal Once  . Chlorhexidine Gluconate Cloth  6 each Topical Daily  . [START ON 03/19/2020] cyanocobalamin  1,000 mcg Intramuscular Weekly  . enoxaparin (LOVENOX) injection  40 mg Subcutaneous Q1200  . feeding supplement  237 mL Oral TID BM  . levothyroxine  100 mcg Oral Daily  . lidocaine  1 patch Transdermal Q24H  . pantoprazole  40 mg Oral BID AC  . potassium chloride  40 mEq Oral BID  . QUEtiapine  25 mg Oral QHS  . rosuvastatin  10 mg Oral Daily  . senna-docusate  2 tablet Oral BID   Continuous Infusions: . cefTRIAXone (ROCEPHIN)  IV 1 g (03/18/20 0844)   PRN Meds:.acetaminophen **OR** acetaminophen, oxyCODONE       Objective:   Vitals:   03/17/20 0639 03/17/20 1300 03/17/20 2008 03/18/20 0442  BP: (!) 153/94 117/90 127/70 134/81  Pulse: 99 88 88 93  Resp: Temp: 98.8 F (37.1 C) 98.6 F (37 C) 98.5 F (36.9 C) 97.9 F (36.6 C)  TempSrc: Axillary Oral Oral Oral  SpO2: 96% 90% 95% 97%  Weight:      Height:        Intake/Output Summary (Last 24 hours) at 03/18/2020 1249 Last data filed at 03/18/2020 1231 Gross per 24 hour  Intake 700 ml  Output 1750 ml  Net -1050 ml     Wt Readings from Last 3 Encounters:  03/10/20 84.4 kg  12/02/19 72.2 kg  09/09/19 73.5 kg   Physical Exam  Awake Alert,, pleasant, confused and easily distracted, but she is more appropriate conversant and pleasant follow commands and answering simple. Symmetrical Chest wall movement, Good air movement bilaterally, CTAB RRR,No  Gallops,Rubs or new Murmurs, No Parasternal Heave +ve B.Sounds, Abd Soft, No tenderness, No rebound - guarding or rigidity. No Cyanosis, Clubbing or edema, No new Rash or bruise  Data Reviewed:  I have personally reviewed following labs and imaging studies  Micro Results No results found for this or any previous visit (from the past 240 hour(s)).  Radiology Reports DG Ribs Unilateral W/Chest Right  Result Date: 03/07/2020 CLINICAL DATA:  Fall, right chest pain EXAM: RIGHT RIBS AND CHEST - 3+ VIEW COMPARISON:  None. FINDINGS: Single view radiograph of the chest and two view radiograph of the right ribs demonstrates acute minimally displaced fractures of the right eighth and ninth ribs posteriorly. Minimal left basilar atelectasis or infiltrate. Lungs are otherwise clear. No pneumothorax or pleural effusion. Cardiac size is at the upper limits of normal. Pulmonary vascularity is normal. IMPRESSION: Acute minimally displaced right 8, 9 rib fractures. No pneumothorax. Electronically Signed   By: Helyn Numbers MD   On: 03/07/2020 20:05   DG Thoracic Spine 2 View  Result Date: 03/07/2020 CLINICAL DATA:  Fall, back pain EXAM: THORACIC SPINE 2 VIEWS COMPARISON:  04/24/2018 FINDINGS: Two view radiograph thoracic spine demonstrates normal thoracic kyphosis. Mild thoracic dextroscoliosis, apex right at T5, unchanged. No acute fracture or listhesis of the thoracic spine. Vertebral body height has been preserved. There is diffuse intervertebral disc space narrowing and endplate remodeling throughout the thoracic spine in keeping with changes of diffuse severe degenerative disc disease. The paraspinal soft tissues are unremarkable. Incidental note is made of a small hiatal hernia. IMPRESSION: No acute fracture or listhesis. Electronically Signed   By: Helyn Numbers MD   On: 03/07/2020 20:02   DG Lumbar Spine Complete  Result Date: 03/07/2020 CLINICAL DATA:  Fall, back pain EXAM: LUMBAR SPINE -  COMPLETE 4+ VIEW COMPARISON:  None. FINDINGS: Five view radiograph lumbar spine. Normal lumbar lordosis. No acute fracture or listhesis of the lumbar spine. Vertebral body height has been preserved. There is diffuse severe intervertebral disc space narrowing and endplate remodeling with multilevel vacuum disc phenomena in keeping with changes of diffuse severe degenerative disc disease throughout the lumbar spine. Oblique views demonstrate no evidence of pars defect. Paraspinal soft tissues are unremarkable. IMPRESSION: No acute fracture or listhesis. Electronically Signed   By: Helyn Numbers MD   On: 03/07/2020 20:06   DG Abd 1 View  Result Date: 03/15/2020 CLINICAL DATA:  Nausea and vomiting EXAM: ABDOMEN - 1 VIEW COMPARISON:  08/13/2018 FINDINGS: Bowel gas pattern is unremarkable. Cholecystectomy clips. No acute osseous abnormality. IMPRESSION: Normal bowel gas pattern. Electronically Signed   By: Guadlupe Spanish M.D.   On: 03/15/2020 10:41   CT HEAD WO CONTRAST  Result Date: 03/15/2020 CLINICAL DATA:  Delirium EXAM: CT HEAD WITHOUT CONTRAST TECHNIQUE: Contiguous axial images were obtained from the base of the skull through the vertex without intravenous contrast. COMPARISON:  03/07/2020 FINDINGS: Clinical note: Examination is mildly degraded by patient motion. Brain: No evidence of acute infarction, hemorrhage, hydrocephalus, extra-axial collection or mass lesion/mass effect. Mild low-density changes within the periventricular and subcortical white matter compatible with chronic microvascular ischemic change. Mild diffuse cerebral volume loss. Vascular: Atherosclerotic calcifications involving the large vessels of the skull base. No unexpected hyperdense vessel. Skull: No evidence of calvarial fracture. Sinuses/Orbits: No acute finding. Other: None. IMPRESSION: 1. No acute intracranial findings. 2. Chronic microvascular ischemic change and cerebral volume loss. Electronically Signed   By: Duanne Guess D.O.   On: 03/15/2020 16:50   CT Head Wo Contrast  Result Date: 03/07/2020 CLINICAL DATA:  Head trauma EXAM: CT HEAD WITHOUT CONTRAST TECHNIQUE: Contiguous axial images were obtained from the base of the skull  through the vertex without intravenous contrast. COMPARISON:  CT brain 08/12/2019 FINDINGS: Brain: No acute territorial infarction, hemorrhage, or intracranial mass. Mild atrophy. Mild chronic small vessel ischemic changes of the white matter. Chronic appearing lacunar infarcts in the basal ganglia. Stable punctate calcifications in the left periventricular white matter. Stable ventricle size. Vascular: No hyperdense vessels.  Carotid vascular calcification Skull: Normal. Negative for fracture or focal lesion. Sinuses/Orbits: Mild mucosal thickening in the sinuses Other: None IMPRESSION: 1. No CT evidence for acute intracranial abnormality. 2. Atrophy and mild chronic small vessel ischemic changes of the white matter. Electronically Signed   By: Jasmine Pang M.D.   On: 03/07/2020 20:21   CT Cervical Spine Wo Contrast  Result Date: 03/07/2020 CLINICAL DATA:  Trauma EXAM: CT CERVICAL SPINE WITHOUT CONTRAST TECHNIQUE: Multidetector CT imaging of the cervical spine was performed without intravenous contrast. Multiplanar CT image reconstructions were also generated. COMPARISON:  08/12/2019 FINDINGS: Alignment: Stable alignment with trace retrolisthesis C5 on C6 and trace anterolisthesis C6 on C7. Facet alignment is maintained. Skull base and vertebrae: No acute fracture. No primary bone lesion or focal pathologic process. Soft tissues and spinal canal: No prevertebral fluid or swelling. No visible canal hematoma. Disc levels: Multiple level degenerative changes with moderate to marked disease at C4-C5 and C5-C6. Facet degenerative change at multiple levels. Upper chest: Negative.  No appreciable thyroid tissue identified. Other: None IMPRESSION: Stable alignment of the cervical spine with  degenerative changes. No acute osseous abnormality. Electronically Signed   By: Jasmine Pang M.D.   On: 03/07/2020 20:26   VAS [place] LOWER EXTREMITY VENOUS (DVT)  Result Date: 03/13/2020  Lower Venous DVT Study Other Indications: Covid, Elevated D-Dimer. Comparison Study: No previous exam Performing Technologist: Clint Guy RVT  Examination Guidelines: A complete evaluation includes B-mode imaging, spectral Doppler, color Doppler, and power Doppler as needed of all accessible portions of each vessel. Bilateral testing is considered an integral part of a complete examination. Limited examinations for reoccurring indications may be performed as noted. The reflux portion of the exam is performed with the patient in reverse Trendelenburg.  +---------+---------------+---------+-----------+----------+--------------+ RIGHT    CompressibilityPhasicitySpontaneityPropertiesThrombus Aging +---------+---------------+---------+-----------+----------+--------------+ CFV      Full           Yes      Yes                                 +---------+---------------+---------+-----------+----------+--------------+ SFJ      Full                                                        +---------+---------------+---------+-----------+----------+--------------+ FV Prox  Full                                                        +---------+---------------+---------+-----------+----------+--------------+ FV Mid   Full                                                        +---------+---------------+---------+-----------+----------+--------------+  FV DistalFull                                                        +---------+---------------+---------+-----------+----------+--------------+ PFV      Full                                                        +---------+---------------+---------+-----------+----------+--------------+ POP      Full           Yes      Yes                                  +---------+---------------+---------+-----------+----------+--------------+ PTV      Full                                                        +---------+---------------+---------+-----------+----------+--------------+ PERO     Full                                                        +---------+---------------+---------+-----------+----------+--------------+   +---------+---------------+---------+-----------+----------+--------------+ LEFT     CompressibilityPhasicitySpontaneityPropertiesThrombus Aging +---------+---------------+---------+-----------+----------+--------------+ CFV      Full           Yes      Yes                                 +---------+---------------+---------+-----------+----------+--------------+ SFJ      Full                                                        +---------+---------------+---------+-----------+----------+--------------+ FV Prox  Full                                                        +---------+---------------+---------+-----------+----------+--------------+ FV Mid   Full                                                        +---------+---------------+---------+-----------+----------+--------------+ FV DistalFull                                                        +---------+---------------+---------+-----------+----------+--------------+  PFV      Full                                                        +---------+---------------+---------+-----------+----------+--------------+ POP      Full           Yes      Yes                                 +---------+---------------+---------+-----------+----------+--------------+ PTV      Full                                                        +---------+---------------+---------+-----------+----------+--------------+ PERO     Full                                                         +---------+---------------+---------+-----------+----------+--------------+     Summary: RIGHT: - There is no evidence of deep vein thrombosis in the lower extremity.  - No cystic structure found in the popliteal fossa.  LEFT: - There is no evidence of deep vein thrombosis in the lower extremity.  - No cystic structure found in the popliteal fossa.  *See table(s) above for measurements and observations. Electronically signed by Waverly Ferrari MD on 03/13/2020 at 4:35:46 PM.    Final     Lab Data:  CBC: Recent Labs  Lab 03/15/20 1009 03/16/20 0106 03/16/20 0428 03/16/20 0806 03/17/20 0042 03/18/20 0141  WBC 12.1* 8.2  --  9.5 8.8 9.0  HGB 11.1* 8.7* 9.5* 10.0* 10.4* 10.3*  HCT 32.3* 25.8* 27.2* 29.9* 31.0* 31.3*  MCV 92.8 94.9  --  94.9 95.7 96.6  PLT 201 165  --  199 196 265   Basic Metabolic Panel: Recent Labs  Lab 03/15/20 1009 03/16/20 0106 03/16/20 0806 03/17/20 0042 03/18/20 0141  NA 130* 133* 135 138 138  K 3.2* 3.1* 4.5 4.2 3.3*  CL 95* 102 103 108 106  CO2 24 23 23  21* 25  GLUCOSE 124* 113* 93 86 108*  BUN 19 22 21 14 16   CREATININE 0.71 1.13* 0.95 0.66 0.70  CALCIUM 8.6* 8.0* 8.2* 8.1* 8.4*   GFR: Estimated Creatinine Clearance: 52.9 mL/min (by C-G formula based on SCr of 0.7 mg/dL). Liver Function Tests: Recent Labs  Lab 03/15/20 1009 03/16/20 0106 03/16/20 0806 03/17/20 0042 03/18/20 0141  AST 36 24 33 36 21  ALT 28 23 28 29 26   ALKPHOS 70 49 61 62 68  BILITOT 1.1 0.4 0.8 1.1 0.7  PROT 6.3* 4.9* 5.4* 5.6* 5.4*  ALBUMIN 3.4* 2.5* 2.9* 2.9* 2.8*   Recent Labs  Lab 03/15/20 1009  LIPASE 21   No results for input(s): AMMONIA in the last 168 hours. Coagulation Profile: No results for input(s): INR, PROTIME in the last 168 hours. Cardiac Enzymes: No results for input(s): CKTOTAL, CKMB, CKMBINDEX, TROPONINI in the last 168 hours. BNP (last 3 results) No  results for input(s): PROBNP in the last 8760 hours. HbA1C: No results for input(s):  HGBA1C in the last 72 hours. CBG: No results for input(s): GLUCAP in the last 168 hours. Lipid Profile: No results for input(s): CHOL, HDL, LDLCALC, TRIG, CHOLHDL, LDLDIRECT in the last 72 hours. Thyroid Function Tests: Recent Labs    03/16/20 0106  TSH 14.556*   Anemia Panel: Recent Labs    03/16/20 0106  VITAMINB12 276   Urine analysis:    Component Value Date/Time   COLORURINE AMBER (A) 03/15/2020 1722   APPEARANCEUR HAZY (A) 03/15/2020 1722   LABSPEC 1.023 03/15/2020 1722   PHURINE 5.0 03/15/2020 1722   GLUCOSEU NEGATIVE 03/15/2020 1722   HGBUR SMALL (A) 03/15/2020 1722   BILIRUBINUR NEGATIVE 03/15/2020 1722   KETONESUR 5 (A) 03/15/2020 1722   PROTEINUR 30 (A) 03/15/2020 1722   UROBILINOGEN 0.2 12/09/2009 0006   NITRITE NEGATIVE 03/15/2020 1722   LEUKOCYTESUR TRACE (A) 03/15/2020 1722     Quinteria Chisum M.D. [facility] 03/18/2020, 12:49 PM   Call night coverage person covering after 7pm

## 2020-03-18 NOTE — Progress Notes (Signed)
D/W charge nurse, it is appropriate for this patient to have a sitter, so order was placed, will increase OHS dose seroquel to 50 mg, and give it at 8 pm . Huey Bienenstock MD

## 2020-03-18 NOTE — Progress Notes (Signed)
MD contacted about needing a sitter. Pt has needed redirection at least 20 times since placement on this floor. Pt keeps trying to get out of the bed to feed the dog, hang up the phone, go to the store etc.... MD agreed to needing a sitter, informed MD I did not know how to place the order and if he could when he was able, MD stated he was not by a computer and refused to place order. Elgergawy, MD stated in frustration, "than the pt will not receive the order". Charge nurse informed and asked to p[lace the order.

## 2020-03-18 NOTE — Progress Notes (Signed)
Notified husband that patient moving to 6N room 18

## 2020-03-19 MED ORDER — WHITE PETROLATUM EX OINT
TOPICAL_OINTMENT | CUTANEOUS | Status: AC
  Filled 2020-03-19: qty 28.35

## 2020-03-19 NOTE — Progress Notes (Signed)
[facility]                                                                              Patient Demographics  Jordan Horne, is a 86 y.o. female, DOB - 24-Oct-1932, JGG:[id number]  Admit date - 03/07/2020   Admitting Physician Ripudeep Jenna Luo, MD  Outpatient Primary MD for the patient is Dema Severin, NP  Outpatient specialists:   LOS - 11  days   Medical records reviewed and are as summarized below:  Brief summary   Jordan Horne is a 86 y.o. female with medical history significant for mild dementia, HLD, hypothyroidism who presents after a fall at home. She was walking at home today when she fell down 3 steps at about 4:30 PM. Patient states she just tripped and fell. Patient states she hit the back of her head and landed on her right side. She is having moderate to severe pain in the back of her head and neck. Patient states she is having pain also on the right side of her ribs. She did not take anything for the pain at home.She denies any shortness of breath. No abdominal pain. She did not have LOC. No seizure activity reported.  Per husband, patient has completed both Covid vaccinations x2 and had received booster last week as well.   Subjective:   Jordan Horne was seen and examined today. She herself denies any complaints, I have discussed with staff, she had a good night sleep, she did not require any sitter yesterday or today.   Assessment & Plan      Multiple fractures of ribs, right side, initial encounter for closed fracture -Secondary to mechanical fall at home, fractures of right eighth, ninth ribs with intractable pain -Lumbar spine x-ray negative for any fractures -PT evaluation recommended SNF versus home health PT with 24/7 support.  Family requested SNF, social work consult placed -She is on scheduled Tylenol, Lidoderm patch, I and as needed p.o. oxycodone .  Acute metabolic encephalopathy - it does appear she did start to  develop some dementia over last few months. -She does appear to be with some baseline dementia, significantly confused during hospital stay, at some point lethargic, work-up difficult for abnormal TSH, low B12, and UTI,. -She was significant hospital delirium, this has improved on Seroquel, she had a good night yesterday on 50 mg of Seroquel, there is no requirement for a sitter overnight or today. -With B12 supplements  Hypkalemia/hyponatremia -Repleted  COVID-19 positive -Incidental COVID-19 positive on admission. Has pleuritic chest pain from the fractures of right eighth ninth ribs.  Had vaccination x2, booster last week.   -Continue pain control incentive spirometry, O2 sats 98% on room air -Chest x-ray clear on 12/14 -Per pharmacy, does not meet criteria for Mab infusion due to partially or fully vaccinated for COVID-19, asymptomatic with a cycle time> 32 -Currently stable, O2 sats 99% on room air, asymptomatic -D-dimers is elevated, but trending down, Doppler negative for DVT.   -She is for isolation 10 days, this has expired 12/25 .  Hyperlipidemia Continue statin  Hypothyroidism -TSH elevated at 14, have increased her Synthroid from  75 to 100 mcg .  We will need to recheck TSH level in 4 to 6 weeks.  Urinary retention/UTI -Required and out x3, Foley catheter inserted, will attempt voiding trial today 12/26. -Continue with IV Rocephin, follow on urine cultures,  Code Status: Full CODE STATUS DVT Prophylaxis:  Lovenox  Family Communication: Discussed with husband by phone12/20/2021, 12/22, 12/23, 12/24, 12/25. None at bedside today   Disposition Plan:     Status is: Inpatient  The patient will require care spanning > 2 midnights and Dispo: The patient is from: Home              Anticipated d/c is to: SNF              Anticipated d/c date is: 2 days              Patient currently is not medically stable to d/c.        Procedures:  None Consultants:    None  Antimicrobials:   Anti-infectives (From admission, onward)   Start     Dose/Rate Route Frequency Ordered Stop   03/16/20 0800  cefTRIAXone (ROCEPHIN) 1 g in sodium chloride 0.9 % 100 mL IVPB        1 g 200 mL/hr over 30 Minutes Intravenous Every 24 hours 03/16/20 0713           Medications  Scheduled Meds: . (feeding supplement) PROSource Plus  30 mL Oral BID BM  . acetaminophen  650 mg Oral TID  . aspirin EC  81 mg Oral Daily  . bisacodyl  10 mg Rectal Once  . Chlorhexidine Gluconate Cloth  6 each Topical Daily  . cyanocobalamin  1,000 mcg Intramuscular Weekly  . enoxaparin (LOVENOX) injection  40 mg Subcutaneous Q1200  . feeding supplement  237 mL Oral TID BM  . levothyroxine  100 mcg Oral Daily  . lidocaine  1 patch Transdermal Q24H  . pantoprazole  40 mg Oral BID AC  . QUEtiapine  50 mg Oral QHS  . rosuvastatin  10 mg Oral Daily  . senna-docusate  2 tablet Oral BID  . white petrolatum       Continuous Infusions: . cefTRIAXone (ROCEPHIN)  IV 1 g (03/19/20 1357)   PRN Meds:.acetaminophen **OR** acetaminophen, oxyCODONE       Objective:   Vitals:   03/18/20 1326 03/18/20 1547 03/18/20 1930 03/19/20 0622  BP: 124/61 118/60 120/62 (!) 115/52  Pulse: 86 89 90 79  Resp: 16 16 17 16   Temp: 98.1 F (36.7 C) 98.1 F (36.7 C) 98 F (36.7 C) 99.8 F (37.7 C)  TempSrc: Oral Oral Oral Axillary  SpO2: 100% 96% 97% 90%  Weight:      Height:        Intake/Output Summary (Last 24 hours) at 03/19/2020 1410 Last data filed at 03/19/2020 0900 Gross per 24 hour  Intake 220 ml  Output 600 ml  Net -380 ml     Wt Readings from Last 3 Encounters:  03/10/20 84.4 kg  12/02/19 72.2 kg  09/09/19 73.5 kg   Physical Exam  Awake, alert, confused, but pleasant, follow command and answering some questions . Symmetrical Chest wall movement, Good air movement bilaterally, CTAB RRR,No Gallops,Rubs or new Murmurs, No Parasternal Heave +ve B.Sounds, Abd Soft, No  tenderness, No rebound - guarding or rigidity. No Cyanosis, Clubbing or edema, No new Rash or bruise      Data Reviewed:  I have personally reviewed following labs and imaging  studies  Micro Results No results found for this or any previous visit (from the past 240 hour(s)).  Radiology Reports DG Ribs Unilateral W/Chest Right  Result Date: 03/07/2020 CLINICAL DATA:  Fall, right chest pain EXAM: RIGHT RIBS AND CHEST - 3+ VIEW COMPARISON:  None. FINDINGS: Single view radiograph of the chest and two view radiograph of the right ribs demonstrates acute minimally displaced fractures of the right eighth and ninth ribs posteriorly. Minimal left basilar atelectasis or infiltrate. Lungs are otherwise clear. No pneumothorax or pleural effusion. Cardiac size is at the upper limits of normal. Pulmonary vascularity is normal. IMPRESSION: Acute minimally displaced right 8, 9 rib fractures. No pneumothorax. Electronically Signed   By: Helyn Numbers MD   On: 03/07/2020 20:05   DG Thoracic Spine 2 View  Result Date: 03/07/2020 CLINICAL DATA:  Fall, back pain EXAM: THORACIC SPINE 2 VIEWS COMPARISON:  04/24/2018 FINDINGS: Two view radiograph thoracic spine demonstrates normal thoracic kyphosis. Mild thoracic dextroscoliosis, apex right at T5, unchanged. No acute fracture or listhesis of the thoracic spine. Vertebral body height has been preserved. There is diffuse intervertebral disc space narrowing and endplate remodeling throughout the thoracic spine in keeping with changes of diffuse severe degenerative disc disease. The paraspinal soft tissues are unremarkable. Incidental note is made of a small hiatal hernia. IMPRESSION: No acute fracture or listhesis. Electronically Signed   By: Helyn Numbers MD   On: 03/07/2020 20:02   DG Lumbar Spine Complete  Result Date: 03/07/2020 CLINICAL DATA:  Fall, back pain EXAM: LUMBAR SPINE - COMPLETE 4+ VIEW COMPARISON:  None. FINDINGS: Five view radiograph lumbar spine.  Normal lumbar lordosis. No acute fracture or listhesis of the lumbar spine. Vertebral body height has been preserved. There is diffuse severe intervertebral disc space narrowing and endplate remodeling with multilevel vacuum disc phenomena in keeping with changes of diffuse severe degenerative disc disease throughout the lumbar spine. Oblique views demonstrate no evidence of pars defect. Paraspinal soft tissues are unremarkable. IMPRESSION: No acute fracture or listhesis. Electronically Signed   By: Helyn Numbers MD   On: 03/07/2020 20:06   DG Abd 1 View  Result Date: 03/15/2020 CLINICAL DATA:  Nausea and vomiting EXAM: ABDOMEN - 1 VIEW COMPARISON:  08/13/2018 FINDINGS: Bowel gas pattern is unremarkable. Cholecystectomy clips. No acute osseous abnormality. IMPRESSION: Normal bowel gas pattern. Electronically Signed   By: Guadlupe Spanish M.D.   On: 03/15/2020 10:41   CT HEAD WO CONTRAST  Result Date: 03/15/2020 CLINICAL DATA:  Delirium EXAM: CT HEAD WITHOUT CONTRAST TECHNIQUE: Contiguous axial images were obtained from the base of the skull through the vertex without intravenous contrast. COMPARISON:  03/07/2020 FINDINGS: Clinical note: Examination is mildly degraded by patient motion. Brain: No evidence of acute infarction, hemorrhage, hydrocephalus, extra-axial collection or mass lesion/mass effect. Mild low-density changes within the periventricular and subcortical white matter compatible with chronic microvascular ischemic change. Mild diffuse cerebral volume loss. Vascular: Atherosclerotic calcifications involving the large vessels of the skull base. No unexpected hyperdense vessel. Skull: No evidence of calvarial fracture. Sinuses/Orbits: No acute finding. Other: None. IMPRESSION: 1. No acute intracranial findings. 2. Chronic microvascular ischemic change and cerebral volume loss. Electronically Signed   By: Duanne Guess D.O.   On: 03/15/2020 16:50   CT Head Wo Contrast  Result Date:  03/07/2020 CLINICAL DATA:  Head trauma EXAM: CT HEAD WITHOUT CONTRAST TECHNIQUE: Contiguous axial images were obtained from the base of the skull through the vertex without intravenous contrast. COMPARISON:  CT brain 08/12/2019  FINDINGS: Brain: No acute territorial infarction, hemorrhage, or intracranial mass. Mild atrophy. Mild chronic small vessel ischemic changes of the white matter. Chronic appearing lacunar infarcts in the basal ganglia. Stable punctate calcifications in the left periventricular white matter. Stable ventricle size. Vascular: No hyperdense vessels.  Carotid vascular calcification Skull: Normal. Negative for fracture or focal lesion. Sinuses/Orbits: Mild mucosal thickening in the sinuses Other: None IMPRESSION: 1. No CT evidence for acute intracranial abnormality. 2. Atrophy and mild chronic small vessel ischemic changes of the white matter. Electronically Signed   By: Jasmine Pang M.D.   On: 03/07/2020 20:21   CT Cervical Spine Wo Contrast  Result Date: 03/07/2020 CLINICAL DATA:  Trauma EXAM: CT CERVICAL SPINE WITHOUT CONTRAST TECHNIQUE: Multidetector CT imaging of the cervical spine was performed without intravenous contrast. Multiplanar CT image reconstructions were also generated. COMPARISON:  08/12/2019 FINDINGS: Alignment: Stable alignment with trace retrolisthesis C5 on C6 and trace anterolisthesis C6 on C7. Facet alignment is maintained. Skull base and vertebrae: No acute fracture. No primary bone lesion or focal pathologic process. Soft tissues and spinal canal: No prevertebral fluid or swelling. No visible canal hematoma. Disc levels: Multiple level degenerative changes with moderate to marked disease at C4-C5 and C5-C6. Facet degenerative change at multiple levels. Upper chest: Negative.  No appreciable thyroid tissue identified. Other: None IMPRESSION: Stable alignment of the cervical spine with degenerative changes. No acute osseous abnormality. Electronically Signed   By: Jasmine Pang M.D.   On: 03/07/2020 20:26   VAS [place] LOWER EXTREMITY VENOUS (DVT)  Result Date: 03/13/2020  Lower Venous DVT Study Other Indications: Covid, Elevated D-Dimer. Comparison Study: No previous exam Performing Technologist: Clint Guy RVT  Examination Guidelines: A complete evaluation includes B-mode imaging, spectral Doppler, color Doppler, and power Doppler as needed of all accessible portions of each vessel. Bilateral testing is considered an integral part of a complete examination. Limited examinations for reoccurring indications may be performed as noted. The reflux portion of the exam is performed with the patient in reverse Trendelenburg.  +---------+---------------+---------+-----------+----------+--------------+ RIGHT    CompressibilityPhasicitySpontaneityPropertiesThrombus Aging +---------+---------------+---------+-----------+----------+--------------+ CFV      Full           Yes      Yes                                 +---------+---------------+---------+-----------+----------+--------------+ SFJ      Full                                                        +---------+---------------+---------+-----------+----------+--------------+ FV Prox  Full                                                        +---------+---------------+---------+-----------+----------+--------------+ FV Mid   Full                                                        +---------+---------------+---------+-----------+----------+--------------+ FV  DistalFull                                                        +---------+---------------+---------+-----------+----------+--------------+ PFV      Full                                                        +---------+---------------+---------+-----------+----------+--------------+ POP      Full           Yes      Yes                                  +---------+---------------+---------+-----------+----------+--------------+ PTV      Full                                                        +---------+---------------+---------+-----------+----------+--------------+ PERO     Full                                                        +---------+---------------+---------+-----------+----------+--------------+   +---------+---------------+---------+-----------+----------+--------------+ LEFT     CompressibilityPhasicitySpontaneityPropertiesThrombus Aging +---------+---------------+---------+-----------+----------+--------------+ CFV      Full           Yes      Yes                                 +---------+---------------+---------+-----------+----------+--------------+ SFJ      Full                                                        +---------+---------------+---------+-----------+----------+--------------+ FV Prox  Full                                                        +---------+---------------+---------+-----------+----------+--------------+ FV Mid   Full                                                        +---------+---------------+---------+-----------+----------+--------------+ FV DistalFull                                                        +---------+---------------+---------+-----------+----------+--------------+  PFV      Full                                                        +---------+---------------+---------+-----------+----------+--------------+ POP      Full           Yes      Yes                                 +---------+---------------+---------+-----------+----------+--------------+ PTV      Full                                                        +---------+---------------+---------+-----------+----------+--------------+ PERO     Full                                                         +---------+---------------+---------+-----------+----------+--------------+     Summary: RIGHT: - There is no evidence of deep vein thrombosis in the lower extremity.  - No cystic structure found in the popliteal fossa.  LEFT: - There is no evidence of deep vein thrombosis in the lower extremity.  - No cystic structure found in the popliteal fossa.  *See table(s) above for measurements and observations. Electronically signed by Waverly Ferrari MD on 03/13/2020 at 4:35:46 PM.    Final     Lab Data:  CBC: Recent Labs  Lab 03/15/20 1009 03/16/20 0106 03/16/20 0428 03/16/20 0806 03/17/20 0042 03/18/20 0141  WBC 12.1* 8.2  --  9.5 8.8 9.0  HGB 11.1* 8.7* 9.5* 10.0* 10.4* 10.3*  HCT 32.3* 25.8* 27.2* 29.9* 31.0* 31.3*  MCV 92.8 94.9  --  94.9 95.7 96.6  PLT 201 165  --  199 196 265   Basic Metabolic Panel: Recent Labs  Lab 03/15/20 1009 03/16/20 0106 03/16/20 0806 03/17/20 0042 03/18/20 0141  NA 130* 133* 135 138 138  K 3.2* 3.1* 4.5 4.2 3.3*  CL 95* 102 103 108 106  CO2 24 23 23  21* 25  GLUCOSE 124* 113* 93 86 108*  BUN 19 22 21 14 16   CREATININE 0.71 1.13* 0.95 0.66 0.70  CALCIUM 8.6* 8.0* 8.2* 8.1* 8.4*   GFR: Estimated Creatinine Clearance: 52.9 mL/min (by C-G formula based on SCr of 0.7 mg/dL). Liver Function Tests: Recent Labs  Lab 03/15/20 1009 03/16/20 0106 03/16/20 0806 03/17/20 0042 03/18/20 0141  AST 36 24 33 36 21  ALT 28 23 28 29 26   ALKPHOS 70 49 61 62 68  BILITOT 1.1 0.4 0.8 1.1 0.7  PROT 6.3* 4.9* 5.4* 5.6* 5.4*  ALBUMIN 3.4* 2.5* 2.9* 2.9* 2.8*   Recent Labs  Lab 03/15/20 1009  LIPASE 21   No results for input(s): AMMONIA in the last 168 hours. Coagulation Profile: No results for input(s): INR, PROTIME in the last 168 hours. Cardiac Enzymes: No results for input(s): CKTOTAL, CKMB, CKMBINDEX, TROPONINI in the last 168 hours. BNP (last 3 results) No  results for input(s): PROBNP in the last 8760 hours. HbA1C: No results for input(s):  HGBA1C in the last 72 hours. CBG: No results for input(s): GLUCAP in the last 168 hours. Lipid Profile: No results for input(s): CHOL, HDL, LDLCALC, TRIG, CHOLHDL, LDLDIRECT in the last 72 hours. Thyroid Function Tests: No results for input(s): TSH, T4TOTAL, FREET4, T3FREE, THYROIDAB in the last 72 hours. Anemia Panel: No results for input(s): VITAMINB12, FOLATE, FERRITIN, TIBC, IRON, RETICCTPCT in the last 72 hours. Urine analysis:    Component Value Date/Time   COLORURINE AMBER (A) 03/15/2020 1722   APPEARANCEUR HAZY (A) 03/15/2020 1722   LABSPEC 1.023 03/15/2020 1722   PHURINE 5.0 03/15/2020 1722   GLUCOSEU NEGATIVE 03/15/2020 1722   HGBUR SMALL (A) 03/15/2020 1722   BILIRUBINUR NEGATIVE 03/15/2020 1722   KETONESUR 5 (A) 03/15/2020 1722   PROTEINUR 30 (A) 03/15/2020 1722   UROBILINOGEN 0.2 12/09/2009 0006   NITRITE NEGATIVE 03/15/2020 1722   LEUKOCYTESUR TRACE (A) 03/15/2020 1722     Harshita Bernales M.D. [facility] 03/19/2020, 2:10 PM   Call night coverage person covering after 7pm

## 2020-03-20 DIAGNOSIS — F0150 Vascular dementia without behavioral disturbance: Secondary | ICD-10-CM | POA: Diagnosis not present

## 2020-03-20 DIAGNOSIS — R404 Transient alteration of awareness: Secondary | ICD-10-CM

## 2020-03-20 DIAGNOSIS — S2241XA Multiple fractures of ribs, right side, initial encounter for closed fracture: Secondary | ICD-10-CM | POA: Diagnosis not present

## 2020-03-20 DIAGNOSIS — N390 Urinary tract infection, site not specified: Secondary | ICD-10-CM | POA: Diagnosis not present

## 2020-03-20 LAB — CBC
HCT: 35.2 % — ABNORMAL LOW (ref 36.0–46.0)
Hemoglobin: 11.2 g/dL — ABNORMAL LOW (ref 12.0–15.0)
MCH: 31.8 pg (ref 26.0–34.0)
MCHC: 31.8 g/dL (ref 30.0–36.0)
MCV: 100 fL (ref 80.0–100.0)
Platelets: 325 10*3/uL (ref 150–400)
RBC: 3.52 MIL/uL — ABNORMAL LOW (ref 3.87–5.11)
RDW: 15.7 % — ABNORMAL HIGH (ref 11.5–15.5)
WBC: 9.5 10*3/uL (ref 4.0–10.5)
nRBC: 0 % (ref 0.0–0.2)

## 2020-03-20 LAB — COMPREHENSIVE METABOLIC PANEL
ALT: 26 U/L (ref 0–44)
AST: 29 U/L (ref 15–41)
Albumin: 3 g/dL — ABNORMAL LOW (ref 3.5–5.0)
Alkaline Phosphatase: 86 U/L (ref 38–126)
Anion gap: 13 (ref 5–15)
BUN: 15 mg/dL (ref 8–23)
CO2: 21 mmol/L — ABNORMAL LOW (ref 22–32)
Calcium: 9 mg/dL (ref 8.9–10.3)
Chloride: 104 mmol/L (ref 98–111)
Creatinine, Ser: 0.81 mg/dL (ref 0.44–1.00)
GFR, Estimated: >60 mL/min (ref >60)
Glucose, Bld: 89 mg/dL (ref 70–99)
Potassium: 4.1 mmol/L (ref 3.5–5.1)
Sodium: 138 mmol/L (ref 135–145)
Total Bilirubin: 0.9 mg/dL (ref 0.3–1.2)
Total Protein: 5.9 g/dL — ABNORMAL LOW (ref 6.5–8.1)

## 2020-03-20 NOTE — Discharge Instructions (Signed)
Follow with Primary MD Dema Severin, NP or SNF physician  Get CBC, CMP, in 3 days   Activity: As tolerated with Full fall precautions use walker/cane & assistance as needed   Disposition SNF   Diet: Heart Healthy    On your next visit with your primary care physician please Get Medicines reviewed and adjusted.   Please request your Prim.MD to go over all Hospital Tests and Procedure/Radiological results at the follow up, please get all Hospital records sent to your Prim MD by signing hospital release before you go home.   If you experience worsening of your admission symptoms, develop shortness of breath, life threatening emergency, suicidal or homicidal thoughts you must seek medical attention immediately by calling 911 or calling your MD immediately  if symptoms less severe.  You Must read complete instructions/literature along with all the possible adverse reactions/side effects for all the Medicines you take and that have been prescribed to you. Take any new Medicines after you have completely understood and accpet all the possible adverse reactions/side effects.   Do not drive, operating heavy machinery, perform activities at heights, swimming or participation in water activities or provide baby sitting services if your were admitted for syncope or siezures until you have seen by Primary MD or a Neurologist and advised to do so again.  Do not drive when taking Pain medications.    Do not take more than prescribed Pain, Sleep and Anxiety Medications  Special Instructions: If you have smoked or chewed Tobacco  in the last 2 yrs please stop smoking, stop any regular Alcohol  and or any Recreational drug use.  Wear Seat belts while driving.   Please note  You were cared for by a hospitalist during your hospital stay. If you have any questions about your discharge medications or the care you received while you were in the hospital after you are discharged, you can call the  unit and asked to speak with the hospitalist on call if the hospitalist that took care of you is not available. Once you are discharged, your primary care physician will handle any further medical issues. Please note that NO REFILLS for any discharge medications will be authorized once you are discharged, as it is imperative that you return to your primary care physician (or establish a relationship with a primary care physician if you do not have one) for your aftercare needs so that they can reassess your need for medications and monitor your lab values.

## 2020-03-20 NOTE — Progress Notes (Signed)
[facility]                                                                              Patient Demographics  Jordan Horne, is a 86 y.o. female, DOB - 03-Sep-1932, ZHY:[id number]  Admit date - 03/07/2020   Admitting Physician Ripudeep Jenna Luo, MD  Outpatient Primary MD for the patient is Dema Severin, NP  Outpatient specialists:   LOS - 12  days   Medical records reviewed and are as summarized below:  Brief summary   Jordan Horne is a 86 y.o. female with medical history significant for mild dementia, HLD, hypothyroidism who presents after a fall at home. She was walking at home today when she fell down 3 steps at about 4:30 PM. Patient states she just tripped and fell. Patient states she hit the back of her head and landed on her right side. She is having moderate to severe pain in the back of her head and neck. Patient states she is having pain also on the right side of her ribs. She did not take anything for the pain at home.She denies any shortness of breath. No abdominal pain. She did not have LOC. No seizure activity reported.  Per husband, patient has completed both Covid vaccinations x2 and had received booster last week as well.   Subjective:   Jordan Horne acute events overnight, as I have discussed staff, she had a good night sleep, her appetite has improved as well.    Assessment & Plan      Multiple fractures of ribs, right side, initial encounter for closed fracture -Secondary to mechanical fall at home, fractures of right eighth, ninth ribs with intractable pain -Lumbar spine x-ray negative for any fractures -PT evaluation recommended SNF versus home health PT with 24/7 support.  Family requested SNF, social work consult placed -She is on scheduled Tylenol, Lidoderm patch, I and as needed p.o. oxycodone .  Acute metabolic encephalopathy - it does appear she did start to develop some dementia over last few months. -She does appear  to be with some baseline dementia, significantly confused during hospital stay, at some point lethargic, work-up significant for abnormal TSH, low B12, and UTI,. -She was significant hospital delirium, this has improved on Seroquel, this morning she is pleasant, follow commands, answering questions, remains confused though. - Continue with B12 supplements  Hypkalemia/hyponatremia -Repleted  COVID-19 positive -Incidental COVID-19 positive on admission. Has pleuritic chest pain from the fractures of right eighth ninth ribs.  Had vaccination x2, booster last week.   -Continue pain control incentive spirometry, O2 sats 98% on room air -Chest x-ray clear on 12/14 -Per pharmacy, does not meet criteria for Mab infusion due to partially or fully vaccinated for COVID-19, asymptomatic with a cycle time> 32 -Currently stable, O2 sats 99% on room air, asymptomatic -D-dimers is elevated, but trending down, Doppler negative for DVT.   -Covid isolation expired 12/25, she is currently on regular floor  Hyperlipidemia Continue statin  Hypothyroidism -TSH elevated at 14, have increased her Synthroid from 75 to 100 mcg .  We will need to recheck TSH level in 4 to 6  weeks.  Urinary retention -Hypotension, for which she has required Foley insertion, work-up significant for UTI, Foley catheter discontinued 12/26, so far no evidence of retention.  UTI -Treated with Rocephin x3 days.  Code Status: Full CODE STATUS DVT Prophylaxis:  Lovenox  Family Communication: Discussed with husband by phone12/20/2021, 12/22, 12/23, 12/24, 12/25. None at bedside today   Disposition Plan:     Status is: Inpatient  The patient will require care spanning > 2 midnights and Dispo: The patient is from: Home              Anticipated d/c is to: SNF              Anticipated d/c date is: 1 day              Patient currently is medically stable to d/c.   Is medically stable for discharge, awaiting SNF bed  availability.     Procedures:  None Consultants:   None  Antimicrobials:   Anti-infectives (From admission, onward)   Start     Dose/Rate Route Frequency Ordered Stop   03/16/20 0800  cefTRIAXone (ROCEPHIN) 1 g in sodium chloride 0.9 % 100 mL IVPB        1 g 200 mL/hr over 30 Minutes Intravenous Every 24 hours 03/16/20 0713           Medications  Scheduled Meds: . (feeding supplement) PROSource Plus  30 mL Oral BID BM  . acetaminophen  650 mg Oral TID  . aspirin EC  81 mg Oral Daily  . bisacodyl  10 mg Rectal Once  . Chlorhexidine Gluconate Cloth  6 each Topical Daily  . cyanocobalamin  1,000 mcg Intramuscular Weekly  . enoxaparin (LOVENOX) injection  40 mg Subcutaneous Q1200  . feeding supplement  237 mL Oral TID BM  . levothyroxine  100 mcg Oral Daily  . lidocaine  1 patch Transdermal Q24H  . pantoprazole  40 mg Oral BID AC  . QUEtiapine  50 mg Oral QHS  . rosuvastatin  10 mg Oral Daily  . senna-docusate  2 tablet Oral BID   Continuous Infusions: . cefTRIAXone (ROCEPHIN)  IV 1 g (03/19/20 1357)   PRN Meds:.acetaminophen **OR** acetaminophen, oxyCODONE       Objective:   Vitals:   03/19/20 0622 03/19/20 1516 03/19/20 2130 03/20/20 0627  BP: (!) 115/52 117/62 (!) 112/57 140/67  Pulse: 79 84 93 89  Resp: 16 16 20 18   Temp: 99.8 F (37.7 C) 98.4 F (36.9 C) 97.9 F (36.6 C) 98.3 F (36.8 C)  TempSrc: Axillary Oral    SpO2: 90% 98% 91% 96%  Weight:      Height:        Intake/Output Summary (Last 24 hours) at 03/20/2020 1218 Last data filed at 03/19/2020 1429 Gross per 24 hour  Intake 180 ml  Output --  Net 180 ml     Wt Readings from Last 3 Encounters:  03/10/20 84.4 kg  12/02/19 72.2 kg  09/09/19 73.5 kg   Physical Exam  Awake, alert, confused, pleasant, following commands, answering some questions . Symmetrical Chest wall movement, Good air movement bilaterally, CTAB RRR,No Gallops,Rubs or new Murmurs, No Parasternal Heave +ve  B.Sounds, Abd Soft, No tenderness, No rebound - guarding or rigidity. No Cyanosis, Clubbing or edema, No new Rash or bruise    Data Reviewed:  I have personally reviewed following labs and imaging studies  Micro Results No results found for this or any previous visit (from  the past 240 hour(s)).  Radiology Reports DG Ribs Unilateral W/Chest Right  Result Date: 03/07/2020 CLINICAL DATA:  Fall, right chest pain EXAM: RIGHT RIBS AND CHEST - 3+ VIEW COMPARISON:  None. FINDINGS: Single view radiograph of the chest and two view radiograph of the right ribs demonstrates acute minimally displaced fractures of the right eighth and ninth ribs posteriorly. Minimal left basilar atelectasis or infiltrate. Lungs are otherwise clear. No pneumothorax or pleural effusion. Cardiac size is at the upper limits of normal. Pulmonary vascularity is normal. IMPRESSION: Acute minimally displaced right 8, 9 rib fractures. No pneumothorax. Electronically Signed   By: Helyn Numbers MD   On: 03/07/2020 20:05   DG Thoracic Spine 2 View  Result Date: 03/07/2020 CLINICAL DATA:  Fall, back pain EXAM: THORACIC SPINE 2 VIEWS COMPARISON:  04/24/2018 FINDINGS: Two view radiograph thoracic spine demonstrates normal thoracic kyphosis. Mild thoracic dextroscoliosis, apex right at T5, unchanged. No acute fracture or listhesis of the thoracic spine. Vertebral body height has been preserved. There is diffuse intervertebral disc space narrowing and endplate remodeling throughout the thoracic spine in keeping with changes of diffuse severe degenerative disc disease. The paraspinal soft tissues are unremarkable. Incidental note is made of a small hiatal hernia. IMPRESSION: No acute fracture or listhesis. Electronically Signed   By: Helyn Numbers MD   On: 03/07/2020 20:02   DG Lumbar Spine Complete  Result Date: 03/07/2020 CLINICAL DATA:  Fall, back pain EXAM: LUMBAR SPINE - COMPLETE 4+ VIEW COMPARISON:  None. FINDINGS: Five view  radiograph lumbar spine. Normal lumbar lordosis. No acute fracture or listhesis of the lumbar spine. Vertebral body height has been preserved. There is diffuse severe intervertebral disc space narrowing and endplate remodeling with multilevel vacuum disc phenomena in keeping with changes of diffuse severe degenerative disc disease throughout the lumbar spine. Oblique views demonstrate no evidence of pars defect. Paraspinal soft tissues are unremarkable. IMPRESSION: No acute fracture or listhesis. Electronically Signed   By: Helyn Numbers MD   On: 03/07/2020 20:06   DG Abd 1 View  Result Date: 03/15/2020 CLINICAL DATA:  Nausea and vomiting EXAM: ABDOMEN - 1 VIEW COMPARISON:  08/13/2018 FINDINGS: Bowel gas pattern is unremarkable. Cholecystectomy clips. No acute osseous abnormality. IMPRESSION: Normal bowel gas pattern. Electronically Signed   By: Guadlupe Spanish M.D.   On: 03/15/2020 10:41   CT HEAD WO CONTRAST  Result Date: 03/15/2020 CLINICAL DATA:  Delirium EXAM: CT HEAD WITHOUT CONTRAST TECHNIQUE: Contiguous axial images were obtained from the base of the skull through the vertex without intravenous contrast. COMPARISON:  03/07/2020 FINDINGS: Clinical note: Examination is mildly degraded by patient motion. Brain: No evidence of acute infarction, hemorrhage, hydrocephalus, extra-axial collection or mass lesion/mass effect. Mild low-density changes within the periventricular and subcortical white matter compatible with chronic microvascular ischemic change. Mild diffuse cerebral volume loss. Vascular: Atherosclerotic calcifications involving the large vessels of the skull base. No unexpected hyperdense vessel. Skull: No evidence of calvarial fracture. Sinuses/Orbits: No acute finding. Other: None. IMPRESSION: 1. No acute intracranial findings. 2. Chronic microvascular ischemic change and cerebral volume loss. Electronically Signed   By: Duanne Guess D.O.   On: 03/15/2020 16:50   CT Head Wo  Contrast  Result Date: 03/07/2020 CLINICAL DATA:  Head trauma EXAM: CT HEAD WITHOUT CONTRAST TECHNIQUE: Contiguous axial images were obtained from the base of the skull through the vertex without intravenous contrast. COMPARISON:  CT brain 08/12/2019 FINDINGS: Brain: No acute territorial infarction, hemorrhage, or intracranial mass. Mild atrophy. Mild chronic  small vessel ischemic changes of the white matter. Chronic appearing lacunar infarcts in the basal ganglia. Stable punctate calcifications in the left periventricular white matter. Stable ventricle size. Vascular: No hyperdense vessels.  Carotid vascular calcification Skull: Normal. Negative for fracture or focal lesion. Sinuses/Orbits: Mild mucosal thickening in the sinuses Other: None IMPRESSION: 1. No CT evidence for acute intracranial abnormality. 2. Atrophy and mild chronic small vessel ischemic changes of the white matter. Electronically Signed   By: Jasmine Pang M.D.   On: 03/07/2020 20:21   CT Cervical Spine Wo Contrast  Result Date: 03/07/2020 CLINICAL DATA:  Trauma EXAM: CT CERVICAL SPINE WITHOUT CONTRAST TECHNIQUE: Multidetector CT imaging of the cervical spine was performed without intravenous contrast. Multiplanar CT image reconstructions were also generated. COMPARISON:  08/12/2019 FINDINGS: Alignment: Stable alignment with trace retrolisthesis C5 on C6 and trace anterolisthesis C6 on C7. Facet alignment is maintained. Skull base and vertebrae: No acute fracture. No primary bone lesion or focal pathologic process. Soft tissues and spinal canal: No prevertebral fluid or swelling. No visible canal hematoma. Disc levels: Multiple level degenerative changes with moderate to marked disease at C4-C5 and C5-C6. Facet degenerative change at multiple levels. Upper chest: Negative.  No appreciable thyroid tissue identified. Other: None IMPRESSION: Stable alignment of the cervical spine with degenerative changes. No acute osseous abnormality.  Electronically Signed   By: Jasmine Pang M.D.   On: 03/07/2020 20:26   VAS [place] LOWER EXTREMITY VENOUS (DVT)  Result Date: 03/13/2020  Lower Venous DVT Study Other Indications: Covid, Elevated D-Dimer. Comparison Study: No previous exam Performing Technologist: Clint Guy RVT  Examination Guidelines: A complete evaluation includes B-mode imaging, spectral Doppler, color Doppler, and power Doppler as needed of all accessible portions of each vessel. Bilateral testing is considered an integral part of a complete examination. Limited examinations for reoccurring indications may be performed as noted. The reflux portion of the exam is performed with the patient in reverse Trendelenburg.  +---------+---------------+---------+-----------+----------+--------------+ RIGHT    CompressibilityPhasicitySpontaneityPropertiesThrombus Aging +---------+---------------+---------+-----------+----------+--------------+ CFV      Full           Yes      Yes                                 +---------+---------------+---------+-----------+----------+--------------+ SFJ      Full                                                        +---------+---------------+---------+-----------+----------+--------------+ FV Prox  Full                                                        +---------+---------------+---------+-----------+----------+--------------+ FV Mid   Full                                                        +---------+---------------+---------+-----------+----------+--------------+ FV DistalFull                                                        +---------+---------------+---------+-----------+----------+--------------+  PFV      Full                                                        +---------+---------------+---------+-----------+----------+--------------+ POP      Full           Yes      Yes                                  +---------+---------------+---------+-----------+----------+--------------+ PTV      Full                                                        +---------+---------------+---------+-----------+----------+--------------+ PERO     Full                                                        +---------+---------------+---------+-----------+----------+--------------+   +---------+---------------+---------+-----------+----------+--------------+ LEFT     CompressibilityPhasicitySpontaneityPropertiesThrombus Aging +---------+---------------+---------+-----------+----------+--------------+ CFV      Full           Yes      Yes                                 +---------+---------------+---------+-----------+----------+--------------+ SFJ      Full                                                        +---------+---------------+---------+-----------+----------+--------------+ FV Prox  Full                                                        +---------+---------------+---------+-----------+----------+--------------+ FV Mid   Full                                                        +---------+---------------+---------+-----------+----------+--------------+ FV DistalFull                                                        +---------+---------------+---------+-----------+----------+--------------+ PFV      Full                                                        +---------+---------------+---------+-----------+----------+--------------+  POP      Full           Yes      Yes                                 +---------+---------------+---------+-----------+----------+--------------+ PTV      Full                                                        +---------+---------------+---------+-----------+----------+--------------+ PERO     Full                                                         +---------+---------------+---------+-----------+----------+--------------+     Summary: RIGHT: - There is no evidence of deep vein thrombosis in the lower extremity.  - No cystic structure found in the popliteal fossa.  LEFT: - There is no evidence of deep vein thrombosis in the lower extremity.  - No cystic structure found in the popliteal fossa.  *See table(s) above for measurements and observations. Electronically signed by Waverly Ferrari MD on 03/13/2020 at 4:35:46 PM.    Final     Lab Data:  CBC: Recent Labs  Lab 03/16/20 0106 03/16/20 1610 03/16/20 9604 03/17/20 0042 03/18/20 0141 03/20/20 0622  WBC 8.2  --  9.5 8.8 9.0 9.5  HGB 8.7* 9.5* 10.0* 10.4* 10.3* 11.2*  HCT 25.8* 27.2* 29.9* 31.0* 31.3* 35.2*  MCV 94.9  --  94.9 95.7 96.6 100.0  PLT 165  --  199 196 265 325   Basic Metabolic Panel: Recent Labs  Lab 03/16/20 0106 03/16/20 0806 03/17/20 0042 03/18/20 0141 03/20/20 0622  NA 133* 135 138 138 138  K 3.1* 4.5 4.2 3.3* 4.1  CL 102 103 108 106 104  CO2 23 23 21* 25 21*  GLUCOSE 113* 93 86 108* 89  BUN CREATININE 1.13* 0.95 0.66 0.70 0.81  CALCIUM 8.0* 8.2* 8.1* 8.4* 9.0   GFR: Estimated Creatinine Clearance: 52.3 mL/min (by C-G formula based on SCr of 0.81 mg/dL). Liver Function Tests: Recent Labs  Lab 03/16/20 0106 03/16/20 0806 03/17/20 0042 03/18/20 0141 03/20/20 0622  AST 24 33 36 21 29  ALT ALKPHOS 49 61 62 68 86  BILITOT 0.4 0.8 1.1 0.7 0.9  PROT 4.9* 5.4* 5.6* 5.4* 5.9*  ALBUMIN 2.5* 2.9* 2.9* 2.8* 3.0*   Recent Labs  Lab 03/15/20 1009  LIPASE 21   No results for input(s): AMMONIA in the last 168 hours. Coagulation Profile: No results for input(s): INR, PROTIME in the last 168 hours. Cardiac Enzymes: No results for input(s): CKTOTAL, CKMB, CKMBINDEX, TROPONINI in the last 168 hours. BNP (last 3 results) No results for input(s): PROBNP in the last 8760 hours. HbA1C: No results for input(s): HGBA1C  in the last 72 hours. CBG: No results for input(s): GLUCAP in the last 168 hours. Lipid Profile: No results for input(s): CHOL, HDL, LDLCALC, TRIG, CHOLHDL, LDLDIRECT in the last 72 hours. Thyroid Function Tests: No results for input(s): TSH, T4TOTAL, FREET4, T3FREE, THYROIDAB in the  last 72 hours. Anemia Panel: No results for input(s): VITAMINB12, FOLATE, FERRITIN, TIBC, IRON, RETICCTPCT in the last 72 hours. Urine analysis:    Component Value Date/Time   COLORURINE AMBER (A) 03/15/2020 1722   APPEARANCEUR HAZY (A) 03/15/2020 1722   LABSPEC 1.023 03/15/2020 1722   PHURINE 5.0 03/15/2020 1722   GLUCOSEU NEGATIVE 03/15/2020 1722   HGBUR SMALL (A) 03/15/2020 1722   BILIRUBINUR NEGATIVE 03/15/2020 1722   KETONESUR 5 (A) 03/15/2020 1722   PROTEINUR 30 (A) 03/15/2020 1722   UROBILINOGEN 0.2 12/09/2009 0006   NITRITE NEGATIVE 03/15/2020 1722   LEUKOCYTESUR TRACE (A) 03/15/2020 1722     Jelitza Manninen M.D. [facility] 03/20/2020, 12:18 PM   Call night coverage person covering after 7pm

## 2020-03-20 NOTE — Consult Note (Signed)
I have been asked to see the patient by Dr. Huey Bienenstock, for evaluation and management of urinary retention.  History of present illness: 86 yo womanwith medical history significant formild dementia, HLD, hypothyroidism who admitted after fall resulting in multiple rib fractures.  She had foley catheter removed yesterday and has had multiple episodes of urinary incontinence and PVR > 500 mL.  Multiple attempts at placing foley were unsuccessful.  Patient reports having 3 vaginal childbirths and bladder tack in the past.     Review of systems: A 12 point comprehensive review of systems was obtained and is negative unless otherwise stated in the history of present illness.  Patient Active Problem List   Diagnosis Date Noted  . Multiple fractures of ribs, right side, initial encounter for closed fracture 03/07/2020  . Intractable pain 03/07/2020  . Dementia (HCC) 03/07/2020  . Fall at home, initial encounter 03/07/2020  . Elevated blood pressure reading 03/07/2020  . Prolonged QT interval 03/07/2020  . Right rib fracture 03/07/2020  . Abnormal nuclear stress test 07/27/2019  . Abnormal electrocardiogram (ECG) (EKG) 05/07/2018  . SBO (small bowel obstruction) (HCC) 03/24/2018  . CAP (community acquired pneumonia) 03/24/2018  . Chest pain of uncertain etiology 09/29/2013  . Hypothyroidism 09/29/2013  . Hypercholesterolemia 09/29/2013    No current facility-administered medications on file prior to encounter.   Current Outpatient Medications on File Prior to Encounter  Medication Sig Dispense Refill  . Artificial Tear Solution (SYSTANE CONTACTS OP) Place 1 drop into both eyes daily.    . divalproex (DEPAKOTE) 125 MG DR tablet Take 125 mg by mouth daily.    Marland Kitchen levothyroxine (SYNTHROID) 75 MCG tablet Take 75 mcg by mouth daily.    . meloxicam (MOBIC) 7.5 MG tablet Take 7.5 mg by mouth daily.    . nitroGLYCERIN (NITROSTAT) 0.4 MG SL tablet Place 0.4 mg under the tongue every 5 (five)  minutes as needed for chest pain (max 3 doses).    . pantoprazole (PROTONIX) 40 MG tablet Take 1 tablet (40 mg total) by mouth daily. 30 tablet 0  . rosuvastatin (CRESTOR) 10 MG tablet Take 10 mg by mouth daily.    . nitroGLYCERIN (NITROSTAT) 0.4 MG SL tablet Place 1 tablet (0.4 mg total) under the tongue every 5 (five) minutes as needed for chest pain. (Patient not taking: Reported on 03/08/2020) 25 tablet 3  . valACYclovir (VALTREX) 1000 MG tablet Take 1,000 mg by mouth See admin instructions. 21 dispensed for a 7 day supply on 02/28/20      Past Medical History:  Diagnosis Date  . Abnormal electrocardiogram (ECG) (EKG) 05/07/2018  . CAP (community acquired pneumonia) 03/24/2018  . Chest pain of uncertain etiology 09/29/2013  . Dementia (HCC)   . Glaucoma   . Hypercholesterolemia 09/29/2013  . Hypertension   . Hypothyroidism 09/29/2013  . SBO (small bowel obstruction) (HCC) 03/24/2018  . Thyroid disease     Past Surgical History:  Procedure Laterality Date  . ABDOMINAL HYSTERECTOMY    . THYROID CYST EXCISION      Social History   Tobacco Use  . Smoking status: Never Smoker  . Smokeless tobacco: Never Used  Substance Use Topics  . Alcohol use: Yes    Alcohol/week: 1.0 standard drink    Types: 1 Glasses of wine per week    Comment: just occasional  . Drug use: No    Family History  Problem Relation Age of Onset  . Arthritis Mother   . Heart failure Mother   .  Heart attack Father   . Cancer Brother     PE: Vitals:   03/19/20 1516 03/19/20 2130 03/20/20 0627 03/20/20 1500  BP: 117/62 (!) 112/57 140/67 (!) 151/83  Pulse: 84 93 89 94  Resp: 16 20 18 18   Temp: 98.4 F (36.9 C) 97.9 F (36.6 C) 98.3 F (36.8 C) 98.9 F (37.2 C)  TempSrc: Oral   Oral  SpO2: 98% 91% 96% 96%  Weight:      Height:       Patient appears to be in no acute distress  patient is alert Atraumatic normocephalic head No cervical or supraclavicular lymphadenopathy appreciated No increased  work of breathing, no audible wheezes/rhonchi Regular sinus rhythm/rate Abdomen is soft, nontender, nondistended, no CVA or suprapubic tenderness GU: mild introital stensosis, urethral meatus retracted back into vagina Lower extremities are symmetric without appreciable edema Grossly neurologically intact No identifiable skin lesions  Recent Labs    03/18/20 0141 03/20/20 0622  WBC 9.0 9.5  HGB 10.3* 11.2*  HCT 31.3* 35.2*   Recent Labs    03/18/20 0141 03/20/20 0622  NA 138 138  K 3.3* 4.1  CL 106 104  CO2 25 21*  GLUCOSE 108* 89  BUN 16 15  CREATININE 0.70 0.81  CALCIUM 8.4* 9.0   No results for input(s): LABPT, INR in the last 72 hours. No results for input(s): LABURIN in the last 72 hours. Results for orders placed or performed during the hospital encounter of 03/07/20  Resp Panel by RT-PCR (Flu A&B, Covid) Nasopharyngeal Swab     Status: Abnormal   Collection Time: 03/07/20 11:08 PM   Specimen: Nasopharyngeal Swab; Nasopharyngeal(NP) swabs in vial transport medium  Result Value Ref Range Status   SARS Coronavirus 2 by RT PCR POSITIVE (A) NEGATIVE Final    Comment: RESULT CALLED TO, READ BACK BY AND VERIFIED WITH: 03/09/20, A RN 03/08/20 at 1224 sk (NOTE) SARS-CoV-2 target nucleic acids are DETECTED.  The SARS-CoV-2 RNA is generally detectable in upper respiratory specimens during the acute phase of infection. Positive results are indicative of the presence of the identified virus, but do not rule out bacterial infection or co-infection with other pathogens not detected by the test. Clinical correlation with patient history and other diagnostic information is necessary to determine patient infection status. The expected result is Negative.  Fact Sheet for Patients: 03/10/20  Fact Sheet for Healthcare Providers: BloggerCourse.com  This test is not yet approved or cleared by the [employer]ious[employer].[place] FDA and   has been authorized for detection and/or diagnosis of SARS-CoV-2 by FDA under an Emergency Use Authorization (EUA).  This EUA will remain in effect (meaning this test can be  used) for the duration of  the COVID-19 declaration under Section 564(b)(1) of the Act, 21 U.S.C. section 360bbb-3(b)(1), unless the authorization is terminated or revoked sooner.     Influenza A by PCR NEGATIVE NEGATIVE Final   Influenza B by PCR NEGATIVE NEGATIVE Final    Comment: (NOTE) The Xpert Xpress SARS-CoV-2/FLU/RSV plus assay is intended as an aid in the diagnosis of influenza from Nasopharyngeal swab specimens and should not be used as a sole basis for treatment. Nasal washings and aspirates are unacceptable for Xpert Xpress SARS-CoV-2/FLU/RSV testing.  Fact Sheet for Patients: [place]  Fact Sheet for Healthcare Providers: BloggerCourse.com  This test is not yet approved or cleared by the [employer]ious[employer]ker.[employer] FDA and has been authorized for detection and/or diagnosis of SARS-CoV-2 by FDA under an Emergency Use Authorization (EUA). This  EUA will remain in effect (meaning this test can be used) for the duration of the COVID-19 declaration under Section 564(b)(1) of the Act, 21 U.S.C. section 360bbb-3(b)(1), unless the authorization is terminated or revoked.  Performed at [facility], [address] [place], [place] [address]     Imaging: KUB 03/15/20 IMPRESSION: Normal bowel gas pattern.   Electronically Signed   By: Guadlupe Spanish M.D.   On: 03/15/2020 10:43  Imp: 86 year old woman currently admitted to the hospital following a fall resulting in rib fractures now with acute urinary retention and difficulty placing Foley catheter.  Recommendations: 1. Foley catheter placement: Patient was prepped and draped in the usual sterile fashion.  A 14 French coud Foley was placed into the urethral meatus which was noted to be  retracted back into the vagina.  A finger was used to gently guide the Foley catheter towards the 12:00 location and the catheter was inflated with 10 cc of sterile water.  500 cc of urine drained immediately  2.  Urinary retention: Recommend leaving Foley catheter for 3 to 5 days for maximum bladder decompression.  She may be discharged with a Foley catheter if she is ready to go home and have a follow-up at [facility] for voiding trial   Thank you for involving me in this patient's care. Please page with any further questions or concerns. Izaih Kataoka D Rebbecca Osuna

## 2020-03-20 NOTE — TOC Progression Note (Signed)
Transition of Care [facility]) - Progression Note    Patient Details  Name: Jordan Horne MRN: [id number] Date of Birth: February 15, 1933  Transition of Care [facility]) CM/SW Contact  Mearl Latin, LCSW Phone Number: 03/20/2020, 9:28 AM  Clinical Narrative:    CSW received call from [facility] that they are no longer able to accept patient due to patient requiring a sitter and increased seroquel. Will expand snf bed search now that patient is off of isolation.    Expected Discharge Plan: Skilled Nursing Facility Barriers to Discharge: SNF Pending bed offer,Continued Medical Work up  Expected Discharge Plan and Services Expected Discharge Plan: Skilled Nursing Facility   Discharge Planning Services: CM Consult Post Acute Care Choice: Skilled Nursing Facility Living arrangements for the past 2 months: Single Family Home                           HH Arranged: PT,OT,Nurse's Aide,Social Work [employer] Agency: [facility] Date [facility] Agency Contacted: 03/09/20 Time HH Agency Contacted: [phone] Representative spoke with at [facility] Agency: Wayne Both   Social Determinants of Health (SDOH) Interventions    Readmission Risk Interventions No flowsheet data found.

## 2020-03-20 NOTE — Progress Notes (Signed)
Occupational Therapy Treatment Patient Details Name: Jordan Horne MRN: [id number] DOB: Apr 10, 1932 Today's Date: 03/20/2020    History of present illness Pt is an 86 y.o. female admitted 03/07/20 after fall down steps hitting the back of her head with no LOC. Pt sustained R 8-9th rib fxs. Incidental (+) COVID-19; pt fully vaccinated. CXR clear. PMH includes mild dementia.   OT comments  Pt progressing gradually towards goals. OT entering as pt set off bed alarm and reporting need to use bathroom. Pt requires frequent cues for RW use for [facility] transfer at Mod A. Pt able to progress sit to stand transfers from Min A to min guard, but due to impulsivity and decreased carryover for RW use, pt poses as high fall risk. Pt overall Max A for toileting hygiene and Min A for UB ADLs. Plan to progress safety/balance during ADLs and continue to improve RW use carryover.    Follow Up Recommendations  SNF;Supervision/Assistance - 24 hour    Equipment Recommendations  Wheelchair (measurements OT);Wheelchair cushion (measurements OT)    Recommendations for Other Services      Precautions / Restrictions Precautions Precautions: Fall Restrictions Weight Bearing Restrictions: No       Mobility Bed Mobility Overal bed mobility: Needs Assistance Bed Mobility: Supine to Sit;Sit to Supine     Supine to sit: Supervision;HOB elevated Sit to supine: Supervision   General bed mobility comments: Supervision for safety and cueing. Pt impulsive with movements, attempting to get OOB on OT entry setting off bed alarm  Transfers Overall transfer level: Needs assistance Equipment used: Rolling walker (2 wheeled) Transfers: Sit to/from [employer] Sit to Stand: Min assist Stand pivot transfers: Mod assist       General transfer comment: Min A to min guard for sit to stand with RW (cues needed throughout for RW use), Mod A for pivot with assistance to manuever RW safely as pt impulsive  and tending to leave RW behind    Balance Overall balance assessment: Needs assistance Sitting-balance support: No upper extremity supported;Feet unsupported;Single extremity supported Sitting balance-Leahy Scale: Fair     Standing balance support: Bilateral upper extremity supported;During functional activity;Single extremity supported Standing balance-Leahy Scale: Poor Standing balance comment: Reliant on UE support                           ADL either performed or assessed with clinical judgement   ADL Overall ADL's : Needs assistance/impaired     Grooming: Supervision/safety;Bed level;Wash/dry face Grooming Details (indicate cue type and reason): Supervision to wash face/hands with washcloth in bed         Upper Body Dressing : Minimal assistance;Bed level Upper Body Dressing Details (indicate cue type and reason): Min A to doff/don new hospital gown in bed     Toilet Transfer: Moderate assistance;Stand-pivot;BSC;RW Toilet Transfer Details (indicate cue type and reason): Mod A overall for safety and manuevering RW to/from [facility] Toileting- Clothing Manipulation and Hygiene: Maximal assistance;Sit to/from stand Toileting - Clothing Manipulation Details (indicate cue type and reason): Unable to reach well enough for peri care sitting on BSC, Max A for hygiene in standing       General ADL Comments: Poor safety awareness and high fall risk. Able to be redirected to tasks     Vision   Vision Assessment?: No apparent visual deficits   Perception     Praxis      Cognition Arousal/Alertness: Awake/alert Behavior During Therapy: [facility] for  tasks assessed/performed Overall Cognitive Status: History of cognitive impairments - at baseline Area of Impairment: Orientation;Attention;Memory;Following commands;Safety/judgement;Awareness;Problem solving                 Orientation Level: Disoriented to;Place;Time;Situation Current Attention Level:  Sustained Memory: Decreased recall of precautions;Decreased short-term memory Following Commands: Follows one step commands with increased time Safety/Judgement: Decreased awareness of safety;Decreased awareness of deficits Awareness: Emergent Problem Solving: Slow processing;Decreased initiation;Difficulty sequencing;Requires verbal cues;Requires tactile cues General Comments: Pt reports she fell and injured ribs, but unable to state where she is, etc. pt reporting need to use bathroom on OT entry, requires frequent cueing for safety and use of RW        Exercises     Shoulder Instructions       General Comments Noted with rash on inner thigh - RN aware    Pertinent Vitals/ Pain       Pain Assessment: Faces Faces Pain Scale: Hurts little more Pain Location: ribs Pain Descriptors / Indicators: Guarding;Grimacing;Moaning Pain Intervention(s): Monitored during session;Repositioned  Home Living                                          Prior Functioning/Environment              Frequency  Min 2X/week        Progress Toward Goals  OT Goals(current goals can now be found in the care plan section)  Progress towards OT goals: Progressing toward goals  Acute Rehab OT Goals Patient Stated Goal: get to the bathroom OT Goal Formulation: With patient Time For Goal Achievement: 03/24/20 Potential to Achieve Goals: Good ADL Goals Pt Will Perform Eating: with min assist;sitting;with adaptive utensils Pt Will Perform Grooming: with min assist;sitting Pt Will Transfer to Toilet: with min assist;stand pivot transfer;ambulating;regular height toilet;grab bars Additional ADL Goal #1: Pt will follow one step ADL commands with min VC's Additional ADL Goal #2: Pt will sequence 2 step ADL commands with min VC's  Plan Discharge plan remains appropriate;Frequency remains appropriate    Co-evaluation                 AM-PAC OT "6 Clicks" Daily Activity      Outcome Measure   Help from another person eating meals?: A Little Help from another person taking care of personal grooming?: A Little Help from another person toileting, which includes using toliet, bedpan, or urinal?: A Lot Help from another person bathing (including washing, rinsing, drying)?: A Lot Help from another person to put on and taking off regular upper body clothing?: A Little Help from another person to put on and taking off regular lower body clothing?: Total 6 Click Score: 14    End of Session Equipment Utilized During Treatment: Gait belt  OT Visit Diagnosis: Unsteadiness on feet (R26.81);Other abnormalities of gait and mobility (R26.89);Muscle weakness (generalized) (M62.81);Other symptoms and signs involving cognitive function   Activity Tolerance Patient tolerated treatment well   Patient Left in bed;with call bell/phone within reach;with bed alarm set;Other (comment) (low bed with fall mats)   Nurse Communication Mobility status;Other (comment) (rash on inner thigh, BM)        Time: 1941-7408 OT Time Calculation (min): 27 min  Charges: OT General Charges $OT Visit: 1 Visit OT Treatments $Self Care/Home Management : 23-37 mins  Lorre Munroe, OTR/L   Lorre Munroe 03/20/2020, 1:28 PM

## 2020-03-20 NOTE — Progress Notes (Signed)
During day shift, pt had a total measurable output of 1,650 mL (purewick, BSC, Foley catheter). Pt also had at least 4 unmeasured occurrences on the bedding. 2 bladder scans were completed by NT. First one was 1000 mL at lunchtime and second was 517 mL just prior to Dr. Arita Miss inserting catheter.  There were 2 attempts at inserting a Foley Catheter and 2 attempts at doing an in and out catheter that were unsuccessful on the unit (2 RNs, 1 NT +3). Notified MD. Urology was called in and urology cart was bedside for assistance.  Pt pulled catheter apart where end of coude hooked to drainage back at shift change. Hooked it back together and secured with tape.

## 2020-03-20 NOTE — TOC Progression Note (Signed)
Transition of Care [facility]) - Progression Note    Patient Details  Name: [name] MRN: [id number] Date of Birth: 07/20/1932  Transition of Care [facility]) CM/SW Contact  Jimmy Picket, [place] Phone Number: 03/20/2020, 4:13 PM  Clinical Narrative:     CSW faxed pt out to facilities near pts home, per husbands request. t is no longer in covid isolation and no longer has sitter.   Expected Discharge Plan: Skilled Nursing Facility Barriers to Discharge: SNF Pending bed offer,Continued Medical Work up  Expected Discharge Plan and Services Expected Discharge Plan: Skilled Nursing Facility   Discharge Planning Services: CM Consult Post Acute Care Choice: Skilled Nursing Facility Living arrangements for the past 2 months: Single Family Home                           HH Arranged: PT,OT,Nurse's Aide,Social Work [employer] Agency: [facility] Date [facility] Agency Contacted: 03/09/20 Time HH Agency Contacted: [phone] Representative spoke with at [facility] Agency: Wayne Both   Social Determinants of Health (SDOH) Interventions    Readmission Risk Interventions No flowsheet data found.  Jimmy Picket, Theresia Majors, [place] Clinical Social Worker [phone]

## 2020-03-21 ENCOUNTER — Inpatient Hospital Stay (HOSPITAL_COMMUNITY): Payer: Medicare Other

## 2020-03-21 DIAGNOSIS — S2241XA Multiple fractures of ribs, right side, initial encounter for closed fracture: Secondary | ICD-10-CM | POA: Diagnosis not present

## 2020-03-21 LAB — CBC
HCT: 30.6 % — ABNORMAL LOW (ref 36.0–46.0)
Hemoglobin: 10.4 g/dL — ABNORMAL LOW (ref 12.0–15.0)
MCH: 33.7 pg (ref 26.0–34.0)
MCHC: 34 g/dL (ref 30.0–36.0)
MCV: 99 fL (ref 80.0–100.0)
Platelets: 314 10*3/uL (ref 150–400)
RBC: 3.09 MIL/uL — ABNORMAL LOW (ref 3.87–5.11)
RDW: 15.7 % — ABNORMAL HIGH (ref 11.5–15.5)
WBC: 14.6 10*3/uL — ABNORMAL HIGH (ref 4.0–10.5)
nRBC: 0 % (ref 0.0–0.2)

## 2020-03-21 LAB — BASIC METABOLIC PANEL
Anion gap: 11 (ref 5–15)
BUN: 14 mg/dL (ref 8–23)
CO2: 21 mmol/L — ABNORMAL LOW (ref 22–32)
Calcium: 8.3 mg/dL — ABNORMAL LOW (ref 8.9–10.3)
Chloride: 105 mmol/L (ref 98–111)
Creatinine, Ser: 0.72 mg/dL (ref 0.44–1.00)
GFR, Estimated: >60 mL/min (ref >60)
Glucose, Bld: 105 mg/dL — ABNORMAL HIGH (ref 70–99)
Potassium: 3.8 mmol/L (ref 3.5–5.1)
Sodium: 137 mmol/L (ref 135–145)

## 2020-03-21 LAB — MAGNESIUM: Magnesium: 1.9 mg/dL (ref 1.7–2.4)

## 2020-03-21 LAB — GLUCOSE, CAPILLARY: Glucose-Capillary: 96 mg/dL (ref 70–99)

## 2020-03-21 MED ORDER — MORPHINE SULFATE (PF) 2 MG/ML IV SOLN
2.0000 mg | INTRAVENOUS | Status: DC | PRN
Start: 2020-03-21 — End: 2020-03-31
  Administered 2020-03-21 – 2020-03-26 (×7): 2 mg via INTRAVENOUS
  Filled 2020-03-21 (×8): qty 1

## 2020-03-21 MED ORDER — WHITE PETROLATUM EX OINT
TOPICAL_OINTMENT | CUTANEOUS | Status: AC
  Filled 2020-03-21: qty 28.35

## 2020-03-21 MED ORDER — IOHEXOL 350 MG/ML SOLN
80.0000 mL | Freq: Once | INTRAVENOUS | Status: AC | PRN
  Administered 2020-03-21: 12:00:00 80 mL via INTRAVENOUS

## 2020-03-21 MED ORDER — PROCHLORPERAZINE EDISYLATE 10 MG/2ML IJ SOLN
10.0000 mg | INTRAMUSCULAR | Status: DC | PRN
  Administered 2020-03-21: 09:00:00 10 mg via INTRAVENOUS
  Filled 2020-03-21 (×2): qty 2

## 2020-03-21 NOTE — Progress Notes (Signed)
PROGRESS NOTE    [name]  MVE:[id number] DOB: 04-16-1932 DOA: 03/07/2020 PCP: Dema Severin, NP   Brief Narrative:   Jordan R Presnellis a 86 y.o.femalewith medical history significant formild dementia, HLD, hypothyroidism who presents after a fall at home. She waswalking at home today when she fell down 3 steps at about 4:30 PM. Patient states she just tripped and fell. Patient states she hit the back of her headand landed on her right side. She is having moderate to severe pain in the back of her head and neck. Patient states she is having pain also on the right side of her ribs.She did not take anything for the pain at home.She denies any shortness of breath. No abdominal pain.She did not have LOC. No seizure activity reported. Per husband, patient has completed both Covid vaccinations x2 and had received booster last week as well.  Assessment & Plan:  Multiple fractures rib fractures: -Secondary to mechanical fall at home, fractures of right eighth, ninth ribs with intractable pain -Lumbar spine x-ray negative for any fractures -PT evaluation recommended SNF versus home health PT with 24/7 support.  Family requested SNF, social work consult placed -She is on scheduled Tylenol, Lidoderm patch & as needed p.o. oxycodone .  Acute metabolic encephalopathy - it does appear she did start to develop some dementia over last few months. -She does appear to be with some baseline dementia, significantly confused during hospital stay, at some point lethargic, work-up significant for abnormal TSH, low B12, and UTI, - Continue with Seroquel, B12 supplements and Rocephin for UTI.  Large right-sided pleural effusion: With near complete collapse of right lower lobe. -Significant left lower lobe and posterior lingular airspace collapse as well. -Patient is currently maintaining oxygen saturation on room air. -We will consult IR for thoracentesis.  COVID-19  positive -Incidental COVID-19 positive on admission. Has pleuritic chest pain from the fractures of right eighth ninth ribs.  Had vaccination x2, booster last week.   -Continue pain control incentive spirometry, O2 sats 98% on room air -Chest x-ray clear on 12/14 -Per pharmacy, does not meet criteria for Mab infusion due to partially or fully vaccinated for COVID-19, asymptomatic with a cycle time> 32 -Currently stable, O2 sats 99% on room air, asymptomatic -D-dimers is elevated, but trending down, Doppler negative for DVT.   -Covid isolation expired 12/25.  Hypothyroidism: -TSH elevated at 14, have increased her Synthroid from 75 to 100 mcg .  We will need to recheck TSH level in 4 to 6 weeks.  Urinary retention -Hypotension, for which she has required Foley insertion -Foley catheter placed on 12/27 by urology.  Recommend leaving Foley catheter for 3 to 4 days for maximum bladder decompression and may discharge with the Foley catheter and follow-up with [facility] for voiding trial.    UTI -Continue Rocephin.  Unfortunately urine culture is not collected prior to start of antibiotics.  She is afebrile.  Hypokalemia/hyponatremia -Resolved  Hyperlipidemia: Continue statin  DVT prophylaxis: SCD/Lovenox  code Status: Full code Family Communication:  None present at bedside.  Plan of care discussed with patient in length and he verbalized understanding and agreed with it.  I called patient's husband and discussed plan of care and he verbalized understanding.  Disposition Plan: SNF  Consultants:   Urology  Procedures:   CTA chest  Antimicrobials:   *Rocephin  Status is: Inpatient  Dispo: The patient is from: Home  Anticipated d/c is to: SNF              Anticipated d/c date is: 2 days              Patient currently is not medically stable to d/c.    Subjective: Patient seen and examined.  Moaning in pain and tells me that she is not feeling good and  has complaining of nausea, severe shoulder blade pain mostly on the right side.  Objective: Vitals:   03/20/20 1500 03/20/20 2024 03/21/20 0611 03/21/20 0821  BP: (!) 151/83 118/60 (!) 113/54 110/73  Pulse: 94 98 94 89  Resp: 18 20 20 18   Temp: 98.9 F (37.2 C) 98.7 F (37.1 C) 99 F (37.2 C) 98.7 F (37.1 C)  TempSrc: Oral   Oral  SpO2: 96% 95% 90% 92%  Weight:      Height:        Intake/Output Summary (Last 24 hours) at 03/21/2020 1326 Last data filed at 03/21/2020 0553 Gross per 24 hour  Intake 597 ml  Output 2775 ml  Net -2178 ml   Filed Weights   03/10/20 0233  Weight: 84.4 kg    Examination:  General exam: Moaning in pain, on room air,  Respiratory system: Clear to auscultation. Respiratory effort normal. Cardiovascular system: S1 & S2 heard, RRR. No JVD, murmurs, rubs, gallops or clicks. No pedal edema. Gastrointestinal system: Abdomen is nondistended, soft and nontender. No organomegaly or masses felt. Normal bowel sounds heard.  Foley catheter in placed Central nervous system: Alert and oriented. No focal neurological deficits. Extremities: Symmetric 5 x 5 power. Skin: No rashes, lesions or ulcers   Data Reviewed: I have personally reviewed following labs and imaging studies  CBC: Recent Labs  Lab 03/16/20 0806 03/17/20 0042 03/18/20 0141 03/20/20 0622 03/21/20 0929  WBC 9.5 8.8 9.0 9.5 14.6*  HGB 10.0* 10.4* 10.3* 11.2* 10.4*  HCT 29.9* 31.0* 31.3* 35.2* 30.6*  MCV 94.9 95.7 96.6 100.0 99.0  PLT 199 196 265 325 314   Basic Metabolic Panel: Recent Labs  Lab 03/16/20 0806 03/17/20 0042 03/18/20 0141 03/20/20 0622 03/21/20 0929  NA 135 138 138 138 137  K 4.5 4.2 3.3* 4.1 3.8  CL 103 108 106 104 105  CO2 23 21* 25 21* 21*  GLUCOSE 93 86 108* 89 105*  BUN 21 14 16 15 14   CREATININE 0.95 0.66 0.70 0.81 0.72  CALCIUM 8.2* 8.1* 8.4* 9.0 8.3*  MG  --   --   --   --  1.9   GFR: Estimated Creatinine Clearance: 52.9 mL/min (by C-G formula  based on SCr of 0.72 mg/dL). Liver Function Tests: Recent Labs  Lab 03/16/20 0106 03/16/20 0806 03/17/20 0042 03/18/20 0141 03/20/20 0622  AST 24 33 36 21 29  ALT 23 28 29 26 26   ALKPHOS 49 61 62 68 86  BILITOT 0.4 0.8 1.1 0.7 0.9  PROT 4.9* 5.4* 5.6* 5.4* 5.9*  ALBUMIN 2.5* 2.9* 2.9* 2.8* 3.0*   Recent Labs  Lab 03/15/20 1009  LIPASE 21   No results for input(s): AMMONIA in the last 168 hours. Coagulation Profile: No results for input(s): INR, PROTIME in the last 168 hours. Cardiac Enzymes: No results for input(s): CKTOTAL, CKMB, CKMBINDEX, TROPONINI in the last 168 hours. BNP (last 3 results) No results for input(s): PROBNP in the last 8760 hours. HbA1C: No results for input(s): HGBA1C in the last 72 hours. CBG: Recent Labs  Lab 03/21/20 0825  GLUCAP  96   Lipid Profile: No results for input(s): CHOL, HDL, LDLCALC, TRIG, CHOLHDL, LDLDIRECT in the last 72 hours. Thyroid Function Tests: No results for input(s): TSH, T4TOTAL, FREET4, T3FREE, THYROIDAB in the last 72 hours. Anemia Panel: No results for input(s): VITAMINB12, FOLATE, FERRITIN, TIBC, IRON, RETICCTPCT in the last 72 hours. Sepsis Labs: Recent Labs  Lab 03/15/20 1517 03/16/20 0106 03/17/20 0042  PROCALCITON <0.10 <0.10 <0.10    No results found for this or any previous visit (from the past 240 hour(s)).    Radiology Studies: CT ANGIO CHEST AORTA W/CM & OR WO/CM  Result Date: 03/21/2020 CLINICAL DATA:  Chest pain.  Aortic dissection suspected. EXAM: CT ANGIOGRAPHY CHEST WITH CONTRAST TECHNIQUE: Multidetector CT imaging of the chest was performed using the standard protocol during bolus administration of intravenous contrast. Multiplanar CT image reconstructions and MIPs were obtained to evaluate the vascular anatomy. CONTRAST:  80mL OMNIPAQUE IOHEXOL 350 MG/ML SOLN COMPARISON:  Chest and rib radiographs 03/07/2020 FINDINGS: Cardiovascular: Heart is mildly enlarged. Artery calcifications present. No  significant pericardial effusion is present. Pulmonary arteries are within normal limits. Atherosclerotic calcifications are present at the aortic arch and at the wall the descending thoracic aorta no dissection is present. No aneurysm or focal stenosis is present. Calcifications are present the origins the great vessels, celiac artery, and the superior mesenteric artery without significant stenosis. Mediastinum/Nodes: No significant mediastinal, hilar, or axillary adenopathy is present. Esophagus is within limits. The thoracic inlet is unremarkable. Lungs/Pleura: Large right pleural effusion is present. Much of the right lower lobe is collapsed. Significant left lower lobe and posterior lingular airspace collapse present as well. Airways are patent. Dependent atelectasis is present along the left major fissure. Upper Abdomen: Prominent hiatal hernia is present. Surgical clips are present at gallbladder fossa. Visualized upper abdomen is otherwise unremarkable. Musculoskeletal: Right-sided rib fractures are present in the sixth through tenth rib. Displacement is greatest at the ninth rib. This is adjacent to the fusion. No significant left-sided rib fractures are present. Vertebral body heights are maintained. Exaggerated thoracic kyphosis is present. Sternum is intact. Review of the MIP images confirms the above findings. IMPRESSION: 1. Right-sided rib fractures in the sixth through tenth rib. Displacement is greatest at the ninth rib. 2. Large right pleural effusion with near complete collapse of the right lower lobe. 3. Significant left lower lobe and posterior lingular airspace collapse as well. 4. Mild cardiomegaly without failure. 5. Prominent hiatal hernia. 6. Coronary artery disease. 7. Aortic Atherosclerosis (ICD10-I70.0). Electronically Signed   By: Marin Robertshristopher  Mattern M.D.   On: 03/21/2020 12:12    Scheduled Meds: . (feeding supplement) PROSource Plus  30 mL Oral BID BM  . acetaminophen  650 mg Oral  TID  . aspirin EC  81 mg Oral Daily  . bisacodyl  10 mg Rectal Once  . Chlorhexidine Gluconate Cloth  6 each Topical Daily  . cyanocobalamin  1,000 mcg Intramuscular Weekly  . enoxaparin (LOVENOX) injection  40 mg Subcutaneous Q1200  . feeding supplement  237 mL Oral TID BM  . levothyroxine  100 mcg Oral Daily  . lidocaine  1 patch Transdermal Q24H  . pantoprazole  40 mg Oral BID AC  . QUEtiapine  50 mg Oral QHS  . rosuvastatin  10 mg Oral Daily  . senna-docusate  2 tablet Oral BID  . white petrolatum       Continuous Infusions: . cefTRIAXone (ROCEPHIN)  IV 1 g (03/20/20 1434)     LOS: 13 days  Time spent: 40 minutes   Katrina Daddona Estill Cotta, MD [facility]  If 7PM-7AM, please contact night-coverage [email or web address] 03/21/2020, 1:26 PM

## 2020-03-21 NOTE — Progress Notes (Signed)
Physical Therapy Treatment Patient Details Name: Jordan Horne MRN: [id number] DOB: 31-Jan-1933 Today's Date: 03/21/2020    History of Present Illness Pt is an 86 y.o. female admitted 03/07/20 after fall down steps hitting the back of her head with no LOC. Pt sustained R 8-9th rib fxs. Incidental (+) COVID-19; pt fully vaccinated. CXR clear. PMH includes mild dementia.    PT Comments    Continuing work on functional mobility and activity tolerance;  Session focused on getting OOB, and trying to progress amb distance; walked across the room to the recliner, and noted pt was anxious, and reported dizziness; Attempted to get a standing BP, however pt unable to stand long enough to get a reading; BPs soft after walking in room, feet up and reclined, (See vitals flow sheet.)  Strongly recommend obtaining a full proper set of Orthostatic BPs   Follow Up Recommendations  SNF;Supervision/Assistance - 24 hour     Equipment Recommendations  3in1 (PT);Wheelchair (measurements PT);Wheelchair cushion (measurements PT)    Recommendations for Other Services       Precautions / Restrictions Precautions Precautions: Fall Precaution Comments: Reported dizziness, and had quite the look of anxiety, unsettledness in the recliner; Will conisder getting Orthostatics next session    Mobility  Bed Mobility Overal bed mobility: Needs Assistance Bed Mobility: Supine to Sit     Supine to sit: Min assist     General bed mobility comments: Min handheld assist to pull to sit up on the L side of the bed  Transfers Overall transfer level: Needs assistance Equipment used: Rolling walker (2 wheeled) Transfers: Sit to/from Stand Sit to Stand: Min assist         General transfer comment: MIn assist to steady; needs multimodal cueing to initiate  Ambulation/Gait Ambulation/Gait assistance: Min assist Gait Distance (Feet): 8 Feet Assistive device: Rolling walker (2 wheeled) Gait  Pattern/deviations: Step-to pattern;Shuffle;Trunk flexed;Antalgic     General Gait Details: Slow and quite painful; anxious in standing and unsettled; attempted to get a standing BP, but unable to stand long enough to get a reading   Stairs             Wheelchair Mobility    Modified Rankin (Stroke Patients Only)       Balance     Sitting balance-Leahy Scale: Fair       Standing balance-Leahy Scale: Poor                              Cognition Arousal/Alertness: Awake/alert Behavior During Therapy: WFL for tasks assessed/performed Overall Cognitive Status: History of cognitive impairments - at baseline                   Orientation Level: Disoriented to;Place;Time;Situation Current Attention Level: Sustained Memory: Decreased recall of precautions;Decreased short-term memory Following Commands: Follows one step commands with increased time Safety/Judgement: Decreased awareness of safety;Decreased awareness of deficits Awareness: Emergent          Exercises Other Exercises Other Exercises: IS x 10 (pt with good technique) - pulling ~1000 mL    General Comments General comments (skin integrity, edema, etc.): Used incentive spirometer once somewhat settled in recliner      Pertinent Vitals/Pain Pain Assessment: Faces Faces Pain Scale: Hurts even more Pain Location: ribs, especially with movement Pain Descriptors / Indicators: Guarding;Grimacing;Moaning Pain Intervention(s): Repositioned;Other (comment) (pillow-splinted)    Home Living  Prior Function            PT Goals (current goals can now be found in the care plan section) Acute Rehab PT Goals Patient Stated Goal: to feel better PT Goal Formulation: With patient Time For Goal Achievement: 03/23/20 Potential to Achieve Goals: Fair Progress towards PT goals: Progressing toward goals    Frequency    Min 2X/week      PT Plan Current plan  remains appropriate    Co-evaluation              AM-PAC PT "6 Clicks" Mobility   Outcome Measure  Help needed turning from your back to your side while in a flat bed without using bedrails?: A Little Help needed moving from lying on your back to sitting on the side of a flat bed without using bedrails?: A Little Help needed moving to and from a bed to a chair (including a wheelchair)?: A Lot Help needed standing up from a chair using your arms (e.g., wheelchair or bedside chair)?: A Lot Help needed to walk in hospital room?: A Little Help needed climbing 3-5 steps with a railing? : A Lot 6 Click Score: 15    End of Session Equipment Utilized During Treatment: Gait belt Activity Tolerance: Patient tolerated treatment well;Patient limited by fatigue Patient left: in chair;with call bell/phone within reach;with chair alarm set Nurse Communication: Mobility status PT Visit Diagnosis: Other abnormalities of gait and mobility (R26.89);Pain;Muscle weakness (generalized) (M62.81)     Time: 1436-1500 PT Time Calculation (min) (ACUTE ONLY): 24 min  Charges:  $Gait Training: 8-22 mins $Therapeutic Activity: 8-22 mins                     Van Clines, PT  Acute Rehabilitation Services Pager [phone] Office [phone]    Levi Aland 03/21/2020, 6:03 PM

## 2020-03-22 ENCOUNTER — Inpatient Hospital Stay (HOSPITAL_COMMUNITY): Payer: Medicare Other

## 2020-03-22 DIAGNOSIS — S2241XA Multiple fractures of ribs, right side, initial encounter for closed fracture: Secondary | ICD-10-CM | POA: Diagnosis not present

## 2020-03-22 HISTORY — PX: IR THORACENTESIS ASP PLEURAL SPACE W/IMG GUIDE: IMG5380

## 2020-03-22 LAB — BASIC METABOLIC PANEL
Anion gap: 10 (ref 5–15)
BUN: 17 mg/dL (ref 8–23)
CO2: 23 mmol/L (ref 22–32)
Calcium: 8.1 mg/dL — ABNORMAL LOW (ref 8.9–10.3)
Chloride: 105 mmol/L (ref 98–111)
Creatinine, Ser: 0.67 mg/dL (ref 0.44–1.00)
GFR, Estimated: >60 mL/min (ref >60)
Glucose, Bld: 117 mg/dL — ABNORMAL HIGH (ref 70–99)
Potassium: 3.7 mmol/L (ref 3.5–5.1)
Sodium: 138 mmol/L (ref 135–145)

## 2020-03-22 LAB — CBC
HCT: 27.9 % — ABNORMAL LOW (ref 36.0–46.0)
Hemoglobin: 9.3 g/dL — ABNORMAL LOW (ref 12.0–15.0)
MCH: 33.2 pg (ref 26.0–34.0)
MCHC: 33.3 g/dL (ref 30.0–36.0)
MCV: 99.6 fL (ref 80.0–100.0)
Platelets: 302 10*3/uL (ref 150–400)
RBC: 2.8 MIL/uL — ABNORMAL LOW (ref 3.87–5.11)
RDW: 15.8 % — ABNORMAL HIGH (ref 11.5–15.5)
WBC: 10 10*3/uL (ref 4.0–10.5)
nRBC: 0 % (ref 0.0–0.2)

## 2020-03-22 LAB — BODY FLUID CELL COUNT WITH DIFFERENTIAL
Eos, Fluid: 3 %
Lymphs, Fluid: 88 %
Monocyte-Macrophage-Serous Fluid: 2 % — ABNORMAL LOW (ref 50–90)
Neutrophil Count, Fluid: 7 % (ref 0–25)
Total Nucleated Cell Count, Fluid: 3500 cu mm — ABNORMAL HIGH (ref 0–1000)

## 2020-03-22 LAB — GLUCOSE, PLEURAL OR PERITONEAL FLUID: Glucose, Fluid: 109 mg/dL

## 2020-03-22 LAB — PROTEIN, PLEURAL OR PERITONEAL FLUID: Total protein, fluid: 3.1 g/dL

## 2020-03-22 LAB — HEMOGLOBIN AND HEMATOCRIT, BLOOD
HCT: 32.1 % — ABNORMAL LOW (ref 36.0–46.0)
Hemoglobin: 10.1 g/dL — ABNORMAL LOW (ref 12.0–15.0)

## 2020-03-22 LAB — ALBUMIN, PLEURAL OR PERITONEAL FLUID: Albumin, Fluid: 1.7 g/dL

## 2020-03-22 MED ORDER — LIDOCAINE HCL 1 % IJ SOLN
INTRAMUSCULAR | Status: AC
  Filled 2020-03-22: qty 20

## 2020-03-22 NOTE — Plan of Care (Signed)
  Problem: Safety: Goal: Ability to remain free from injury will improve Outcome: Progressing   

## 2020-03-22 NOTE — Procedures (Addendum)
PROCEDURE SUMMARY:  Successful image-guided right thoracentesis. Yielded 10 milliliters of dark red fluid. Procedure was stopped after 10 mL, as pleural effusion is a hemothorax, and catheter was clotted by drainage. Patient tolerated procedure well. No immediate complications. EBL < 1 mL.  Specimen was sent for labs. CXR ordered.  Please see imaging section of Epic for full dictation.   Sherlyn Ebbert PA-C 03/22/2020 1:30 PM

## 2020-03-22 NOTE — Consult Note (Signed)
NAME:  Jordan Horne, MRN:  [id number], DOB:  1932-04-22, LOS: 14 ADMISSION DATE:  03/07/2020, CONSULTATION DATE: 03/22/20 REFERRING MD:  Fran Lowes, DO CHIEF COMPLAINT:  Pain on right chest  Brief History:  86 year old s/p fall with right rib fractures whom we are consulted 03/22/20 for R pleural effusion reportedly consistent with hemothorax on earlier same day  Thoracentesis.  History of Present Illness:  Unable to obtain reliable history from patient. Does not recall events earlier that led to admission. Multiple hospital notes and images reviewed. Per review of EMR had fall 03/07/20. Reportedly fell down 3 steps. Has been in hospital for 2 weeks. Had chest pain yesterday. On room air, satting well. This prompted CTA aortic dissection protocol 03/21/20. Notably admission CXR without effusion. On my review and interpretation CTA reveals L atelectasis is dependent, moderate right pleural effusion with what appears to be heterogeneous density with RLL collapse.  IR consulted for thoracentesis. This was consistent with hemothorax per op report. This prompted consultation to PCCM.   Past Medical History:  Dementia Hypothyroid   Significant Hospital Events:  Admitted 12/14 CTA 12/28 with R pleural effusion CXR 12/29 R pleural effusion bilateral atelectasis Thora 12.29 reportedly hemothroax  Consults:  PCCM  Procedures:  Thora 12/29  Significant Diagnostic Tests:  CTA 12/28 Thora 12/29 exudative via protein  Micro Data:  Pleural fluid culture pending 12/29  Antimicrobials:  CTX  Interim History / Subjective:  As above  Objective   Blood pressure 115/63, pulse 87, temperature 98.2 F (36.8 C), resp. rate 16, height 5\' 3"  (1.6 m), weight 84.4 kg, SpO2 93 %.        Intake/Output Summary (Last 24 hours) at 03/22/2020 2007 Last data filed at 03/22/2020 1100 Gross per 24 hour  Intake 660 ml  Output 1250 ml  Net -590 ml   Filed Weights   03/10/20 0233  Weight: 84.4  kg    Examination: General: alert, in NAD Eyes: EOMI, no icterus Neck: supple, no JVP appreciated Lungs: diminished in bases, NWOB, on RA Cardiovascular: RRR, no murmurs Abdomen: ND, BS present Extremities: warm, no bruisng Neuro: alert, no weakness Psych: alert, good attention, can not recall recent events   Resolved Hospital Problem list   n/a  Assessment & Plan:  R pleural effusion consistent with hemothorax per IR note and clotted catheter. Not present on CXR in ED on admission 14 days ago. Suspect occurred 12/20-12/21 given 3 point Hgb drop. Hgb pretty stable over last week. Likely clot in pleural space. Doubt she has active ongoing bleeding. Given stable respiratory and hemodynamic status and likely large component of clot in pleural space, do not think there is role for small bore pigtail catheter at this time. --TCTS c/s in AM for large bore chest tube to evacuate inflammatory hemothorax --Repeat Hgb now --If Hgb drops or worsened respiratory status overnight this would constiture STAT consult to TCTS for large bore chest tube  Best practice (evaluated daily)  Per primary   Goals of Care:  Per primary  Labs   CBC: Recent Labs  Lab 03/17/20 0042 03/18/20 0141 03/20/20 0622 03/21/20 0929 03/22/20 0052 03/22/20 1919  WBC 8.8 9.0 9.5 14.6* 10.0  --   HGB 10.4* 10.3* 11.2* 10.4* 9.3* 10.1*  HCT 31.0* 31.3* 35.2* 30.6* 27.9* 32.1*  MCV 95.7 96.6 100.0 99.0 99.6  --   PLT 196 265 325 314 302  --     Basic Metabolic Panel: Recent Labs  Lab 03/17/20  6269 03/18/20 0141 03/20/20 0622 03/21/20 0929 03/22/20 0052  NA 138 138 138 137 138  K 4.2 3.3* 4.1 3.8 3.7  CL 108 106 104 105 105  CO2 21* 25 21* 21* 23  GLUCOSE 86 108* 89 105* 117*  BUN 14 16 15 14 17   CREATININE 0.66 0.70 0.81 0.72 0.67  CALCIUM 8.1* 8.4* 9.0 8.3* 8.1*  MG  --   --   --  1.9  --    GFR: Estimated Creatinine Clearance: 52.9 mL/min (by C-G formula based on SCr of 0.67 mg/dL). Recent  Labs  Lab 03/16/20 0106 03/16/20 0806 03/17/20 0042 03/18/20 0141 03/20/20 0622 03/21/20 0929 03/22/20 0052  PROCALCITON <0.10  --  <0.10  --   --   --   --   WBC 8.2   < > 8.8 9.0 9.5 14.6* 10.0   < > = values in this interval not displayed.    Liver Function Tests: Recent Labs  Lab 03/16/20 0106 03/16/20 0806 03/17/20 0042 03/18/20 0141 03/20/20 0622  AST 24 33 36 21 29  ALT 23 28 29 26 26   ALKPHOS 49 61 62 68 86  BILITOT 0.4 0.8 1.1 0.7 0.9  PROT 4.9* 5.4* 5.6* 5.4* 5.9*  ALBUMIN 2.5* 2.9* 2.9* 2.8* 3.0*   No results for input(s): LIPASE, AMYLASE in the last 168 hours. No results for input(s): AMMONIA in the last 168 hours.  ABG No results found for: PHART, PCO2ART, PO2ART, HCO3, TCO2, ACIDBASEDEF, O2SAT   Coagulation Profile: No results for input(s): INR, PROTIME in the last 168 hours.  Cardiac Enzymes: No results for input(s): CKTOTAL, CKMB, CKMBINDEX, TROPONINI in the last 168 hours.  HbA1C: No results found for: HGBA1C  CBG: Recent Labs  Lab 03/21/20 0825  GLUCAP 96    Review of Systems:   Unobtainable due to patient factores  Past Medical History:  She,  has a past medical history of Abnormal electrocardiogram (ECG) (EKG) (05/07/2018), CAP (community acquired pneumonia) (03/24/2018), Chest pain of uncertain etiology (09/29/2013), Dementia (HCC), Glaucoma, Hypercholesterolemia (09/29/2013), Hypertension, Hypothyroidism (09/29/2013), SBO (small bowel obstruction) (HCC) (03/24/2018), and Thyroid disease.   Surgical History:   Past Surgical History:  Procedure Laterality Date  . ABDOMINAL HYSTERECTOMY    . IR THORACENTESIS ASP PLEURAL SPACE W/IMG GUIDE  03/22/2020  . THYROID CYST EXCISION       Social History:   reports that she has never smoked. She has never used smokeless tobacco. She reports current alcohol use of about 1.0 standard drink of alcohol per week. She reports that she does not use drugs.   Family History:  Her family history includes  Arthritis in her mother; Cancer in her brother; Heart attack in her father; Heart failure in her mother.   Allergies Allergies  Allergen Reactions  . Betadine [Povidone Iodine] Itching     Home Medications  Prior to Admission medications   Medication Sig Start Date End Date Taking? Authorizing Provider  Artificial Tear Solution (SYSTANE CONTACTS OP) Place 1 drop into both eyes daily.   Yes [provider]  divalproex (DEPAKOTE) 125 MG DR tablet Take 125 mg by mouth daily. 04/27/18  Yes [provider]  levothyroxine (SYNTHROID) 75 MCG tablet Take 75 mcg by mouth daily. 07/08/19  Yes [provider]  meloxicam (MOBIC) 7.5 MG tablet Take 7.5 mg by mouth daily. 06/28/19  Yes [provider]  nitroGLYCERIN (NITROSTAT) 0.4 MG SL tablet Place 0.4 mg under the tongue every 5 (five) minutes as needed  for chest pain (max 3 doses).   Yes [provider]  pantoprazole (PROTONIX) 40 MG tablet Take 1 tablet (40 mg total) by mouth daily. 03/31/18  Yes Sheikh, Omair Latif, DO  rosuvastatin (CRESTOR) 10 MG tablet Take 10 mg by mouth daily.   Yes [provider]  nitroGLYCERIN (NITROSTAT) 0.4 MG SL tablet Place 1 tablet (0.4 mg total) under the tongue every 5 (five) minutes as needed for chest pain. Patient not taking: Reported on 03/08/2020 12/02/19 03/01/20  Revankar, Aundra Dubin, MD  valACYclovir (VALTREX) 1000 MG tablet Take 1,000 mg by mouth See admin instructions. 21 dispensed for a 7 day supply on 02/28/20 02/28/20   [provider]     Critical care time: n/a

## 2020-03-22 NOTE — TOC Progression Note (Signed)
Transition of Care [facility]) - Progression Note    Patient Details  Name: MAGIE CIAMPA MRN: [id number] Date of Birth: 06/10/1932  Transition of Care [facility]) CM/SW Contact  Jimmy Picket, [place] Phone Number: 03/22/2020, 3:35 PM  Clinical Narrative:     [facility] is able to accept pt on 03/23/20. Pt does not need a new covid test. CSW will notify pts husband.   Expected Discharge Plan: Skilled Nursing Facility Barriers to Discharge: SNF Pending bed offer,Continued Medical Work up  Expected Discharge Plan and Services Expected Discharge Plan: Skilled Nursing Facility   Discharge Planning Services: CM Consult Post Acute Care Choice: Skilled Nursing Facility Living arrangements for the past 2 months: Single Family Home                           HH Arranged: PT,OT,Nurse's Aide,Social Work [employer] Agency: [facility] Date [facility] Agency Contacted: 03/09/20 Time HH Agency Contacted: [phone] Representative spoke with at [facility] Agency: Wayne Both   Social Determinants of Health (SDOH) Interventions    Readmission Risk Interventions No flowsheet data found.  Jimmy Picket, Theresia Majors, [place] Clinical Social Worker [phone]

## 2020-03-22 NOTE — Progress Notes (Signed)
PROGRESS NOTE  JAPJI KOK TOI:[id number] DOB: [birth year] DOA: 03/07/2020 PCP: Dema Severin, NP  Brief History   Lorretta Kerce Presnellis a 86 y.o.femalewith medical history significant formild dementia, HLD, hypothyroidism who presents after a fall at home. She waswalking at home today when she fell down 3 steps at about 4:30 PM. Patient states she just tripped and fell. Patient states she hit the back of her headand landed on her right side. She is having moderate to severe pain in the back of her head and neck. Patient states she is having pain also on the right side of her ribs.She did not take anything for the pain at home.She denies any shortness of breath. No abdominal pain.She did not have LOC. No seizure activity reported. Per husband, patient has completed both Covid vaccinations x2 and had received booster last week as well.  The patient underwent thoracentesis today due to large right pleural effusion seen on CT performed on 03/21/2020. Procedure was stopped when fluid drawn out was grossly bloody. This is as per documentation provided by Elwin Mocha, PA-C which served as my only notification.  Pulmonology has been consulted.  Consultants  . Urology . Interventional Radiology . Pulmonology  Procedures  . Thoracentesis  Antibiotics   Anti-infectives (From admission, onward)   Start     Dose/Rate Route Frequency Ordered Stop   03/16/20 0800  cefTRIAXone (ROCEPHIN) 1 g in sodium chloride 0.9 % 100 mL IVPB        1 g 200 mL/hr over 30 Minutes Intravenous Every 24 hours 03/16/20 0713      .   Subjective  The patient is resting quietly. No new complaints. The patient is seen prior to her thoracentesis.  Objective   Vitals:  Vitals:   03/22/20 0559 03/22/20 1430  BP: 122/87 115/63  Pulse: 95 87  Resp: 18 16  Temp: 98.4 F (36.9 C) 98.2 F (36.8 C)  SpO2: 95% 93%   Exam:  Constitutional:  . The patient is awake, alert, and oriented x 3. No  acute distress. Respiratory:  . No increased work of breathing. . No wheezes, rales, or rhonchi . No tactile fremitus Cardiovascular:  . Regular rate and rhythm . No murmurs, ectopy, or gallups. . No lateral PMI. No thrills. . Dullness to percussion in the lower 1/3 - 1/2 of the l Abdomen:  . Abdomen is soft, non-tender, non-distended . No hernias, masses, or organomegaly . Normoactive bowel sounds.  Musculoskeletal:  . No cyanosis, clubbing, or edema Skin:  . No rashes, lesions, ulcers . palpation of skin: no induration or nodules Neurologic:  . CN 2-12 intact . Sensation all 4 extremities intact Psychiatric:  . Mental status o Mood, affect appropriate o Orientation to person, place, time  . judgment and insight appear intact  I have personally reviewed the following:   Today's Data  . Vitals, CBC, BMP  Imaging  . CTA chest . CXR  Scheduled Meds: . (feeding supplement) PROSource Plus  30 mL Oral BID BM  . acetaminophen  650 mg Oral TID  . aspirin EC  81 mg Oral Daily  . bisacodyl  10 mg Rectal Once  . Chlorhexidine Gluconate Cloth  6 each Topical Daily  . cyanocobalamin  1,000 mcg Intramuscular Weekly  . enoxaparin (LOVENOX) injection  40 mg Subcutaneous Q1200  . feeding supplement  237 mL Oral TID BM  . levothyroxine  100 mcg Oral Daily  . lidocaine  1 patch Transdermal Q24H  . lidocaine      .  pantoprazole  40 mg Oral BID AC  . QUEtiapine  50 mg Oral QHS  . rosuvastatin  10 mg Oral Daily  . senna-docusate  2 tablet Oral BID   Continuous Infusions: . cefTRIAXone (ROCEPHIN)  IV 1 g (03/22/20 1505)    Principal Problem:   Multiple fractures of ribs, right side, initial encounter for closed fracture Active Problems:   Intractable pain   Dementia (HCC)   Fall at home, initial encounter   Elevated blood pressure reading   Prolonged QT interval   Right rib fracture   LOS: 14 days   A & P  Multiple fractures rib fractures: Secondary to mechanical fall  at home, fractures of right eighth, ninth ribs with intractable pain. Lumbar spine x-ray negative for any fractures. PT evaluation recommended SNF versus home health PT with 24/7 support. Family requested SNF, social work consult placed. She is on scheduled Tylenol, Lidoderm patch & as needed p.o. oxycodone .  Hemothorax: 10cc blood obtained from attempted thoracentesis this afternoon. Stat H&H has been ordered. There is near collapsse of the right lower lobe.  I have discussed the patient with PCCM. They will consult on the patient. They have recommended following H&H and O2 saturations. CTS should be called if there is a drop in either. I have discussed this with Dr. Antionette Char who will watch for this patient's oxygen saturations and H&H tonight.  Acute metabolic encephalopathy: It does appear she did start to develop some dementia over last few months. She does appear to be with some baseline dementia, significantly confused during hospital stay, at some point lethargic, work-up significant for abnormal TSH, low B12, and UTI. Continue with Seroquel, B12 supplements and Rocephin for UTI.  COVID-19 positive: Incidental COVID-19 positive on admission. Has pleuritic chest pain from the fractures of right eighth ninth ribs.  Had vaccination x2, booster last week. Continue pain control incentive spirometry, O2 sats 98% on room air. Chest x-ray clear on 12/14. Per pharmacy, does not meet criteria for Mab infusion due to partially or fully vaccinated for COVID-19, asymptomatic with a cycle time> 32. Currently stable, O2 sats 99% on room air, asymptomatic. D-dimers is elevated, but trending down, Doppler negative for DVT. Covid isolation expired 12/25.  Hypothyroidism: TSH elevated at 14, have increased her Synthroid from 75 to 100 mcg .  We will need to recheck TSH level in 4 to 6 weeks.  Urinary retention: Hypotension, for which she has required Foley insertion. Foley catheter placed on 12/27 by urology.   Recommend leaving Foley catheter for 3 to 4 days for maximum bladder decompression and may discharge with the Foley catheter and [facility]up [facility] for voiding trial.    UTI:  Continue Rocephin.  Unfortunately urine culture is not collected prior to start of antibiotics.  She is afebrile.  Hypokalemia/hyponatremia: Resolved  Hyperlipidemia: Continue statin  I have seen and examined this patient myself. I have spent 48 minutes in her evaluation and care.  DVT prophylaxis: SCD/Lovenox  code Status: Full code Family Communication:  None present at bedside.  Plan of care discussed with patient in length and he verbalized understanding and agreed with it.  Disposition Plan: SNF  Grete Bosko, DO [facility] Direct contact: see [email or web address]  7PM-7AM contact night coverage as above 03/22/2020, 7:31 PM  LOS: 14 days

## 2020-03-23 ENCOUNTER — Encounter (HOSPITAL_COMMUNITY)
Admission: EM | Disposition: A | Discharge status: Skilled Nursing Facility | Payer: Self-pay | Source: Home / Self Care | Attending: Internal Medicine

## 2020-03-23 ENCOUNTER — Inpatient Hospital Stay (HOSPITAL_COMMUNITY): Payer: Medicare Other | Admitting: Certified Registered"

## 2020-03-23 ENCOUNTER — Encounter (HOSPITAL_COMMUNITY): Payer: Self-pay | Admitting: Internal Medicine

## 2020-03-23 ENCOUNTER — Inpatient Hospital Stay (HOSPITAL_COMMUNITY): Payer: Medicare Other

## 2020-03-23 DIAGNOSIS — J942 Hemothorax: Secondary | ICD-10-CM | POA: Diagnosis not present

## 2020-03-23 DIAGNOSIS — J9811 Atelectasis: Secondary | ICD-10-CM

## 2020-03-23 DIAGNOSIS — S2241XA Multiple fractures of ribs, right side, initial encounter for closed fracture: Secondary | ICD-10-CM | POA: Diagnosis not present

## 2020-03-23 DIAGNOSIS — S271XXA Traumatic hemothorax, initial encounter: Secondary | ICD-10-CM

## 2020-03-23 HISTORY — PX: CHEST TUBE INSERTION: SHX231

## 2020-03-23 LAB — COMPREHENSIVE METABOLIC PANEL
ALT: 33 U/L (ref 0–44)
AST: 30 U/L (ref 15–41)
Albumin: 2.5 g/dL — ABNORMAL LOW (ref 3.5–5.0)
Alkaline Phosphatase: 74 U/L (ref 38–126)
Anion gap: 9 (ref 5–15)
BUN: 12 mg/dL (ref 8–23)
CO2: 26 mmol/L (ref 22–32)
Calcium: 8.4 mg/dL — ABNORMAL LOW (ref 8.9–10.3)
Chloride: 106 mmol/L (ref 98–111)
Creatinine, Ser: 0.69 mg/dL (ref 0.44–1.00)
GFR, Estimated: >60 mL/min (ref >60)
Glucose, Bld: 92 mg/dL (ref 70–99)
Potassium: 3.7 mmol/L (ref 3.5–5.1)
Sodium: 141 mmol/L (ref 135–145)
Total Bilirubin: 0.5 mg/dL (ref 0.3–1.2)
Total Protein: 5.5 g/dL — ABNORMAL LOW (ref 6.5–8.1)

## 2020-03-23 LAB — GLUCOSE, CAPILLARY: Glucose-Capillary: 96 mg/dL (ref 70–99)

## 2020-03-23 LAB — PROTIME-INR
INR: 1.1 (ref 0.8–1.2)
Prothrombin Time: 13.8 seconds (ref 11.4–15.2)

## 2020-03-23 LAB — CBC
HCT: 30.9 % — ABNORMAL LOW (ref 36.0–46.0)
Hemoglobin: 10.3 g/dL — ABNORMAL LOW (ref 12.0–15.0)
MCH: 33.3 pg (ref 26.0–34.0)
MCHC: 33.3 g/dL (ref 30.0–36.0)
MCV: 100 fL (ref 80.0–100.0)
Platelets: 363 10*3/uL (ref 150–400)
RBC: 3.09 MIL/uL — ABNORMAL LOW (ref 3.87–5.11)
RDW: 15.8 % — ABNORMAL HIGH (ref 11.5–15.5)
WBC: 8.7 10*3/uL (ref 4.0–10.5)
nRBC: 0 % (ref 0.0–0.2)

## 2020-03-23 LAB — APTT: aPTT: 36 seconds (ref 24–36)

## 2020-03-23 SURGERY — CHEST TUBE INSERTION
Anesthesia: Monitor Anesthesia Care | Laterality: Right

## 2020-03-23 MED ORDER — LIDOCAINE HCL (PF) 1 % IJ SOLN
INTRAMUSCULAR | Status: AC
  Filled 2020-03-23: qty 30

## 2020-03-23 MED ORDER — BUPIVACAINE LIPOSOME 1.3 % IJ SUSP
20.0000 mL | INTRAMUSCULAR | Status: AC
  Filled 2020-03-23: qty 20

## 2020-03-23 MED ORDER — BUPIVACAINE HCL (PF) 0.5 % IJ SOLN
INTRAMUSCULAR | Status: AC
  Filled 2020-03-23: qty 30

## 2020-03-23 MED ORDER — BUPIVACAINE LIPOSOME 1.3 % IJ SUSP
INTRAMUSCULAR | Status: DC | PRN
  Administered 2020-03-23: 17:00:00 50 mL

## 2020-03-23 MED ORDER — BUPIVACAINE LIPOSOME 1.3 % IJ SUSP
20.0000 mL | INTRAMUSCULAR | Status: DC
  Filled 2020-03-23: qty 20

## 2020-03-23 SURGICAL SUPPLY — 38 items
ADH SKN CLS APL DERMABOND .7 (GAUZE/BANDAGES/DRESSINGS)
BLADE CLIPPER SURG (BLADE) ×1 IMPLANT
BLADE SURG 11 STRL SS (BLADE) ×1 IMPLANT
BRUSH SCRUB EZ PLAIN DRY (MISCELLANEOUS) ×2 IMPLANT
CANISTER SUCT 3000ML PPV (MISCELLANEOUS) ×1 IMPLANT
CATH THOR STR 32F SOFT 20 RADI (CATHETERS) ×1 IMPLANT
COVER SURGICAL LIGHT HANDLE (MISCELLANEOUS) ×1 IMPLANT
DERMABOND ADVANCED (GAUZE/BANDAGES/DRESSINGS)
DERMABOND ADVANCED .7 DNX12 (GAUZE/BANDAGES/DRESSINGS) ×1 IMPLANT
DRAPE C-ARM 42X72 X-RAY (DRAPES) ×1 IMPLANT
DRAPE HALF SHEET 40X57 (DRAPES) ×1 IMPLANT
DRAPE LAPAROSCOPIC ABDOMINAL (DRAPES) ×1 IMPLANT
DRAPE UNIVERSAL PACK (DRAPES) IMPLANT
GAUZE SPONGE 4X4 12PLY STRL (GAUZE/BANDAGES/DRESSINGS) ×1 IMPLANT
GLOVE ORTHO TXT STRL SZ7.5 (GLOVE) ×2 IMPLANT
GOWN STRL REUS W/ TWL LRG LVL3 (GOWN DISPOSABLE) ×2 IMPLANT
GOWN STRL REUS W/TWL LRG LVL3 (GOWN DISPOSABLE) ×4
KIT BASIN OR (CUSTOM PROCEDURE TRAY) ×1 IMPLANT
KIT PLEURX DRAIN CATH 1000ML (MISCELLANEOUS) ×1 IMPLANT
KIT PLEURX DRAIN CATH 15.5FR (DRAIN) ×1 IMPLANT
KIT TURNOVER KIT B (KITS) ×1 IMPLANT
NEEDLE HYPO 22GX1.5 SAFETY (NEEDLE) ×1 IMPLANT
NS IRRIG 1000ML POUR BTL (IV SOLUTION) ×1 IMPLANT
PACK GENERAL/GYN (CUSTOM PROCEDURE TRAY) ×1 IMPLANT
PAD ARMBOARD 7.5X6 YLW CONV (MISCELLANEOUS) ×2 IMPLANT
PENCIL BUTTON HOLSTER BLD 10FT (ELECTRODE) IMPLANT
SET DRAINAGE LINE (MISCELLANEOUS) IMPLANT
SPONGE LAP 18X18 RF (DISPOSABLE) IMPLANT
SUT ETHILON 3 0 FSL (SUTURE) ×1 IMPLANT
SUT SILK  1 MH (SUTURE) ×2
SUT SILK 1 MH (SUTURE) IMPLANT
SUT VIC AB 3-0 X1 27 (SUTURE) ×1 IMPLANT
SYR CONTROL 10ML LL (SYRINGE) ×2 IMPLANT
TAPE CLOTH SOFT 2X10 (GAUZE/BANDAGES/DRESSINGS) ×1 IMPLANT
TOWEL GREEN STERILE (TOWEL DISPOSABLE) ×2 IMPLANT
TOWEL GREEN STERILE FF (TOWEL DISPOSABLE) ×2 IMPLANT
VALVE REPLACEMENT CAP (MISCELLANEOUS) IMPLANT
WATER STERILE IRR 1000ML POUR (IV SOLUTION) ×1 IMPLANT

## 2020-03-23 NOTE — Progress Notes (Signed)
TCTS BRIEF PROGRESS NOTE  I personally had an extensive conversation with the patient's husband, 2 of her sons, and both of their spouses.  We discussed the patient's underlying chronic medical problems and functional status, her multiple falls with a recent fall that resulted in multiple rib fractures, and the development of right hemothorax.  We discussed treatment options for management of the hemothorax including surgical intervention for right video-assisted thoracoscopy for evacuation of hemothorax under general anesthesia, placement of large bore right chest tube under intravenous sedation with local anesthesia, or continued conservative management with close observation.  We discussed the risks and benefits of each approach and put them in the context of the patient's underlying clinical status and long-term prognosis.  We also had a lengthy discussion about ultimate disposition and CODE STATUS.  All questions answered.  Plan: To OR for right chest tube under MAC anesthesia  At the request of the patient's husband and family, patient is to be considered NO CODE BLUE - DO NOT RESUSCITATE  Rexene Alberts, MD 03/23/2020 1:54 PM

## 2020-03-23 NOTE — TOC Progression Note (Signed)
Transition of Care [facility]) - Progression Note    Patient Details  Name: MARQUISHA NIKOLOV MRN: [id number] Date of Birth: 1933/01/24  Transition [facility]) CM/SW Contact  Jimmy Picket, [place] Phone Number: 03/23/2020, 11:19 AM  Clinical Narrative:     CSW spoke with [facility] to inform them pt will not be able to DC today and will keep facility posted ion when pt is medically stable to DC.  Expected Discharge Plan: Skilled Nursing Facility Barriers to Discharge: SNF Pending bed offer,Continued Medical Work up  Expected Discharge Plan and Services Expected Discharge Plan: Skilled Nursing Facility   Discharge Planning Services: CM Consult Post Acute Care Choice: Skilled Nursing Facility Living arrangements for the past 2 months: Single Family Home                           HH Arranged: PT,OT,Nurse's Aide,Social Work [employer] Agency: [facility] Date [facility] Agency Contacted: 03/09/20 Time HH Agency Contacted: 1054 Representative spoke with at [facility] Agency: Wayne Both   Social Determinants of Health (SDOH) Interventions    Readmission Risk Interventions No flowsheet data found.  Jimmy Picket, Theresia Majors, [place] Clinical Social Worker [phone]

## 2020-03-23 NOTE — Op Note (Signed)
CARDIOTHORACIC SURGERY OPERATIVE NOTE  Date of Procedure:  03/23/2020  Preoperative Diagnosis: Right Hemothorax  Postoperative Diagnosis: Same  Procedure:   Right chest tube placement  Surgeon:   Salvatore Decent. Cornelius Moras, MD  Anesthesia:   local   DETAILS OF THE OPERATIVE PROCEDURE  Following full informed consent with the patient in her hospital bed a timeout procedure was performed. The right chest was prepared and draped in a sterile manner. A mixture of Exparel and 0.5% bupivacaine was utilized to anesthetize the skin and subcutaneous tissues. A small incision was made and an intercostal block performed using the Exparel/bupivacaine mixture.  A 32 French straight chest tube was placed through the incision into the pleural space. The tube was secured to the skin and connected to a closed suction collection device. The patient tolerated the procedure well. A portable CXR was ordered. There were no complications.    Salvatore Decent. Cornelius Moras, MD 03/23/2020 4:56 PM

## 2020-03-23 NOTE — Anesthesia Preprocedure Evaluation (Signed)

## 2020-03-23 NOTE — Progress Notes (Signed)
Dr. Cornelius Moras to place chest tube at bedside in patient's room on 6N. Samantha RN made aware. OR transport called to take patient back to room on 6N.

## 2020-03-23 NOTE — Progress Notes (Signed)
Verbal order given by Dr. Cornelius Moras to reinforce and change chest tube dressing. Notified MD of bloody drainage from chest tube insertion site.

## 2020-03-23 NOTE — Progress Notes (Addendum)
NAME:  Jordan Horne, MRN:  [id number], DOB:  1932-10-26, LOS: 15 ADMISSION DATE:  03/07/2020, CONSULTATION DATE: 03/23/20 REFERRING MD:  Fran Lowes, DO CHIEF COMPLAINT:  Pain on right chest  Brief History:  86 year old s/p fall with right rib fractures whom we are consulted 03/22/20 for R pleural effusion reportedly consistent with hemothorax on earlier same day  Thoracentesis.  History of Present Illness:  Unable to obtain reliable history from patient. Does not recall events earlier that led to admission. Multiple hospital notes and images reviewed. Per review of EMR had fall 03/07/20. Reportedly fell down 3 steps. Has been in hospital for 2 weeks. Had chest pain yesterday. On room air, satting well. This prompted CTA aortic dissection protocol 03/21/20. Notably admission CXR without effusion. On my review and interpretation CTA reveals L atelectasis is dependent, moderate right pleural effusion with what appears to be heterogeneous density with RLL collapse.  IR consulted for thoracentesis. This was consistent with hemothorax per op report. This prompted consultation to PCCM.   Past Medical History:  Dementia Hypothyroid   Significant Hospital Events:  Admitted 12/14 CTA 12/28 with R pleural effusion CXR 12/29 R pleural effusion bilateral atelectasis Thora 12.29 reportedly hemothroax  Consults:  PCCM  Procedures:  Thora 12/29  Significant Diagnostic Tests:  CTA 12/28 Thora 12/29 exudative via protein  Micro Data:  Pleural fluid culture pending 12/29  Antimicrobials:  CTX  Interim History / Subjective:  CVT consult called  Objective   Blood pressure 118/66, pulse 87, temperature 98.7 F (37.1 C), temperature source Oral, resp. rate 15, height 5\' 3"  (1.6 m), weight 84.4 kg, SpO2 93 %.        Intake/Output Summary (Last 24 hours) at 03/23/2020 0908 Last data filed at 03/23/2020 0500 Gross per 24 hour  Intake 440 ml  Output 825 ml  Net -385 ml   Filed  Weights   03/10/20 0233  Weight: 84.4 kg    Examination: General: dishevels confused female Eyes: perl Neck: no jvd Lungs: decreased in bases Cardiovascular:HSR Abdomen: NT +BS Extremities: warm, no bruisng Neuro: alert, confused, poor recall      Resolved Hospital Problem list   n/a  Assessment & Plan:  R pleural effusion consistent with hemothorax per IR note and clotted catheter. Not present on CXR in ED on admission 14 days ago. Suspect occurred 12/20-12/21 given 3 point Hgb drop. Hgb pretty stable over last week. Likely clot in pleural space. Doubt she has active ongoing bleeding. Given stable respiratory and hemodynamic status and likely large component of clot in pleural space, do not think there is role for small bore pigtail catheter at this time. Recent Labs    03/22/20 0052 03/22/20 1919  HGB 9.3* 10.1*   Dr. 03/24/20 of cvts called 0900 03/23/20 for consult for large bore chest tube. PCCM available as needed please call if needed. We wil sign off.   Goals of Care:  Per primary  Labs   CBC: Recent Labs  Lab 03/17/20 0042 03/18/20 0141 03/20/20 0622 03/21/20 0929 03/22/20 0052 03/22/20 1919  WBC 8.8 9.0 9.5 14.6* 10.0  --   HGB 10.4* 10.3* 11.2* 10.4* 9.3* 10.1*  HCT 31.0* 31.3* 35.2* 30.6* 27.9* 32.1*  MCV 95.7 96.6 100.0 99.0 99.6  --   PLT 196 265 325 314 302  --     Basic Metabolic Panel: Recent Labs  Lab 03/17/20 0042 03/18/20 0141 03/20/20 0622 03/21/20 0929 03/22/20 0052  NA 138 138 138 137  138  K 4.2 3.3* 4.1 3.8 3.7  CL 108 106 104 105 105  CO2 21* 25 21* 21* 23  GLUCOSE 86 108* 89 105* 117*  BUN 14 16 15 14 17   CREATININE 0.66 0.70 0.81 0.72 0.67  CALCIUM 8.1* 8.4* 9.0 8.3* 8.1*  MG  --   --   --  1.9  --    GFR: Estimated Creatinine Clearance: 52.9 mL/min (by C-G formula based on SCr of 0.67 mg/dL). Recent Labs  Lab 03/17/20 0042 03/18/20 0141 03/20/20 0622 03/21/20 0929 03/22/20 0052  PROCALCITON <0.10  --   --   --    --   WBC 8.8 9.0 9.5 14.6* 10.0    Liver Function Tests: Recent Labs  Lab 03/17/20 0042 03/18/20 0141 03/20/20 0622  AST 36 21 29  ALT 29 26 26   ALKPHOS 62 68 86  BILITOT 1.1 0.7 0.9  PROT 5.6* 5.4* 5.9*  ALBUMIN 2.9* 2.8* 3.0*   No results for input(s): LIPASE, AMYLASE in the last 168 hours. No results for input(s): AMMONIA in the last 168 hours.  ABG No results found for: PHART, PCO2ART, PO2ART, HCO3, TCO2, ACIDBASEDEF, O2SAT   Coagulation Profile: No results for input(s): INR, PROTIME in the last 168 hours.  Cardiac Enzymes: No results for input(s): CKTOTAL, CKMB, CKMBINDEX, TROPONINI in the last 168 hours.  HbA1C: No results found for: HGBA1C  CBG: Recent Labs  Lab 03/21/20 0825  GLUCAP 96   Steve Kashus Karlen ACNP Acute Care Nurse Practitioner Pulmonary/Critical Care Please consult Amion 03/23/2020, 9:08 AM

## 2020-03-23 NOTE — Progress Notes (Signed)
PROGRESS NOTE  LINDSY CERULLO ERX:[id number] DOB: April 10, 1932 DOA: 03/07/2020 PCP: Dema Severin, NP  Brief History   Jordan Horne Presnellis a 86 y.o.femalewith medical history significant formild dementia, HLD, hypothyroidism who presents after a fall at home. She waswalking at home today when she fell down 3 steps at about 4:30 PM. Patient states she just tripped and fell. Patient states she hit the back of her headand landed on her right side. She is having moderate to severe pain in the back of her head and neck. Patient states she is having pain also on the right side of her ribs.She did not take anything for the pain at home.She denies any shortness of breath. No abdominal pain.She did not have LOC. No seizure activity reported. Per husband, patient has completed both Covid vaccinations x2 and had received booster last week as well.  The patient underwent thoracentesis today due to large right pleural effusion seen on CT performed on 03/21/2020. Procedure was stopped when fluid drawn out was grossly bloody. This is as per documentation provided by Elwin Mocha, PA-C which served as my only notification.  Pulmonology has been consulted. TCTS has also been consulted. The patient will have CT placed today. She is now DNR.  Consultants  . Urology . Interventional Radiology . Pulmonology  Procedures  . Thoracentesis  Antibiotics   Anti-infectives (From admission, onward)   Start     Dose/Rate Route Frequency Ordered Stop   03/16/20 0800  cefTRIAXone (ROCEPHIN) 1 g in sodium chloride 0.9 % 100 mL IVPB  Status:  Discontinued        1 g 200 mL/hr over 30 Minutes Intravenous Every 24 hours 03/16/20 0713 03/23/20 1341      Subjective  The patient is lying in bed. She is experiencing severe pain from hemothorax and rib fractures.  Objective   Vitals:  Vitals:   03/23/20 0424 03/23/20 1516  BP: 118/66 116/65  Pulse: 87 90  Resp: 15 16  Temp: 98.7 F (37.1 C)  98.4 F (36.9 C)  SpO2: 93% 91%   Exam:  Constitutional:  . The patient is awake, alert, and oriented x 3. Moderate distress. Respiratory:  . No increased work of breathing. . No wheezes, rales, or rhonchi . No tactile fremitus Cardiovascular:  . Regular rate and rhythm . No murmurs, ectopy, or gallups. . No lateral PMI. No thrills. . Dullness to percussion in the lower 1/3 - 1/2 of the l Abdomen:  . Abdomen is soft, non-tender, non-distended . No hernias, masses, or organomegaly . Normoactive bowel sounds.  Musculoskeletal:  . No cyanosis, clubbing, or edema Skin:  . No rashes, lesions, ulcers . palpation of skin: no induration or nodules Neurologic:  . CN 2-12 intact . Sensation all 4 extremities intact Psychiatric:  . Mental status o Mood, affect appropriate o Orientation to person, place, time  . judgment and insight appear intact  I have personally reviewed the following:   Today's Data  . Vitals, CBC, BMP  Imaging  . CTA chest . CXR  Scheduled Meds: . (feeding supplement) PROSource Plus  30 mL Oral BID BM  . acetaminophen  650 mg Oral TID  . aspirin EC  81 mg Oral Daily  . bisacodyl  10 mg Rectal Once  . bupivacaine liposome  20 mL Infiltration To OR  . Chlorhexidine Gluconate Cloth  6 each Topical Daily  . cyanocobalamin  1,000 mcg Intramuscular Weekly  . feeding supplement  237 mL Oral TID BM  .  levothyroxine  100 mcg Oral Daily  . lidocaine  1 patch Transdermal Q24H  . pantoprazole  40 mg Oral BID AC  . QUEtiapine  50 mg Oral QHS  . rosuvastatin  10 mg Oral Daily  . senna-docusate  2 tablet Oral BID   Continuous Infusions:   Principal Problem:   Multiple fractures of ribs, right side, initial encounter for closed fracture Active Problems:   Intractable pain   Dementia (HCC)   Fall at home, initial encounter   Elevated blood pressure reading   Prolonged QT interval   Right rib fracture   LOS: 15 days   A & P  Multiple fractures rib  fractures: Secondary to mechanical fall at home, fractures of right eighth, ninth ribs with intractable pain. Lumbar spine x-ray negative for any fractures. PT evaluation recommended SNF versus home health PT with 24/7 support. Family requested SNF, social work consult placed. She is on scheduled Tylenol, Lidoderm patch & as needed p.o. oxycodone .  Hemothorax: 10cc blood obtained from attempted thoracentesis this afternoon. Stat H&H has been ordered. There is near collapsse of the right lower lobe.  I have discussed the patient with PCCM. They will consult on the patient. They have recommended following H&H and O2 saturations. TCTS was consulted this morning. They have evaluated the patient and discussed her with her family. She is now a DNR, and will have CT placed today. Monitor H&H.  Acute metabolic encephalopathy: It does appear she did start to develop some dementia over last few months. She does appear to be with some baseline dementia, significantly confused during hospital stay, at some point lethargic, work-up significant for abnormal TSH, low B12, and UTI. Continue with Seroquel, B12 supplements and Rocephin for UTI.  COVID-19 positive: Incidental COVID-19 positive on admission. Has pleuritic chest pain from the fractures of right eighth ninth ribs.  Had vaccination x2, booster last week. Continue pain control incentive spirometry, O2 sats 98% on room air. Chest x-ray clear on 12/14. Per pharmacy, does not meet criteria for Mab infusion due to partially or fully vaccinated for COVID-19, asymptomatic with a cycle time> 32. Currently stable, O2 sats 99% on room air, asymptomatic. D-dimers is elevated, but trending down, Doppler negative for DVT. Covid isolation expired 12/25.  Hypothyroidism: TSH elevated at 14, have increased her Synthroid from 75 to 100 mcg .  We will need to recheck TSH level in 4 to 6 weeks.  Urinary retention: Hypotension, for which she has required Foley insertion. Foley  catheter placed on 12/27 by urology.  Recommend leaving Foley catheter for 3 to 4 days for maximum bladder decompression and may discharge with the Foley catheter and follow[facility]up [facility] for voiding trial.    UTI:  Continue Rocephin.  Unfortunately urine culture is not collected prior to start of antibiotics.  She is afebrile.  Hypokalemia/hyponatremia: Resolved  Hyperlipidemia: Continue statin  I have seen and examined this patient myself. I have spent 38 minutes in her evaluation and care.  DVT prophylaxis: SCD/Lovenox  code Status: Full code Family Communication:  None present at bedside.  Plan of care discussed with patient in length and he verbalized understanding and agreed with it.  Disposition Plan: SNF  Jacilyn Sanpedro, DO [facility] Direct contact: see [email or web address]  7PM-7AM contact night coverage as above 03/23/2020, 6:04 PM  LOS: 14 days

## 2020-03-23 NOTE — Consult Note (Addendum)
Reason for Consult:hemothorax Referring Physician: critical care medicine  Jordan Horne is an 86 y.o. female.  HPI: We are asked to see this patient in cardiothoracic surgical consultation for hemothorax.  The patient is an 86 year old female who suffered a fall with multiple right-sided fractured ribs.  Reportedly, she fell down 3 steps.  She reports that she has had multiple recent falls.  She was admitted on 03/07/2020 to the hospitalist service for further management including pain control.  Her primary complaints have been of back pain.  She does have a history of dementia, hyperlipidemia, and hypothyroidism.  Lumbar spine x-ray is negative for fracture.  She has reportedly  had some variations in her dementia with component of hospital delirium during hospitalization.  She has also had a acute urinary retention requiring urology consultation and Foley placement.  Interventional radiology was consulted and she underwent a attempted thoracentesis on 03/22/2020 which yielded 10 mL of dark red fluid.  The procedure was discontinued after 10 mL as there was clot in the catheter.  Pulmonary medicine was subsequently consulted.  She has been on DVT prophylaxis of Lovenox during the hospitalization.  Her hemoglobin and hematocrit have stabilized and is not felt as though there is ongoing bleeding.  They feel as though a small pigtail catheter would not be acceptable in terms of drainage and we have been consulted for consideration of further opinion to include possible placement of a large bore chest tube versus potential need for surgical intervention.  A CT of the chest has been obtained on 03/21/2020 and will be reviewed by the surgeon.  Past Medical History:  Diagnosis Date  . Abnormal electrocardiogram (ECG) (EKG) 05/07/2018  . CAP (community acquired pneumonia) 03/24/2018  . Chest pain of uncertain etiology 09/29/2013  . Dementia (HCC)   . Glaucoma   . Hypercholesterolemia 09/29/2013  .  Hypertension   . Hypothyroidism 09/29/2013  . SBO (small bowel obstruction) (HCC) 03/24/2018  . Thyroid disease     Past Surgical History:  Procedure Laterality Date  . ABDOMINAL HYSTERECTOMY    . IR THORACENTESIS ASP PLEURAL SPACE W/IMG GUIDE  03/22/2020  . THYROID CYST EXCISION      Family History  Problem Relation Age of Onset  . Arthritis Mother   . Heart failure Mother   . Heart attack Father   . Cancer Brother     Social History:  reports that she has never smoked. She has never used smokeless tobacco. She reports current alcohol use of about 1.0 standard drink of alcohol per week. She reports that she does not use drugs.  Allergies:  Allergies  Allergen Reactions  . Betadine [Povidone Iodine] Itching    Current Facility-Administered Medications:  .  (feeding supplement) PROSource Plus liquid 30 mL, 30 mL, Oral, BID BM, Elgergawy, Leana Roe, MD, 30 mL at 03/22/20 1450 .  acetaminophen (TYLENOL) tablet 650 mg, 650 mg, Oral, Q6H PRN, 650 mg at 03/16/20 0601 **OR** acetaminophen (TYLENOL) suppository 650 mg, 650 mg, Rectal, Q6H PRN, Chotiner, Claudean Severance, MD .  acetaminophen (TYLENOL) tablet 650 mg, 650 mg, Oral, TID, Rai, Ripudeep K, MD, 650 mg at 03/22/20 2239 .  aspirin EC tablet 81 mg, 81 mg, Oral, Daily, Elgergawy, Leana Roe, MD, 81 mg at 03/22/20 1042 .  bisacodyl (DULCOLAX) suppository 10 mg, 10 mg, Rectal, Once, Elgergawy, Dawood S, MD .  cefTRIAXone (ROCEPHIN) 1 g in sodium chloride 0.9 % 100 mL IVPB, 1 g, Intravenous, Q24H, Elgergawy, Leana Roe, MD, Last Rate: 200  mL/hr at 03/22/20 1505, 1 g at 03/22/20 1505 .  Chlorhexidine Gluconate Cloth 2 % PADS 6 each, 6 each, Topical, Daily, Elgergawy, Leana Roe, MD, 6 each at 03/21/20 [phone] .  [COMPLETED] cyanocobalamin ((VITAMIN B-12)) injection 1,000 mcg, 1,000 mcg, Intramuscular, Daily, 1,000 mcg at 03/18/20 0834 **FOLLOWED BY** cyanocobalamin ((VITAMIN B-12)) injection 1,000 mcg, 1,000 mcg, Intramuscular, Weekly, Elgergawy, Leana Roe, MD, 1,000 mcg at 03/19/20 0981 .  feeding supplement (ENSURE ENLIVE / ENSURE PLUS) liquid 237 mL, 237 mL, Oral, TID BM, Elgergawy, Leana Roe, MD, 237 mL at 03/22/20 2207 .  levothyroxine (SYNTHROID) tablet 100 mcg, 100 mcg, Oral, Daily, Elgergawy, Leana Roe, MD, 100 mcg at 03/23/20 0455 .  lidocaine (LIDODERM) 5 % 1 patch, 1 patch, Transdermal, Q24H, Rai, Ripudeep K, MD, 1 patch at 03/22/20 1448 .  morphine 2 MG/ML injection 2 mg, 2 mg, Intravenous, Q4H PRN, Pahwani, Rinka R, MD, 2 mg at 03/23/20 0455 .  oxyCODONE (Oxy IR/ROXICODONE) immediate release tablet 2.5 mg, 2.5 mg, Oral, Q6H PRN, Elgergawy, Leana Roe, MD, 2.5 mg at 03/21/20 0153 .  pantoprazole (PROTONIX) EC tablet 40 mg, 40 mg, Oral, BID AC, Elgergawy, Leana Roe, MD, 40 mg at 03/22/20 1700 .  prochlorperazine (COMPAZINE) injection 10 mg, 10 mg, Intravenous, Q4H PRN, Pahwani, Rinka R, MD, 10 mg at 03/21/20 0833 .  QUEtiapine (SEROQUEL) tablet 50 mg, 50 mg, Oral, QHS, Elgergawy, Leana Roe, MD, 50 mg at 03/22/20 2207 .  rosuvastatin (CRESTOR) tablet 10 mg, 10 mg, Oral, Daily, Chotiner, Claudean Severance, MD, 10 mg at 03/22/20 1042 .  senna-docusate (Senokot-S) tablet 2 tablet, 2 tablet, Oral, BID, Elgergawy, Leana Roe, MD, 2 tablet at 03/22/20 2207 Medications: I have reviewed the patient's current medications.  Results for orders placed or performed during the hospital encounter of 03/07/20 (from the past 48 hour(s))  CBC     Status: Abnormal   Collection Time: 03/21/20  9:29 AM  Result Value Ref Range   WBC 14.6 (H) 4.0 - 10.5 K/uL   RBC 3.09 (L) 3.87 - 5.11 MIL/uL   Hemoglobin 10.4 (L) 12.0 - 15.0 g/dL   HCT 19.1 (L) 47.8 - 29.5 %   MCV 99.0 80.0 - 100.0 fL   MCH 33.7 26.0 - 34.0 pg   MCHC 34.0 30.0 - 36.0 g/dL   RDW 62.1 (H) 30.8 - 65.7 %   Platelets 314 150 - 400 K/uL   nRBC 0.0 0.0 - 0.2 %    Comment: Performed at [facility], [address], [place], [place] [address]  Basic metabolic panel     Status: Abnormal   Collection  Time: 03/21/20  9:29 AM  Result Value Ref Range   Sodium 137 135 - 145 mmol/L   Potassium 3.8 3.5 - 5.1 mmol/L   Chloride 105 98 - 111 mmol/L   CO2 21 (L) 22 - 32 mmol/L   Glucose, Bld 105 (H) 70 - 99 mg/dL    Comment: Glucose reference range applies only to samples taken after fasting for at least 8 hours.   BUN 14 8 - 23 mg/dL   Creatinine, Ser 2.95 0.44 - 1.00 mg/dL   Calcium 8.3 (L) 8.9 - 10.3 mg/dL   GFR, Estimated >28 >41 mL/min    Comment: (NOTE) Calculated using the CKD-EPI Creatinine Equation (2021)    Anion gap 11 5 - 15    Comment: Performed at [facility], [address] [place], [place] [address]  Magnesium  Status: None   Collection Time: 03/21/20  9:29 AM  Result Value Ref Range   Magnesium 1.9 1.7 - 2.4 mg/dL    Comment: Performed at [facility], [address] [place], [place] [address]  CBC     Status: Abnormal   Collection Time: 03/22/20 12:52 AM  Result Value Ref Range   WBC 10.0 4.0 - 10.5 K/uL   RBC 2.80 (L) 3.87 - 5.11 MIL/uL   Hemoglobin 9.3 (L) 12.0 - 15.0 g/dL   HCT 60.427.9 (L) 54.036.0 - 98.146.0 %   MCV 99.6 80.0 - 100.0 fL   MCH 33.2 26.0 - 34.0 pg   MCHC 33.3 30.0 - 36.0 g/dL   RDW 19.115.8 (H) 47.811.5 - 29.515.5 %   Platelets 302 150 - 400 K/uL   nRBC 0.0 0.0 - 0.2 %    Comment: Performed at [facility], [address] [place], [place] [address]  Basic metabolic panel     Status: Abnormal   Collection Time: 03/22/20 12:52 AM  Result Value Ref Range   Sodium 138 135 - 145 mmol/L   Potassium 3.7 3.5 - 5.1 mmol/L   Chloride 105 98 - 111 mmol/L   CO2 23 22 - 32 mmol/L   Glucose, Bld 117 (H) 70 - 99 mg/dL    Comment: Glucose reference range applies only to samples taken after fasting for at least 8 hours.   BUN 17 8 - 23 mg/dL   Creatinine, Ser 8.650.67 0.44 - 1.00 mg/dL   Calcium 8.1 (L) 8.9 - 10.3 mg/dL   GFR, Estimated >78>60 >46>60 mL/min    Comment: (NOTE) Calculated using the CKD-EPI Creatinine Equation (2021)    Anion gap 10 5 - 15     Comment: Performed at [facility], [address] [place], [place] [address]  Body fluid cell count with differential     Status: Abnormal   Collection Time: 03/22/20  1:33 PM  Result Value Ref Range   Fluid Type-FCT PLEU RIGHT     Comment: CORRECTED ON 12/29 AT 1434: PREVIOUSLY REPORTED AS CYTO PLEU   Color, Fluid RED    Appearance, Fluid TURBID (A) CLEAR   Total Nucleated Cell Count, Fluid 3,500 (H) 0 - 1,000 cu mm   Neutrophil Count, Fluid 7 0 - 25 %   Lymphs, Fluid 88 %   Monocyte-Macrophage-Serous Fluid 2 (L) 50 - 90 %   Eos, Fluid 3 %   Other Cells, Fluid  MESOTHELIAL CELLS PRESENT  %    Comment: Performed at [facility]  Lab, [address] [place], [place] [address]  Body fluid culture     Status: None (Preliminary result)   Collection Time: 03/22/20  1:33 PM   Specimen: PATH Cytology Pleural fluid  Result Value Ref Range   Specimen Description PLEURAL FLUID    Special Requests NONE    Gram Stain      RARE WBC PRESENT, PREDOMINANTLY MONONUCLEAR NO ORGANISMS SEEN    Culture      NO GROWTH < 24 HOURS Performed at [facility], [address] [place], [place] [address]    Report Status PENDING   Albumin, pleural or peritoneal fluid     Status: None   Collection Time: 03/22/20  1:33 PM  Result Value Ref Range   Albumin, Fluid 1.7 g/dL    Comment: (NOTE) No normal range established for this test Results should be evaluated in conjunction with serum values  Fluid Type-FALB PLEU RIGHT     Comment: Performed at [facility], [address], [place], [place] [address] CORRECTED ON 12/29 AT 1434: PREVIOUSLY REPORTED AS CYTO PLEU   Protein, pleural or peritoneal fluid     Status: None   Collection Time: 03/22/20  1:33 PM  Result Value Ref Range   Total protein, fluid 3.1 g/dL    Comment: (NOTE) No normal range established for this test Results should be evaluated in conjunction with serum values    Fluid Type-FTP PLEU RIGHT     Comment:  Performed at [facility], [address] [place], [place] [address] CORRECTED ON 12/29 AT 1434: PREVIOUSLY REPORTED AS CYTO PLEU   Glucose, pleural or peritoneal fluid     Status: None   Collection Time: 03/22/20  1:33 PM  Result Value Ref Range   Glucose, Fluid 109 mg/dL    Comment: (NOTE) No normal range established for this test Results should be evaluated in conjunction with serum values    Fluid Type-FGLU PLEU RIGHT     Comment: Performed at [facility], [address] [place], [place] [address] CORRECTED ON 12/29 AT 1434: PREVIOUSLY REPORTED AS CYTO PLEU   Hemoglobin and hematocrit, blood     Status: Abnormal   Collection Time: 03/22/20  7:19 PM  Result Value Ref Range   Hemoglobin 10.1 (L) 12.0 - 15.0 g/dL   HCT 26.2 (L) 03.5 - 59.7 %    Comment: Performed at [facility], [address], [place], [place] [address]    DG Chest 1 View  Result Date: 03/22/2020 CLINICAL DATA:  Status post right thoracentesis. EXAM: CHEST  1 VIEW COMPARISON:  March 07, 2020. FINDINGS: Stable cardiomegaly. No pneumothorax is noted. Increased right basilar atelectasis or infiltrate is noted with associated pleural effusion. Mild left basilar subsegmental atelectasis is noted. Bony thorax is unremarkable. IMPRESSION: Increased right basilar atelectasis or infiltrate is noted with associated pleural effusion. No pneumothorax is noted. Mild left basilar subsegmental atelectasis is noted. Aortic Atherosclerosis (ICD10-I70.0). Electronically Signed   By: Lupita Raider M.D.   On: 03/22/2020 13:51   CT ANGIO CHEST AORTA W/CM & OR WO/CM  Result Date: 03/21/2020 CLINICAL DATA:  Chest pain.  Aortic dissection suspected. EXAM: CT ANGIOGRAPHY CHEST WITH CONTRAST TECHNIQUE: Multidetector CT imaging of the chest was performed using the standard protocol during bolus administration of intravenous contrast. Multiplanar CT image reconstructions and MIPs were obtained to evaluate the vascular  anatomy. CONTRAST:  23mL OMNIPAQUE IOHEXOL 350 MG/ML SOLN COMPARISON:  Chest and rib radiographs 03/07/2020 FINDINGS: Cardiovascular: Heart is mildly enlarged. Artery calcifications present. No significant pericardial effusion is present. Pulmonary arteries are within normal limits. Atherosclerotic calcifications are present at the aortic arch and at the wall the descending thoracic aorta no dissection is present. No aneurysm or focal stenosis is present. Calcifications are present the origins the great vessels, celiac artery, and the superior mesenteric artery without significant stenosis. Mediastinum/Nodes: No significant mediastinal, hilar, or axillary adenopathy is present. Esophagus is within limits. The thoracic inlet is unremarkable. Lungs/Pleura: Large right pleural effusion is present. Much of the right lower lobe is collapsed. Significant left lower lobe and posterior lingular airspace collapse present as well. Airways are patent. Dependent atelectasis is present along the left major fissure. Upper Abdomen: Prominent hiatal hernia is present. Surgical clips are present at gallbladder fossa. Visualized upper abdomen is otherwise unremarkable. Musculoskeletal: Right-sided rib fractures are present in the sixth  through tenth rib. Displacement is greatest at the ninth rib. This is adjacent to the fusion. No significant left-sided rib fractures are present. Vertebral body heights are maintained. Exaggerated thoracic kyphosis is present. Sternum is intact. Review of the MIP images confirms the above findings. IMPRESSION: 1. Right-sided rib fractures in the sixth through tenth rib. Displacement is greatest at the ninth rib. 2. Large right pleural effusion with near complete collapse of the right lower lobe. 3. Significant left lower lobe and posterior lingular airspace collapse as well. 4. Mild cardiomegaly without failure. 5. Prominent hiatal hernia. 6. Coronary artery disease. 7. Aortic Atherosclerosis  (ICD10-I70.0). Electronically Signed   By: Marin Roberts M.D.   On: 03/21/2020 12:12   IR THORACENTESIS ASP PLEURAL SPACE W/IMG GUIDE  Result Date: 03/22/2020 INDICATION: Patient with history of recent fall with multiple right rib fractures, dyspnea, and right pleural effusion. Request made for diagnostic and therapeutic right thoracentesis. EXAM: ULTRASOUND GUIDED DIAGNOSTIC AND THERAPEUTIC RIGHT THORACENTESIS MEDICATIONS: 10 mL 1% lidocaine COMPLICATIONS: None immediate. PROCEDURE: An ultrasound guided thoracentesis was thoroughly discussed with the patient and questions answered. The benefits, risks, alternatives and complications were also discussed. The patient understands and wishes to proceed with the procedure. Written consent was obtained. Ultrasound was performed to localize and mark an adequate pocket of fluid in the right chest. The area was then prepped and draped in the normal sterile fashion. 1% Lidocaine was used for local anesthesia. Under ultrasound guidance a 6 Fr Safe-T-Centesis catheter was introduced. Thoracentesis was performed. The catheter was removed and a dressing applied. FINDINGS: A total of approximately 10 mL of dark red fluid was removed. Procedure was stopped after 10 mL as pleural effusion is a hemothorax and catheter was clotted by drainage. Samples were sent to the laboratory as requested by the clinical team. IMPRESSION: Successful ultrasound guided right thoracentesis yielding 10 mL of pleural fluid. Read by: Elwin Mocha, PA-C Electronically Signed   By: Irish Lack M.D.   On: 03/22/2020 13:56    Review of Systems  Reason unable to perform ROS: Uncertain accuracy d/t dementia- she answers yes to all quesions    Blood pressure 118/66, pulse 87, temperature 98.7 F (37.1 C), temperature source Oral, resp. rate 15, height 5\' 3"  (1.6 m), weight 84.4 kg, SpO2 93 %. Physical Exam Constitutional:      General: She is not in acute distress.    Appearance:  She is not ill-appearing or toxic-appearing.  HENT:     Head: Normocephalic and atraumatic.     Nose: No rhinorrhea.     Mouth/Throat:     Mouth: Mucous membranes are moist.     Pharynx: Oropharyngeal exudate present.     Comments: Exudate on tongue- possible early thrush Eyes:     General: No scleral icterus.    Extraocular Movements: Extraocular movements intact.     Conjunctiva/sclera: Conjunctivae normal.     Pupils: Pupils are equal, round, and reactive to light.  Neck:     Vascular: No carotid bruit.  Cardiovascular:     Rate and Rhythm: Normal rate and regular rhythm.     Pulses: Normal pulses.     Heart sounds: Normal heart sounds. No murmur heard. No gallop.   Pulmonary:     Effort: Pulmonary effort is normal.     Comments: Dim mildly in the right base Abdominal:     General: Bowel sounds are normal. There is no distension.     Palpations: Abdomen is soft. There is no  mass.     Tenderness: There is no abdominal tenderness.  Musculoskeletal:        General: No swelling, tenderness or deformity.     Cervical back: Neck supple. No rigidity or tenderness.     Right lower leg: No edema.     Left lower leg: No edema.  Lymphadenopathy:     Cervical: No cervical adenopathy.  Skin:    General: Skin is warm and dry.     Capillary Refill: Capillary refill takes less than 2 seconds.     Coloration: Skin is not jaundiced or pale.     Findings: Bruising present. No erythema, lesion or rash.     Comments: Bruising from abdominal skin injections  Neurological:     Mental Status: She is alert. She is disoriented.     Motor: Weakness present.     Comments: Clearly has degree of dementia. Aware she is in hospital but confused over most of the details.   Psychiatric:        Mood and Affect: Mood normal.     Assessment/Plan: Difficult situation due to her age and dementia.  Dr. Cornelius Moras has discussed the matter with her husband and he also plans to meet with  husband and sons this  afternoon.  At this time they are not wanting any procedures to be performed and are interested in discussing the overall situation further with at least one of their sons present prior to making any definitive decisions.  We have stopped lovenox.  Rowe Clack PA-C 03/23/2020, 9:26 AM    I have personally seen and examined the patient, reviewed her chart and radiographic studies, and agree with the assessment and plan as outlined above by Gershon Crane, PA-C.  Patient is an 86 year old female with significant dementia that according to the patient's husband has progressed dramatically over the past year.  She has had multiple falls at home and presented to the hospital 2 weeks ago following another fall during which time she hit the back of her head and suffered multiple rib fractures on the right side.  Initial chest x-ray performed at the time of admission revealed minimally displaced rib fractures of the eighth and ninth ribs without evidence of pneumothorax or pleural effusion.  She was placed on Lovenox for DVT prophylaxis.  No follow-up x-rays were performed until 03/21/2020 at which time the patient underwent CT angiogram of the chest to rule out aortic dissection because of persistent significant chest pain.  CT angiogram revealed no evidence for aortic dissection but confirmed the presence of multiple rib fractures on the right side including the sixth through the 10th rib with significant displacement of the ninth rib.  There was felt to be a large right pleural effusion with near complete collapse of the right lower lobe.  The following day pulmonary critical care team was consulted and an attempt at ultrasound-guided needle thoracentesis performed.  Thoracentesis revealed a very small amount of bloody fluid consistent with likely hemothorax.  Cardiothoracic surgical consultation was requested this morning.  I have personally examined the patient and reviewed her radiographic studies.  CT  angiogram of the chest confirmed the presence of multiple rib fractures on the right side with what I would consider moderate sized hemothorax.  I discussed treatment options at the bedside with the patient who has limited capacity to understand the circumstances.  I subsequently spoke with the patient's husband over the telephone at length.  We discussed treatment options including proceeding to surgery for  right VATS for evacuation of hemothorax, placement of large bore chest tube in the OR or at the bedside with or without delayed attempt at instillation of thrombolytic therapy, versus continued conservative management without intervention.  The patient's husband initially requests that no type of interventional procedures to be performed as he specifically does not wish to see her suffer any further.  He states that he has had limited communication from other caretakers during the hospitalization.  I suggested that might be wise for he and at least 1 or 2 of his sons to come to the hospital so that we can discuss treatment options, goals of therapy, and CODE STATUS further.  We plan to meet sometime this afternoon once the family can be available.  All questions answered.     I spent in excess of 60 minutes during the conduct of this hospital encounter and >50% of this time involved direct face-to-face encounter with the patient for counseling and/or coordination of their care.     Purcell Nails, MD 03/23/2020 10:40 AM

## 2020-03-24 ENCOUNTER — Inpatient Hospital Stay (HOSPITAL_COMMUNITY): Payer: Medicare Other

## 2020-03-24 ENCOUNTER — Encounter (HOSPITAL_COMMUNITY): Payer: Self-pay | Admitting: Thoracic Surgery (Cardiothoracic Vascular Surgery)

## 2020-03-24 DIAGNOSIS — S2241XA Multiple fractures of ribs, right side, initial encounter for closed fracture: Secondary | ICD-10-CM | POA: Diagnosis not present

## 2020-03-24 DIAGNOSIS — J942 Hemothorax: Secondary | ICD-10-CM

## 2020-03-24 LAB — CBC
HCT: 32 % — ABNORMAL LOW (ref 36.0–46.0)
Hemoglobin: 10.1 g/dL — ABNORMAL LOW (ref 12.0–15.0)
MCH: 31.6 pg (ref 26.0–34.0)
MCHC: 31.6 g/dL (ref 30.0–36.0)
MCV: 100 fL (ref 80.0–100.0)
Platelets: 360 10*3/uL (ref 150–400)
RBC: 3.2 MIL/uL — ABNORMAL LOW (ref 3.87–5.11)
RDW: 15.8 % — ABNORMAL HIGH (ref 11.5–15.5)
WBC: 10.7 10*3/uL — ABNORMAL HIGH (ref 4.0–10.5)
nRBC: 0 % (ref 0.0–0.2)

## 2020-03-24 LAB — HEMOGLOBIN AND HEMATOCRIT, BLOOD
HCT: 32.7 % — ABNORMAL LOW (ref 36.0–46.0)
Hemoglobin: 10.8 g/dL — ABNORMAL LOW (ref 12.0–15.0)

## 2020-03-24 NOTE — Progress Notes (Addendum)
Physical Therapy Treatment Patient Details Name: Jordan Horne MRN: [id number] DOB: 1932/05/29 Today's Date: 03/24/2020    History of Present Illness Pt is an 86 y.o. female admitted 03/07/20 after fall down steps hitting the back of her head with no LOC. Pt sustained R 8-9th rib fxs. Incidental (+) COVID-19; pt fully vaccinated. CXR clear. PMH includes mild dementia.    PT Comments    Pt has declined in independence this date, most likely secondary to increased pain from recent R chest tube placement. Pt continues to display cognitive deficits, impacting her ability to follow single-step commands to sequence tasks. Pt required increased time for all functional mob this date. Cues provided throughout for hand and leg management/placement with pt requiring modA to roll to L, transition supine > sit L EOB, and take several small shuffling steps to the L to transfer to the recliner with bilat UE support on therapist. Pt positioned comfortably in bedside chair reclined with chair alarm on and RN notified at end of session. Will continue to follow acutely. Current recommendations remain appropriate.  Follow Up Recommendations  SNF;Supervision/Assistance - 24 hour     Equipment Recommendations  3in1 (PT);Wheelchair (measurements PT);Wheelchair cushion (measurements PT)    Recommendations for Other Services       Precautions / Restrictions Precautions Precautions: Fall Precaution Comments: R chest tube x1; Mittens; Consider getting Orthostatics next session Restrictions Weight Bearing Restrictions: No    Mobility  Bed Mobility Overal bed mobility: Needs Assistance Bed Mobility: Supine to Sit;Rolling Rolling: Mod assist (to L only)   Supine to sit: Mod assist     General bed mobility comments: Cues to reach to L bed rails to roll to L with modA to complete. Repeated cues to manage legs off EOB with min initiation ultimately requiring assistance to complete. Cues to push up to  ascend trunk, modA to complete.  Transfers Overall transfer level: Needs assistance Equipment used: 2 person hand held assist Transfers: Sit to/from [employer] Sit to Stand: Min assist Stand pivot transfers: Mod assist       General transfer comment: Multi-modal cues to initiate sit to stand from L EOB with extra time and minA under arms to power up to stand. ModA for steadying to take several small steps to L to transfer to recliner.  Ambulation/Gait Ambulation/Gait assistance: Mod assist Gait Distance (Feet): 2 Feet Assistive device: 2 person hand held assist Gait Pattern/deviations: Step-to pattern;Shuffle;Trunk flexed;Antalgic Gait velocity: Decreased Gait velocity interpretation: <1.31 ft/sec, indicative of household ambulator General Gait Details: Slow and quite painful; anxious in standing and unsettled; HHA on therapist providing modA to steady and cue pt to shift weight to step to L to recliner   Stairs             Wheelchair Mobility    Modified Rankin (Stroke Patients Only)       Balance Overall balance assessment: Needs assistance Sitting-balance support: Bilateral upper extremity supported;Feet supported Sitting balance-Leahy Scale: Poor Sitting balance - Comments: UE support and progressed from modA > min guard assist sitting statically EOB.   Standing balance support: Bilateral upper extremity supported;During functional activity;Single extremity supported Standing balance-Leahy Scale: Poor Standing balance comment: Reliant on UE support                            Cognition Arousal/Alertness: Awake/alert Behavior During Therapy: WFL for tasks assessed/performed Overall Cognitive Status: History of cognitive impairments - at baseline Area  of Impairment: Orientation;Attention;Memory;Following commands;Safety/judgement;Awareness;Problem solving                 Orientation Level: Disoriented  to;Place;Time;Situation Current Attention Level: Sustained Memory: Decreased recall of precautions;Decreased short-term memory Following Commands: Follows one step commands with increased time;Follows one step commands inconsistently Safety/Judgement: Decreased awareness of safety;Decreased awareness of deficits Awareness: Emergent Problem Solving: Slow processing;Decreased initiation;Difficulty sequencing;Requires verbal cues;Requires tactile cues General Comments: Pt sitting in bowel movement upon arrival and when questioned if she knew she had one she stated yes. Repeated multi-modal cues throughout session to keep pt attention on task and to sequence movements. Poor safety awareness.      Exercises      General Comments        Pertinent Vitals/Pain Pain Assessment: Faces Faces Pain Scale: Hurts whole lot Pain Location: ribs and back Pain Descriptors / Indicators: Guarding;Grimacing;Moaning Pain Intervention(s): Limited activity within patient's tolerance;Monitored during session;Repositioned;Patient requesting pain meds-RN notified    Home Living                      Prior Function            PT Goals (current goals can now be found in the care plan section) Acute Rehab PT Goals Patient Stated Goal: to feel better PT Goal Formulation: With patient Time For Goal Achievement: 04/07/20 Potential to Achieve Goals: Fair Progress towards PT goals: Not progressing toward goals - comment (recently had chest tube placed, pt with pain this date limiting progress)    Frequency    Min 2X/week      PT Plan Current plan remains appropriate    Co-evaluation              AM-PAC PT "6 Clicks" Mobility   Outcome Measure  Help needed turning from your back to your side while in a flat bed without using bedrails?: A Lot Help needed moving from lying on your back to sitting on the side of a flat bed without using bedrails?: A Lot Help needed moving to and from a  bed to a chair (including a wheelchair)?: A Lot Help needed standing up from a chair using your arms (e.g., wheelchair or bedside chair)?: A Little Help needed to walk in hospital room?: A Lot Help needed climbing 3-5 steps with a railing? : A Lot 6 Click Score: 13    End of Session Equipment Utilized During Treatment: Gait belt (above chest tube) Activity Tolerance: Patient limited by fatigue;Patient limited by pain Patient left: in chair;with call bell/phone within reach;with chair alarm set Nurse Communication: Mobility status;Patient requests pain meds (pt positioned in chair with alarm on and pt reclined) PT Visit Diagnosis: Other abnormalities of gait and mobility (R26.89);Pain;Muscle weakness (generalized) (M62.81);Unsteadiness on feet (R26.81);Difficulty in walking, not elsewhere classified (R26.2) Pain - Right/Left:  (ribs and back) Pain - part of body:  (ribs and back)     Time: 9518-8416 PT Time Calculation (min) (ACUTE ONLY): 28 min  Charges:  $Therapeutic Activity: 23-37 mins                     Raymond Gurney, PT, DPT Acute Rehabilitation Services  Pager: [phone] Office: [phone]    Jewel Baize 03/24/2020, 4:12 PM

## 2020-03-24 NOTE — Progress Notes (Signed)
PROGRESS NOTE  Jordan Horne WUJ:[id number] DOB: 04-09-1932 DOA: 03/07/2020 PCP: Dema Severin, NP  Brief History   Jordan Horne Jordan Horne a 86 y.o.femalewith medical history significant formild dementia, HLD, hypothyroidism who presents after a fall at home. She waswalking at home today when she fell down 3 steps at about 4:30 PM. Patient states she just tripped and fell. Patient states she hit the back of her headand landed on her right side. She is having moderate to severe pain in the back of her head and neck. Patient states she is having pain also on the right side of her ribs.She did not take anything for the pain at home.She denies any shortness of breath. No abdominal pain.She did not have LOC. No seizure activity reported. Per husband, patient has completed both Covid vaccinations x2 and had received booster last week as well.  The patient underwent thoracentesis today due to large right pleural effusion seen on CT performed on 03/21/2020. Procedure was stopped when fluid drawn out was grossly bloody. This is as per documentation provided by Elwin Mocha, PA-C which served as my only notification.  Pulmonology has been consulted. TCTS has also been consulted. CT placed on 03/24/2020. Follow up CXR demonstrates resolution of the hemothorax.  Consultants  . Urology . Interventional Radiology . Pulmonology  Procedures  . Thoracentesis  Antibiotics   Anti-infectives (From admission, onward)   Start     Dose/Rate Route Frequency Ordered Stop   03/16/20 0800  cefTRIAXone (ROCEPHIN) 1 g in sodium chloride 0.9 % 100 mL IVPB  Status:  Discontinued        1 g 200 mL/hr over 30 Minutes Intravenous Every 24 hours 03/16/20 0713 03/23/20 1341      Subjective  The patient is lying in bed. She states that she continues to have some pain, but less than before chest tube was placed.  Objective   Vitals:  Vitals:   03/24/20 0550 03/24/20 1324  BP: (!) 96/58 121/62   Pulse: 69 66  Resp: 16 15  Temp: 97.8 F (36.6 C) 97.7 F (36.5 C)  SpO2: 92% 93%   Exam:  Constitutional:  . The patient is awake, alert, and oriented x 3. Moderate distress. Respiratory:  . No increased work of breathing. . No wheezes, rales, or rhonchi . No tactile fremitus Cardiovascular:  . Regular rate and rhythm . No murmurs, ectopy, or gallups. . No lateral PMI. No thrills. . Dullness to percussion in the lower 1/3 - 1/2 of the l Abdomen:  . Abdomen is soft, non-tender, non-distended . No hernias, masses, or organomegaly . Normoactive bowel sounds.  Musculoskeletal:  . No cyanosis, clubbing, or edema Skin:  . No rashes, lesions, ulcers . palpation of skin: no induration or nodules Neurologic:  . CN 2-12 intact . Sensation all 4 extremities intact Psychiatric:  . Mental status o Mood, affect appropriate o Orientation to person, place, time  . judgment and insight appear intact  I have personally reviewed the following:   Today's Data  . Vitals, CBC  Imaging  . CTA chest . CXR  Scheduled Meds: . (feeding supplement) PROSource Plus  30 mL Oral BID BM  . acetaminophen  650 mg Oral TID  . aspirin EC  81 mg Oral Daily  . bisacodyl  10 mg Rectal Once  . Chlorhexidine Gluconate Cloth  6 each Topical Daily  . cyanocobalamin  1,000 mcg Intramuscular Weekly  . feeding supplement  237 mL Oral TID BM  . levothyroxine  100 mcg Oral Daily  . lidocaine  1 patch Transdermal Q24H  . pantoprazole  40 mg Oral BID AC  . QUEtiapine  50 mg Oral QHS  . rosuvastatin  10 mg Oral Daily  . senna-docusate  2 tablet Oral BID   Continuous Infusions:   Principal Problem:   Multiple fractures of ribs, right side, initial encounter for closed fracture Active Problems:   Intractable pain   Dementia (HCC)   Fall at home, initial encounter   Elevated blood pressure reading   Prolonged QT interval   Right rib fracture   LOS: 16 days   A & P  Multiple fractures rib  fractures: Secondary to mechanical fall at home, fractures of right eighth, ninth ribs with intractable pain. Lumbar spine x-ray negative for any fractures. PT evaluation recommended SNF versus home health PT with 24/7 support. Family requested SNF, social work consult placed. She is on scheduled Tylenol, Lidoderm patch & as needed p.o. oxycodone .  Hemothorax: 10cc blood obtained from attempted thoracentesis this afternoon. Stat H&H has been ordered. There is near collapsse of the right lower lobe.  I have discussed the patient with PCCM. They will consult on the patient. They have recommended following H&H and O2 saturations. TCTS was consulted this morning. They have evaluated the patient and discussed her with her family. Monitor H&H. CT placed on 03/24/2020. Follow up CXR demonstrates resolution of the hemothorax.  Acute metabolic encephalopathy: It does appear she did start to develop some dementia over last few months. She does appear to be with some baseline dementia, significantly confused during hospital stay, at some point lethargic, work-up significant for abnormal TSH, low B12, and UTI. Continue with Seroquel, B12 supplements and Rocephin for UTI.  COVID-19 positive: Incidental COVID-19 positive on admission. Has pleuritic chest pain from the fractures of right eighth ninth ribs.  Had vaccination x2, booster last week. Continue pain control incentive spirometry, O2 sats 98% on room air. Chest x-ray clear on 12/14. Per pharmacy, does not meet criteria for Mab infusion due to partially or fully vaccinated for COVID-19, asymptomatic with a cycle time> 32. Currently stable, O2 sats 99% on room air, asymptomatic. D-dimers is elevated, but trending down, Doppler negative for DVT. Covid isolation expired 12/25.  Hypothyroidism: TSH elevated at 14, have increased her Synthroid from 75 to 100 mcg .  We will need to recheck TSH level in 4 to 6 weeks.  Urinary retention: Hypotension, for which she  has required Foley insertion. Foley catheter placed on 12/27 by urology.  Recommend leaving Foley catheter for 3 to 4 days for maximum bladder decompression and may discharge with the Foley catheter and follow-up [facility] for voiding trial.    UTI:  Continue Rocephin.  Unfortunately urine culture is not collected prior to start of antibiotics.  She is afebrile.  Hypokalemia/hyponatremia: Resolved  Hyperlipidemia: Continue statin  I have seen and examined this patient myself. I have spent 34 minutes in her evaluation and care.  DVT prophylaxis: SCD/Lovenox  code Status: Full code Family Communication:  None present at bedside.  Plan of care discussed with patient in length and he verbalized understanding and agreed with it.  Disposition Plan: SNF  Jordan Mach, DO [facility] Direct contact: see [email or web address]  7PM-7AM contact night coverage as above 03/24/2020, 6:26 PM  LOS: 14 days

## 2020-03-24 NOTE — Progress Notes (Signed)
Patient has mittens in place, and has not attempted to pull at any equipment this evening. Currently not in wrist restraints, Will place per order if patient starts to pull at equipment. Has been sleeping most of the night only waking when in pain, and requesting pain medication.

## 2020-03-24 NOTE — Progress Notes (Signed)
      [address]       Jordan Horne [address]             [phone]     CARDIOTHORACIC SURGERY PROGRESS NOTE  1 Day Post-Op  S/P Procedure(s) (LRB): CHEST TUBE INSERTION (Right)  Subjective: Awake and alert.  Doesn't know what day it is.  Reportedly had a quiet night and slept off and on.  Patient reports decreased pain in chest.  No SOB.  Objective: Vital signs in last 24 hours: Temp:  [97.8 F (36.6 C)-98.4 F (36.9 C)] 97.8 F (36.6 C) (12/31 0550) Pulse Rate:  [69-90] 69 (12/31 0550) Resp:  [16] 16 (12/31 0550) BP: (96-116)/(58-65) 96/58 (12/31 0550) SpO2:  [91 %-93 %] 92 % (12/31 0550)  Physical Exam:  Breath sounds: Shallow but clear and symmetrical  Heart sounds:  RRR  Incisions:  Chest tube dressing dry  Abdomen:  Soft, non-distended, non-tender  Extremities:  Warm, well-perfused  Chest tubes:  Total > 700 mL old dark bloody fluid out, no sign of active bleeding, no air leak    Intake/Output from previous day: 12/30 0701 - 12/31 0700 In: 150 [P.O.:150] Out: 1781 [Urine:1150; Stool:1; Chest Tube:630] Intake/Output this shift: No intake/output data recorded.  Lab Results: Recent Labs    03/23/20 0948 03/24/20 0102  WBC 8.7 10.7*  HGB 10.3* 10.1*  HCT 30.9* 32.0*  PLT 363 360   BMET:  Recent Labs    03/22/20 0052 03/23/20 0948  NA 138 141  K 3.7 3.7  CL 105 106  CO2 23 26  GLUCOSE 117* 92  BUN 17 12  CREATININE 0.67 0.69  CALCIUM 8.1* 8.4*    CBG (last 3)  Recent Labs    03/21/20 0825 03/23/20 2034  GLUCAP 96 96   PT/INR:   Recent Labs    03/23/20 0948  LABPROT 13.8  INR 1.1    CXR:  PORTABLE CHEST 1 VIEW  COMPARISON:  03/23/2020, CT 03/21/2020  FINDINGS: Multiple acute right rib fractures are again identified. Large bore right chest tube is unchanged overlying the peripheral right lung base. Mild atelectasis or infiltrate at the right lung base is unchanged. No pneumothorax or pleural effusion. Cardiac  size within normal limits. Pulmonary vascularity is normal.  IMPRESSION: Right large bore chest tube unchanged. No pneumothorax pleural effusion.  Multiple acute right rib fractures again noted.  Mild right basilar atelectasis or infiltrate.   Electronically Signed   By: Helyn Numbers MD   On: 03/24/2020 06:17   Assessment/Plan: S/P Procedure(s) (LRB): CHEST TUBE INSERTION (Right)  Clinically stable CXR looks good w/ essentially complete evacuation of hemothorax Patient should NOT be considered a candidate for pharmacologic anticoagulation Leave chest tube in for now Mobilize as much as possible I will be out of town - my partners will cover   I spent in excess of 10 minutes during the conduct of this hospital encounter and >50% of this time involved direct face-to-face encounter with the patient for counseling and/or coordination of their care.    Purcell Nails, MD 03/24/2020 7:45 AM

## 2020-03-25 ENCOUNTER — Inpatient Hospital Stay (HOSPITAL_COMMUNITY): Payer: Medicare Other

## 2020-03-25 DIAGNOSIS — J942 Hemothorax: Secondary | ICD-10-CM

## 2020-03-25 DIAGNOSIS — S2241XA Multiple fractures of ribs, right side, initial encounter for closed fracture: Secondary | ICD-10-CM | POA: Diagnosis not present

## 2020-03-25 LAB — BASIC METABOLIC PANEL
Anion gap: 9 (ref 5–15)
BUN: 18 mg/dL (ref 8–23)
CO2: 25 mmol/L (ref 22–32)
Calcium: 8.3 mg/dL — ABNORMAL LOW (ref 8.9–10.3)
Chloride: 106 mmol/L (ref 98–111)
Creatinine, Ser: 0.72 mg/dL (ref 0.44–1.00)
GFR, Estimated: >60 mL/min (ref >60)
Glucose, Bld: 113 mg/dL — ABNORMAL HIGH (ref 70–99)
Potassium: 3.4 mmol/L — ABNORMAL LOW (ref 3.5–5.1)
Sodium: 140 mmol/L (ref 135–145)

## 2020-03-25 LAB — CBC
HCT: 31.1 % — ABNORMAL LOW (ref 36.0–46.0)
Hemoglobin: 9.9 g/dL — ABNORMAL LOW (ref 12.0–15.0)
MCH: 31.7 pg (ref 26.0–34.0)
MCHC: 31.8 g/dL (ref 30.0–36.0)
MCV: 99.7 fL (ref 80.0–100.0)
Platelets: 351 10*3/uL (ref 150–400)
RBC: 3.12 MIL/uL — ABNORMAL LOW (ref 3.87–5.11)
RDW: 15.8 % — ABNORMAL HIGH (ref 11.5–15.5)
WBC: 9.3 10*3/uL (ref 4.0–10.5)
nRBC: 0 % (ref 0.0–0.2)

## 2020-03-25 MED ORDER — POTASSIUM CHLORIDE CRYS ER 20 MEQ PO TBCR
40.0000 meq | EXTENDED_RELEASE_TABLET | Freq: Once | ORAL | Status: AC
  Administered 2020-03-25: 40 meq via ORAL
  Filled 2020-03-25 (×2): qty 2

## 2020-03-25 NOTE — Progress Notes (Signed)
PROGRESS NOTE  Jordan Horne:[id number] DOB: 30-Oct-1932 DOA: 03/07/2020 PCP: Dema Severin, NP  Brief History   Jordan Ihrig Presnellis a 87 y.o.femalewith medical history significant formild dementia, HLD, hypothyroidism who presents after a fall at home. She waswalking at home today when she fell down 3 steps at about 4:30 PM. Patient states she just tripped and fell. Patient states she hit the back of her headand landed on her right side. She is having moderate to severe pain in the back of her head and neck. Patient states she is having pain also on the right side of her ribs.She did not take anything for the pain at home.She denies any shortness of breath. No abdominal pain.She did not have LOC. No seizure activity reported. Per husband, patient has completed both Covid vaccinations x2 and had received booster last week as well.  The patient underwent thoracentesis today due to large right pleural effusion seen on CT performed on 03/21/2020. Procedure was stopped when fluid drawn out was grossly bloody. This is as per documentation provided by Elwin Mocha, PA-C which served as my only notification.  Pulmonology has been consulted. TCTS has also been consulted. CT placed on 03/24/2020. Follow up CXR demonstrates resolution of the hemothorax. TCTS plans on repeating CT chest in the am with possible removal of tube later in the day.  Consultants  . Urology . Interventional Radiology . Pulmonology . CTS  Procedures  . Thoracentesis . Chest tube placement  Antibiotics   Anti-infectives (From admission, onward)   Start     Dose/Rate Route Frequency Ordered Stop   03/16/20 0800  cefTRIAXone (ROCEPHIN) 1 g in sodium chloride 0.9 % 100 mL IVPB  Status:  Discontinued        1 g 200 mL/hr over 30 Minutes Intravenous Every 24 hours 03/16/20 0713 03/23/20 1341      Subjective  The patient is lying in bed. No new complaints.  Objective   Vitals:  Vitals:    03/25/20 0454 03/25/20 1500  BP: 119/60 113/61  Pulse: 69 86  Resp: 16 16  Temp: 98 F (36.7 C) 99.3 F (37.4 C)  SpO2: 94%    Exam:  Constitutional:  . The patient is awake, alert, and oriented x 3. Mild distress from pain from chest tube and rib fractures. Respiratory:  . No increased work of breathing. . No wheezes, rales, or rhonchi . No tactile fremitus Cardiovascular:  . Regular rate and rhythm . No murmurs, ectopy, or gallups. . No lateral PMI. No thrills. . Dullness to percussion in the lower 1/3 - 1/2 of the l Abdomen:  . Abdomen is soft, non-tender, non-distended . No hernias, masses, or organomegaly . Normoactive bowel sounds.  Musculoskeletal:  . No cyanosis, clubbing, or edema Skin:  . No rashes, lesions, ulcers . palpation of skin: no induration or nodules Neurologic:  . CN 2-12 intact . Sensation all 4 extremities intact Psychiatric:  . Mental status o Mood, affect appropriate o Orientation to person, place, time  . judgment and insight appear intact  I have personally reviewed the following:   Today's Data  . Vitals, CBC, BMP  Imaging  . CTA chest . CXR  Scheduled Meds: . (feeding supplement) PROSource Plus  30 mL Oral BID BM  . acetaminophen  650 mg Oral TID  . aspirin EC  81 mg Oral Daily  . bisacodyl  10 mg Rectal Once  . Chlorhexidine Gluconate Cloth  6 each Topical Daily  . cyanocobalamin  1,000 mcg Intramuscular Weekly  . feeding supplement  237 mL Oral TID BM  . levothyroxine  100 mcg Oral Daily  . lidocaine  1 patch Transdermal Q24H  . pantoprazole  40 mg Oral BID AC  . QUEtiapine  50 mg Oral QHS  . rosuvastatin  10 mg Oral Daily  . senna-docusate  2 tablet Oral BID   Continuous Infusions:   Principal Problem:   Multiple fractures of ribs, right side, initial encounter for closed fracture Active Problems:   Intractable pain   Dementia (HCC)   Fall at home, initial encounter   Elevated blood pressure reading   Prolonged  QT interval   Right rib fracture   LOS: 17 days   A & P  Multiple fractures rib fractures: Secondary to mechanical fall at home, fractures of right eighth, ninth ribs with intractable pain. Lumbar spine x-ray negative for any fractures. PT evaluation recommended SNF versus home health PT with 24/7 support. Family requested SNF, social work consult placed. She is on scheduled Tylenol, Lidoderm patch & as needed p.o. oxycodone .  Hemothorax: 10cc blood obtained from attempted thoracentesis this afternoon. Stat H&H has been ordered. There is near collapsse of the right lower lobe.  I have discussed the patient with PCCM. They will consult on the patient. They have recommended following H&H and O2 saturations. TCTS was consulted this morning. They have evaluated the patient and discussed her with her family. Monitor H&H. CT placed on 03/24/2020. Follow up CXR demonstrates resolution of the hemothorax. TCTS plans on repeating CT chest in the am with possible removal of tube later in the day.  Acute metabolic encephalopathy: Resolved. It does appear she did start to develop some dementia over last few months. She does appear to be with some baseline dementia, significantly confused during hospital stay, at some point lethargic, work-up significant for abnormal TSH, low B12, and UTI. Continue with Seroquel, B12 supplements and Rocephin for UTI.  COVID-19 positive: Incidental COVID-19 positive on admission. Has pleuritic chest pain from the fractures of right eighth ninth ribs.  Had vaccination x2, booster last week. Continue pain control incentive spirometry, O2 sats 98% on room air. Chest x-ray clear on 12/14. Per pharmacy, does not meet criteria for Mab infusion due to partially or fully vaccinated for COVID-19, asymptomatic with a cycle time> 32. Currently stable, O2 sats 99% on room air, asymptomatic. D-dimers is elevated, but trending down, Doppler negative for DVT. Covid isolation expired  12/25.  Hypothyroidism: TSH elevated at 14, have increased her Synthroid from 75 to 100 mcg .  We will need to recheck TSH level in 4 to 6 weeks.  Urinary retention: Hypotension, for which she has required Foley insertion. Foley catheter placed on 12/27 by urology. Recommend leaving Foley catheter for 3 to 4 days for maximum bladder decompression and may discharge with the Foley catheter and follow[facility]up [facility] for voiding trial.    UTI:  Continue Rocephin.  Unfortunately urine culture is not collected prior to start of antibiotics.  She is afebrile.  Hypokalemia/hyponatremia: Resolved  Hyperlipidemia: Continue statin  I have seen and examined this patient myself. I have spent 32 minutes in her evaluation and care.  DVT prophylaxis: SCD/Lovenox  code Status: Full code Family Communication:  None present at bedside.  Plan of care discussed with patient in length and he verbalized understanding and agreed with it.  Disposition Plan: SNF  Keyon Liller, DO [facility] Direct contact: see [email or web address]  7PM-7AM contact night  coverage as above 03/26/2019, 6:05 PM  LOS: 14 days

## 2020-03-25 NOTE — Progress Notes (Addendum)
      [address]       [place] [address]             [phone]       2 Days Post-Op Procedure(s) (LRB): CHEST TUBE INSERTION (Right)  Subjective: Patient awake and alert this am. She has complaints of pain in her back and some right rib pain.  Objective: Vital signs in last 24 hours: Temp:  [97.7 F (36.5 C)-98 F (36.7 C)] 98 F (36.7 C) (01/01 0454) Pulse Rate:  [66-69] 69 (01/01 0454) Resp:  [15-16] 16 (01/01 0454) BP: (119-121)/(60-62) 119/60 (01/01 0454) SpO2:  [93 %-94 %] 94 % (01/01 0454)      Intake/Output from previous day: 12/31 0701 - 01/01 0700 In: 600 [P.O.:600] Out: 1090 [Urine:1050; Chest Tube:40]   Physical Exam:  Cardiovascular: RRR Pulmonary: Clear to auscultation on the left and slightly diminished right base. Extremities: Warm Wounds: Right dressing is clean and dry.   Chest Tube: to suction, no air leak  Lab Results: GYJ:EHUDJS Labs    03/24/20 0102 03/24/20 1431 03/25/20 0233  WBC 10.7*  --  9.3  HGB 10.1* 10.8* 9.9*  HCT 32.0* 32.7* 31.1*  PLT 360  --  351   BMET:  Recent Labs    03/23/20 0948 03/25/20 0233  NA 141 140  K 3.7 3.4*  CL 106 106  CO2 26 25  GLUCOSE 92 113*  BUN 12 18  CREATININE 0.69 0.72  CALCIUM 8.4* 8.3*    PT/INR:  Recent Labs    03/23/20 0948  LABPROT 13.8  INR 1.1   ABG:  INR: Will add last result for INR, ABG once components are confirmed Will add last 4 CBG results once components are confirmed  Assessment/Plan:  1. CV - SR, first degree heart block . 2.  Pulmonary - On room air. S/p 2 French right chest tube for hemothorax. Chest tube with 40 cc recorded last 12 hours. Chest tube is to suction and there is no air leak. CXR this am appears stable (no pneumothorax, multiple right rib fractures, mild right base atelectasis). As discussed with Dr. Cliffton Asters, check CT chest in am. 3. Anemia-H and H this am slightly decreased to  9.9 and 31.1. Not a candidate for Lovenox,  anticoagulation  Donielle M ZimmermanPA-C 03/25/2020,7:59 AM [phone]  Minimal CT output Will obtain CT chest prior to tube removal tomorrow  Keola Heninger Keane Scrape

## 2020-03-26 ENCOUNTER — Inpatient Hospital Stay (HOSPITAL_COMMUNITY): Payer: Medicare Other

## 2020-03-26 DIAGNOSIS — F0150 Vascular dementia without behavioral disturbance: Secondary | ICD-10-CM | POA: Diagnosis not present

## 2020-03-26 DIAGNOSIS — J942 Hemothorax: Secondary | ICD-10-CM

## 2020-03-26 DIAGNOSIS — E039 Hypothyroidism, unspecified: Secondary | ICD-10-CM

## 2020-03-26 DIAGNOSIS — Z9689 Presence of other specified functional implants: Secondary | ICD-10-CM

## 2020-03-26 DIAGNOSIS — S2241XA Multiple fractures of ribs, right side, initial encounter for closed fracture: Secondary | ICD-10-CM | POA: Diagnosis not present

## 2020-03-26 LAB — COMPREHENSIVE METABOLIC PANEL
ALT: 69 U/L — ABNORMAL HIGH (ref 0–44)
AST: 63 U/L — ABNORMAL HIGH (ref 15–41)
Albumin: 2.2 g/dL — ABNORMAL LOW (ref 3.5–5.0)
Alkaline Phosphatase: 71 U/L (ref 38–126)
Anion gap: 6 (ref 5–15)
BUN: 18 mg/dL (ref 8–23)
CO2: 24 mmol/L (ref 22–32)
Calcium: 8.1 mg/dL — ABNORMAL LOW (ref 8.9–10.3)
Chloride: 110 mmol/L (ref 98–111)
Creatinine, Ser: 0.67 mg/dL (ref 0.44–1.00)
GFR, Estimated: >60 mL/min (ref >60)
Glucose, Bld: 106 mg/dL — ABNORMAL HIGH (ref 70–99)
Potassium: 3.9 mmol/L (ref 3.5–5.1)
Sodium: 140 mmol/L (ref 135–145)
Total Bilirubin: 1.1 mg/dL (ref 0.3–1.2)
Total Protein: 5.4 g/dL — ABNORMAL LOW (ref 6.5–8.1)

## 2020-03-26 LAB — CBC WITH DIFFERENTIAL/PLATELET
Abs Immature Granulocytes: 0.04 10*3/uL (ref 0.00–0.07)
Basophils Absolute: 0 10*3/uL (ref 0.0–0.1)
Basophils Relative: 0 %
Eosinophils Absolute: 0.4 10*3/uL (ref 0.0–0.5)
Eosinophils Relative: 5 %
HCT: 30.7 % — ABNORMAL LOW (ref 36.0–46.0)
Hemoglobin: 9.8 g/dL — ABNORMAL LOW (ref 12.0–15.0)
Immature Granulocytes: 0 %
Lymphocytes Relative: 17 %
Lymphs Abs: 1.6 10*3/uL (ref 0.7–4.0)
MCH: 31.7 pg (ref 26.0–34.0)
MCHC: 31.9 g/dL (ref 30.0–36.0)
MCV: 99.4 fL (ref 80.0–100.0)
Monocytes Absolute: 0.8 10*3/uL (ref 0.1–1.0)
Monocytes Relative: 8 %
Neutro Abs: 6.8 10*3/uL (ref 1.7–7.7)
Neutrophils Relative %: 70 %
Platelets: 315 10*3/uL (ref 150–400)
RBC: 3.09 MIL/uL — ABNORMAL LOW (ref 3.87–5.11)
RDW: 15.5 % (ref 11.5–15.5)
WBC: 9.8 10*3/uL (ref 4.0–10.5)
nRBC: 0 % (ref 0.0–0.2)

## 2020-03-26 LAB — BODY FLUID CULTURE: Culture: NO GROWTH

## 2020-03-26 NOTE — Plan of Care (Signed)
  Problem: Clinical Measurements: Goal: Ability to maintain clinical measurements within normal limits will improve 03/26/2020 0328 by Carylon Perches, LPN Outcome: Progressing 03/26/2020 0057 by Carylon Perches, LPN Outcome: Progressing Goal: Diagnostic test results will improve 03/26/2020 0328 by Carylon Perches, LPN Outcome: Progressing 03/26/2020 0057 by Carylon Perches, LPN Outcome: Progressing Goal: Respiratory complications will improve 03/26/2020 0328 by Carylon Perches, LPN Outcome: Progressing 03/26/2020 0057 by Carylon Perches, LPN Outcome: Progressing   Problem: Activity: Goal: Risk for activity intolerance will decrease 03/26/2020 0328 by Carylon Perches, LPN Outcome: Progressing 03/26/2020 0057 by Carylon Perches, LPN Outcome: Progressing   Problem: Nutrition: Goal: Adequate nutrition will be maintained Outcome: Progressing

## 2020-03-26 NOTE — Progress Notes (Addendum)
      [address]       [place] [address]             [phone]       3 Days Post-Op Procedure(s) (LRB): CHEST TUBE INSERTION (Right)  Subjective: Patient just waking up this am. She is in mittens. She is oriented this am.   Objective: Vital signs in last 24 hours: Temp:  [98 F (36.7 C)-99.3 F (37.4 C)] 98 F (36.7 C) (01/02 0453) Pulse Rate:  [86-88] 86 (01/02 0453) Resp:  [16-17] 16 (01/02 0453) BP: (112-116)/(61-75) 116/75 (01/02 0453) SpO2:  [94 %] 94 % (01/02 0453)     Intake/Output from previous day: 01/01 0701 - 01/02 0700 In: -  Out: 1200 [Urine:1000; Chest Tube:200]   Physical Exam:  Cardiovascular: RRR Pulmonary: Clear to auscultation on the left and slightly diminished right base. Wounds: Right dressing is clean and dry.   Chest Tube: to suction, no air leak. Bloody drainage  Lab Results: CBC: Recent Labs    03/25/20 0233 03/26/20 0143  WBC 9.3 9.8  HGB 9.9* 9.8*  HCT 31.1* 30.7*  PLT 351 315   BMET:  Recent Labs    03/25/20 0233 03/26/20 0143  NA 140 140  K 3.4* 3.9  CL 106 110  CO2 25 24  GLUCOSE 113* 106*  BUN 18 18  CREATININE 0.72 0.67  CALCIUM 8.3* 8.1*    PT/INR:  Recent Labs    03/23/20 0948  LABPROT 13.8  INR 1.1   ABG:  INR: Will add last result for INR, ABG once components are confirmed Will add last 4 CBG results once components are confirmed  Assessment/Plan:  1. CV - SR, first degree heart block . 2.  Pulmonary - On room air. S/p 64 French right chest tube for hemothorax. Chest tube with 200 cc recorded last 12 hours but less from my mark.  Chest tube is to suction and there is no air leak. CXR this am appears stable . As discussed with Dr. Cliffton Asters, check CT;if hemothorax resolved will remove tube. 3. Anemia-H and H this am slightly decreased to  9.9 and 31.1. Not a candidate for Lovenox, anticoagulation  Donielle M ZimmermanPA-C 03/26/2020,7:39 AM [phone]  Agree with  above Awaiting CT chest prior to CT removal  Dorla Guizar O Tryston Gilliam

## 2020-03-26 NOTE — Progress Notes (Signed)
[facility]  PROGRESS NOTE  Jordan Horne CZY:[id number] DOB: 08/04/1932 DOA: 03/07/2020 PCP: Imagene Riches, NP Admit date - 03/07/2020   Admitting Physician Ripudeep Krystal Eaton, MD  Outpatient Primary MD for the patient is Imagene Riches, NP  LOS - 18 Brief Narrative   Jordan Horne is a 87 y.o. year old female with medical history significant for Progressive dementia and falls presented to the hospital after a fall over steps and was found to have traumatic rib fractures and subsequently developed a hemothorax during hospitalization based off bloody drainage of diagnostic thoracentesis on 12/29 by IR for presumed pleural effusion.  PCCM was initially consulted who recommended chest tube placement to be managed by CTS.  Underwent right chest tube placement on 12/30 for right hemothorax.  Earlier hospitalization was complicated by urinary incontinence with unsuccessful Foley attempts with coud Foley placed by urology on 12/27  Subjective  No acute complaints.  No acute events overnight.  No longer requirements this morning  A & P    Right-sided hemothorax in the setting of traumatic rib fractures status post right-sided chest tube placed on 12/30, improving.  Stable respiratory status on room air, pain seems to be better controlled, chest x-ray stable -CT surgery plans for CT chest to determine if chest tube can be removed  Urinary retention requiring coud Foley catheter placement on 12/27 by urology. -Urology recommends 3 to 5-day catheter placement for maximal bladder decompression before attempting voiding trial  Dementia, improving mental status.  Alert oriented to self and place this morning.  Not currently requiring mittens -Continue delirium precautions -Scheduled Seroquel  Elevated LFTs.  Mildly elevated in the 60s without any abdominal pain -Trend CMP, may warrant hepatitis panel further evaluation  Hypothyroidism, stable -Synthroid was increased to 100 mcg this  hospitalization due to TSH of 14 -Outpatient TSH level 4 to 6 weeks  GERD, stable -Continue Protonix  HLD, stable -Continue Crestor  COVID-19 positive on admission.  Had vaccination x2 and booster week prior to admission.  Maintain normal oxygen saturations on room air.  No evidence of pneumonia on chest x-ray.  Did not meet criteria for any medical management.  Doppler negative for DVT.  Covid isolation expired on 12/25    Family Communication  : No family at bedside  Code Status : DNR, as discussed on day of admission  Disposition Plan  :  Patient is from home. Anticipated d/c date: 2 to 3 days. Barriers to d/c or necessity for inpatient status:  Requiring chest tube for management of pneumothorax, pending CT chest for further evaluation Consults  : CT surgery, urology (now signed off)  Procedures  : Right chest tube placement 12/30  DVT Prophylaxis  : SCDs given hemothorax  MDM: The below labs and imaging reports were reviewed and summarized above.  Medication management as above.  Lab Results  Component Value Date   PLT 315 03/26/2020    Diet :  Diet Order            Diet Heart Room service appropriate? Yes; Fluid consistency: Thin  Diet effective now                  Inpatient Medications Scheduled Meds: . (feeding supplement) PROSource Plus  30 mL Oral BID BM  . acetaminophen  650 mg Oral TID  . aspirin EC  81 mg Oral Daily  . bisacodyl  10 mg Rectal Once  . Chlorhexidine Gluconate Cloth  6 each Topical Daily  .  cyanocobalamin  1,000 mcg Intramuscular Weekly  . feeding supplement  237 mL Oral TID BM  . levothyroxine  100 mcg Oral Daily  . lidocaine  1 patch Transdermal Q24H  . pantoprazole  40 mg Oral BID AC  . QUEtiapine  50 mg Oral QHS  . rosuvastatin  10 mg Oral Daily  . senna-docusate  2 tablet Oral BID   Continuous Infusions: PRN Meds:.acetaminophen **OR** acetaminophen, morphine injection, oxyCODONE, prochlorperazine  Antibiotics  :    Anti-infectives (From admission, onward)   Start     Dose/Rate Route Frequency Ordered Stop   03/16/20 0800  cefTRIAXone (ROCEPHIN) 1 g in sodium chloride 0.9 % 100 mL IVPB  Status:  Discontinued        1 g 200 mL/hr over 30 Minutes Intravenous Every 24 hours 03/16/20 0713 03/23/20 1341       Objective   Vitals:   03/25/20 1500 03/25/20 2036 03/26/20 0453 03/26/20 1508  BP: 113/61 112/70 116/75 113/62  Pulse: 86 88 86 88  Resp: 16 17 16 18   Temp: 99.3 F (37.4 C) 98.1 F (36.7 C) 98 F (36.7 C) 97.9 F (36.6 C)  TempSrc: Oral Axillary Axillary   SpO2:  94% 94% 98%  Weight:      Height:        SpO2: 98 % O2 Flow Rate (L/min): 0 L/min  Wt Readings from Last 3 Encounters:  03/10/20 84.4 kg  12/02/19 72.2 kg  09/09/19 73.5 kg     Intake/Output Summary (Last 24 hours) at 03/26/2020 2005 Last data filed at 03/26/2020 1300 Gross per 24 hour  Intake 240 ml  Output 800 ml  Net -560 ml    Physical Exam:   Awake Alert, Oriented X 3, Normal affect No new F.N deficits,  [place].AT, Normal respiratory effort on room air, chest tube in place on right chest with diminished breath sounds RRR,No Gallops,Rubs or new Murmurs,  +ve B.Sounds, Abd Soft, No tenderness, No rebound, guarding or rigidity. No Cyanosis, No new Rash or bruise     I have personally reviewed the following:   Data Reviewed:  CBC Recent Labs  Lab 03/22/20 0052 03/22/20 1919 03/23/20 0948 03/24/20 0102 03/24/20 1431 03/25/20 0233 03/26/20 0143  WBC 10.0  --  8.7 10.7*  --  9.3 9.8  HGB 9.3*   < > 10.3* 10.1* 10.8* 9.9* 9.8*  HCT 27.9*   < > 30.9* 32.0* 32.7* 31.1* 30.7*  PLT 302  --  363 360  --  351 315  MCV 99.6  --  100.0 100.0  --  99.7 99.4  MCH 33.2  --  33.3 31.6  --  31.7 31.7  MCHC 33.3  --  33.3 31.6  --  31.8 31.9  RDW 15.8*  --  15.8* 15.8*  --  15.8* 15.5  LYMPHSABS  --   --   --   --   --   --  1.6  MONOABS  --   --   --   --   --   --  0.8  EOSABS  --   --   --   --   --   --   0.4  BASOSABS  --   --   --   --   --   --  0.0   < > = values in this interval not displayed.    Chemistries  Recent Labs  Lab 03/20/20 0622 03/21/20 1610 03/22/20 0052 03/23/20 9604 03/25/20 5409  03/26/20 0143  NA 138 137 138 141 140 140  K 4.1 3.8 3.7 3.7 3.4* 3.9  CL 104 105 105 106 106 110  CO2 21* 21* 23 26 25 24   GLUCOSE 89 105* 117* 92 113* 106*  BUN 15 14 17 12 18 18   CREATININE 0.81 0.72 0.67 0.69 0.72 0.67  CALCIUM 9.0 8.3* 8.1* 8.4* 8.3* 8.1*  MG  --  1.9  --   --   --   --   AST 29  --   --  30  --  63*  ALT 26  --   --  33  --  69*  ALKPHOS 86  --   --  74  --  71  BILITOT 0.9  --   --  0.5  --  1.1   ------------------------------------------------------------------------------------------------------------------ No results for input(s): CHOL, HDL, LDLCALC, TRIG, CHOLHDL, LDLDIRECT in the last 72 hours.  No results found for: HGBA1C ------------------------------------------------------------------------------------------------------------------ No results for input(s): TSH, T4TOTAL, T3FREE, THYROIDAB in the last 72 hours.  Invalid input(s): FREET3 ------------------------------------------------------------------------------------------------------------------ No results for input(s): VITAMINB12, FOLATE, FERRITIN, TIBC, IRON, RETICCTPCT in the last 72 hours.  Coagulation profile Recent Labs  Lab 03/23/20 0948  INR 1.1    No results for input(s): DDIMER in the last 72 hours.  Cardiac Enzymes No results for input(s): CKMB, TROPONINI, MYOGLOBIN in the last 168 hours.  Invalid input(s): CK ------------------------------------------------------------------------------------------------------------------    Component Value Date/Time   BNP 106.3 (H) 03/11/2020 0005    Micro Results Recent Results (from the past 240 hour(s))  Body fluid culture     Status: None   Collection Time: 03/22/20  1:33 PM   Specimen: PATH Cytology Pleural fluid  Result  Value Ref Range Status   Specimen Description PLEURAL FLUID  Final   Special Requests NONE  Final   Gram Stain   Final    RARE WBC PRESENT, PREDOMINANTLY MONONUCLEAR NO ORGANISMS SEEN    Culture   Final    NO GROWTH 3 DAYS Performed at [facility], [address], [place], [address] [place]    Report Status 03/26/2020 FINAL  Final    Radiology Reports DG Chest 1 View  Result Date: 03/22/2020 CLINICAL DATA:  Status post right thoracentesis. EXAM: CHEST  1 VIEW COMPARISON:  March 07, 2020. FINDINGS: Stable cardiomegaly. No pneumothorax is noted. Increased right basilar atelectasis or infiltrate is noted with associated pleural effusion. Mild left basilar subsegmental atelectasis is noted. Bony thorax is unremarkable. IMPRESSION: Increased right basilar atelectasis or infiltrate is noted with associated pleural effusion. No pneumothorax is noted. Mild left basilar subsegmental atelectasis is noted. Aortic Atherosclerosis (ICD10-I70.0). Electronically Signed   By: 03/24/2020 M.D.   On: 03/22/2020 13:51   DG Ribs Unilateral W/Chest Right  Result Date: 03/07/2020 CLINICAL DATA:  Fall, right chest pain EXAM: RIGHT RIBS AND CHEST - 3+ VIEW COMPARISON:  None. FINDINGS: Single view radiograph of the chest and two view radiograph of the right ribs demonstrates acute minimally displaced fractures of the right eighth and ninth ribs posteriorly. Minimal left basilar atelectasis or infiltrate. Lungs are otherwise clear. No pneumothorax or pleural effusion. Cardiac size is at the upper limits of normal. Pulmonary vascularity is normal. IMPRESSION: Acute minimally displaced right 8, 9 rib fractures. No pneumothorax. Electronically Signed   By: 03/24/2020 MD   On: 03/07/2020 20:05   DG Thoracic Spine 2 View  Result Date: 03/07/2020 CLINICAL DATA:  Fall, back pain EXAM: THORACIC  SPINE 2 VIEWS COMPARISON:  04/24/2018 FINDINGS: Two view radiograph thoracic spine demonstrates normal thoracic  kyphosis. Mild thoracic dextroscoliosis, apex right at T5, unchanged. No acute fracture or listhesis of the thoracic spine. Vertebral body height has been preserved. There is diffuse intervertebral disc space narrowing and endplate remodeling throughout the thoracic spine in keeping with changes of diffuse severe degenerative disc disease. The paraspinal soft tissues are unremarkable. Incidental note is made of a small hiatal hernia. IMPRESSION: No acute fracture or listhesis. Electronically Signed   By: Helyn Numbers MD   On: 03/07/2020 20:02   DG Lumbar Spine Complete  Result Date: 03/07/2020 CLINICAL DATA:  Fall, back pain EXAM: LUMBAR SPINE - COMPLETE 4+ VIEW COMPARISON:  None. FINDINGS: Five view radiograph lumbar spine. Normal lumbar lordosis. No acute fracture or listhesis of the lumbar spine. Vertebral body height has been preserved. There is diffuse severe intervertebral disc space narrowing and endplate remodeling with multilevel vacuum disc phenomena in keeping with changes of diffuse severe degenerative disc disease throughout the lumbar spine. Oblique views demonstrate no evidence of pars defect. Paraspinal soft tissues are unremarkable. IMPRESSION: No acute fracture or listhesis. Electronically Signed   By: Helyn Numbers MD   On: 03/07/2020 20:06   DG Abd 1 View  Result Date: 03/15/2020 CLINICAL DATA:  Nausea and vomiting EXAM: ABDOMEN - 1 VIEW COMPARISON:  08/13/2018 FINDINGS: Bowel gas pattern is unremarkable. Cholecystectomy clips. No acute osseous abnormality. IMPRESSION: Normal bowel gas pattern. Electronically Signed   By: Guadlupe Spanish M.D.   On: 03/15/2020 10:41   CT HEAD WO CONTRAST  Result Date: 03/15/2020 CLINICAL DATA:  Delirium EXAM: CT HEAD WITHOUT CONTRAST TECHNIQUE: Contiguous axial images were obtained from the base of the skull through the vertex without intravenous contrast. COMPARISON:  03/07/2020 FINDINGS: Clinical note: Examination is mildly degraded by patient  motion. Brain: No evidence of acute infarction, hemorrhage, hydrocephalus, extra-axial collection or mass lesion/mass effect. Mild low-density changes within the periventricular and subcortical white matter compatible with chronic microvascular ischemic change. Mild diffuse cerebral volume loss. Vascular: Atherosclerotic calcifications involving the large vessels of the skull base. No unexpected hyperdense vessel. Skull: No evidence of calvarial fracture. Sinuses/Orbits: No acute finding. Other: None. IMPRESSION: 1. No acute intracranial findings. 2. Chronic microvascular ischemic change and cerebral volume loss. Electronically Signed   By: Duanne Guess D.O.   On: 03/15/2020 16:50   CT Head Wo Contrast  Result Date: 03/07/2020 CLINICAL DATA:  Head trauma EXAM: CT HEAD WITHOUT CONTRAST TECHNIQUE: Contiguous axial images were obtained from the base of the skull through the vertex without intravenous contrast. COMPARISON:  CT brain 08/12/2019 FINDINGS: Brain: No acute territorial infarction, hemorrhage, or intracranial mass. Mild atrophy. Mild chronic small vessel ischemic changes of the white matter. Chronic appearing lacunar infarcts in the basal ganglia. Stable punctate calcifications in the left periventricular white matter. Stable ventricle size. Vascular: No hyperdense vessels.  Carotid vascular calcification Skull: Normal. Negative for fracture or focal lesion. Sinuses/Orbits: Mild mucosal thickening in the sinuses Other: None IMPRESSION: 1. No CT evidence for acute intracranial abnormality. 2. Atrophy and mild chronic small vessel ischemic changes of the white matter. Electronically Signed   By: Jasmine Pang M.D.   On: 03/07/2020 20:21   CT Cervical Spine Wo Contrast  Result Date: 03/07/2020 CLINICAL DATA:  Trauma EXAM: CT CERVICAL SPINE WITHOUT CONTRAST TECHNIQUE: Multidetector CT imaging of the cervical spine was performed without intravenous contrast. Multiplanar CT image reconstructions were  also generated. COMPARISON:  08/12/2019  FINDINGS: Alignment: Stable alignment with trace retrolisthesis C5 on C6 and trace anterolisthesis C6 on C7. Facet alignment is maintained. Skull base and vertebrae: No acute fracture. No primary bone lesion or focal pathologic process. Soft tissues and spinal canal: No prevertebral fluid or swelling. No visible canal hematoma. Disc levels: Multiple level degenerative changes with moderate to marked disease at C4-C5 and C5-C6. Facet degenerative change at multiple levels. Upper chest: Negative.  No appreciable thyroid tissue identified. Other: None IMPRESSION: Stable alignment of the cervical spine with degenerative changes. No acute osseous abnormality. Electronically Signed   By: Jasmine PangKim  Fujinaga M.D.   On: 03/07/2020 20:26   DG CHEST PORT 1 VIEW  Result Date: 03/26/2020 CLINICAL DATA:  Right rib fractures and right chest tube EXAM: PORTABLE CHEST 1 VIEW COMPARISON:  Chest radiograph from one day prior. FINDINGS: Lower right chest tube has been slightly retracted in the interval with side port near the periphery of the right pleural space. Stable cardiomediastinal silhouette with top-normal heart size. No pneumothorax. Probable trace bilateral pleural effusions, stable. Patchy bibasilar lung opacities, similar. Multiple posterolateral right mid rib fractures again noted. IMPRESSION: 1. No pneumothorax. Right chest tube has been slightly retracted in the interval, with side port near the periphery of the right pleural space. 2. Probable stable trace bilateral pleural effusions. 3. Stable patchy bibasilar lung opacities. Electronically Signed   By: Delbert PhenixJason A Poff M.D.   On: 03/26/2020 08:18   DG Chest Port 1 View  Result Date: 03/25/2020 CLINICAL DATA:  87 year old female day 2 status post right chest tube placement 4 hemothorax. Right 6th through 10th rib fractures. EXAM: PORTABLE CHEST 1 VIEW COMPARISON:  Portable chest 03/24/2020 and earlier. FINDINGS: Portable AP upright  view at 0726 hours. Stable right chest tube position, side hole deep to the chest wall. Largely drained right side hemothorax demonstrated by CT on 03/21/2020. No pneumothorax identified. Lower lung volumes. Stable cardiac size and mediastinal contours. Continued patchy left lung opacity which appeared mostly related to atelectasis by CT. Multilevel right posterolateral rib fractures. Negative visible bowel gas pattern. IMPRESSION: 1. Stable right chest tube. Largely drained right side hemothorax and no pneumothorax identified. 2. Continued left lung base opacity favored due to atelectasis. 3. Right rib fractures. Electronically Signed   By: Odessa FlemingH  Hall M.D.   On: 03/25/2020 09:23   DG Chest Port 1 View  Result Date: 03/24/2020 CLINICAL DATA:  Right hemothorax EXAM: PORTABLE CHEST 1 VIEW COMPARISON:  03/23/2020, CT 03/21/2020 FINDINGS: Multiple acute right rib fractures are again identified. Large bore right chest tube is unchanged overlying the peripheral right lung base. Mild atelectasis or infiltrate at the right lung base is unchanged. No pneumothorax or pleural effusion. Cardiac size within normal limits. Pulmonary vascularity is normal. IMPRESSION: Right large bore chest tube unchanged. No pneumothorax pleural effusion. Multiple acute right rib fractures again noted. Mild right basilar atelectasis or infiltrate. Electronically Signed   By: Helyn NumbersAshesh  Parikh MD   On: 03/24/2020 06:17   DG Chest Port 1 View  Result Date: 03/23/2020 CLINICAL DATA:  Chest tube placement. EXAM: PORTABLE CHEST 1 VIEW COMPARISON:  Radiograph yesterday.  CT 03/21/2020 FINDINGS: Placement of right-sided chest tube with tip in the region of the right mid lung. No visualized pneumothorax. Small amount of subcutaneous emphysema in the right lateral chest wall. Hazy right greater than left lung base opacities with slight improvement from prior exam. Multiple displaced right rib fractures again seen. No pulmonary edema or new airspace  disease. Aortic atherosclerosis  with stable mediastinal contours. IMPRESSION: 1. Placement of right-sided chest tube with tip projecting over the right mid lung. No visualized pneumothorax. 2. Hazy right greater than left lung base opacities, improving from prior exam, likely combination of pleural fluid and atelectasis. 3. Displaced right rib fractures. Electronically Signed   By: Narda Rutherford M.D.   On: 03/23/2020 18:06   CT ANGIO CHEST AORTA W/CM & OR WO/CM  Result Date: 03/21/2020 CLINICAL DATA:  Chest pain.  Aortic dissection suspected. EXAM: CT ANGIOGRAPHY CHEST WITH CONTRAST TECHNIQUE: Multidetector CT imaging of the chest was performed using the standard protocol during bolus administration of intravenous contrast. Multiplanar CT image reconstructions and MIPs were obtained to evaluate the vascular anatomy. CONTRAST:  80mL OMNIPAQUE IOHEXOL 350 MG/ML SOLN COMPARISON:  Chest and rib radiographs 03/07/2020 FINDINGS: Cardiovascular: Heart is mildly enlarged. Artery calcifications present. No significant pericardial effusion is present. Pulmonary arteries are within normal limits. Atherosclerotic calcifications are present at the aortic arch and at the wall the descending thoracic aorta no dissection is present. No aneurysm or focal stenosis is present. Calcifications are present the origins the great vessels, celiac artery, and the superior mesenteric artery without significant stenosis. Mediastinum/Nodes: No significant mediastinal, hilar, or axillary adenopathy is present. Esophagus is within limits. The thoracic inlet is unremarkable. Lungs/Pleura: Large right pleural effusion is present. Much of the right lower lobe is collapsed. Significant left lower lobe and posterior lingular airspace collapse present as well. Airways are patent. Dependent atelectasis is present along the left major fissure. Upper Abdomen: Prominent hiatal hernia is present. Surgical clips are present at gallbladder fossa.  Visualized upper abdomen is otherwise unremarkable. Musculoskeletal: Right-sided rib fractures are present in the sixth through tenth rib. Displacement is greatest at the ninth rib. This is adjacent to the fusion. No significant left-sided rib fractures are present. Vertebral body heights are maintained. Exaggerated thoracic kyphosis is present. Sternum is intact. Review of the MIP images confirms the above findings. IMPRESSION: 1. Right-sided rib fractures in the sixth through tenth rib. Displacement is greatest at the ninth rib. 2. Large right pleural effusion with near complete collapse of the right lower lobe. 3. Significant left lower lobe and posterior lingular airspace collapse as well. 4. Mild cardiomegaly without failure. 5. Prominent hiatal hernia. 6. Coronary artery disease. 7. Aortic Atherosclerosis (ICD10-I70.0). Electronically Signed   By: Marin Roberts M.D.   On: 03/21/2020 12:12   VAS [place] LOWER EXTREMITY VENOUS (DVT)  Result Date: 03/13/2020  Lower Venous DVT Study Other Indications: Covid, Elevated D-Dimer. Comparison Study: No previous exam Performing Technologist: Clint Guy RVT  Examination Guidelines: A complete evaluation includes B-mode imaging, spectral Doppler, color Doppler, and power Doppler as needed of all accessible portions of each vessel. Bilateral testing is considered an integral part of a complete examination. Limited examinations for reoccurring indications may be performed as noted. The reflux portion of the exam is performed with the patient in reverse Trendelenburg.  +---------+---------------+---------+-----------+----------+--------------+ RIGHT    CompressibilityPhasicitySpontaneityPropertiesThrombus Aging +---------+---------------+---------+-----------+----------+--------------+ CFV      Full           Yes      Yes                                 +---------+---------------+---------+-----------+----------+--------------+ SFJ      Full                                                         +---------+---------------+---------+-----------+----------+--------------+  FV Prox  Full                                                        +---------+---------------+---------+-----------+----------+--------------+ FV Mid   Full                                                        +---------+---------------+---------+-----------+----------+--------------+ FV DistalFull                                                        +---------+---------------+---------+-----------+----------+--------------+ PFV      Full                                                        +---------+---------------+---------+-----------+----------+--------------+ POP      Full           Yes      Yes                                 +---------+---------------+---------+-----------+----------+--------------+ PTV      Full                                                        +---------+---------------+---------+-----------+----------+--------------+ PERO     Full                                                        +---------+---------------+---------+-----------+----------+--------------+   +---------+---------------+---------+-----------+----------+--------------+ LEFT     CompressibilityPhasicitySpontaneityPropertiesThrombus Aging +---------+---------------+---------+-----------+----------+--------------+ CFV      Full           Yes      Yes                                 +---------+---------------+---------+-----------+----------+--------------+ SFJ      Full                                                        +---------+---------------+---------+-----------+----------+--------------+ FV Prox  Full                                                        +---------+---------------+---------+-----------+----------+--------------+  FV Mid   Full                                                         +---------+---------------+---------+-----------+----------+--------------+ FV DistalFull                                                        +---------+---------------+---------+-----------+----------+--------------+ PFV      Full                                                        +---------+---------------+---------+-----------+----------+--------------+ POP      Full           Yes      Yes                                 +---------+---------------+---------+-----------+----------+--------------+ PTV      Full                                                        +---------+---------------+---------+-----------+----------+--------------+ PERO     Full                                                        +---------+---------------+---------+-----------+----------+--------------+     Summary: RIGHT: - There is no evidence of deep vein thrombosis in the lower extremity.  - No cystic structure found in the popliteal fossa.  LEFT: - There is no evidence of deep vein thrombosis in the lower extremity.  - No cystic structure found in the popliteal fossa.  *See table(s) above for measurements and observations. Electronically signed by Waverly Ferrari MD on 03/13/2020 at 4:35:46 PM.    Final    IR THORACENTESIS ASP PLEURAL SPACE W/IMG GUIDE  Result Date: 03/22/2020 INDICATION: Patient with history of recent fall with multiple right rib fractures, dyspnea, and right pleural effusion. Request made for diagnostic and therapeutic right thoracentesis. EXAM: ULTRASOUND GUIDED DIAGNOSTIC AND THERAPEUTIC RIGHT THORACENTESIS MEDICATIONS: 10 mL 1% lidocaine COMPLICATIONS: None immediate. PROCEDURE: An ultrasound guided thoracentesis was thoroughly discussed with the patient and questions answered. The benefits, risks, alternatives and complications were also discussed. The patient understands and wishes to proceed with the procedure. Written consent was obtained. Ultrasound was  performed to localize and mark an adequate pocket of fluid in the right chest. The area was then prepped and draped in the normal sterile fashion. 1% Lidocaine was used for local anesthesia. Under ultrasound guidance a 6 Fr Safe-T-Centesis catheter was introduced. Thoracentesis was performed. The catheter was removed and a dressing applied. FINDINGS: A total of approximately 10 mL of dark  red fluid was removed. Procedure was stopped after 10 mL as pleural effusion is a hemothorax and catheter was clotted by drainage. Samples were sent to the laboratory as requested by the clinical team. IMPRESSION: Successful ultrasound guided right thoracentesis yielding 10 mL of pleural fluid. Read by: Elwin MochaAlexandra Louk, PA-C Electronically Signed   By: Irish LackGlenn  Yamagata M.D.   On: 03/22/2020 13:56     Time Spent in minutes  30     Laverna PeaceShayla D Dulcie Gammon M.D on 03/26/2020 at 8:05 PM  To page go to [email or web address] - password [facility]

## 2020-03-27 ENCOUNTER — Inpatient Hospital Stay (HOSPITAL_COMMUNITY): Payer: Medicare Other

## 2020-03-27 DIAGNOSIS — S2241XA Multiple fractures of ribs, right side, initial encounter for closed fracture: Secondary | ICD-10-CM | POA: Diagnosis not present

## 2020-03-27 DIAGNOSIS — J942 Hemothorax: Secondary | ICD-10-CM

## 2020-03-27 LAB — COMPREHENSIVE METABOLIC PANEL
ALT: 146 U/L — ABNORMAL HIGH (ref 0–44)
AST: 141 U/L — ABNORMAL HIGH (ref 15–41)
Albumin: 2.4 g/dL — ABNORMAL LOW (ref 3.5–5.0)
Alkaline Phosphatase: 105 U/L (ref 38–126)
Anion gap: 12 (ref 5–15)
BUN: 18 mg/dL (ref 8–23)
CO2: 24 mmol/L (ref 22–32)
Calcium: 8.7 mg/dL — ABNORMAL LOW (ref 8.9–10.3)
Chloride: 104 mmol/L (ref 98–111)
Creatinine, Ser: 0.68 mg/dL (ref 0.44–1.00)
GFR, Estimated: >60 mL/min (ref >60)
Glucose, Bld: 101 mg/dL — ABNORMAL HIGH (ref 70–99)
Potassium: 3.7 mmol/L (ref 3.5–5.1)
Sodium: 140 mmol/L (ref 135–145)
Total Bilirubin: 0.6 mg/dL (ref 0.3–1.2)
Total Protein: 6.1 g/dL — ABNORMAL LOW (ref 6.5–8.1)

## 2020-03-27 LAB — PATHOLOGIST SMEAR REVIEW

## 2020-03-27 NOTE — Progress Notes (Signed)
Physical Therapy Treatment Patient Details Name: Jordan Horne MRN: [id number] DOB: [birth year] Today's Date: 03/27/2020    History of Present Illness 87 y.o. female admitted 03/07/20 after fall down steps hitting the back of her head with no LOC. Pt sustained R 8-9th rib fxs. Incidental (+) COVID-19; pt fully vaccinated. CXR clear. PMH includes mild dementia.    PT Comments    Pt was seen for there ex initially then with max encouragement did orthostatics.  Supine 121/60 and pulse 70, sitting 116/75 and pulse 84, and standing 112/76 and pulse 90.  Pt is tolerant of all but standing, and began to complain extensively about a new pain in her feet.  Follow acutely for goals of PT and encourage pt to be OOB more, to increase standing and sitting up time, and to increase balance and safety awareness for improving independence with gait.   Follow Up Recommendations  SNF     Equipment Recommendations  3in1 (PT);Wheelchair (measurements PT);Wheelchair cushion (measurements PT)    Recommendations for Other Services       Precautions / Restrictions Precautions Precautions: Fall Precaution Comments: R chest tube x1; Orthostatics Restrictions Weight Bearing Restrictions: No    Mobility  Bed Mobility Overal bed mobility: Needs Assistance Bed Mobility: Supine to Sit;Sit to Supine Rolling: Mod assist   Supine to sit: Mod assist Sit to supine: Mod assist   General bed mobility comments: began to help with getting up and then stopped  Transfers Overall transfer level: Needs assistance Equipment used: 1 person hand held assist;2 person hand held assist Transfers: Sit to/from Stand Sit to Stand: Mod assist;+2 physical assistance;+2 safety/equipment;From elevated surface         General transfer comment: pt did not follow through well with exercises or standing and required repetitive cues for completion of the tasks  Ambulation/Gait             General Gait Details:  declines   Stairs             Wheelchair Mobility    Modified Rankin (Stroke Patients Only)       Balance Overall balance assessment: Needs assistance Sitting-balance support: Feet supported Sitting balance-Leahy Scale: Fair Sitting balance - Comments: UE support and progressed from modA > min guard assist sitting statically EOB.   Standing balance support: Bilateral upper extremity supported;During functional activity Standing balance-Leahy Scale: Poor Standing balance comment: Reliant on UE support                            Cognition Arousal/Alertness: Awake/alert Behavior During Therapy: WFL for tasks assessed/performed Overall Cognitive Status: History of cognitive impairments - at baseline Area of Impairment: Problem solving;Awareness;Safety/judgement;Following commands;Memory;Attention;Orientation                 Orientation Level: Situation;Place Current Attention Level: Selective Memory: Decreased recall of precautions;Decreased short-term memory Following Commands: Follows one step commands inconsistently;Follows one step commands with increased time Safety/Judgement: Decreased awareness of safety;Decreased awareness of deficits Awareness: Intellectual Problem Solving: Slow processing;Requires verbal cues General Comments: ptis in bed with lethargic presentation, very unfocused and unwilling to get up.  Returned after bed ex to help her get up to do orthostatic BP;s with no positive findings      Exercises General Exercises - Lower Extremity Ankle Circles/Pumps: AAROM;5 reps Quad Sets: AROM;AAROM;10 reps Gluteal Sets: AROM;AAROM;10 reps Heel Slides: AAROM;20 reps Hip ABduction/ADduction: AAROM;10 reps Straight Leg Raises: AAROM;10 reps  General Comments General comments (skin integrity, edema, etc.): pt is demonstrating a limited tolerance for movement, not orthostatic with BP checks but does not stand up well.  Complaining about B  feet hurting at end of session, pt is avoiding being up      Pertinent Vitals/Pain Pain Assessment: Faces Faces Pain Scale: Hurts little more Pain Location: ribs and back Pain Descriptors / Indicators: Grimacing;Guarding;Tender Pain Intervention(s): Limited activity within patient's tolerance;Monitored during session;Repositioned    Home Living Family/patient expects to be discharged to:: Private residence                    Prior Function            PT Goals (current goals can now be found in the care plan section) Acute Rehab PT Goals Patient Stated Goal: to feel better Progress towards PT goals: Not progressing toward goals - comment    Frequency    Min 2X/week      PT Plan Current plan remains appropriate    Co-evaluation PT/OT/SLP Co-Evaluation/Treatment: Yes Reason for Co-Treatment: For patient/therapist safety;To address functional/ADL transfers PT goals addressed during session: Balance;Mobility/safety with mobility OT goals addressed during session: ADL's and self-care;Proper use of Adaptive equipment and DME      AM-PAC PT "6 Clicks" Mobility   Outcome Measure  Help needed turning from your back to your side while in a flat bed without using bedrails?: A Lot Help needed moving from lying on your back to sitting on the side of a flat bed without using bedrails?: A Lot Help needed moving to and from a bed to a chair (including a wheelchair)?: A Lot Help needed standing up from a chair using your arms (e.g., wheelchair or bedside chair)?: A Lot Help needed to walk in hospital room?: A Lot Help needed climbing 3-5 steps with a railing? : A Lot 6 Click Score: 12    End of Session Equipment Utilized During Treatment: Gait belt Activity Tolerance: Patient limited by fatigue;Treatment limited secondary to medical complications (Comment) Patient left: in bed;with call bell/phone within reach;with bed alarm set Nurse Communication: Mobility status PT  Visit Diagnosis: Other abnormalities of gait and mobility (R26.89);Pain;Muscle weakness (generalized) (M62.81);Unsteadiness on feet (R26.81);Difficulty in walking, not elsewhere classified (R26.2) Pain - Right/Left: Right Pain - part of body:  (ribcage)     Time: 1423-1445 (+ 1450 to 1507) PT Time Calculation (min) (ACUTE ONLY): 22 min  Charges:  $Therapeutic Exercise: 8-22 mins $Therapeutic Activity: 8-22 mins                    Ivar Drape 03/27/2020, 5:08 PM  Samul Dada, PT MS Acute Rehab Dept. Number: [facility] [id number] and [facility] [phone]

## 2020-03-27 NOTE — Progress Notes (Signed)
PROGRESS NOTE  Jordan Horne TTS:[id number] DOB: [birth year] DOA: 03/07/2020 PCP: Jordan Riches, NP  Brief History   Jordan Horne a 87 y.o.femalewith medical history significant formild dementia, HLD, hypothyroidism who presents after a fall at home. She waswalking at home today when she fell down 3 steps at about 4:30 PM. Patient states she just tripped and fell. Patient states she hit the back of her headand landed on her right side. She is having moderate to severe pain in the back of her head and neck. Patient states she is having pain also on the right side of her ribs.She did not take anything for the pain at home.She denies any shortness of breath. No abdominal pain.She did not have LOC. No seizure activity reported. Per husband, patient has completed both Covid vaccinations x2 and had received booster last week as well.  The patient underwent thoracentesis today due to large right pleural effusion seen on CT performed on 03/21/2020. Procedure was stopped when fluid drawn out was grossly bloody. This is as per documentation provided by Earley Abide, PA-C which served as my only notification.  Pulmonology has been consulted. TCTS has also been consulted. CT placed on 03/24/2020. Follow up CXR demonstrates resolution of the hemothorax. TCTS plans on repeating CT chest today with possible removal of chest tube later in the day.  Consultants  . Urology . Interventional Radiology . Pulmonology . CTS  Procedures  . Thoracentesis . Chest tube placement  Antibiotics   Anti-infectives (From admission, onward)   Start     Dose/Rate Route Frequency Ordered Stop   03/16/20 0800  cefTRIAXone (ROCEPHIN) 1 g in sodium chloride 0.9 % 100 mL IVPB  Status:  Discontinued        1 g 200 mL/hr over 30 Minutes Intravenous Every 24 hours 03/16/20 0713 03/23/20 1341      Subjective  The patient is lying in bed. No new complaints.  Objective   Vitals:  Vitals:    03/27/20 1352 03/27/20 1354  BP:  (!) 108/59  Pulse:  87  Resp: 16 16  Temp:  98 F (36.7 C)  SpO2:  96%   Exam:  Constitutional:  . The patient is awake, alert, and oriented x 3. Mild distress from pain from chest tube and rib fractures. Respiratory:  . No increased work of breathing. . No wheezes, rales, or rhonchi . No tactile fremitus Cardiovascular:  . Regular rate and rhythm . No murmurs, ectopy, or gallups. . No lateral PMI. No thrills. . Dullness to percussion in the lower 1/3 - 1/2 of the l Abdomen:  . Abdomen is soft, non-tender, non-distended . No hernias, masses, or organomegaly . Normoactive bowel sounds.  Musculoskeletal:  . No cyanosis, clubbing, or edema Skin:  . No rashes, lesions, ulcers . palpation of skin: no induration or nodules Neurologic:  . CN 2-12 intact . Sensation all 4 extremities intact Psychiatric:  . Mental status o Mood, affect appropriate o Orientation to person, place, time  . judgment and insight appear intact  I have personally reviewed the following:   Today's Data  . Vitals, CBC, BMP  Imaging  . CTA chest . CXR  Scheduled Meds: . (feeding supplement) PROSource Plus  30 mL Oral BID BM  . acetaminophen  650 mg Oral TID  . aspirin EC  81 mg Oral Daily  . bisacodyl  10 mg Rectal Once  . Chlorhexidine Gluconate Cloth  6 each Topical Daily  . cyanocobalamin  1,000 mcg  Intramuscular Weekly  . feeding supplement  237 mL Oral TID BM  . levothyroxine  100 mcg Oral Daily  . lidocaine  1 patch Transdermal Q24H  . pantoprazole  40 mg Oral BID AC  . QUEtiapine  50 mg Oral QHS  . rosuvastatin  10 mg Oral Daily  . senna-docusate  2 tablet Oral BID   Continuous Infusions:   Principal Problem:   Multiple fractures of ribs, right side, initial encounter for closed fracture Active Problems:   Intractable pain   Dementia (HCC)   Fall at home, initial encounter   Elevated blood pressure reading   Prolonged QT interval   Right  rib fracture   LOS: 19 days   A & P  Multiple fractures rib fractures: Secondary to mechanical fall at home, fractures of right eighth, ninth ribs with intractable pain. Lumbar spine x-ray negative for any fractures. PT evaluation recommended SNF versus home health PT with 24/7 support. Family requested SNF, social work consult placed. She is on scheduled Tylenol, Lidoderm patch & as needed p.o. oxycodone .  Hemothorax: 10cc blood obtained from attempted thoracentesis this afternoon. Stat H&H has been ordered. There is near collapsse of the right lower lobe.  I have discussed the patient with PCCM. They will consult on the patient. They have recommended following H&H and O2 saturations. TCTS was consulted this morning. They have evaluated the patient and discussed her with her family. Monitor H&H. CT placed on 03/24/2020. Follow up CXR demonstrates resolution of the hemothorax. TCTS plans on repeating CT chest this afternoon with possible removal of tube later in the day.  Acute metabolic encephalopathy: Resolved. It does appear she did start to develop some dementia over last few months. She does appear to be with some baseline dementia, significantly confused during hospital stay, at some point lethargic, work-up significant for abnormal TSH, low B12, and UTI. Continue with Seroquel, B12 supplements and Rocephin for UTI.  COVID-19 positive: Incidental COVID-19 positive on admission. Has pleuritic chest pain from the fractures of right eighth ninth ribs.  Had vaccination x2, booster last week. Continue pain control incentive spirometry, O2 sats 98% on room air. Chest x-ray clear on 12/14. Per pharmacy, does not meet criteria for Mab infusion due to partially or fully vaccinated for COVID-19, asymptomatic with a cycle time> 32. Currently stable, O2 sats 99% on room air, asymptomatic. D-dimers is elevated, but trending down, Doppler negative for DVT. Covid isolation expired 12/25.  Hypothyroidism: TSH  elevated at 14, have increased her Synthroid from 75 to 100 mcg .  We will need to recheck TSH level in 4 to 6 weeks.  Urinary retention: Hypotension, for which she has required Foley insertion. Foley catheter placed on 12/27 by urology. Recommend leaving Foley catheter for 3 to 4 days for maximum bladder decompression and may discharge with the Foley catheter and follow-up with [facility] for voiding trial.    UTI:  Continue Rocephin.  Unfortunately urine culture is not collected prior to start of antibiotics.  She is afebrile.  Hypokalemia/hyponatremia: Resolved  Hyperlipidemia: Continue statin  I have seen and examined this patient myself. I have spent 34 minutes in her evaluation and care.  DVT prophylaxis: SCD/Lovenox  code Status: Full code Family Communication:  None present at bedside.  Plan of care discussed with patient in length and he verbalized understanding and agreed with it.  Disposition Plan: SNF  Natasia Sanko, DO [facility] Direct contact: see [email or web address]  7PM-7AM contact night coverage as above  03/28/2019, 8:14 PM  LOS: 14 days

## 2020-03-27 NOTE — Progress Notes (Signed)
Occupational Therapy Treatment Patient Details Name: Jordan Horne MRN: [id number] DOB: 10/05/1932 Today's Date: 03/27/2020    History of present illness Pt is an 87 y.o. female admitted 03/07/20 after fall down steps hitting the back of her head with no LOC. Pt sustained R 8-9th rib fxs. Incidental (+) COVID-19; pt fully vaccinated. CXR clear. PMH includes mild dementia.   OT comments  Session limited due to transport on way to take pt for testing. Planned to get pt up in recliner, however RN informed therapists of transport on the way. Pt in bed upon arrival, pleasantly confused and agreeable to EOB upon arrival and sat EOB with mod A for activity. Sit - stand min A + 2 HHA at EOB x ~1 minute. OT will continue to follow acutely to maximize level of function and safety Orthostatics taken and listed below: Supine 121/60 Sitting 116/75 Standing 112/76  Follow Up Recommendations  SNF;Supervision/Assistance - 24 hour    Equipment Recommendations  Other (comment);Wheelchair cushion (measurements OT);Wheelchair (measurements OT) (TBD at next venue of care)    Recommendations for Other Services      Precautions / Restrictions Precautions Precautions: Fall Precaution Comments: R chest tube x1; Orthostatics Restrictions Weight Bearing Restrictions: No       Mobility Bed Mobility Overal bed mobility: Needs Assistance Bed Mobility: Supine to Sit;Sit to Supine     Supine to sit: Mod assist Sit to supine: Mod assist   General bed mobility comments: pt iitiated moving LEs toward EOB, but required assist to complete sup - sit transition and back to supine. Pt sat EOB  Transfers Overall transfer level: Needs assistance Equipment used: 2 person hand held assist Transfers: Sit to/from Stand Sit to Stand: Min assist;+2 physical assistance         General transfer comment: Multi-modal cues to initiate sit to stand from EOB. Mod A for steadying, pt with flexed posture leaning foward  with fear of falling    Balance Overall balance assessment: Needs assistance Sitting-balance support: Bilateral upper extremity supported;Feet supported Sitting balance-Leahy Scale: Poor Sitting balance - Comments: UE support and progressed from modA > min guard assist sitting statically EOB.   Standing balance support: Bilateral upper extremity supported;During functional activity;Single extremity supported Standing balance-Leahy Scale: Poor Standing balance comment: Reliant on UE support                           ADL either performed or assessed with clinical judgement   ADL                                               Vision Patient Visual Report: No change from baseline     Perception     Praxis      Cognition Arousal/Alertness: Awake/alert Behavior During Therapy: WFL for tasks assessed/performed Overall Cognitive Status: History of cognitive impairments - at baseline Area of Impairment: Orientation;Attention;Memory;Following commands;Safety/judgement;Awareness;Problem solving                 Orientation Level: Disoriented to;Place;Time;Situation   Memory: Decreased recall of precautions;Decreased short-term memory Following Commands: Follows one step commands with increased time;Follows one step commands inconsistently Safety/Judgement: Decreased awareness of safety;Decreased awareness of deficits   Problem Solving: Slow processing;Decreased initiation;Difficulty sequencing;Requires verbal cues;Requires tactile cues          Exercises  Shoulder Instructions       General Comments      Pertinent Vitals/ Pain       Pain Assessment: Faces Faces Pain Scale: Hurts little more Pain Location: ribs and back Pain Descriptors / Indicators: Guarding;Grimacing;Moaning Pain Intervention(s): Limited activity within patient's tolerance;Monitored during session;Repositioned  Home Living Family/patient expects to be discharged  to:: Private residence                                        Prior Functioning/Environment              Frequency  Min 2X/week        Progress Toward Goals  OT Goals(current goals can now be found in the care plan section)  Progress towards OT goals: Progressing toward goals  Acute Rehab OT Goals Patient Stated Goal: to feel better  Plan Discharge plan remains appropriate    Co-evaluation    PT/OT/SLP Co-Evaluation/Treatment: Yes Reason for Co-Treatment: Complexity of the patient's impairments (multi-system involvement);Necessary to address cognition/behavior during functional activity;For patient/therapist safety;To address functional/ADL transfers   OT goals addressed during session: ADL's and self-care;Proper use of Adaptive equipment and DME      AM-PAC OT "6 Clicks" Daily Activity     Outcome Measure   Help from another person eating meals?: None Help from another person taking care of personal grooming?: A Little Help from another person toileting, which includes using toliet, bedpan, or urinal?: A Lot Help from another person bathing (including washing, rinsing, drying)?: A Lot Help from another person to put on and taking off regular upper body clothing?: A Little Help from another person to put on and taking off regular lower body clothing?: Total 6 Click Score: 15    End of Session    OT Visit Diagnosis: Unsteadiness on feet (R26.81);Other abnormalities of gait and mobility (R26.89);Muscle weakness (generalized) (M62.81);Other symptoms and signs involving cognitive function   Activity Tolerance Patient limited by fatigue;Other (comment) (fear of falling)   Patient Left in bed;with call bell/phone within reach;with bed alarm set   Nurse Communication          Time: [phone] OT Time Calculation (min): 18 min  Charges: OT General Charges $OT Visit: 1 Visit OT Treatments $Therapeutic Activity: 8-22 mins     Galen Manila 03/27/2020, 4:10 PM

## 2020-03-27 NOTE — Progress Notes (Addendum)
      [address]       [place] [address]             [phone]      4 Days Post-Op Procedure(s) (LRB): CHEST TUBE INSERTION (Right) Subjective: A little confused this morning but doing well.   Objective: Vital signs in last 24 hours: Temp:  [97.9 F (36.6 C)-99.5 F (37.5 C)] 98.5 F (36.9 C) (01/03 0530) Pulse Rate:  [88-90] 89 (01/03 0530) Resp:  [18] 18 (01/03 0530) BP: (106-122)/(62-72) 122/64 (01/03 0530) SpO2:  [93 %-98 %] 94 % (01/03 0530)     Intake/Output from previous day: 01/02 0701 - 01/03 0700 In: 240 [P.O.:240] Out: 600 [Urine:550; Chest Tube:50] Intake/Output this shift: No intake/output data recorded.  General appearance: alert, cooperative and no distress Heart: regular rate and rhythm, S1, S2 normal, no murmur, click, rub or gallop Lungs: clear to auscultation bilaterally Abdomen: soft, non-tender; bowel sounds normal; no masses,  no organomegaly Extremities: extremities normal, atraumatic, no cyanosis or edema Wound: clean and dry around chest tube  Lab Results: Recent Labs    03/25/20 0233 03/26/20 0143  WBC 9.3 9.8  HGB 9.9* 9.8*  HCT 31.1* 30.7*  PLT 351 315   BMET:  Recent Labs    03/26/20 0143 03/27/20 0256  NA 140 140  K 3.9 3.7  CL 110 104  CO2 24 24  GLUCOSE 106* 101*  BUN 18 18  CREATININE 0.67 0.68  CALCIUM 8.1* 8.7*    PT/INR: No results for input(s): LABPROT, INR in the last 72 hours. ABG No results found for: PHART, HCO3, TCO2, ACIDBASEDEF, O2SAT CBG (last 3)  No results for input(s): GLUCAP in the last 72 hours.  Assessment/Plan: S/P Procedure(s) (LRB): CHEST TUBE INSERTION (Right)  1. CV-BP well controlled, NSR in the 80s 2. Pulm- CXR stable and shows: No substantial change. Right chest tube remains in place without evidence for right-sided pneumothorax. Chest tube put out 50cc/24 hours. No air leak with cough. 3. Anemia-H and H 9.8/30.7, stable  Plan: Drainage has decrease and CXR  stable. Might be able to remove chest tube today. Encouraged ambulation in the halls with assistance.       LOS: 19 days    Sharlene Dory 03/27/2020  patient examined and todays CXR image reviewed  minimal chest tube output in last 24 hrs, CXR with mild atelectasis R base CT chest w/o contrast is pending- if completed before too late and is  satisfactory will DC tube today and review CXR ordered for tomorrow  P Donata Clay MD

## 2020-03-28 ENCOUNTER — Inpatient Hospital Stay (HOSPITAL_COMMUNITY): Payer: Medicare Other

## 2020-03-28 DIAGNOSIS — S2241XA Multiple fractures of ribs, right side, initial encounter for closed fracture: Secondary | ICD-10-CM | POA: Diagnosis not present

## 2020-03-28 LAB — COMPREHENSIVE METABOLIC PANEL
ALT: 366 U/L — ABNORMAL HIGH (ref 0–44)
AST: 350 U/L — ABNORMAL HIGH (ref 15–41)
Albumin: 2.5 g/dL — ABNORMAL LOW (ref 3.5–5.0)
Alkaline Phosphatase: 140 U/L — ABNORMAL HIGH (ref 38–126)
Anion gap: 9 (ref 5–15)
BUN: 15 mg/dL (ref 8–23)
CO2: 23 mmol/L (ref 22–32)
Calcium: 8.5 mg/dL — ABNORMAL LOW (ref 8.9–10.3)
Chloride: 107 mmol/L (ref 98–111)
Creatinine, Ser: 0.56 mg/dL (ref 0.44–1.00)
GFR, Estimated: >60 mL/min (ref >60)
Glucose, Bld: 109 mg/dL — ABNORMAL HIGH (ref 70–99)
Potassium: 3.8 mmol/L (ref 3.5–5.1)
Sodium: 139 mmol/L (ref 135–145)
Total Bilirubin: 1 mg/dL (ref 0.3–1.2)
Total Protein: 6.4 g/dL — ABNORMAL LOW (ref 6.5–8.1)

## 2020-03-28 NOTE — Hospital Course (Signed)
      [address]       [place] [address]             [phone]       HPI:   We are asked to see this patient in cardiothoracic surgical consultation for hemothorax.  The patient is an 87 year old female who suffered a fall with multiple right-sided fractured ribs.  Reportedly, she fell down 3 steps.  She reports that she has had multiple recent falls.  She was admitted on 03/07/2020 to the hospitalist service for further management including pain control.  Her primary complaints have been of back pain.  She does have a history of dementia, hyperlipidemia, and hypothyroidism.  Lumbar spine x-ray is negative for fracture.  She has reportedly  had some variations in her dementia with component of hospital delirium during hospitalization.  She has also had a acute urinary retention requiring urology consultation and Foley placement.  Interventional radiology was consulted and she underwent a attempted thoracentesis on 03/22/2020 which yielded 10 mL of dark red fluid.  The procedure was discontinued after 10 mL as there was clot in the catheter.  Pulmonary medicine was subsequently consulted.  She has been on DVT prophylaxis of Lovenox during the hospitalization.  Her hemoglobin and hematocrit have stabilized and is not felt as though there is ongoing bleeding.  They feel as though a small pigtail catheter would not be acceptable in terms of drainage and we have been consulted for consideration of further opinion to include possible placement of a large bore chest tube versus potential need for surgical intervention.  A CT of the chest has been obtained on 03/21/2020 and will be reviewed by the surgeon.  Hospital Course:   A chest tube was placed using local anesthetic on 03/23/2020 for a hemothorax. POD 1 she remained hemodynamically stable and in NSR. It was determined she would not be a candidate for pharmacologic anticoagulation. She had multiple rib fractures due to her fall. Her  H and H remained stable. CT of the chest obtained on 1/3 which showed: a moderate right pleural effusion and the chest tube is in the fissure. She remained in NSR and on room air. We continued her chest tube at this time.

## 2020-03-28 NOTE — Progress Notes (Signed)
PROGRESS NOTE  EULALIA ELLERMAN OEU:[id number] DOB: June 21, 1932 DOA: 03/07/2020 PCP: Dema Severin, NP  Brief History   Guilianna Mckoy Presnellis a 87 y.o.femalewith medical history significant formild dementia, HLD, hypothyroidism who presents after a fall at home. She waswalking at home today when she fell down 3 steps at about 4:30 PM. Patient states she just tripped and fell. Patient states she hit the back of her headand landed on her right side. She is having moderate to severe pain in the back of her head and neck. Patient states she is having pain also on the right side of her ribs.She did not take anything for the pain at home.She denies any shortness of breath. No abdominal pain.She did not have LOC. No seizure activity reported. Per husband, patient has completed both Covid vaccinations x2 and had received booster last week as well.  The patient underwent thoracentesis today due to large right pleural effusion seen on CT performed on 03/21/2020. Procedure was stopped when fluid drawn out was grossly bloody. This is as per documentation provided by Elwin Mocha, PA-C which served as my only notification.  Pulmonology has been consulted. TCTS has also been consulted. CT placed on 03/24/2020. Follow up CXR demonstrates resolution of the hemothorax. TCTS has removed the patient's chest tube.  Consultants  . Urology . Interventional Radiology . Pulmonology . CTS  Procedures  . Thoracentesis . Chest tube placement  Antibiotics   Anti-infectives (From admission, onward)   Start     Dose/Rate Route Frequency Ordered Stop   03/16/20 0800  cefTRIAXone (ROCEPHIN) 1 g in sodium chloride 0.9 % 100 mL IVPB  Status:  Discontinued        1 g 200 mL/hr over 30 Minutes Intravenous Every 24 hours 03/16/20 0713 03/23/20 1341      Subjective  The patient is lying in bed. No new complaints.  Objective   Vitals:  Vitals:   03/28/20 0500 03/28/20 1330  BP: 101/83 115/64   Pulse: 85 89  Resp: 18 18  Temp: 98.6 F (37 C) 98.5 F (36.9 C)  SpO2: 96% 97%   Exam:  Constitutional:  . The patient is awake, alert, and oriented x 3. Mild distress from pain from chest tube and rib fractures. Respiratory:  . No increased work of breathing. . No wheezes, rales, or rhonchi . No tactile fremitus Cardiovascular:  . Regular rate and rhythm . No murmurs, ectopy, or gallups. . No lateral PMI. No thrills. . Dullness to percussion in the lower 1/3 - 1/2 of the l Abdomen:  . Abdomen is soft, non-tender, non-distended . No hernias, masses, or organomegaly . Normoactive bowel sounds.  Musculoskeletal:  . No cyanosis, clubbing, or edema Skin:  . No rashes, lesions, ulcers . palpation of skin: no induration or nodules Neurologic:  . CN 2-12 intact . Sensation all 4 extremities intact Psychiatric:  . Mental status o Mood, affect appropriate o Orientation to person, place, time  . judgment and insight appear intact  I have personally reviewed the following:   Today's Data  . Vitals, CBC, BMP  Imaging  . CTA chest . CXR  Scheduled Meds: . (feeding supplement) PROSource Plus  30 mL Oral BID BM  . acetaminophen  650 mg Oral TID  . aspirin EC  81 mg Oral Daily  . bisacodyl  10 mg Rectal Once  . Chlorhexidine Gluconate Cloth  6 each Topical Daily  . cyanocobalamin  1,000 mcg Intramuscular Weekly  . feeding supplement  237  mL Oral TID BM  . levothyroxine  100 mcg Oral Daily  . lidocaine  1 patch Transdermal Q24H  . pantoprazole  40 mg Oral BID AC  . QUEtiapine  50 mg Oral QHS  . rosuvastatin  10 mg Oral Daily  . senna-docusate  2 tablet Oral BID   Continuous Infusions:   Principal Problem:   Multiple fractures of ribs, right side, initial encounter for closed fracture Active Problems:   Intractable pain   Dementia (HCC)   Fall at home, initial encounter   Elevated blood pressure reading   Prolonged QT interval   Right rib fracture   LOS: 20  days   A & P  Multiple fractures rib fractures: Secondary to mechanical fall at home, fractures of right eighth, ninth ribs with intractable pain. Lumbar spine x-ray negative for any fractures. PT evaluation recommended SNF versus home health PT with 24/7 support. Family requested SNF, social work consult placed. She is on scheduled Tylenol, Lidoderm patch & as needed p.o. oxycodone .  Hemothorax: 10cc blood obtained from attempted thoracentesis this afternoon. Stat H&H has been ordered. There is near collapsse of the right lower lobe.  I have discussed the patient with PCCM. They will consult on the patient. They have recommended following H&H and O2 saturations. TCTS was consulted this morning. They have evaluated the patient and discussed her with her family. Monitor H&H. CT placed on 03/24/2020. Follow up CXR demonstrates resolution of the hemothorax. TCTS has removed the patient's chest tube on 03/29/2019.  Acute metabolic encephalopathy: Resolved. It does appear she did start to develop some dementia over last few months. She does appear to be with some baseline dementia, significantly confused during hospital stay, at some point lethargic, work-up significant for abnormal TSH, low B12, and UTI. Continue with Seroquel, B12 supplements and Rocephin for UTI.  COVID-19 positive: Incidental COVID-19 positive on admission. Has pleuritic chest pain from the fractures of right eighth ninth ribs.  Had vaccination x2, booster last week. Continue pain control incentive spirometry, O2 sats 98% on room air. Chest x-ray clear on 12/14. Per pharmacy, does not meet criteria for Mab infusion due to partially or fully vaccinated for COVID-19, asymptomatic with a cycle time> 32. Currently stable, O2 sats 99% on room air, asymptomatic. D-dimers is elevated, but trending down, Doppler negative for DVT. Covid isolation expired 12/25.  Hypothyroidism: TSH elevated at 14, have increased her Synthroid from 75 to 100 mcg .   We will need to recheck TSH level in 4 to 6 weeks.  Urinary retention: Hypotension, for which she has required Foley insertion. Foley catheter placed on 12/27 by urology. Recommend leaving Foley catheter for 3 to 4 days for maximum bladder decompression and may discharge with the Foley catheter and follow-up with [facility] for voiding trial.    UTI:  Continue Rocephin.  Unfortunately urine culture is not collected prior to start of antibiotics.  She is afebrile.  Hypokalemia/hyponatremia: Resolved  Hyperlipidemia: Continue statin  I have seen and examined this patient myself. I have spent 32 minutes in her evaluation and care.  DVT prophylaxis: SCD/Lovenox  code Status: Full code Family Communication:  None present at bedside.  Plan of care discussed with patient in length and he verbalized understanding and agreed with it.  Disposition Plan: SNF  Bentleigh Waren, DO [facility] Direct contact: see [email or web address]  7PM-7AM contact night coverage as above 03/29/2019, 6:26 PM  LOS: 14 days

## 2020-03-28 NOTE — Progress Notes (Addendum)
      [address]       [place] [address]             [phone]      5 Days Post-Op Procedure(s) (LRB): CHEST TUBE INSERTION (Right) Subjective: Sleepy this morning, just received some pain medication.   Objective: Vital signs in last 24 hours: Temp:  [98 F (36.7 C)-98.8 F (37.1 C)] 98.6 F (37 C) (01/04 0500) Pulse Rate:  [85-87] 85 (01/04 0500) Resp:  [16-18] 18 (01/04 0500) BP: (101-130)/(59-83) 101/83 (01/04 0500) SpO2:  [96 %-100 %] 96 % (01/04 0500)     Intake/Output from previous day: 01/03 0701 - 01/04 0700 In: 520 [P.O.:520] Out: 1075 [Urine:1075] Intake/Output this shift: No intake/output data recorded.  General appearance: alert, cooperative and no distress Heart: regular rate and rhythm, S1, S2 normal, no murmur, click, rub or gallop Lungs: clear to auscultation bilaterally Abdomen: soft, non-tender; bowel sounds normal; no masses,  no organomegaly Extremities: extremities normal, atraumatic, no cyanosis or edema Wound: clean and dry  Lab Results: Recent Labs    03/26/20 0143  WBC 9.8  HGB 9.8*  HCT 30.7*  PLT 315   BMET:  Recent Labs    03/27/20 0256 03/28/20 0119  NA 140 139  K 3.7 3.8  CL 104 107  CO2 24 23  GLUCOSE 101* 109*  BUN 18 15  CREATININE 0.68 0.56  CALCIUM 8.7* 8.5*    PT/INR: No results for input(s): LABPROT, INR in the last 72 hours. ABG No results found for: PHART, HCO3, TCO2, ACIDBASEDEF, O2SAT CBG (last 3)  No results for input(s): GLUCAP in the last 72 hours.  Assessment/Plan: S/P Procedure(s) (LRB): CHEST TUBE INSERTION (Right)  1.CT scan shows:  Moderate right pleural effusion with chest tube in the right fissure. 2. CXR shows no increase in effusion and no pneumothorax 3. CV- BP low normal. NSR in the 80s 4. Pulm- tolerating room air with good oxygen saturation  Plan: Really unable to check for an air leak this morning due to patient being too drowsy. According to the CT scan the  chest tube is in the fissure and is likely not doing much. She still has a moderate pleural effusion on the right side.   After much encouragement the patient did wake up enough to position herself and follow commands. Chest tube was removed without issue. Patient is resting comfortably now. CXR ordered for the morning.    LOS: 20 days    Sharlene Dory 03/28/2020  I have seen and examined the patient and agree with the assessment and plan as outlined.  Tube D/C'd today.  Purcell Nails, MD 03/28/2020 2:34 PM

## 2020-03-29 ENCOUNTER — Inpatient Hospital Stay (HOSPITAL_COMMUNITY): Payer: Medicare Other

## 2020-03-29 DIAGNOSIS — S2241XA Multiple fractures of ribs, right side, initial encounter for closed fracture: Secondary | ICD-10-CM | POA: Diagnosis not present

## 2020-03-29 LAB — COMPREHENSIVE METABOLIC PANEL
ALT: 271 U/L — ABNORMAL HIGH (ref 0–44)
AST: 179 U/L — ABNORMAL HIGH (ref 15–41)
Albumin: 2.1 g/dL — ABNORMAL LOW (ref 3.5–5.0)
Alkaline Phosphatase: 118 U/L (ref 38–126)
Anion gap: 8 (ref 5–15)
BUN: 13 mg/dL (ref 8–23)
CO2: 25 mmol/L (ref 22–32)
Calcium: 8.3 mg/dL — ABNORMAL LOW (ref 8.9–10.3)
Chloride: 106 mmol/L (ref 98–111)
Creatinine, Ser: 0.6 mg/dL (ref 0.44–1.00)
GFR, Estimated: >60 mL/min (ref >60)
Glucose, Bld: 104 mg/dL — ABNORMAL HIGH (ref 70–99)
Potassium: 3.5 mmol/L (ref 3.5–5.1)
Sodium: 139 mmol/L (ref 135–145)
Total Bilirubin: 0.8 mg/dL (ref 0.3–1.2)
Total Protein: 5.4 g/dL — ABNORMAL LOW (ref 6.5–8.1)

## 2020-03-29 NOTE — Progress Notes (Signed)
Physical Therapy Treatment Patient Details Name: Jordan Horne MRN: [id number] DOB: [birth year] Today's Date: 03/29/2020    History of Present Illness 87 y.o. female admitted 03/07/20 after fall down steps hitting the back of her head with no LOC. Pt sustained R 8-9th rib fxs. Incidental (+) COVID-19; pt fully vaccinated. CXR clear. PMH includes mild dementia.    PT Comments    Patient received in bed, agreeable to PT session. Patient requires min assist for log rolling and side lying to sit. Min assist to stand and walk 10-12 feet in room with RW. Constant cues for continued mobility and min assist for safety with use of RW. She is limited by reported burning pain in her feet with ambulation and fatigue. Patient will continue to benefit from skilled PT while here to improve strength, functional independence and safety with mobility.      Follow Up Recommendations  SNF;Supervision for mobility/OOB     Equipment Recommendations  None recommended by PT;Other (comment) (TBD at next venue)    Recommendations for Other Services       Precautions / Restrictions Precautions Precautions: Fall Restrictions Weight Bearing Restrictions: No    Mobility  Bed Mobility Overal bed mobility: Needs Assistance Bed Mobility: Rolling;Sidelying to Sit Rolling: Min assist Sidelying to sit: Mod assist       General bed mobility comments: improved ability this session without pain reported  Transfers Overall transfer level: Needs assistance Equipment used: Rolling walker (2 wheeled) Transfers: Sit to/from Stand Sit to Stand: Min assist;+2 physical assistance;+2 safety/equipment;From elevated surface            Ambulation/Gait Ambulation/Gait assistance: Min assist;+2 safety/equipment Gait Distance (Feet): 12 Feet Assistive device: Rolling walker (2 wheeled) Gait Pattern/deviations: Step-to pattern;Decreased step length - right;Decreased step length - left;Trunk flexed Gait velocity:  Decreased   General Gait Details: patient ambulated with RW, constant cues needed for progression. Tends to push walker out too far in front without moving feet.   Stairs             Wheelchair Mobility    Modified Rankin (Stroke Patients Only)       Balance Overall balance assessment: Needs assistance Sitting-balance support: Feet supported Sitting balance-Leahy Scale: Good     Standing balance support: Bilateral upper extremity supported;During functional activity Standing balance-Leahy Scale: Fair Standing balance comment: Reliant on UE support                            Cognition Arousal/Alertness: Awake/alert Behavior During Therapy: WFL for tasks assessed/performed Overall Cognitive Status: History of cognitive impairments - at baseline Area of Impairment: Awareness;Safety/judgement;Problem solving;Following commands;Memory;Orientation                 Orientation Level: Disoriented to;Situation Current Attention Level: Focused Memory: Decreased short-term memory Following Commands: Follows one step commands inconsistently;Follows one step commands with increased time Safety/Judgement: Decreased awareness of safety;Decreased awareness of deficits Awareness: Intellectual Problem Solving: Slow processing;Requires verbal cues;Requires tactile cues        Exercises Other Exercises Other Exercises: B LE exercises: AP, hip abd/add, LAQ, heel slides x 10 reps each    General Comments        Pertinent Vitals/Pain Pain Assessment: Faces Faces Pain Scale: Hurts a little bit Pain Location: burning in feet with ambulation Pain Descriptors / Indicators: Burning Pain Intervention(s): Limited activity within patient's tolerance;Monitored during session;Repositioned    Home Living  Prior Function            PT Goals (current goals can now be found in the care plan section) Acute Rehab PT Goals Patient Stated  Goal: to feel better PT Goal Formulation: With patient Time For Goal Achievement: 04/07/20 Potential to Achieve Goals: Fair Progress towards PT goals: Progressing toward goals    Frequency    Min 2X/week      PT Plan Current plan remains appropriate    Co-evaluation              AM-PAC PT "6 Clicks" Mobility   Outcome Measure  Help needed turning from your back to your side while in a flat bed without using bedrails?: A Little Help needed moving from lying on your back to sitting on the side of a flat bed without using bedrails?: A Little Help needed moving to and from a bed to a chair (including a wheelchair)?: A Little Help needed standing up from a chair using your arms (e.g., wheelchair or bedside chair)?: A Little Help needed to walk in hospital room?: A Lot Help needed climbing 3-5 steps with a railing? : Total 6 Click Score: 15    End of Session Equipment Utilized During Treatment: Gait belt Activity Tolerance: Patient limited by fatigue;Patient limited by pain Patient left: in chair;with call bell/phone within Horne;with chair alarm set Nurse Communication: Mobility status PT Visit Diagnosis: Other abnormalities of gait and mobility (R26.89);Pain;Muscle weakness (generalized) (M62.81);Unsteadiness on feet (R26.81);Difficulty in walking, not elsewhere classified (R26.2);History of falling (Z91.81) Pain - Right/Left: Right Pain - part of body:  (ribs, B feet)     Time: 1127-1150 PT Time Calculation (min) (ACUTE ONLY): 23 min  Charges:  $Gait Training: 8-22 mins $Therapeutic Exercise: 8-22 mins                     Lasasha Brophy, PT, GCS 03/29/20,12:41 PM

## 2020-03-29 NOTE — Progress Notes (Signed)
PROGRESS NOTE  Jordan Horne BOF:[id number] DOB: [birth year] DOA: 03/07/2020 PCP: Dema Severin, NP  Brief History   Jordan Horne a 87 y.o.femalewith medical history significant formild dementia, HLD, hypothyroidism who presents after a fall at home. She waswalking at home today when she fell down 3 steps at about 4:30 PM. Patient states she just tripped and fell. Patient states she hit the back of her headand landed on her right side. She is having moderate to severe pain in the back of her head and neck. Patient states she is having pain also on the right side of her ribs.She did not take anything for the pain at home.She denies any shortness of breath. No abdominal pain.She did not have LOC. No seizure activity reported. Per husband, patient has completed both Covid vaccinations x2 and had received booster last week as well.  The patient underwent thoracentesis today due to large right pleural effusion seen on CT performed on 03/21/2020. Procedure was stopped when fluid drawn out was grossly bloody. This is as per documentation provided by Elwin Mocha, PA-C which served as my only notification.  Pulmonology has been consulted. TCTS has also been consulted. CT placed on 03/24/2020. Follow up CXR demonstrates resolution of the hemothorax. TCTS has removed the patient's chest tube on 03/28/2020.  Consultants  . Urology . Interventional Radiology . Pulmonology . CTS  Procedures  . Thoracentesis . Chest tube placement  Antibiotics   Anti-infectives (From admission, onward)   Start     Dose/Rate Route Frequency Ordered Stop   03/16/20 0800  cefTRIAXone (ROCEPHIN) 1 g in sodium chloride 0.9 % 100 mL IVPB  Status:  Discontinued        1 g 200 mL/hr over 30 Minutes Intravenous Every 24 hours 03/16/20 0713 03/23/20 1341      Subjective  The patient is sitting up at bedside eating lunch. No new complaints.  Objective   Vitals:  Vitals:   03/29/20 0617  03/29/20 1316  BP: 113/60 (!) 124/56  Pulse: 78 88  Resp: 16 18  Temp: 98.8 F (37.1 C) 98.2 F (36.8 C)  SpO2: 96% 99%   Exam:  Constitutional:  . The patient is awake, alert, and oriented x 3. No acute distress. Respiratory:  . No increased work of breathing. . No wheezes, rales, or rhonchi . No tactile fremitus Cardiovascular:  . Regular rate and rhythm . No murmurs, ectopy, or gallups. . No lateral PMI. No thrills. . Dullness to percussion in the lower 1/3 - 1/2 of the l Abdomen:  . Abdomen is soft, non-tender, non-distended . No hernias, masses, or organomegaly . Normoactive bowel sounds.  Musculoskeletal:  . No cyanosis, clubbing, or edema Skin:  . No rashes, lesions, ulcers . palpation of skin: no induration or nodules Neurologic:  . CN 2-12 intact . Sensation all 4 extremities intact Psychiatric:  . Mental status o Mood, affect appropriate o Orientation to person, place, time  . judgment and insight appear intact  I have personally reviewed the following:   Today's Data  . Vitals, CBC, CMP  Imaging  . CTA chest . CXR (03/28/2020) . CXR (03/29/2020)  Scheduled Meds: . (feeding supplement) PROSource Plus  30 mL Oral BID BM  . acetaminophen  650 mg Oral TID  . aspirin EC  81 mg Oral Daily  . bisacodyl  10 mg Rectal Once  . Chlorhexidine Gluconate Cloth  6 each Topical Daily  . cyanocobalamin  1,000 mcg Intramuscular Weekly  . feeding  supplement  237 mL Oral TID BM  . levothyroxine  100 mcg Oral Daily  . lidocaine  1 patch Transdermal Q24H  . pantoprazole  40 mg Oral BID AC  . QUEtiapine  50 mg Oral QHS  . rosuvastatin  10 mg Oral Daily  . senna-docusate  2 tablet Oral BID   Continuous Infusions:   Principal Problem:   Multiple fractures of ribs, right side, initial encounter for closed fracture Active Problems:   Intractable pain   Dementia (HCC)   Fall at home, initial encounter   Elevated blood pressure reading   Prolonged QT interval    Right rib fracture   LOS: 21 days   A & P  Multiple fractures rib fractures: Secondary to mechanical fall at home, fractures of right eighth, ninth ribs with intractable pain. Lumbar spine x-ray negative for any fractures. PT evaluation recommended SNF versus home health PT with 24/7 support. Family requested SNF, social work consult placed. She is on scheduled Tylenol, Lidoderm patch & as needed p.o. oxycodone .  Hemothorax: 10cc blood obtained from attempted thoracentesis this afternoon. Stat H&H has been ordered. There is near collapsse of the right lower lobe.  I have discussed the patient with PCCM. They will consult on the patient. They have recommended following H&H and O2 saturations. TCTS was consulted this morning. They have evaluated the patient and discussed her with her family. Monitor H&H. CT placed on 03/24/2020. Follow up CXR demonstrates resolution of the hemothorax. TCTS has removed the patient's chest tube on 03/29/2019. CXR demonstrates a moderate pleural effusion on the right. However this is greatly reduced from previous.  Acute metabolic encephalopathy: Resolved. It does appear she did start to develop some dementia over last few months. She does appear to be with some baseline dementia, significantly confused during hospital stay, at some point lethargic, work-up significant for abnormal TSH, low B12, and UTI. Continue with Seroquel, B12 supplements and Rocephin for UTI.  COVID-19 positive: Incidental COVID-19 positive on admission. Has pleuritic chest pain from the fractures of right eighth ninth ribs.  Had vaccination x2, booster last week. Continue pain control incentive spirometry, O2 sats 98% on room air. Chest x-ray clear on 12/14. Per pharmacy, does not meet criteria for Mab infusion due to partially or fully vaccinated for COVID-19, asymptomatic with a cycle time> 32. Currently stable, O2 sats 99% on room air, asymptomatic. D-dimers is elevated, but trending down, Doppler  negative for DVT. Covid isolation expired 12/25.  Hypothyroidism: TSH elevated at 14, have increased her Synthroid from 75 to 100 mcg .  We will need to recheck TSH level in 4 to 6 weeks.  Urinary retention: Hypotension, for which she has required Foley insertion. Foley catheter placed on 12/27 by urology. Recommend leaving Foley catheter for 3 to 4 days for maximum bladder decompression and may discharge with the Foley catheter and follow-up with [facility] for voiding trial.    UTI:  Continue Rocephin.  Unfortunately urine culture is not collected prior to start of antibiotics.  She is afebrile.  Hypokalemia/hyponatremia: Resolved  Hyperlipidemia: Continue statin  I have seen and examined this patient myself. I have spent 34 minutes in her evaluation and care.  DVT prophylaxis: SCD/Lovenox  code Status: Full code Family Communication:  None present at bedside.    Disposition Plan: SNF  Shelden Raborn, DO [facility] Direct contact: see [email or web address]  7PM-7AM contact night coverage as above 03/30/2019, 5:27 PM  LOS: 14 days

## 2020-03-29 NOTE — Plan of Care (Signed)
  Problem: Education: Goal: Knowledge of General Education information will improve Description: Including pain rating scale, medication(s)/side effects and non-pharmacologic comfort measures Outcome: Progressing   Problem: Health Behavior/Discharge Planning: Goal: Ability to manage health-related needs will improve Outcome: Progressing   Problem: Clinical Measurements: Goal: Ability to maintain clinical measurements within normal limits will improve Outcome: Progressing Goal: Diagnostic test results will improve Outcome: Progressing Goal: Respiratory complications will improve Outcome: Progressing   Problem: Activity: Goal: Risk for activity intolerance will decrease Outcome: Progressing   Problem: Nutrition: Goal: Adequate nutrition will be maintained Outcome: Progressing   Problem: Coping: Goal: Level of anxiety will decrease Outcome: Progressing   Problem: Elimination: Goal: Will not experience complications related to bowel motility Outcome: Progressing Goal: Will not experience complications related to urinary retention Outcome: Progressing   Problem: Pain Managment: Goal: General experience of comfort will improve Outcome: Progressing   Problem: Safety: Goal: Ability to remain free from injury will improve Outcome: Progressing   Problem: Skin Integrity: Goal: Risk for impaired skin integrity will decrease Outcome: Progressing   

## 2020-03-29 NOTE — Progress Notes (Addendum)
      [address]       [place] [address]             [phone]      6 Days Post-Op Procedure(s) (LRB): CHEST TUBE INSERTION (Right) Subjective: No complaints this morning. Nursing is on the way to help her with using the bathroom.   Objective: Vital signs in last 24 hours: Temp:  [98.1 F (36.7 C)-98.8 F (37.1 C)] 98.8 F (37.1 C) (01/05 0617) Pulse Rate:  [78-89] 78 (01/05 0617) Resp:  [16-18] 16 (01/05 0617) BP: (113-130)/(60-71) 113/60 (01/05 0617) SpO2:  [95 %-97 %] 96 % (01/05 0617)     Intake/Output from previous day: 01/04 0701 - 01/05 0700 In: 420 [P.O.:420] Out: 1400 [Urine:1400] Intake/Output this shift: No intake/output data recorded.  General appearance: alert, cooperative and no distress Heart: regular rate and rhythm, S1, S2 normal, no murmur, click, rub or gallop Lungs: clear to auscultation bilaterally Abdomen: soft, non-tender; bowel sounds normal; no masses,  no organomegaly Extremities: extremities normal, atraumatic, no cyanosis or edema Wound: clean and dry  Lab Results: No results for input(s): WBC, HGB, HCT, PLT in the last 72 hours. BMET: Recent Labs    03/28/20 0119 03/29/20 0103  NA 139 139  K 3.8 3.5  CL 107 106  CO2 23 25  GLUCOSE 109* 104*  BUN 15 13  CREATININE 0.56 0.60  CALCIUM 8.5* 8.3*    PT/INR: No results for input(s): LABPROT, INR in the last 72 hours. ABG No results found for: PHART, HCO3, TCO2, ACIDBASEDEF, O2SAT CBG (last 3)  No results for input(s): GLUCAP in the last 72 hours.  Assessment/Plan: S/P Procedure(s) (LRB): CHEST TUBE INSERTION (Right)  1.CXR this morning shows continued moderate pleural effusion but significantly less than when she came in. No pneumothorax. 2. No shortness of breath this morning and pain is well controlled.  3. CV- BP low normal. NSR in the 80s 4. Pulm- tolerating room air with good oxygen saturation 5. Would recommend getting another H and H   Plan:  Chest tube was removed yesterday and patient is doing well this morning. CXR is stable. We will sign off and be available if needed.    LOS: 21 days    Sharlene Dory 03/29/2020

## 2020-03-30 DIAGNOSIS — S2241XA Multiple fractures of ribs, right side, initial encounter for closed fracture: Secondary | ICD-10-CM | POA: Diagnosis not present

## 2020-03-30 LAB — SARS CORONAVIRUS 2 (TAT 6-24 HRS): SARS Coronavirus 2: NEGATIVE

## 2020-03-30 MED ORDER — PANTOPRAZOLE SODIUM 40 MG PO TBEC: 40.0000 mg | DELAYED_RELEASE_TABLET | Freq: Two times a day (BID) | ORAL | 0 refills | Status: DC

## 2020-03-30 MED ORDER — QUETIAPINE FUMARATE 50 MG PO TABS: 50.0000 mg | ORAL_TABLET | Freq: Every day | ORAL | 0 refills | Status: DC

## 2020-03-30 MED ORDER — ENSURE ENLIVE PO LIQD: 237.0000 mL | Freq: Three times a day (TID) | ORAL | 12 refills | Status: DC

## 2020-03-30 MED ORDER — PROSOURCE PLUS PO LIQD: 30.0000 mL | Freq: Two times a day (BID) | ORAL | 0 refills | Status: DC

## 2020-03-30 MED ORDER — OXYCODONE HCL 5 MG PO TABS: 2.5000 mg | ORAL_TABLET | Freq: Four times a day (QID) | ORAL | 0 refills | Status: DC | PRN

## 2020-03-30 MED ORDER — ASPIRIN 81 MG PO TBEC: 81.0000 mg | DELAYED_RELEASE_TABLET | Freq: Every day | ORAL | 11 refills | Status: AC

## 2020-03-30 MED ORDER — WHITE PETROLATUM EX OINT
TOPICAL_OINTMENT | CUTANEOUS | Status: AC
  Administered 2020-03-30: 1
  Filled 2020-03-30: qty 28.35

## 2020-03-30 MED ORDER — SENNOSIDES-DOCUSATE SODIUM 8.6-50 MG PO TABS: 2.0000 | ORAL_TABLET | Freq: Two times a day (BID) | ORAL | 0 refills | Status: AC

## 2020-03-30 MED ORDER — ACETAMINOPHEN 325 MG PO TABS: 650.0000 mg | ORAL_TABLET | Freq: Three times a day (TID) | ORAL | 0 refills | Status: AC

## 2020-03-30 NOTE — Progress Notes (Signed)
Occupational Therapy Treatment Patient Details Name: Jordan Horne MRN: [id number] DOB: 1933-03-23 Today's Date: 03/30/2020    History of present illness 87 y.o. female admitted 03/07/20 after fall down steps hitting the back of her head with no LOC. Pt sustained R 8-9th rib fxs. Incidental (+) COVID-19; pt fully vaccinated. CXR clear. PMH includes mild dementia.   OT comments  Pt making good progress with functional goals. Pt very pleasant and cooperative. OT will continue to follow acutely to maximize level of function and safety  Follow Up Recommendations  SNF;Supervision/Assistance - 24 hour    Equipment Recommendations  Other (comment);Wheelchair cushion (measurements OT);Wheelchair (measurements OT) (TBD at SNF)    Recommendations for Other Services      Precautions / Restrictions Precautions Precautions: Fall Restrictions Weight Bearing Restrictions: No       Mobility Bed Mobility Overal bed mobility: Needs Assistance       Supine to sit: Min assist        Transfers Overall transfer level: Needs assistance Equipment used: Rolling walker (2 wheeled) Transfers: Sit to/from Stand Sit to Stand: Min assist Stand pivot transfers: Min assist            Balance Overall balance assessment: Needs assistance Sitting-balance support: Feet supported Sitting balance-Leahy Scale: Good     Standing balance support: Bilateral upper extremity supported;During functional activity Standing balance-Leahy Scale: Fair                             ADL either performed or assessed with clinical judgement   ADL Overall ADL's : Needs assistance/impaired Eating/Feeding: Sitting;Set up;Supervision/ safety Eating/Feeding Details (indicate cue type and reason): seated in recliner Grooming: Wash/dry hands;Wash/dry face;Min guard;Sitting Grooming Details (indicate cue type and reason): EOB Upper Body Bathing: Minimal assistance;Sitting Upper Body Bathing Details  (indicate cue type and reason): simulated Lower Body Bathing: Maximal assistance;Sitting/lateral leans;Sit to/from stand   Upper Body Dressing : Minimal assistance;Sitting Upper Body Dressing Details (indicate cue type and reason): Min A to doff/don clean gown seated in recliner     Toilet Transfer: Minimal assistance;Stand-pivot;RW;BSC;Cueing for safety;Cueing for sequencing   Toileting- Clothing Manipulation and Hygiene: Maximal assistance;Sit to/from stand       Functional mobility during ADLs: Rolling walker;Minimal assistance;Cueing for safety;Cueing for sequencing       Vision Patient Visual Report: No change from baseline     Perception     Praxis      Cognition Arousal/Alertness: Awake/alert Behavior During Therapy: WFL for tasks assessed/performed Overall Cognitive Status: History of cognitive impairments - at baseline Area of Impairment: Awareness;Safety/judgement;Problem solving;Following commands;Memory;Orientation                 Orientation Level: Disoriented to;Situation   Memory: Decreased short-term memory Following Commands: Follows one step commands inconsistently;Follows one step commands with increased time Safety/Judgement: Decreased awareness of safety;Decreased awareness of deficits   Problem Solving: Slow processing;Requires verbal cues;Requires tactile cues          Exercises     Shoulder Instructions       General Comments      Pertinent Vitals/ Pain       Pain Assessment: No/denies pain Pain Score: 0-No pain Pain Intervention(s): Monitored during session  Home Living Family/patient expects to be discharged to:: Skilled nursing facility Living Arrangements: Spouse/significant other  Prior Functioning/Environment              Frequency  Min 2X/week        Progress Toward Goals  OT Goals(current goals can now be found in the care plan section)  Progress  towards OT goals: Progressing toward goals  Acute Rehab OT Goals Patient Stated Goal: to feel better  Plan Discharge plan remains appropriate    Co-evaluation                 AM-PAC OT "6 Clicks" Daily Activity     Outcome Measure   Help from another person eating meals?: None Help from another person taking care of personal grooming?: A Little Help from another person toileting, which includes using toliet, bedpan, or urinal?: A Lot Help from another person bathing (including washing, rinsing, drying)?: A Lot Help from another person to put on and taking off regular upper body clothing?: A Little Help from another person to put on and taking off regular lower body clothing?: Total 6 Click Score: 15    End of Session Equipment Utilized During Treatment: Gait belt;Rolling walker;Other (comment) (BSC)  OT Visit Diagnosis: Unsteadiness on feet (R26.81);Other abnormalities of gait and mobility (R26.89);Muscle weakness (generalized) (M62.81);Other symptoms and signs involving cognitive function   Activity Tolerance Patient tolerated treatment well   Patient Left with bed alarm set;in chair;with chair alarm set   Nurse Communication Mobility status        Time: 2130-8657 OT Time Calculation (min): 26 min  Charges: OT General Charges $OT Visit: 1 Visit OT Treatments $Self Care/Home Management : 8-22 mins $Therapeutic Activity: 8-22 mins     Galen Manila 03/30/2020, 1:57 PM

## 2020-03-30 NOTE — Plan of Care (Signed)
  Problem: Education: Goal: Knowledge of General Education information will improve Description: Including pain rating scale, medication(s)/side effects and non-pharmacologic comfort measures Outcome: Progressing   Problem: Health Behavior/Discharge Planning: Goal: Ability to manage health-related needs will improve Outcome: Progressing   Problem: Clinical Measurements: Goal: Ability to maintain clinical measurements within normal limits will improve Outcome: Progressing Goal: Diagnostic test results will improve Outcome: Progressing Goal: Respiratory complications will improve Outcome: Progressing   Problem: Activity: Goal: Risk for activity intolerance will decrease Outcome: Progressing   Problem: Nutrition: Goal: Adequate nutrition will be maintained Outcome: Progressing   Problem: Coping: Goal: Level of anxiety will decrease Outcome: Progressing   Problem: Elimination: Goal: Will not experience complications related to bowel motility Outcome: Progressing Goal: Will not experience complications related to urinary retention Outcome: Progressing   Problem: Pain Managment: Goal: General experience of comfort will improve Outcome: Progressing   Problem: Safety: Goal: Ability to remain free from injury will improve Outcome: Progressing   Problem: Skin Integrity: Goal: Risk for impaired skin integrity will decrease Outcome: Progressing   

## 2020-03-30 NOTE — TOC Transition Note (Addendum)
Transition of Care [facility]) - CM/SW Discharge Note   Patient Details  Name: Jordan Horne MRN: [id number] Date of Birth: [birth year]  [facility] CM/SW Contact:  Jimmy Picket, LCSWA Phone Number: 03/30/2020, 11:26 AM   Clinical Narrative:     Pt will be discharging to [facility] via ptar. Pts husband Annice Pih notified. Pts covid results are pending. Ptar service has been requested.    Nurse to call report to [phone].  TOC will sign off at this time. Please re-consult if are any new needs.   Final next level of care: Skilled Nursing Facility Barriers to Discharge: Barriers Resolved   Patient Goals and CMS Choice Patient states their goals for this hospitalization and ongoing recovery are:: Rehab CMS Medicare.gov Compare Post Acute Care list provided to:: Patient Represenative (must comment) Choice offered to / list presented to : Spouse  Discharge Placement              Patient chooses bed at: [facility] [place] Patient to be transferred to facility by: Ptar Name of family member notified: Annice Pih, Husband Patient and family notified of of transfer: 03/30/20  Discharge Plan and Services   Discharge Planning Services: CM Consult Post Acute Care Choice: Skilled Nursing Facility                    HH Arranged: PT,OT,Nurse's Aide,Social Work [facility] Agency: [facility] Date [facility] Agency Contacted: 03/09/20 Time HH Agency Contacted: 1054 Representative spoke with at [facility] Agency: Wayne Both  Social Determinants of Health (SDOH) Interventions     Readmission Risk Interventions No flowsheet data found.   Jimmy Picket, Theresia Majors, [place] Clinical Social Worker [phone]

## 2020-03-30 NOTE — Discharge Summary (Signed)
Physician Discharge Summary  Jordan SizerBobbie R Vanderloop RUE:[id number] DOB: 01/25/1933 DOA: 03/07/2020  PCP: Dema SeverinYork, Regina F, NP  Admit date: 03/07/2020 Discharge date: 03/30/2020  Recommendations for Outpatient Follow-up:  1. Discharge to SNF for PT/OT 2. Follow up with PCP in 7-10 days after discharge from SNF. 3. Chest x-ray to be done at that visit and reported to PCP.  4. CBC and chemistry to be drawn on 04/06/2020 and reported to facility physician.   Contact information for follow-up providers    Dema SeverinYork, Regina F, NP. Schedule an appointment as soon as possible for a visit.   Contact information: [address] [place] [id number] [phone]            Contact information for after-discharge care    Destination    [facility] .   Service: Skilled Nursing Contact information: [address] [place] [phone]                 Discharge Diagnoses: Principal diagnosis is #1 1. Multiple rib fractures on the right. 2. Right hemothorax 3. Moderate right pleural effusion 4. TME 5. Dementia 6. COVID-19 7. Hypothyroidism 8. UTI 9. Hypokalemia 10. Hyponatremia 11. Hyperlipidemia  Discharge Condition: Fair  Disposition: SNF  Diet recommendation: Heart healthy  Filed Weights   03/10/20 0233  Weight: 84.4 kg    History of present illness: Jordan Horne is a 87 y.o. female with medical history significant for mild dementia, HLD, hypothyroidism who presents after a fall at home. She was walking at home today when she fell down 3 steps at about 4:30 PM. Patient states she just tripped and fell. Patient states she hit the back of her head and landed on her right side. She is having moderate to severe pain in the back of her head and neck. Patient states she is having pain also on the right side of her ribs. She did not take anything for the pain at home.She denies any shortness of breath. No abdominal  pain. She did not have LOC. No seizure activity reported.   Review of Systems:  General: Reports generalized pain in neck, back and right side. Denies weakness, fever, chills, weight loss, night sweats. Denies change in appetite HENT: Denies headache, denies change in hearing, tinnitus.  Denies nasal congestion or bleeding.  Denies sore throat, sores in mouth.  Denies difficulty swallowing Eyes: Denies blurry vision, pain in eye, drainage.  Denies discoloration of eyes. Neck: Denies pain.  Denies swelling.  Denies pain with movement. Cardiovascular: Denies chest pain, palpitations.  Denies edema.  Denies orthopnea Respiratory: Denies shortness of breath, cough.  Denies wheezing.  Denies sputum production Gastrointestinal: Denies abdominal pain, swelling.  Denies nausea, vomiting, diarrhea.  Denies melena.  Denies hematemesis. Musculoskeletal: Denies limitation of movement.  Denies deformity or swelling.  Genitourinary: Denies pelvic pain.  Denies urinary frequency or hesitancy.  Denies dysuria.  Skin: Denies rash.  Denies petechiae, purpura, ecchymosis. Neurological: Denies headache.  Denies syncope.  Denies seizure activity.  Denies weakness or paresthesia.  Denies slurred speech, drooping face.  Denies visual change. Psychiatric: Denies depression, anxiety.  Denies suicidal thoughts or ideation.  Denies hallucinations.  Hospital Course:  Per husband, patient has completed both Covid vaccinations x2 and had received booster last week as well.  The patient underwent thoracentesis on  due to large right pleural effusion seen on CT performed on 03/21/2020. Procedure was stopped when fluid drawn out was grossly bloody. This is  as per documentation provided by Elwin MochaLouk, Alexandra, PA-C which served as my only notification.  Pulmonology has been consulted. TCTS has also been consulted. CT placed on 03/24/2020. Follow up CXR demonstrates resolution of the hemothorax. TCTS has removed the patient's  chest tube on 03/28/2020. Per husband, patient has completed both Covid vaccinations x2 and had received booster last week as well.  The patient underwent thoracentesis on 03/23/2021 due to large right pleural effusion seen on CT performed on 03/21/2020. Procedure was stopped when fluid drawn out was grossly bloody. This is as per documentation provided by Elwin MochaLouk, Alexandra, PA-C which served as this writer's only notification.  Pulmonology has been consulted. TCTS has also been consulted. CT placed on 03/24/2020. Follow up CXR demonstrates resolution of the hemothorax. TCTS has removed the patient's chest tube on 03/28/2020.  The patient will be discharged to SNF on 03/30/2020.  Today's assessment: S: The patient is resting comfortably. No new complaints. O: Vitals:  Vitals:   03/29/20 2117 03/30/20 0500  BP: 126/65 132/61  Pulse: 85 80  Resp: 14 16  Temp: 98.7 F (37.1 C) 98.4 F (36.9 C)  SpO2: 96% 95%   Exam:  Constitutional:   The patient is awake, alert, and oriented x 3. No acute distress. Respiratory:   No increased work of breathing.  No wheezes, rales, or rhonchi  No tactile fremitus Cardiovascular:   Regular rate and rhythm  No murmurs, ectopy, or gallups.  No lateral PMI. No thrills.  Dullness to percussion in the lower 1/3 - 1/2 of the l Abdomen:   Abdomen is soft, non-tender, non-distended  No hernias, masses, or organomegaly  Normoactive bowel sounds.  Musculoskeletal:   No cyanosis, clubbing, or edema Skin:   No rashes, lesions, ulcers  palpation of skin: no induration or nodules Neurologic:   CN 2-12 intact  Sensation all 4 extremities intact Psychiatric:   Mental status ? Mood, affect appropriate ? Orientation to person, place, time   judgment and insight appear intact  Discharge Instructions  Discharge Instructions    Activity as tolerated - No restrictions   Complete by: As directed    Call MD for:  difficulty breathing,  headache or visual disturbances   Complete by: As directed    Call MD for:  extreme fatigue   Complete by: As directed    Call MD for:  temperature >100.4   Complete by: As directed    Diet - low sodium heart healthy   Complete by: As directed    Discharge instructions   Complete by: As directed    Discharge to SNF for PT/OT Follow up with PCP in 7-10 days after discharge from SNF. Chest x-ray to be done at that visit and reported to PCP.  CBC and chemistry to be drawn on 04/06/2020 and reported to facility physician.   Increase activity slowly   Complete by: As directed    No wound care   Complete by: As directed      Allergies as of 03/30/2020      Reactions   Betadine [povidone Iodine] Itching      Medication List    STOP taking these medications   meloxicam 7.5 MG tablet Commonly known as: MOBIC   valACYclovir 1000 MG tablet Commonly known as: VALTREX     TAKE these medications   (feeding supplement) PROSource Plus liquid Take 30 mLs by mouth 2 (two) times daily between meals.   feeding supplement Liqd Take 237 mLs by mouth 3 (three)  times daily between meals.   acetaminophen 325 MG tablet Commonly known as: TYLENOL Take 2 tablets (650 mg total) by mouth 3 (three) times daily.   aspirin 81 MG EC tablet Take 1 tablet (81 mg total) by mouth daily. Swallow whole. Start taking on: March 31, 2020   divalproex 125 MG DR tablet Commonly known as: DEPAKOTE Take 125 mg by mouth daily.   levothyroxine 75 MCG tablet Commonly known as: SYNTHROID Take 75 mcg by mouth daily.   nitroGLYCERIN 0.4 MG SL tablet Commonly known as: NITROSTAT Place 0.4 mg under the tongue every 5 (five) minutes as needed for chest pain (max 3 doses).   nitroGLYCERIN 0.4 MG SL tablet Commonly known as: NITROSTAT Place 1 tablet (0.4 mg total) under the tongue every 5 (five) minutes as needed for chest pain.   oxyCODONE 5 MG immediate release tablet Commonly known as: Oxy  IR/ROXICODONE Take 0.5 tablets (2.5 mg total) by mouth every 6 (six) hours as needed for severe pain or breakthrough pain.   pantoprazole 40 MG tablet Commonly known as: PROTONIX Take 1 tablet (40 mg total) by mouth 2 (two) times daily before a meal. What changed: when to take this   QUEtiapine 50 MG tablet Commonly known as: SEROQUEL Take 1 tablet (50 mg total) by mouth at bedtime.   rosuvastatin 10 MG tablet Commonly known as: CRESTOR Take 10 mg by mouth daily.   senna-docusate 8.6-50 MG tablet Commonly known as: Senokot-S Take 2 tablets by mouth 2 (two) times daily. Hold for more than 2 large BM's in 24 hours. Restart for no BM for 24 hours.   SYSTANE CONTACTS OP Place 1 drop into both eyes daily.      Allergies  Allergen Reactions  . Betadine [Povidone Iodine] Itching    The results of significant diagnostics from this hospitalization (including imaging, microbiology, ancillary and laboratory) are listed below for reference.    Significant Diagnostic Studies: DG Chest 1 View  Result Date: 03/22/2020 CLINICAL DATA:  Status post right thoracentesis. EXAM: CHEST  1 VIEW COMPARISON:  March 07, 2020. FINDINGS: Stable cardiomegaly. No pneumothorax is noted. Increased right basilar atelectasis or infiltrate is noted with associated pleural effusion. Mild left basilar subsegmental atelectasis is noted. Bony thorax is unremarkable. IMPRESSION: Increased right basilar atelectasis or infiltrate is noted with associated pleural effusion. No pneumothorax is noted. Mild left basilar subsegmental atelectasis is noted. Aortic Atherosclerosis (ICD10-I70.0). Electronically Signed   By: Lupita Raider M.D.   On: 03/22/2020 13:51   DG Ribs Unilateral W/Chest Right  Result Date: 03/07/2020 CLINICAL DATA:  Fall, right chest pain EXAM: RIGHT RIBS AND CHEST - 3+ VIEW COMPARISON:  None. FINDINGS: Single view radiograph of the chest and two view radiograph of the right ribs demonstrates acute  minimally displaced fractures of the right eighth and ninth ribs posteriorly. Minimal left basilar atelectasis or infiltrate. Lungs are otherwise clear. No pneumothorax or pleural effusion. Cardiac size is at the upper limits of normal. Pulmonary vascularity is normal. IMPRESSION: Acute minimally displaced right 8, 9 rib fractures. No pneumothorax. Electronically Signed   By: Helyn Numbers MD   On: 03/07/2020 20:05   DG Thoracic Spine 2 View  Result Date: 03/07/2020 CLINICAL DATA:  Fall, back pain EXAM: THORACIC SPINE 2 VIEWS COMPARISON:  04/24/2018 FINDINGS: Two view radiograph thoracic spine demonstrates normal thoracic kyphosis. Mild thoracic dextroscoliosis, apex right at T5, unchanged. No acute fracture or listhesis of the thoracic spine. Vertebral body height has been preserved. There  is diffuse intervertebral disc space narrowing and endplate remodeling throughout the thoracic spine in keeping with changes of diffuse severe degenerative disc disease. The paraspinal soft tissues are unremarkable. Incidental note is made of a small hiatal hernia. IMPRESSION: No acute fracture or listhesis. Electronically Signed   By: Helyn Numbers MD   On: 03/07/2020 20:02   DG Lumbar Spine Complete  Result Date: 03/07/2020 CLINICAL DATA:  Fall, back pain EXAM: LUMBAR SPINE - COMPLETE 4+ VIEW COMPARISON:  None. FINDINGS: Five view radiograph lumbar spine. Normal lumbar lordosis. No acute fracture or listhesis of the lumbar spine. Vertebral body height has been preserved. There is diffuse severe intervertebral disc space narrowing and endplate remodeling with multilevel vacuum disc phenomena in keeping with changes of diffuse severe degenerative disc disease throughout the lumbar spine. Oblique views demonstrate no evidence of pars defect. Paraspinal soft tissues are unremarkable. IMPRESSION: No acute fracture or listhesis. Electronically Signed   By: Helyn Numbers MD   On: 03/07/2020 20:06   DG Abd 1  View  Result Date: 03/15/2020 CLINICAL DATA:  Nausea and vomiting EXAM: ABDOMEN - 1 VIEW COMPARISON:  08/13/2018 FINDINGS: Bowel gas pattern is unremarkable. Cholecystectomy clips. No acute osseous abnormality. IMPRESSION: Normal bowel gas pattern. Electronically Signed   By: Guadlupe Spanish M.D.   On: 03/15/2020 10:41   CT HEAD WO CONTRAST  Result Date: 03/15/2020 CLINICAL DATA:  Delirium EXAM: CT HEAD WITHOUT CONTRAST TECHNIQUE: Contiguous axial images were obtained from the base of the skull through the vertex without intravenous contrast. COMPARISON:  03/07/2020 FINDINGS: Clinical note: Examination is mildly degraded by patient motion. Brain: No evidence of acute infarction, hemorrhage, hydrocephalus, extra-axial collection or mass lesion/mass effect. Mild low-density changes within the periventricular and subcortical white matter compatible with chronic microvascular ischemic change. Mild diffuse cerebral volume loss. Vascular: Atherosclerotic calcifications involving the large vessels of the skull base. No unexpected hyperdense vessel. Skull: No evidence of calvarial fracture. Sinuses/Orbits: No acute finding. Other: None. IMPRESSION: 1. No acute intracranial findings. 2. Chronic microvascular ischemic change and cerebral volume loss. Electronically Signed   By: Duanne Guess D.O.   On: 03/15/2020 16:50   CT Head Wo Contrast  Result Date: 03/07/2020 CLINICAL DATA:  Head trauma EXAM: CT HEAD WITHOUT CONTRAST TECHNIQUE: Contiguous axial images were obtained from the base of the skull through the vertex without intravenous contrast. COMPARISON:  CT brain 08/12/2019 FINDINGS: Brain: No acute territorial infarction, hemorrhage, or intracranial mass. Mild atrophy. Mild chronic small vessel ischemic changes of the white matter. Chronic appearing lacunar infarcts in the basal ganglia. Stable punctate calcifications in the left periventricular white matter. Stable ventricle size. Vascular: No hyperdense  vessels.  Carotid vascular calcification Skull: Normal. Negative for fracture or focal lesion. Sinuses/Orbits: Mild mucosal thickening in the sinuses Other: None IMPRESSION: 1. No CT evidence for acute intracranial abnormality. 2. Atrophy and mild chronic small vessel ischemic changes of the white matter. Electronically Signed   By: Jasmine Pang M.D.   On: 03/07/2020 20:21   CT chest without contrast  Result Date: 03/27/2020 CLINICAL DATA:  Hemothorax. EXAM: CT CHEST WITHOUT CONTRAST TECHNIQUE: Multidetector CT imaging of the chest was performed following the standard protocol without IV contrast. COMPARISON:  March 21, 2020. FINDINGS: Cardiovascular: Atherosclerosis of thoracic aorta is noted without aneurysm formation. Mild cardiomegaly is noted. Minimal pericardial effusion is noted. Mediastinum/Nodes: Moderate sliding-type hiatal hernia is noted. No thyroid abnormality is noted. No adenopathy is noted. Lungs/Pleura: No definite pneumothorax is noted. Moderate right pleural  effusion is noted with adjacent subsegmental atelectasis of the right lower lobe. Chest tube is seen entering right lateral chest wall with distal tip within the right major fissure directed toward right hilar region. Mild left basilar subsegmental atelectasis is noted. Upper Abdomen: No acute abnormality. Musculoskeletal: Displaced fractures are seen involving the posterior portions of the right sixth through tenth ribs as noted on prior exam. IMPRESSION: 1. Moderate right pleural effusion is noted with adjacent subsegmental atelectasis of the right lower lobe. Chest tube is seen entering right lateral chest wall with distal tip within the right major fissure directed toward right hilar region. 2. Mild left basilar subsegmental atelectasis is noted. 3. Moderate sliding-type hiatal hernia. 4. Displaced fractures are seen involving the posterior portions of the right sixth through tenth ribs as noted on prior exam. 5. Aortic  atherosclerosis. Aortic Atherosclerosis (ICD10-I70.0). Electronically Signed   By: Lupita Raider M.D.   On: 03/27/2020 16:29   CT Cervical Spine Wo Contrast  Result Date: 03/07/2020 CLINICAL DATA:  Trauma EXAM: CT CERVICAL SPINE WITHOUT CONTRAST TECHNIQUE: Multidetector CT imaging of the cervical spine was performed without intravenous contrast. Multiplanar CT image reconstructions were also generated. COMPARISON:  08/12/2019 FINDINGS: Alignment: Stable alignment with trace retrolisthesis C5 on C6 and trace anterolisthesis C6 on C7. Facet alignment is maintained. Skull base and vertebrae: No acute fracture. No primary bone lesion or focal pathologic process. Soft tissues and spinal canal: No prevertebral fluid or swelling. No visible canal hematoma. Disc levels: Multiple level degenerative changes with moderate to marked disease at C4-C5 and C5-C6. Facet degenerative change at multiple levels. Upper chest: Negative.  No appreciable thyroid tissue identified. Other: None IMPRESSION: Stable alignment of the cervical spine with degenerative changes. No acute osseous abnormality. Electronically Signed   By: Jasmine Pang M.D.   On: 03/07/2020 20:26   DG Chest Port 1 View  Result Date: 03/29/2020 CLINICAL DATA:  Chest tube removal. EXAM: PORTABLE CHEST 1 VIEW COMPARISON:  03/28/2020.  CT 03/27/2020. FINDINGS: Interim removal of right chest tube. No pneumothorax. Persistent moderate right pleural effusion and atelectatic changes in the right lung. Mild left base subsegmental atelectasis. Heart size stable. Thoracic spine scoliosis. Multiple right rib fractures again noted. IMPRESSION: 1. Interim removal of right chest tube. No pneumothorax. 2. Persistent moderate right pleural effusion and atelectatic changes in the right lung. Mild left base subsegmental atelectasis. 3. Multiple right rib fractures again noted. Electronically Signed   By: Maisie Fus  Register   On: 03/29/2020 06:08   DG Chest Port 1  View  Result Date: 03/28/2020 CLINICAL DATA:  Hemothorax.  Chest tube EXAM: PORTABLE CHEST 1 VIEW COMPARISON:  Yesterday FINDINGS: Right chest tube in similar position. No visible pneumothorax or increasing pleural fluid. Hazy right base opacity is attributed atelectasis and pleural fluid based on CT yesterday. Cardiomegaly and aortic tortuosity. No pneumothorax. IMPRESSION: Right chest tube without visible pneumothorax or increasing pleural fluid. Electronically Signed   By: Marnee Spring M.D.   On: 03/28/2020 06:11   DG CHEST PORT 1 VIEW  Result Date: 03/27/2020 CLINICAL DATA:  Hemothorax on the right. EXAM: PORTABLE CHEST 1 VIEW COMPARISON:  03/26/2020 FINDINGS: Right chest tube remains in place. No evidence for right-sided pneumothorax. Small right pleural fluid collection evident. The cardio pericardial silhouette is enlarged. Multiple right rib fractures again noted. IMPRESSION: No substantial change. Right chest tube remains in place without evidence for right-sided pneumothorax. Electronically Signed   By: Kennith Center M.D.   On: 03/27/2020 05:58  DG CHEST PORT 1 VIEW  Result Date: 03/26/2020 CLINICAL DATA:  Right rib fractures and right chest tube EXAM: PORTABLE CHEST 1 VIEW COMPARISON:  Chest radiograph from one day prior. FINDINGS: Lower right chest tube has been slightly retracted in the interval with side port near the periphery of the right pleural space. Stable cardiomediastinal silhouette with top-normal heart size. No pneumothorax. Probable trace bilateral pleural effusions, stable. Patchy bibasilar lung opacities, similar. Multiple posterolateral right mid rib fractures again noted. IMPRESSION: 1. No pneumothorax. Right chest tube has been slightly retracted in the interval, with side port near the periphery of the right pleural space. 2. Probable stable trace bilateral pleural effusions. 3. Stable patchy bibasilar lung opacities. Electronically Signed   By: Delbert Phenix M.D.   On:  03/26/2020 08:18   DG Chest Port 1 View  Result Date: 03/25/2020 CLINICAL DATA:  87 year old female day 2 status post right chest tube placement 4 hemothorax. Right 6th through 10th rib fractures. EXAM: PORTABLE CHEST 1 VIEW COMPARISON:  Portable chest 03/24/2020 and earlier. FINDINGS: Portable AP upright view at 0726 hours. Stable right chest tube position, side hole deep to the chest wall. Largely drained right side hemothorax demonstrated by CT on 03/21/2020. No pneumothorax identified. Lower lung volumes. Stable cardiac size and mediastinal contours. Continued patchy left lung opacity which appeared mostly related to atelectasis by CT. Multilevel right posterolateral rib fractures. Negative visible bowel gas pattern. IMPRESSION: 1. Stable right chest tube. Largely drained right side hemothorax and no pneumothorax identified. 2. Continued left lung base opacity favored due to atelectasis. 3. Right rib fractures. Electronically Signed   By: Odessa Fleming M.D.   On: 03/25/2020 09:23   DG Chest Port 1 View  Result Date: 03/24/2020 CLINICAL DATA:  Right hemothorax EXAM: PORTABLE CHEST 1 VIEW COMPARISON:  03/23/2020, CT 03/21/2020 FINDINGS: Multiple acute right rib fractures are again identified. Large bore right chest tube is unchanged overlying the peripheral right lung base. Mild atelectasis or infiltrate at the right lung base is unchanged. No pneumothorax or pleural effusion. Cardiac size within normal limits. Pulmonary vascularity is normal. IMPRESSION: Right large bore chest tube unchanged. No pneumothorax pleural effusion. Multiple acute right rib fractures again noted. Mild right basilar atelectasis or infiltrate. Electronically Signed   By: Helyn Numbers MD   On: 03/24/2020 06:17   DG Chest Port 1 View  Result Date: 03/23/2020 CLINICAL DATA:  Chest tube placement. EXAM: PORTABLE CHEST 1 VIEW COMPARISON:  Radiograph yesterday.  CT 03/21/2020 FINDINGS: Placement of right-sided chest tube with tip in  the region of the right mid lung. No visualized pneumothorax. Small amount of subcutaneous emphysema in the right lateral chest wall. Hazy right greater than left lung base opacities with slight improvement from prior exam. Multiple displaced right rib fractures again seen. No pulmonary edema or new airspace disease. Aortic atherosclerosis with stable mediastinal contours. IMPRESSION: 1. Placement of right-sided chest tube with tip projecting over the right mid lung. No visualized pneumothorax. 2. Hazy right greater than left lung base opacities, improving from prior exam, likely combination of pleural fluid and atelectasis. 3. Displaced right rib fractures. Electronically Signed   By: Narda Rutherford M.D.   On: 03/23/2020 18:06   CT ANGIO CHEST AORTA W/CM & OR WO/CM  Result Date: 03/21/2020 CLINICAL DATA:  Chest pain.  Aortic dissection suspected. EXAM: CT ANGIOGRAPHY CHEST WITH CONTRAST TECHNIQUE: Multidetector CT imaging of the chest was performed using the standard protocol during bolus administration of intravenous contrast. Multiplanar CT  image reconstructions and MIPs were obtained to evaluate the vascular anatomy. CONTRAST:  2mL OMNIPAQUE IOHEXOL 350 MG/ML SOLN COMPARISON:  Chest and rib radiographs 03/07/2020 FINDINGS: Cardiovascular: Heart is mildly enlarged. Artery calcifications present. No significant pericardial effusion is present. Pulmonary arteries are within normal limits. Atherosclerotic calcifications are present at the aortic arch and at the wall the descending thoracic aorta no dissection is present. No aneurysm or focal stenosis is present. Calcifications are present the origins the great vessels, celiac artery, and the superior mesenteric artery without significant stenosis. Mediastinum/Nodes: No significant mediastinal, hilar, or axillary adenopathy is present. Esophagus is within limits. The thoracic inlet is unremarkable. Lungs/Pleura: Large right pleural effusion is present. Much  of the right lower lobe is collapsed. Significant left lower lobe and posterior lingular airspace collapse present as well. Airways are patent. Dependent atelectasis is present along the left major fissure. Upper Abdomen: Prominent hiatal hernia is present. Surgical clips are present at gallbladder fossa. Visualized upper abdomen is otherwise unremarkable. Musculoskeletal: Right-sided rib fractures are present in the sixth through tenth rib. Displacement is greatest at the ninth rib. This is adjacent to the fusion. No significant left-sided rib fractures are present. Vertebral body heights are maintained. Exaggerated thoracic kyphosis is present. Sternum is intact. Review of the MIP images confirms the above findings. IMPRESSION: 1. Right-sided rib fractures in the sixth through tenth rib. Displacement is greatest at the ninth rib. 2. Large right pleural effusion with near complete collapse of the right lower lobe. 3. Significant left lower lobe and posterior lingular airspace collapse as well. 4. Mild cardiomegaly without failure. 5. Prominent hiatal hernia. 6. Coronary artery disease. 7. Aortic Atherosclerosis (ICD10-I70.0). Electronically Signed   By: Marin Roberts M.D.   On: 03/21/2020 12:12   VAS [place] LOWER EXTREMITY VENOUS (DVT)  Result Date: 03/13/2020  Lower Venous DVT Study Other Indications: Covid, Elevated D-Dimer. Comparison Study: No previous exam Performing Technologist: Clint Guy RVT  Examination Guidelines: A complete evaluation includes B-mode imaging, spectral Doppler, color Doppler, and power Doppler as needed of all accessible portions of each vessel. Bilateral testing is considered an integral part of a complete examination. Limited examinations for reoccurring indications may be performed as noted. The reflux portion of the exam is performed with the patient in reverse Trendelenburg.  +---------+---------------+---------+-----------+----------+--------------+ RIGHT     CompressibilityPhasicitySpontaneityPropertiesThrombus Aging +---------+---------------+---------+-----------+----------+--------------+ CFV      Full           Yes      Yes                                 +---------+---------------+---------+-----------+----------+--------------+ SFJ      Full                                                        +---------+---------------+---------+-----------+----------+--------------+ FV Prox  Full                                                        +---------+---------------+---------+-----------+----------+--------------+ FV Mid   Full                                                        +---------+---------------+---------+-----------+----------+--------------+  FV DistalFull                                                        +---------+---------------+---------+-----------+----------+--------------+ PFV      Full                                                        +---------+---------------+---------+-----------+----------+--------------+ POP      Full           Yes      Yes                                 +---------+---------------+---------+-----------+----------+--------------+ PTV      Full                                                        +---------+---------------+---------+-----------+----------+--------------+ PERO     Full                                                        +---------+---------------+---------+-----------+----------+--------------+   +---------+---------------+---------+-----------+----------+--------------+ LEFT     CompressibilityPhasicitySpontaneityPropertiesThrombus Aging +---------+---------------+---------+-----------+----------+--------------+ CFV      Full           Yes      Yes                                 +---------+---------------+---------+-----------+----------+--------------+ SFJ      Full                                                         +---------+---------------+---------+-----------+----------+--------------+ FV Prox  Full                                                        +---------+---------------+---------+-----------+----------+--------------+ FV Mid   Full                                                        +---------+---------------+---------+-----------+----------+--------------+ FV DistalFull                                                        +---------+---------------+---------+-----------+----------+--------------+  PFV      Full                                                        +---------+---------------+---------+-----------+----------+--------------+ POP      Full           Yes      Yes                                 +---------+---------------+---------+-----------+----------+--------------+ PTV      Full                                                        +---------+---------------+---------+-----------+----------+--------------+ PERO     Full                                                        +---------+---------------+---------+-----------+----------+--------------+     Summary: RIGHT: - There is no evidence of deep vein thrombosis in the lower extremity.  - No cystic structure found in the popliteal fossa.  LEFT: - There is no evidence of deep vein thrombosis in the lower extremity.  - No cystic structure found in the popliteal fossa.  *See table(s) above for measurements and observations. Electronically signed by Deitra Mayo MD on 03/13/2020 at 4:35:46 PM.    Final    IR THORACENTESIS ASP PLEURAL SPACE W/IMG GUIDE  Result Date: 03/22/2020 INDICATION: Patient with history of recent fall with multiple right rib fractures, dyspnea, and right pleural effusion. Request made for diagnostic and therapeutic right thoracentesis. EXAM: ULTRASOUND GUIDED DIAGNOSTIC AND THERAPEUTIC RIGHT THORACENTESIS MEDICATIONS: 10 mL 1% lidocaine  COMPLICATIONS: None immediate. PROCEDURE: An ultrasound guided thoracentesis was thoroughly discussed with the patient and questions answered. The benefits, risks, alternatives and complications were also discussed. The patient understands and wishes to proceed with the procedure. Written consent was obtained. Ultrasound was performed to localize and mark an adequate pocket of fluid in the right chest. The area was then prepped and draped in the normal sterile fashion. 1% Lidocaine was used for local anesthesia. Under ultrasound guidance a 6 Fr Safe-T-Centesis catheter was introduced. Thoracentesis was performed. The catheter was removed and a dressing applied. FINDINGS: A total of approximately 10 mL of dark red fluid was removed. Procedure was stopped after 10 mL as pleural effusion is a hemothorax and catheter was clotted by drainage. Samples were sent to the laboratory as requested by the clinical team. IMPRESSION: Successful ultrasound guided right thoracentesis yielding 10 mL of pleural fluid. Read by: Earley Abide, PA-C Electronically Signed   By: Aletta Edouard M.D.   On: 03/22/2020 13:56    Microbiology: Recent Results (from the past 240 hour(s))  Body fluid culture     Status: None   Collection Time: 03/22/20  1:33 PM   Specimen: PATH Cytology Pleural fluid  Result Value Ref Range Status   Specimen Description PLEURAL FLUID  Final   Special Requests NONE  Final   Gram Stain   Final    RARE WBC PRESENT, PREDOMINANTLY MONONUCLEAR NO ORGANISMS SEEN    Culture   Final    NO GROWTH 3 DAYS Performed at [facility], [address] [place], [place] [address]    Report Status 03/26/2020 FINAL  Final  SARS CORONAVIRUS 2 (TAT 6-24 HRS) Nasopharyngeal Nasopharyngeal Swab     Status: None   Collection Time: 03/30/20  4:41 AM   Specimen: Nasopharyngeal Swab  Result Value Ref Range Status   SARS Coronavirus 2 NEGATIVE NEGATIVE Final    Comment: (NOTE) SARS-CoV-2 target nucleic acids  are NOT DETECTED.  The SARS-CoV-2 RNA is generally detectable in upper and lower respiratory specimens during the acute phase of infection. Negative results do not preclude SARS-CoV-2 infection, do not rule out co-infections with other pathogens, and should not be used as the sole basis for treatment or other patient management decisions. Negative results must be combined with clinical observations, patient history, and epidemiological information. The expected result is Negative.  Fact Sheet for Patients: HairSlick.no  Fact Sheet for Healthcare Providers: quierodirigir.com  This test is not yet approved or cleared by the [place] FDA and  has been authorized for detection and/or diagnosis of SARS-CoV-2 by FDA under an Emergency Use Authorization (EUA). This EUA will remain  in effect (meaning this test can be used) for the duration of the COVID-19 declaration under Se ction 564(b)(1) of the Act, 21 U.S.C. section 360bbb-3(b)(1), unless the authorization is terminated or revoked sooner.  Performed at [facility], [address], [place], [place] [address]      Labs: Basic Metabolic Panel: Recent Labs  Lab 03/25/20 0233 03/26/20 0143 03/27/20 0256 03/28/20 0119 03/29/20 0103  NA 140 140 140 139 139  K 3.4* 3.9 3.7 3.8 3.5  CL 106 110 104 107 106  CO2 25 24 24 23 25   GLUCOSE 113* 106* 101* 109* 104*  BUN 18 18 18 15 13   CREATININE 0.72 0.67 0.68 0.56 0.60  CALCIUM 8.3* 8.1* 8.7* 8.5* 8.3*   Liver Function Tests: Recent Labs  Lab 03/26/20 0143 03/27/20 0256 03/28/20 0119 03/29/20 0103  AST 63* 141* 350* 179*  ALT 69* 146* 366* 271*  ALKPHOS 71 105 140* 118  BILITOT 1.1 0.6 1.0 0.8  PROT 5.4* 6.1* 6.4* 5.4*  ALBUMIN 2.2* 2.4* 2.5* 2.1*   No results for input(s): LIPASE, AMYLASE in the last 168 hours. No results for input(s): AMMONIA in the last 168 hours. CBC: Recent Labs  Lab 03/24/20 0102  03/24/20 1431 03/25/20 0233 03/26/20 0143  WBC 10.7*  --  9.3 9.8  NEUTROABS  --   --   --  6.8  HGB 10.1* 10.8* 9.9* 9.8*  HCT 32.0* 32.7* 31.1* 30.7*  MCV 100.0  --  99.7 99.4  PLT 360  --  351 315   Cardiac Enzymes: No results for input(s): CKTOTAL, CKMB, CKMBINDEX, TROPONINI in the last 168 hours. BNP: BNP (last 3 results) Recent Labs    03/09/20 0254 03/10/20 0720 03/11/20 0005  BNP 148.2* 155.3* 106.3*    ProBNP (last 3 results) No results for input(s): PROBNP in the last 8760 hours.  CBG: Recent Labs  Lab 03/23/20 2034  GLUCAP 96    Principal Problem:   Multiple fractures of ribs, right side, initial encounter for closed fracture Active Problems:   Intractable pain   Dementia (HCC)   Fall at home, initial encounter   Elevated blood  pressure reading   Prolonged QT interval   Right rib fracture   Time coordinating discharge: 38 minutes.  Signed:        Wilfred Siverson, DO [facility]  03/30/2020, 1:40 PM

## 2020-06-02 ENCOUNTER — Ambulatory Visit: Payer: Medicare Other | Admitting: Cardiology

## 2020-08-23 IMAGING — CT CT CERVICAL SPINE W/O CM
3 series · 14 of 33 positions shown, 17 images · non-contrast
Comparison: None.

CLINICAL DATA: Fall yesterday, head trauma, headache and neck pain

EXAM:
CT HEAD WITHOUT CONTRAST
CT CERVICAL SPINE WITHOUT CONTRAST
TECHNIQUE: Multidetector CT imaging of the head and cervical spine was
performed following the standard protocol without intravenous
contrast. Multiplanar CT image reconstructions of the cervical spine
were also generated.

[Series 5: c spine soft · axial · 0.29mm/px · z∈[+678,+808]mm · 6 of 86 slices shown, 8 images]
[im 14/86  soft-tissue]
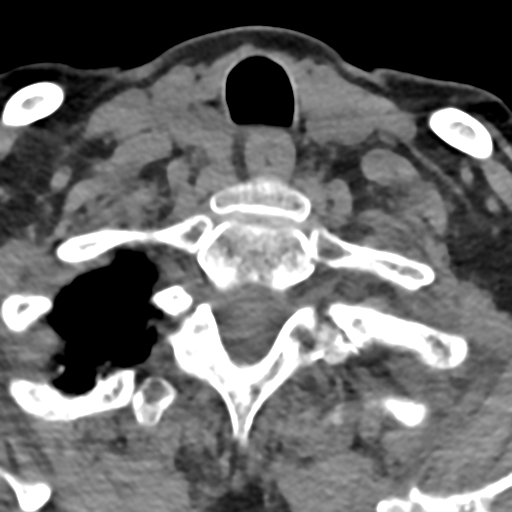
[im 14/86  bone]
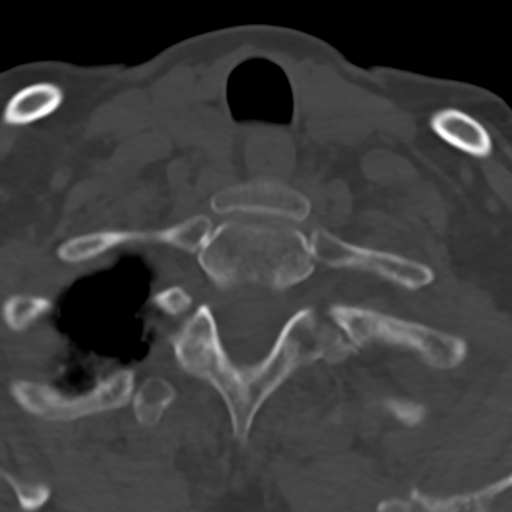
[im 27/86  bone]
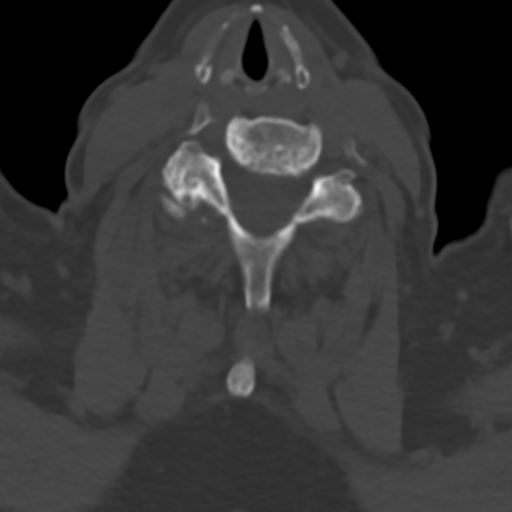
[im 40/86  bone]
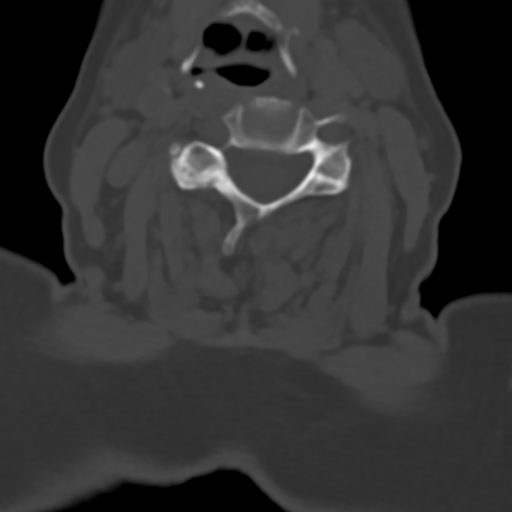
[im 53/86  bone]
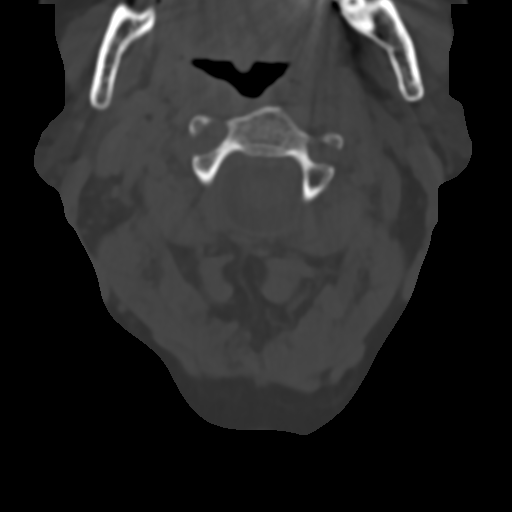
[im 66/86  soft-tissue]
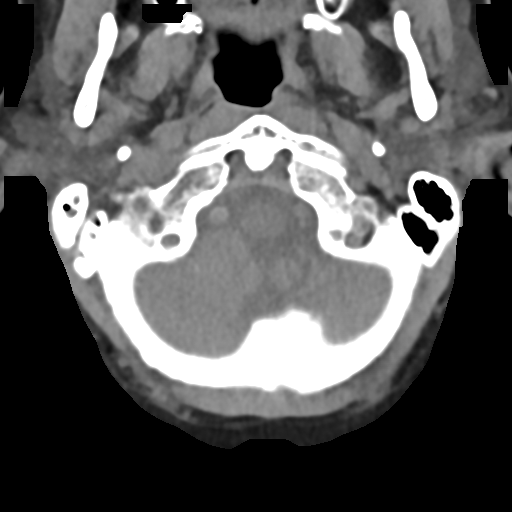
[im 66/86  bone]
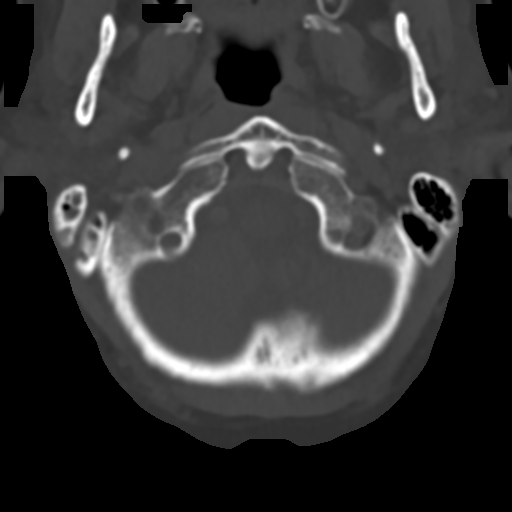
[im 79/86  bone]
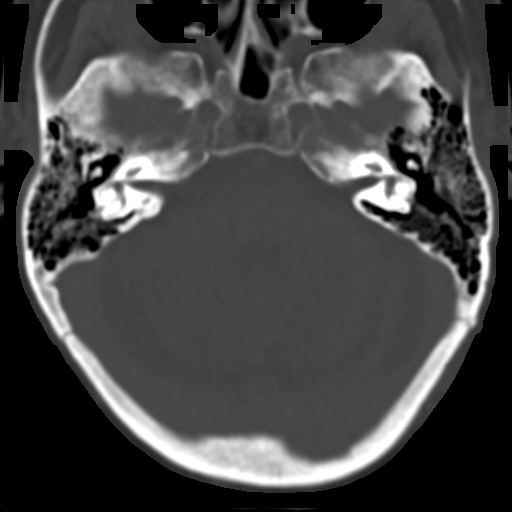

[Series 9: sag bone · sagittal · 0.31mm/px · 5 of 56 slices shown, 6 images]
[im 19/56  bone]
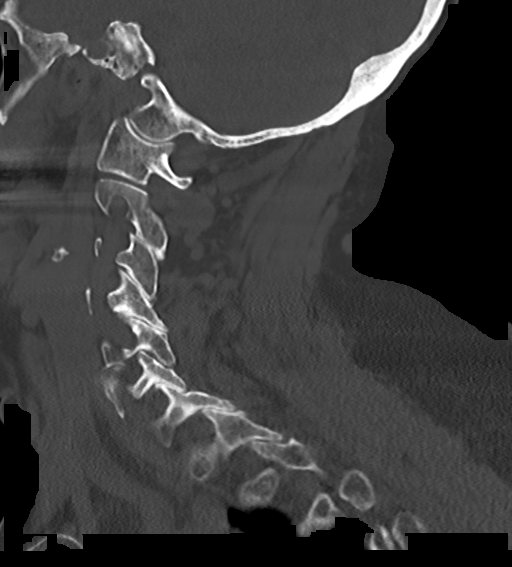
[im 23/56  bone]
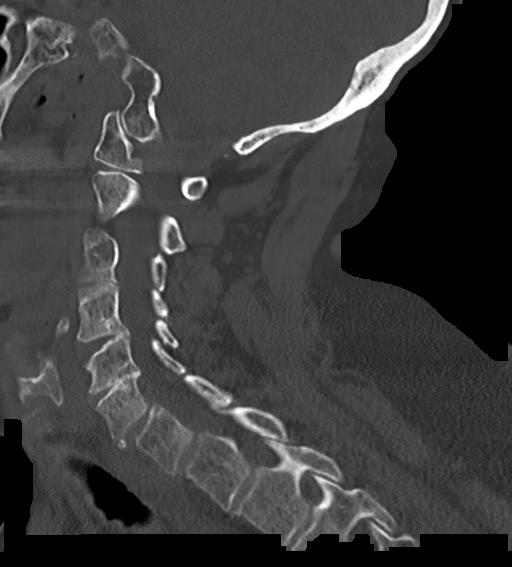
[im 28/56  soft-tissue]
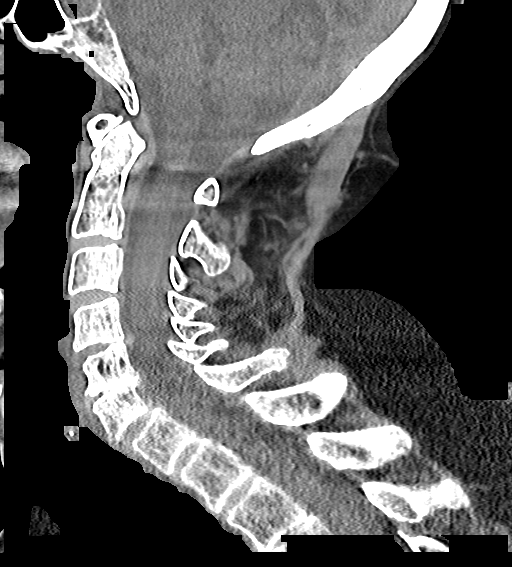
[im 28/56  bone]
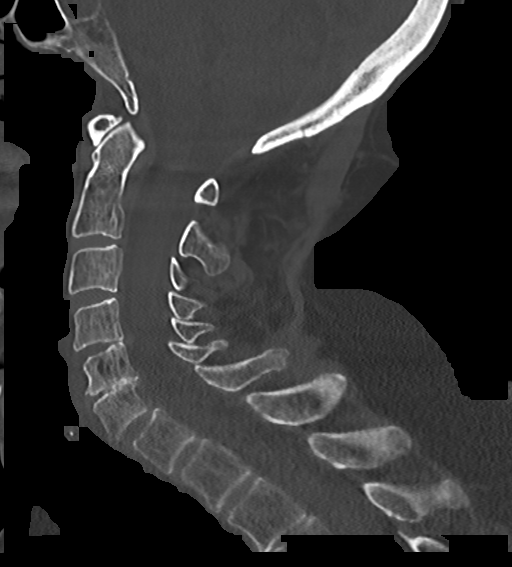
[im 33/56  bone]
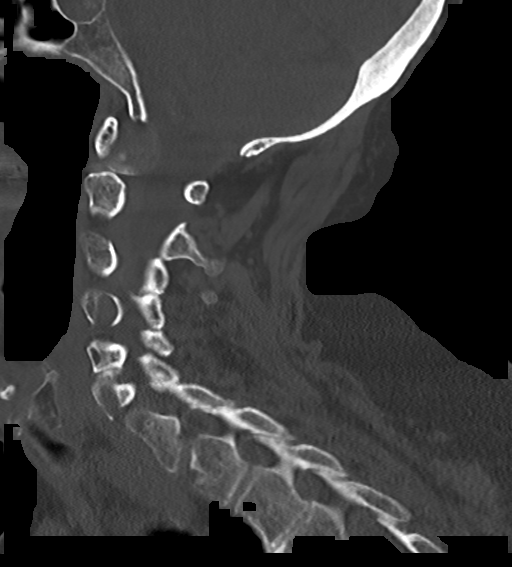
[im 37/56  bone]
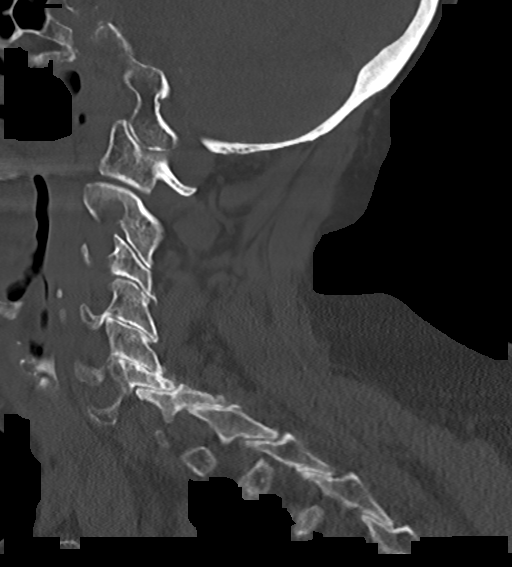

[Series 10: cor bone · coronal · 0.29mm/px · 3 of 71 slices shown]
[im 15/71  bone]
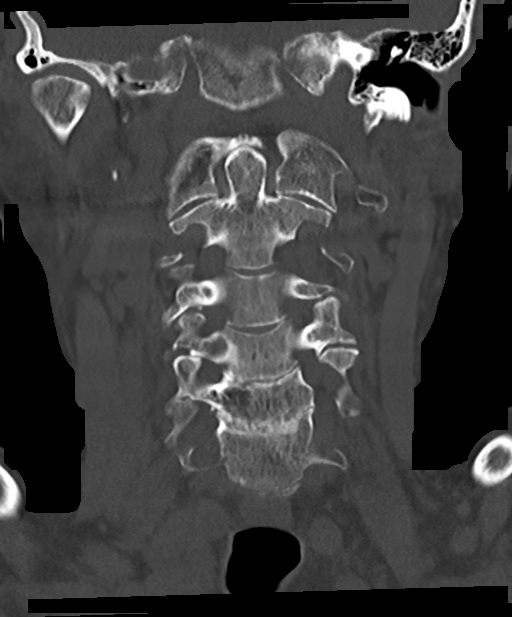
[im 29/71  bone]
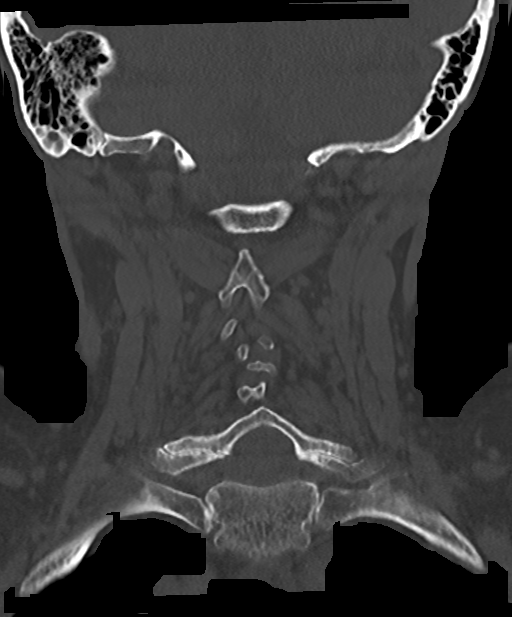
[im 43/71  bone]
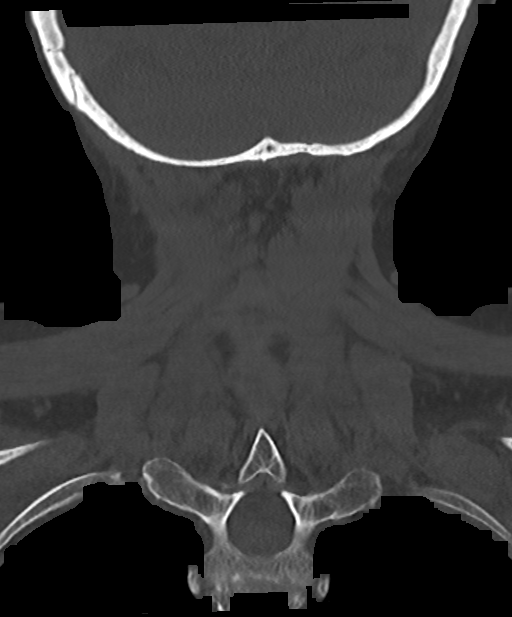

[14 of 33 positions shown; findings below may reference images not displayed]

FINDINGS: CT HEAD FINDINGS

Brain: Brain atrophy pattern and chronic white matter microvascular
ischemic changes. Benign punctate parenchymal calcification in the
left periventricular white matter, image 20 series 3. Cerebellar
atrophy as well. No acute intracranial hemorrhage, mass lesion,
definite new infarction, midline shift, herniation, hydrocephalus,
or extra-axial fluid collection. No focal mass effect or edema.
Cisterns are patent.

Vascular: Intracranial atherosclerosis.  No hyperdense vessel.

Skull: Normal. Negative for fracture or focal lesion.

Sinuses/Orbits: No acute finding.

Other: Left parieto-occipital scalp hematoma noted without
underlying skull fracture.

CT CERVICAL SPINE FINDINGS

Alignment: No malalignment, subluxation or dislocation. Facets
aligned.

Skull base and vertebrae: No acute fracture. No primary bone lesion
or focal pathologic process.

Soft tissues and spinal canal: No prevertebral fluid or soft tissue
swelling. No visible canal hyperdense hematoma. Carotid
atherosclerosis evident.

Disc levels: Advanced degenerative changes at C5-6 with marked disc
space narrowing, sclerosis and endplate osteophytes. Additional
multilevel mild degenerative disc disease and facet arthropathy.
Intact odontoid.

Upper chest: Negative.

Other: None.
IMPRESSION: Left posterior parietal scalp hematoma.

Brain atrophy and chronic white matter microvascular changes. No
acute intracranial abnormality by noncontrast CT.

Cervical spine degenerative changes without malalignment or acute
osseous finding. Negative for fracture.

Carotid atherosclerosis

## 2020-08-23 IMAGING — CT CT HEAD W/O CM
4 series · 16 of 47 positions shown, 18 images · non-contrast
Comparison: None.

CLINICAL DATA: Fall yesterday, head trauma, headache and neck pain

EXAM:
CT HEAD WITHOUT CONTRAST
CT CERVICAL SPINE WITHOUT CONTRAST
TECHNIQUE: Multidetector CT imaging of the head and cervical spine was
performed following the standard protocol without intravenous
contrast. Multiplanar CT image reconstructions of the cervical spine
were also generated.

[Series 3: head wo · axial · 0.43mm/px · z∈[+790,+910]mm · 7 of 34 slices shown, 9 images]
[im 5/34  brain]
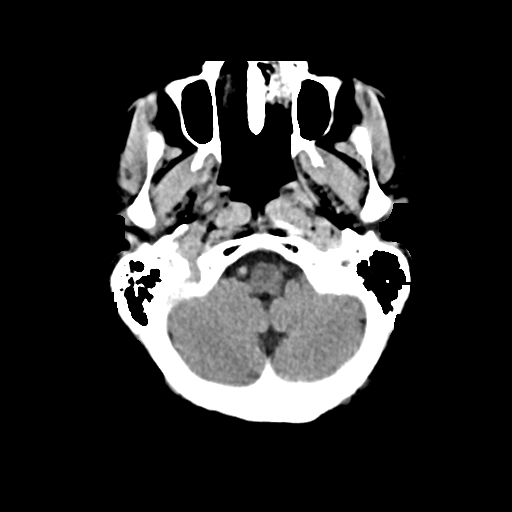
[im 5/34  bone]
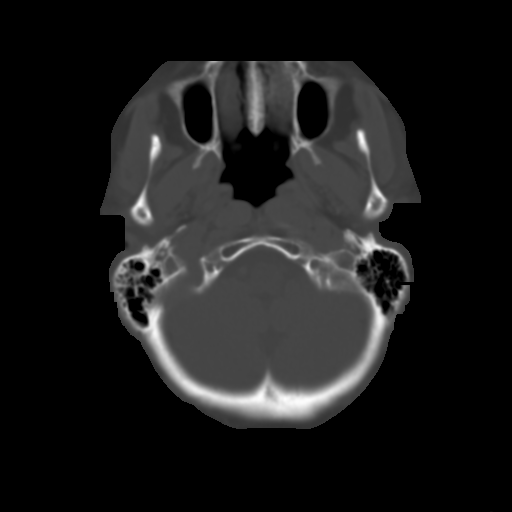
[im 9/34  brain]
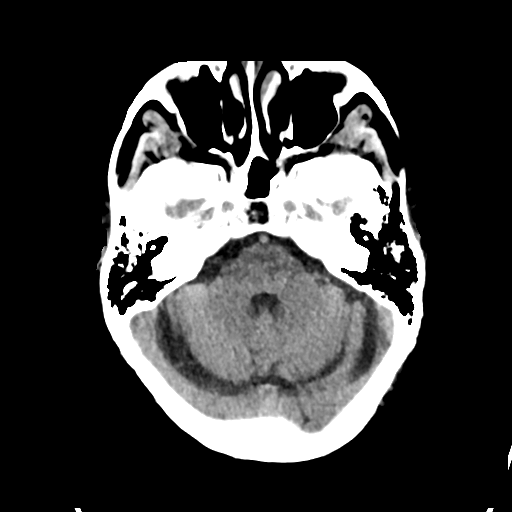
[im 13/34  brain]
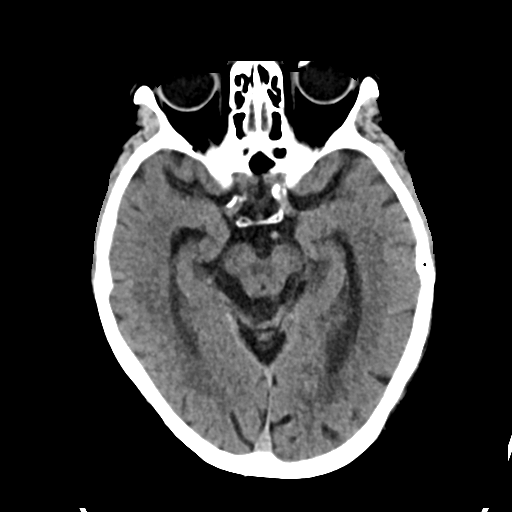
[im 17/34  brain]
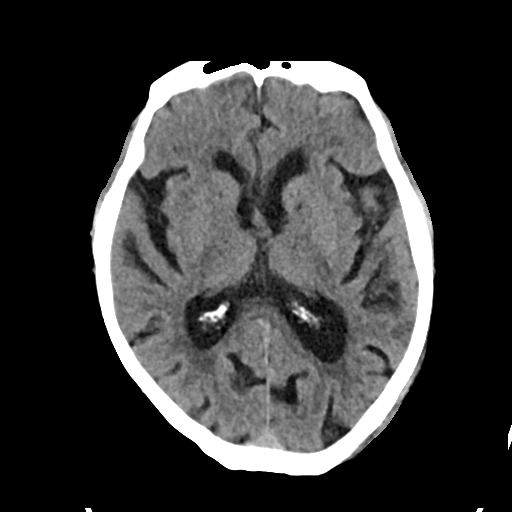
[im 21/34  brain]
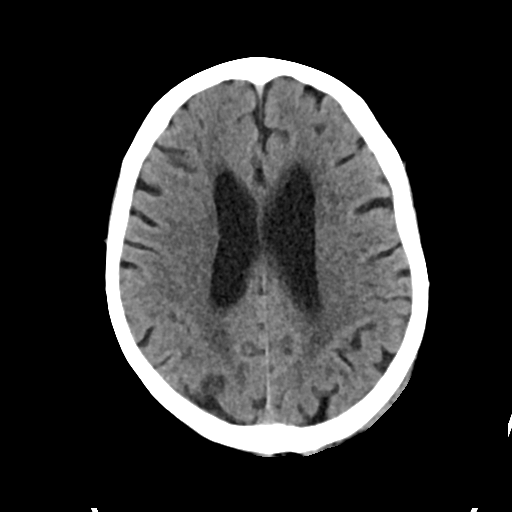
[im 21/34  bone]
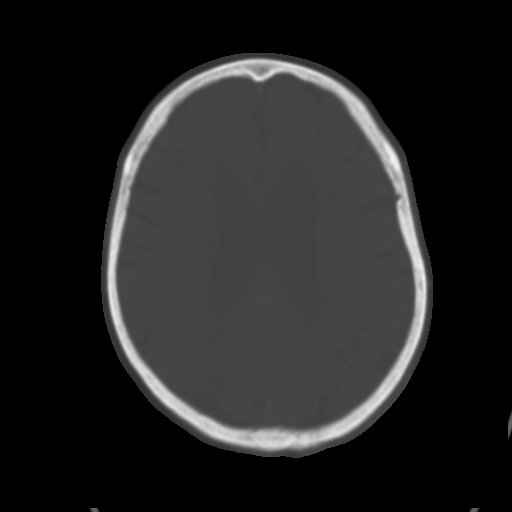
[im 25/34  brain]
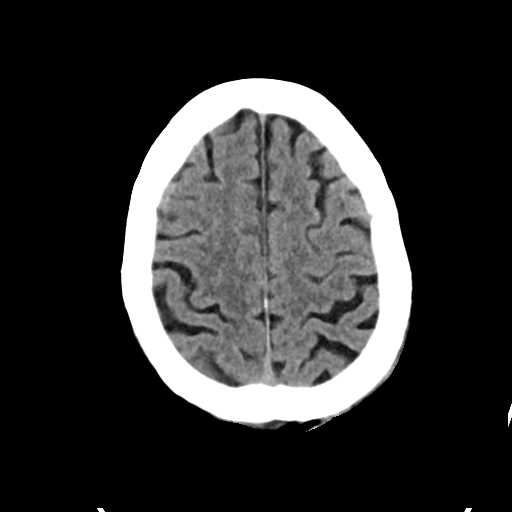
[im 29/34  brain]
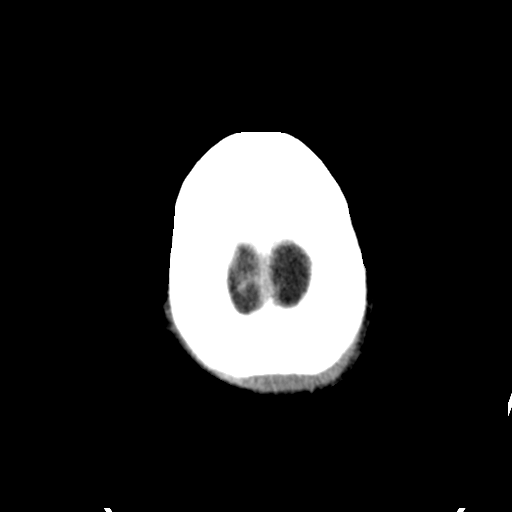

[Series 4: head bone · axial · 0.43mm/px · z∈[+786,+818]mm · 3 of 84 slices shown]
[im 9/84  bone]
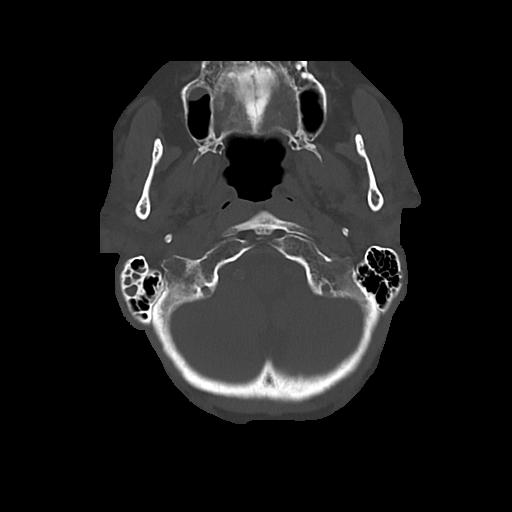
[im 17/84  bone]
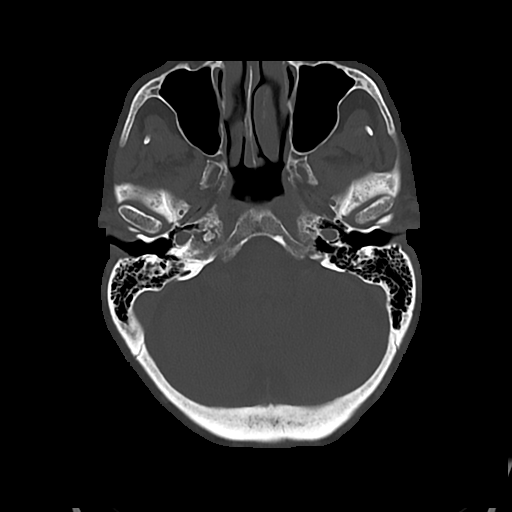
[im 25/84  bone]
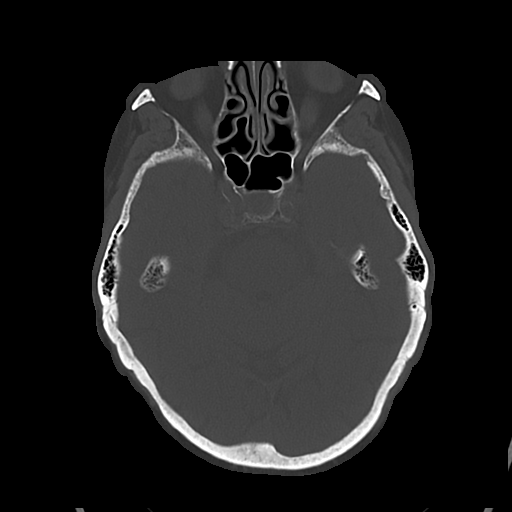

[Series 5: cor soft · coronal · 0.35mm/px · 3 of 67 slices shown]
[im 23/67  brain]
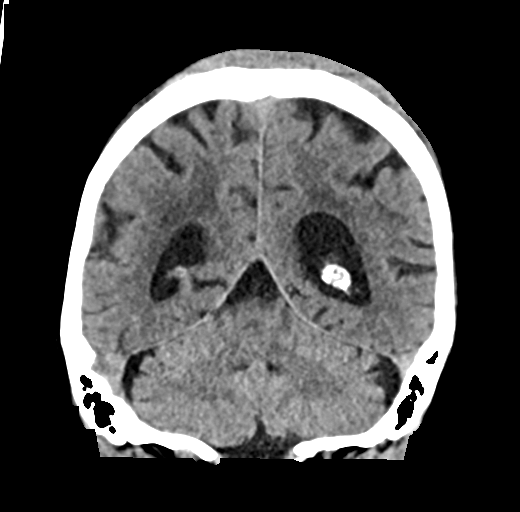
[im 30/67  brain]
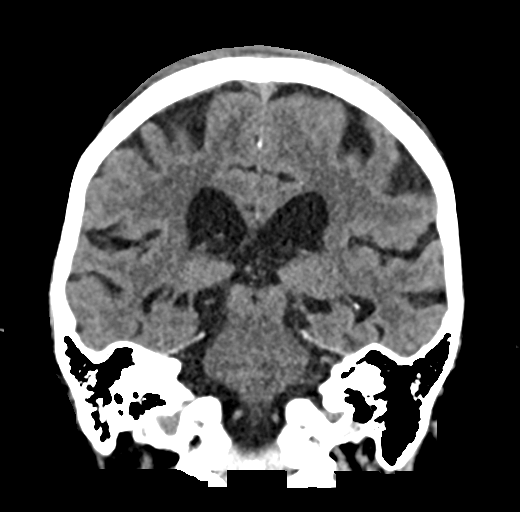
[im 37/67  brain]
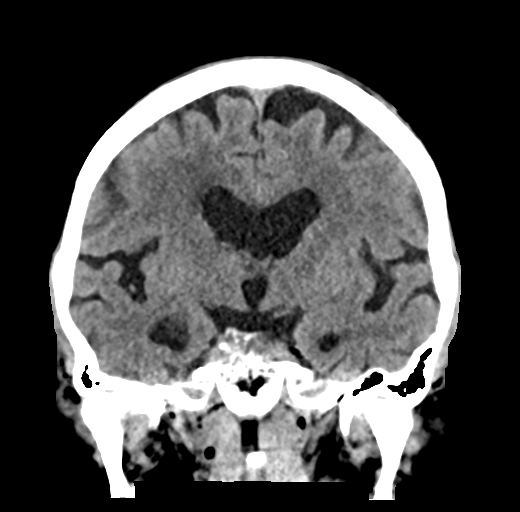

[Series 6: sag soft · sagittal · 0.33mm/px · 3 of 60 slices shown]
[im 20/60  brain]
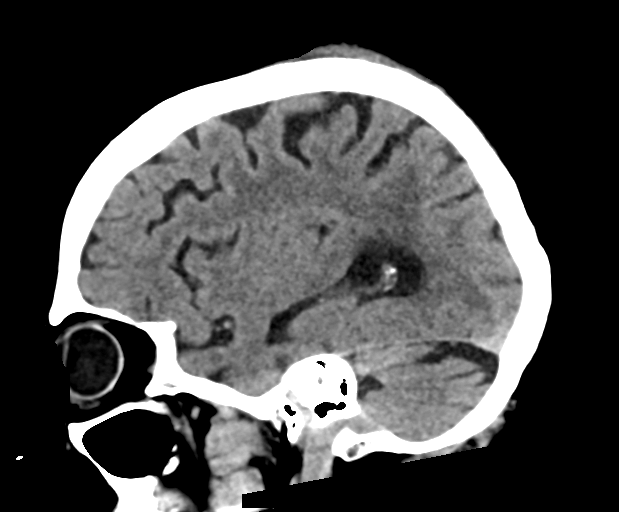
[im 30/60  brain]
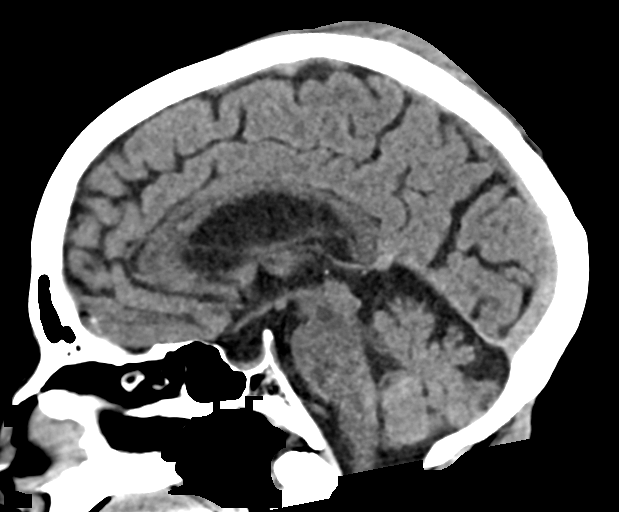
[im 40/60  brain]
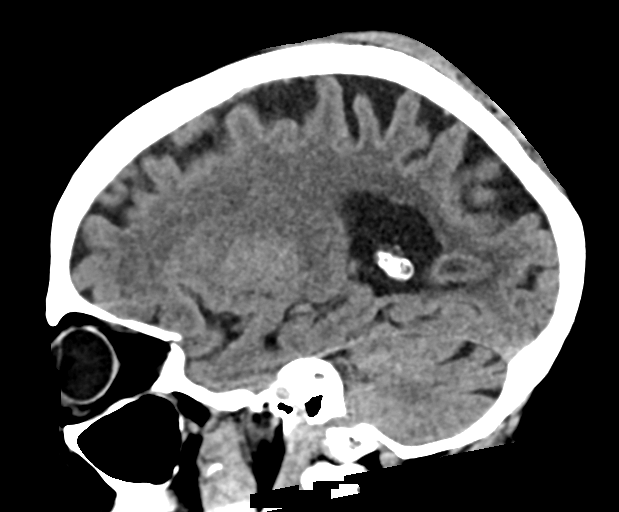

[16 of 47 positions shown; findings below may reference images not displayed]

FINDINGS: CT HEAD FINDINGS

Brain: Brain atrophy pattern and chronic white matter microvascular
ischemic changes. Benign punctate parenchymal calcification in the
left periventricular white matter, image 20 series 3. Cerebellar
atrophy as well. No acute intracranial hemorrhage, mass lesion,
definite new infarction, midline shift, herniation, hydrocephalus,
or extra-axial fluid collection. No focal mass effect or edema.
Cisterns are patent.

Vascular: Intracranial atherosclerosis.  No hyperdense vessel.

Skull: Normal. Negative for fracture or focal lesion.

Sinuses/Orbits: No acute finding.

Other: Left parieto-occipital scalp hematoma noted without
underlying skull fracture.

CT CERVICAL SPINE FINDINGS

Alignment: No malalignment, subluxation or dislocation. Facets
aligned.

Skull base and vertebrae: No acute fracture. No primary bone lesion
or focal pathologic process.

Soft tissues and spinal canal: No prevertebral fluid or soft tissue
swelling. No visible canal hyperdense hematoma. Carotid
atherosclerosis evident.

Disc levels: Advanced degenerative changes at C5-6 with marked disc
space narrowing, sclerosis and endplate osteophytes. Additional
multilevel mild degenerative disc disease and facet arthropathy.
Intact odontoid.

Upper chest: Negative.

Other: None.
IMPRESSION: Left posterior parietal scalp hematoma.

Brain atrophy and chronic white matter microvascular changes. No
acute intracranial abnormality by noncontrast CT.

Cervical spine degenerative changes without malalignment or acute
osseous finding. Negative for fracture.

Carotid atherosclerosis

## 2020-08-23 IMAGING — DX DG HIP (WITH OR WITHOUT PELVIS) 5+V BILAT
6 series · 6 of 6 positions shown · non-contrast
Comparison: None.

CLINICAL DATA: Fall, bilateral hip pain

EXAM:
DG HIP (WITH OR WITHOUT PELVIS) 5+V BILAT

[pelvis ap]
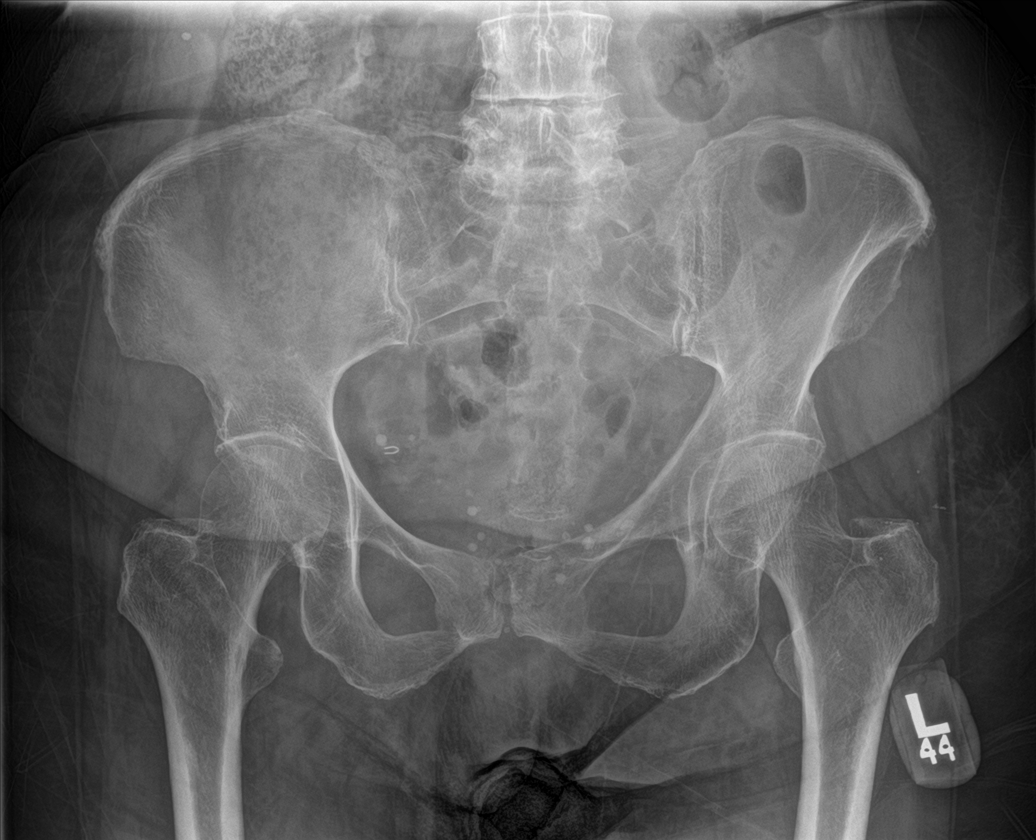

[hip ap (1 of 2)]
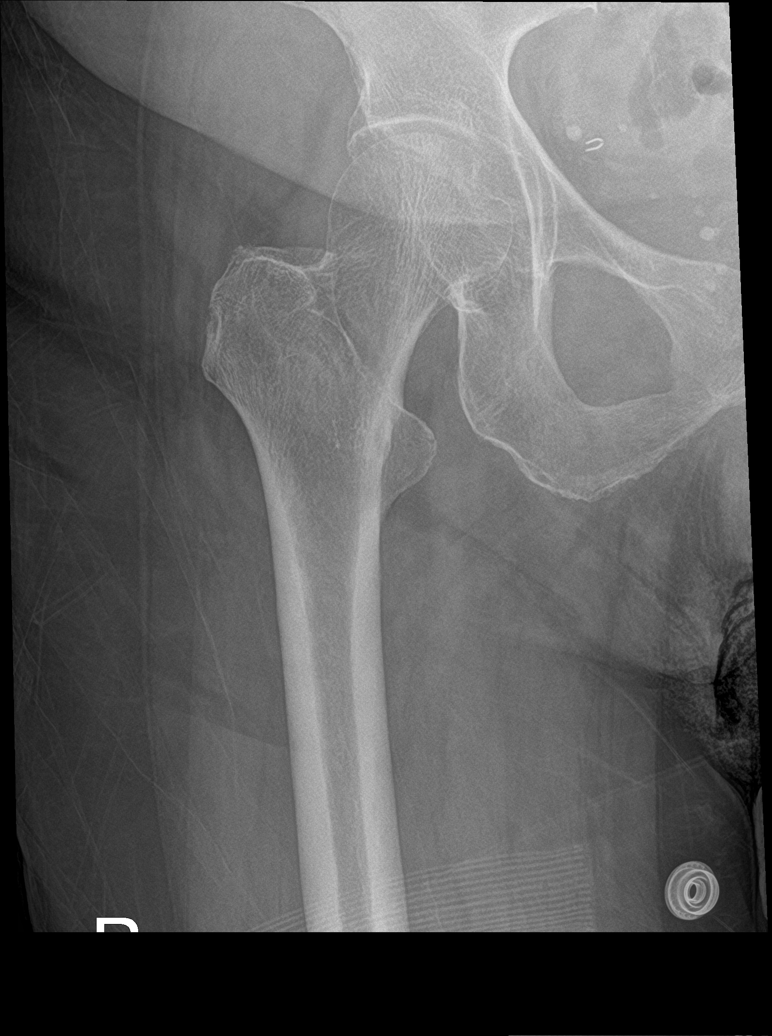

[hip lat (1 of 3)]
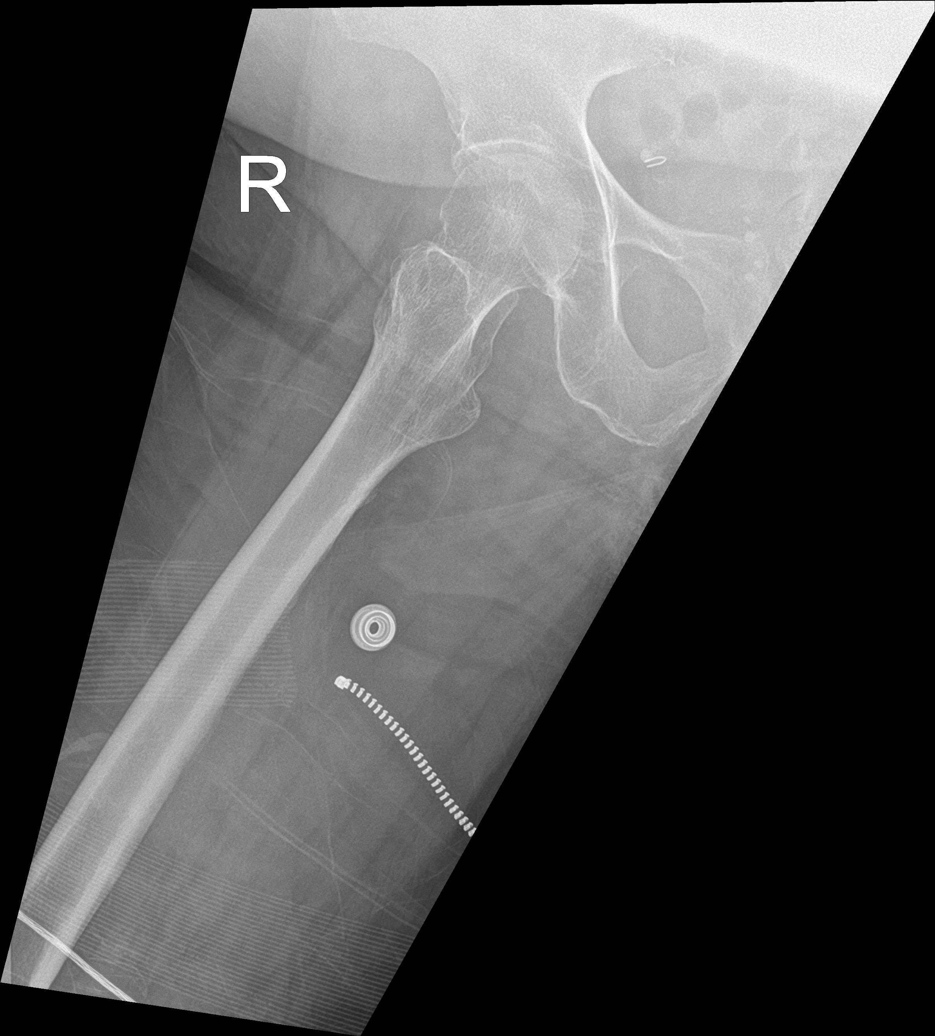

[hip ap (2 of 2)]
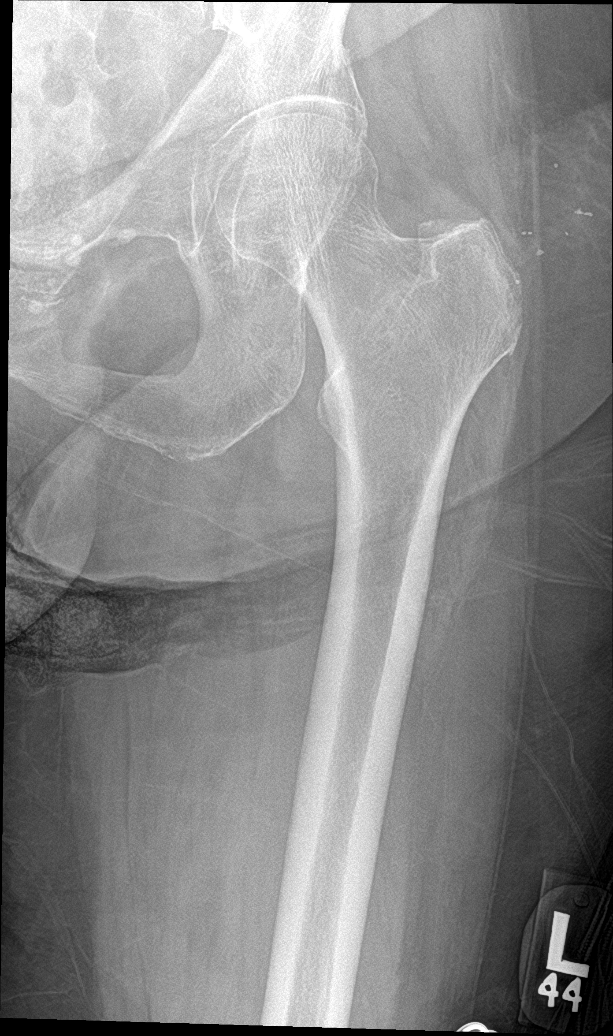

[hip lat (2 of 3)]
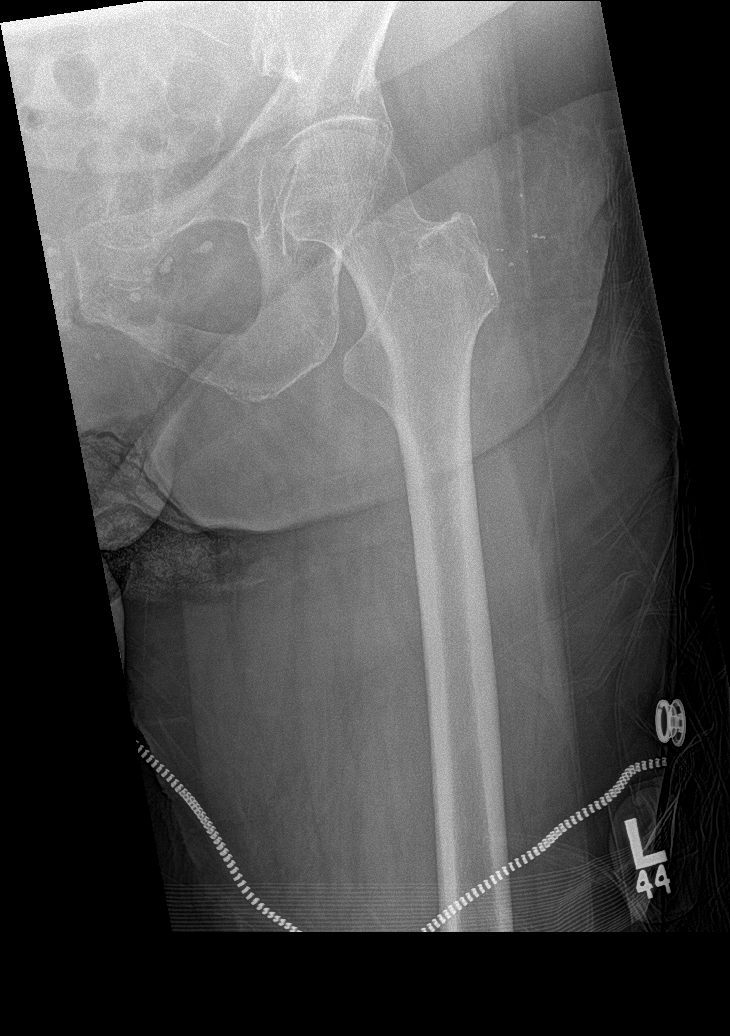

[hip lat (3 of 3)]
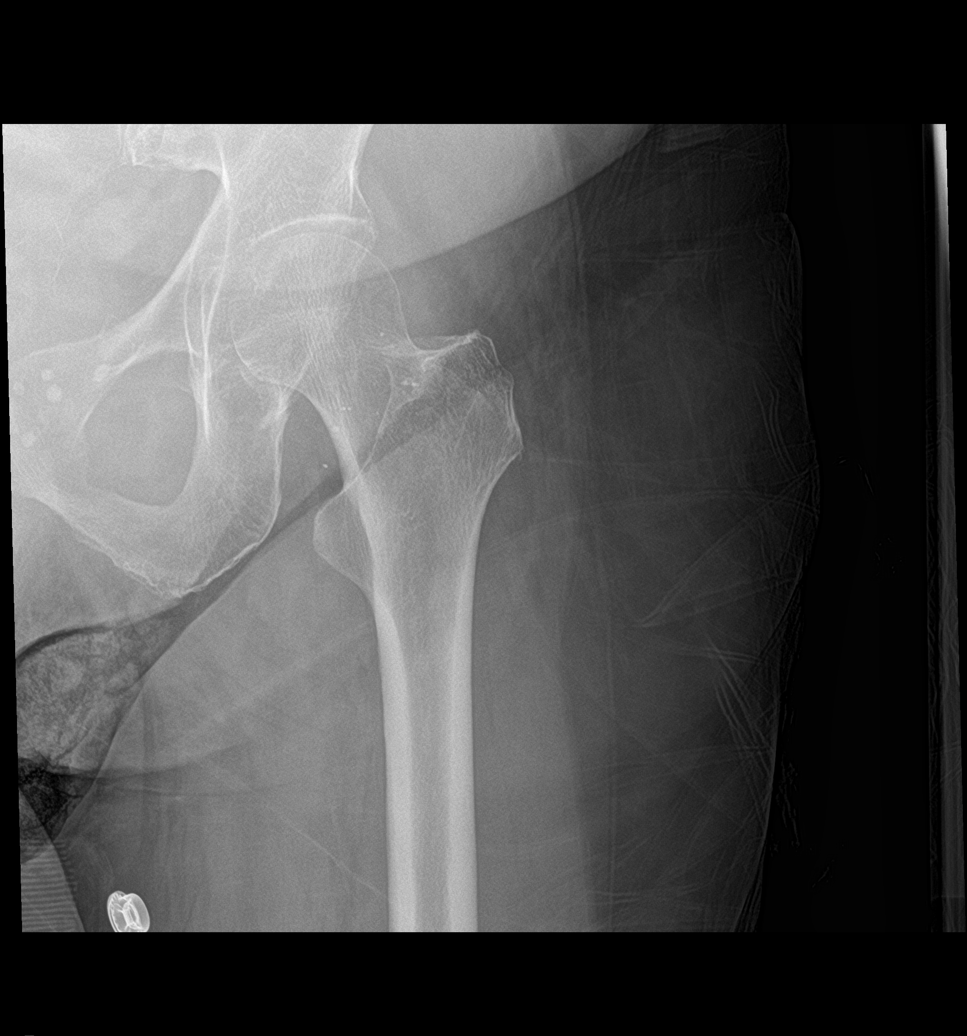

[6 of 6 positions shown; findings below may reference images not displayed]

FINDINGS: There is no evidence of hip fracture or dislocation. Mild
degenerative changes of the bilateral hips and SI joints. Lower
lumbar spondylosis. Bones are demineralized. There is no evidence of
focal bone abnormality.
IMPRESSION: No acute osseous abnormality of the bilateral hips.

## 2022-02-22 ENCOUNTER — Encounter (HOSPITAL_COMMUNITY): Payer: Self-pay

## 2022-02-22 ENCOUNTER — Emergency Department (EMERGENCY_DEPARTMENT_HOSPITAL): Payer: Medicare Other

## 2022-02-22 ENCOUNTER — Encounter (HOSPITAL_COMMUNITY)
Admission: EM | Disposition: A | Discharge status: Home / Self Care | Payer: Self-pay | Source: Home / Self Care | Attending: Emergency Medicine

## 2022-02-22 ENCOUNTER — Emergency Department (HOSPITAL_COMMUNITY): Payer: Medicare Other

## 2022-02-22 ENCOUNTER — Other Ambulatory Visit: Payer: Self-pay

## 2022-02-22 ENCOUNTER — Ambulatory Visit (HOSPITAL_COMMUNITY)
Admission: EM | Admit: 2022-02-22 | Discharge: 2022-02-23 | Disposition: A | Discharge status: Home / Self Care | Payer: Medicare Other | Attending: Cardiology | Admitting: Cardiology

## 2022-02-22 DIAGNOSIS — I351 Nonrheumatic aortic (valve) insufficiency: Secondary | ICD-10-CM | POA: Insufficient documentation

## 2022-02-22 DIAGNOSIS — F0280 Dementia in other diseases classified elsewhere without behavioral disturbance: Secondary | ICD-10-CM | POA: Insufficient documentation

## 2022-02-22 DIAGNOSIS — I509 Heart failure, unspecified: Secondary | ICD-10-CM | POA: Insufficient documentation

## 2022-02-22 DIAGNOSIS — Z66 Do not resuscitate: Secondary | ICD-10-CM | POA: Diagnosis not present

## 2022-02-22 DIAGNOSIS — I442 Atrioventricular block, complete: Secondary | ICD-10-CM

## 2022-02-22 DIAGNOSIS — R0789 Other chest pain: Secondary | ICD-10-CM | POA: Diagnosis not present

## 2022-02-22 DIAGNOSIS — I110 Hypertensive heart disease with heart failure: Secondary | ICD-10-CM | POA: Insufficient documentation

## 2022-02-22 DIAGNOSIS — R001 Bradycardia, unspecified: Secondary | ICD-10-CM | POA: Diagnosis not present

## 2022-02-22 DIAGNOSIS — E785 Hyperlipidemia, unspecified: Secondary | ICD-10-CM | POA: Diagnosis not present

## 2022-02-22 HISTORY — DX: Heart failure, unspecified: I50.9

## 2022-02-22 HISTORY — DX: Pleural effusion, not elsewhere classified: J90

## 2022-02-22 HISTORY — PX: PACEMAKER IMPLANT: EP1218

## 2022-02-22 HISTORY — DX: Dementia in other diseases classified elsewhere, unspecified severity, without behavioral disturbance, psychotic disturbance, mood disturbance, and anxiety: G30.9

## 2022-02-22 HISTORY — DX: Atrioventricular block, complete: I44.2

## 2022-02-22 HISTORY — DX: Gastro-esophageal reflux disease without esophagitis: K21.9

## 2022-02-22 HISTORY — DX: Hypokalemia: E87.6

## 2022-02-22 HISTORY — DX: Dementia in other diseases classified elsewhere, unspecified severity, without behavioral disturbance, psychotic disturbance, mood disturbance, and anxiety: F02.80

## 2022-02-22 HISTORY — DX: Atherosclerotic heart disease of native coronary artery without angina pectoris: I25.10

## 2022-02-22 LAB — CBC WITH DIFFERENTIAL/PLATELET
Abs Immature Granulocytes: 0.02 10*3/uL (ref 0.00–0.07)
Basophils Absolute: 0.1 10*3/uL (ref 0.0–0.1)
Basophils Relative: 1 %
Eosinophils Absolute: 0.4 10*3/uL (ref 0.0–0.5)
Eosinophils Relative: 5 %
HCT: 41.9 % (ref 36.0–46.0)
Hemoglobin: 13.7 g/dL (ref 12.0–15.0)
Immature Granulocytes: 0 %
Lymphocytes Relative: 28 %
Lymphs Abs: 2.3 10*3/uL (ref 0.7–4.0)
MCH: 30.1 pg (ref 26.0–34.0)
MCHC: 32.7 g/dL (ref 30.0–36.0)
MCV: 92.1 fL (ref 80.0–100.0)
Monocytes Absolute: 0.6 10*3/uL (ref 0.1–1.0)
Monocytes Relative: 7 %
Neutro Abs: 5 10*3/uL (ref 1.7–7.7)
Neutrophils Relative %: 59 %
Platelets: 179 10*3/uL (ref 150–400)
RBC: 4.55 MIL/uL (ref 3.87–5.11)
RDW: 15 % (ref 11.5–15.5)
WBC: 8.4 10*3/uL (ref 4.0–10.5)
nRBC: 0 % (ref 0.0–0.2)

## 2022-02-22 LAB — BASIC METABOLIC PANEL
Anion gap: 8 (ref 5–15)
BUN: 12 mg/dL (ref 8–23)
CO2: 23 mmol/L (ref 22–32)
Calcium: 8.9 mg/dL (ref 8.9–10.3)
Chloride: 111 mmol/L (ref 98–111)
Creatinine, Ser: 0.85 mg/dL (ref 0.44–1.00)
GFR, Estimated: >60 mL/min (ref >60)
Glucose, Bld: 93 mg/dL (ref 70–99)
Potassium: 4 mmol/L (ref 3.5–5.1)
Sodium: 142 mmol/L (ref 135–145)

## 2022-02-22 LAB — TROPONIN I (HIGH SENSITIVITY)
Troponin I (High Sensitivity): 11 ng/L (ref <18)
Troponin I (High Sensitivity): 9 ng/L (ref <18)

## 2022-02-22 LAB — SURGICAL PCR SCREEN
MRSA, PCR: NEGATIVE
Staphylococcus aureus: NEGATIVE

## 2022-02-22 LAB — ECHOCARDIOGRAM COMPLETE: S' Lateral: 3.3 cm

## 2022-02-22 LAB — BRAIN NATRIURETIC PEPTIDE: B Natriuretic Peptide: 372.1 pg/mL — ABNORMAL HIGH (ref 0.0–100.0)

## 2022-02-22 SURGERY — PACEMAKER IMPLANT

## 2022-02-22 MED ORDER — ACETAMINOPHEN 325 MG PO TABS: 325.0000 mg | ORAL_TABLET | ORAL | Status: DC | PRN

## 2022-02-22 MED ORDER — ROSUVASTATIN CALCIUM 5 MG PO TABS
10.0000 mg | ORAL_TABLET | Freq: Every day | ORAL | Status: DC
  Administered 2022-02-23: 10 mg via ORAL
  Filled 2022-02-22: qty 2

## 2022-02-22 MED ORDER — HEPARIN (PORCINE) IN NACL 1000-0.9 UT/500ML-% IV SOLN
INTRAVENOUS | Status: DC | PRN
  Administered 2022-02-22: 500 mL

## 2022-02-22 MED ORDER — PROSOURCE PLUS PO LIQD
30.0000 mL | Freq: Two times a day (BID) | ORAL | Status: DC
  Administered 2022-02-23 (×2): 30 mL via ORAL
  Filled 2022-02-22 (×2): qty 30

## 2022-02-22 MED ORDER — SODIUM CHLORIDE 0.9 % IV SOLN
INTRAVENOUS | Status: AC
  Filled 2022-02-22: qty 2

## 2022-02-22 MED ORDER — CEFAZOLIN SODIUM-DEXTROSE 2-4 GM/100ML-% IV SOLN
INTRAVENOUS | Status: AC
  Filled 2022-02-22: qty 100

## 2022-02-22 MED ORDER — LEVOTHYROXINE SODIUM 75 MCG PO TABS
75.0000 ug | ORAL_TABLET | Freq: Every day | ORAL | Status: DC
  Administered 2022-02-23: 75 ug via ORAL
  Filled 2022-02-22: qty 1

## 2022-02-22 MED ORDER — ENSURE ENLIVE PO LIQD
237.0000 mL | Freq: Three times a day (TID) | ORAL | Status: DC
  Administered 2022-02-23: 237 mL via ORAL

## 2022-02-22 MED ORDER — DIVALPROEX SODIUM 125 MG PO DR TAB: 125.0000 mg | DELAYED_RELEASE_TABLET | Freq: Every day | ORAL | Status: DC

## 2022-02-22 MED ORDER — LIDOCAINE HCL (PF) 1 % IJ SOLN
INTRAMUSCULAR | Status: DC | PRN
  Administered 2022-02-22: 60 mL

## 2022-02-22 MED ORDER — QUETIAPINE FUMARATE 50 MG PO TABS: 50.0000 mg | ORAL_TABLET | Freq: Every day | ORAL | Status: DC

## 2022-02-22 MED ORDER — CEFAZOLIN SODIUM-DEXTROSE 2-4 GM/100ML-% IV SOLN
2.0000 g | INTRAVENOUS | Status: AC
  Administered 2022-02-22: 2 g via INTRAVENOUS

## 2022-02-22 MED ORDER — SODIUM CHLORIDE 0.9 % IV SOLN: INTRAVENOUS | Status: DC

## 2022-02-22 MED ORDER — SENNOSIDES-DOCUSATE SODIUM 8.6-50 MG PO TABS
2.0000 | ORAL_TABLET | Freq: Two times a day (BID) | ORAL | Status: DC
  Administered 2022-02-23: 2 via ORAL
  Filled 2022-02-22: qty 2

## 2022-02-22 MED ORDER — HEPARIN (PORCINE) IN NACL 1000-0.9 UT/500ML-% IV SOLN
INTRAVENOUS | Status: AC
  Filled 2022-02-22: qty 500

## 2022-02-22 MED ORDER — PANTOPRAZOLE SODIUM 40 MG PO TBEC
40.0000 mg | DELAYED_RELEASE_TABLET | Freq: Two times a day (BID) | ORAL | Status: DC
  Administered 2022-02-23: 40 mg via ORAL
  Filled 2022-02-22: qty 1

## 2022-02-22 MED ORDER — CHLORHEXIDINE GLUCONATE 4 % EX LIQD: 60.0000 mL | Freq: Once | CUTANEOUS | Status: DC

## 2022-02-22 MED ORDER — LIDOCAINE HCL (PF) 1 % IJ SOLN
INTRAMUSCULAR | Status: AC
  Filled 2022-02-22: qty 60

## 2022-02-22 MED ORDER — NITROGLYCERIN 0.4 MG SL SUBL: 0.4000 mg | SUBLINGUAL_TABLET | SUBLINGUAL | Status: DC | PRN

## 2022-02-22 MED ORDER — ASPIRIN 81 MG PO TBEC
81.0000 mg | DELAYED_RELEASE_TABLET | Freq: Every day | ORAL | Status: DC
  Administered 2022-02-23: 81 mg via ORAL
  Filled 2022-02-22: qty 1

## 2022-02-22 MED ORDER — SODIUM CHLORIDE 0.9 % IV SOLN
80.0000 mg | INTRAVENOUS | Status: AC
  Administered 2022-02-22: 80 mg

## 2022-02-22 MED ORDER — OXYCODONE HCL 5 MG PO TABS: 2.5000 mg | ORAL_TABLET | Freq: Four times a day (QID) | ORAL | Status: DC | PRN

## 2022-02-22 MED ORDER — CEFAZOLIN SODIUM-DEXTROSE 1-4 GM/50ML-% IV SOLN
1.0000 g | Freq: Four times a day (QID) | INTRAVENOUS | Status: AC
  Administered 2022-02-23 (×3): 1 g via INTRAVENOUS
  Filled 2022-02-22 (×3): qty 50

## 2022-02-22 SURGICAL SUPPLY — 8 items
CABLE SURGICAL S-101-97-12 (CABLE) ×2 IMPLANT
LEAD ULTIPACE 52 LPA1231/52 (Lead) IMPLANT
LEAD ULTIPACE 58 LPA1231/58 (Lead) IMPLANT
MAT PREVALON FULL STRYKER (MISCELLANEOUS) IMPLANT
PACEMAKER ASSURITY DR-RF (Pacemaker) IMPLANT
PAD DEFIB RADIO PHYSIO CONN (PAD) ×2 IMPLANT
SHEATH 7FR PRELUDE SNAP 13 (SHEATH) IMPLANT
TRAY PACEMAKER INSERTION (PACKS) ×2 IMPLANT

## 2022-02-22 NOTE — Discharge Instructions (Signed)
After Your Pacemaker    ACTIVITY Do not lift your arm above shoulder height for 1 week after your procedure. After 7 days, you may progress as below.  You should remove your sling 24 hours after your procedure, unless otherwise instructed by your provider.     Friday March 01, 2022  Saturday March 02, 2022 Sunday March 03, 2022 Monday March 04, 2022   Do not lift, push, pull, or carry anything over 10 pounds with the affected arm until 6 weeks (Friday April 05, 2022 ) after your procedure.   You may drive AFTER your wound check, unless you have been told otherwise by your provider.   Ask your healthcare provider when you can go back to work   INCISION/Dressing If you are on a blood thinner such as Coumadin, Xarelto, Eliquis, Plavix, or Pradaxa please confirm with your provider when this should be resumed.   If large square, outer bandage is left in place, this can be removed after 24 hours from your procedure. Do not remove steri-strips or glue as below.   Monitor your Pacemaker site for redness, swelling, and drainage. Call the device clinic at [phone] if you experience these symptoms or fever/chills.  If your incision is sealed with Steri-strips or staples, you may shower 7 days after your procedure or when told by your provider. Do not remove the steri-strips or let the shower hit directly on your site. You may wash around your site with soap and water.    If you were discharged in a sling, please do not wear this during the day more than 48 hours after your surgery unless otherwise instructed. This may increase the risk of stiffness and soreness in your shoulder.   Avoid lotions, ointments, or perfumes over your incision until it is well-healed.  You may use a hot tub or a pool AFTER your wound check appointment if the incision is completely closed.  Pacemaker Alerts:  Some alerts are vibratory and others beep. These are NOT emergencies. Please call our office to  let us know. If this occurs at night or on weekends, it can wait until the next business day. Send a remote transmission.  If your device is capable of reading fluid status (for heart failure), you will be offered monthly monitoring to review this with you.   DEVICE MANAGEMENT Remote monitoring is used to monitor your pacemaker from home. This monitoring is scheduled every 91 days by our office. It allows [place] to keep an eye on the functioning of your device to ensure it is working properly. You will routinely see your Electrophysiologist annually (more often if necessary).   You should receive your ID card for your new device in 4-8 weeks. Keep this card with you at all times once received. Consider wearing a medical alert bracelet or necklace.  Your Pacemaker may be MRI compatible. This will be discussed at your next office visit/wound check.  You should avoid contact with strong electric or magnetic fields.   Do not use amateur (ham) radio equipment or electric (arc) welding torches. MP3 player headphones with magnets should not be used. Some devices are safe to use if held at least 12 inches (30 cm) from your Pacemaker. These include power tools, lawn mowers, and speakers. If you are unsure if something is safe to use, ask your health care provider.  When using your cell phone, hold it to the ear that is on the opposite side from the Pacemaker. Do not leave your  cell phone in a pocket over the Pacemaker.  You may safely use electric blankets, heating pads, computers, and microwave ovens.  Call the office right away if: You have chest pain. You feel more short of breath than you have felt before. You feel more light-headed than you have felt before. Your incision starts to open up.  This information is not intended to replace advice given to you by your health care provider. Make sure you discuss any questions you have with your health care provider.

## 2022-02-22 NOTE — Social Work (Signed)
CSW spoke to [employer] she is able to return back to facility when DC.

## 2022-02-22 NOTE — ED Triage Notes (Signed)
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

## 2022-02-22 NOTE — ED Notes (Addendum)
Pt's husband Baillie Mohammad) gives verbal consent and verbalized understanding of permanent pacemaker implantation procedure; witnessed by Joan Mayans, RN

## 2022-02-22 NOTE — ED Notes (Signed)
Echo at bedside

## 2022-02-22 NOTE — Progress Notes (Signed)
Echocardiogram 2D Echocardiogram has been performed.  Warren Lacy Ma Munoz RDCS 02/22/2022, 1:56 PM

## 2022-02-22 NOTE — H&P (Addendum)
ELECTROPHYSIOLOGY CONSULT NOTE    Patient ID: Jordan Horne MRN: [id number], DOB/AGE: [birth year] 86 y.o.  Admit date: 02/22/2022 Date of Consult: 02/22/2022  Primary Physician: Erskine Emery, NP Primary Cardiologist: Dr. Tomie China Electrophysiologist: New  Referring Provider: Dr. Suezanne Jacquet  Patient Profile: Jordan Horne is a 88 y.o. female with a history of HLD, HTN, and intermittent agina who is being seen today for the evaluation of CHB at the request of Dr. Suezanne Jacquet.  HPI:  Jordan Horne is a 88 y.o. female with medical history as above.    Followed distantly by Dr. Tomie China, last seen in 2021.  Myoview 06/2019 showed:  The left ventricular ejection fraction is normal (55-65%). Nuclear stress EF: 62%. There was no ST segment deviation noted during stress. No T wave inversion was noted during stress. Defect 1: There is a medium defect of mild severity present in the basal inferoseptal and mid inferoseptal location. There is mild hypokinesis of the basal to mid inferolateral wall. Findings consistent with prior myocardial infarction with a small area of peri-infarct ischemia. This is an intermediate risk study.  Potassium4.0 (12/01 0945) Creatinine, ser  0.85 (12/01 0945) PLT  179 (12/01 0945) HGB  13.7 (12/01 0945) WBC 8.4 (12/01 0945) Troponin I (High Sensitivity)11 (12/01 0945).    Difficulty history as pt has dementia, and her husband is not completely aware of all her care.   Pt is reportedly staying at [facility] after a "COVID admission" within the past several months per her husband.  Per the notes, bradycardia was noted early November, and she was "referred to cardiology with a plan for pacemaker in January.".  The husband states he has no knowledge of a clear plan for pacemaker, and nothing is available in the chart.   She was sent today to [facility] over concern over her persistently low HR. It is not clear what exactly prompted the concern.  Pt is aware.  Alert to person. She responds to questions, but does have confusion at baseline. She initially says she is in a convenience store, but when told she is in the hospital she says "Oh right. Because my heart is slow."   She reports fatigue but is unable to clarify when/if this has worsened recently. She denies syncope, and husband is not aware of syncope.  She has occasional atypical chest pain which is chronic per the chart.    Past Medical History:  Diagnosis Date   Abnormal electrocardiogram (ECG) (EKG) 05/07/2018   Alzheimer disease (HCC)    CAP (community acquired pneumonia) 03/24/2018   Chest pain of uncertain etiology 09/29/2013   CHF (congestive heart failure) (HCC)    Coronary artery disease    Dementia (HCC)    GERD (gastroesophageal reflux disease)    Glaucoma    Hypercholesterolemia 09/29/2013   Hypertension    Hypokalemia    Hypothyroidism 09/29/2013   Pleural effusion    SBO (small bowel obstruction) (HCC) 03/24/2018   Thyroid disease      Surgical History:  Past Surgical History:  Procedure Laterality Date   ABDOMINAL HYSTERECTOMY     CHEST TUBE INSERTION Right 03/23/2020   Procedure: CHEST TUBE INSERTION;  Surgeon: Purcell Nails, MD;  Location: [facility] OR;  Service: Thoracic;  Laterality: Right;   IR THORACENTESIS ASP PLEURAL SPACE W/IMG GUIDE  03/22/2020   THYROID CYST EXCISION       (Not in a hospital admission)   Inpatient Medications:   Allergies:  Allergies  Allergen Reactions  Shellfish Allergy    Betadine [Povidone Iodine] Itching    Social History   Socioeconomic History   Marital status: Married    Spouse name: Not on file   Number of children: Not on file   Years of education: Not on file   Highest education level: Not on file  Occupational History   Not on file  Tobacco Use   Smoking status: Never   Smokeless tobacco: Never  Substance and Sexual Activity   Alcohol use: Yes    Alcohol/week: 1.0 standard drink of alcohol    Types: 1  Glasses of wine per week    Comment: just occasional   Drug use: No   Sexual activity: Not on file  Other Topics Concern   Not on file  Social History Narrative   Not on file   Social Determinants of Health   Financial Resource Strain: Not on file  Food Insecurity: Not on file  Transportation Needs: Not on file  Physical Activity: Not on file  Stress: Not on file  Social Connections: Not on file  Intimate Partner Violence: Not on file     Family History  Problem Relation Age of Onset   Arthritis Mother    Heart failure Mother    Heart attack Father    Cancer Brother      Review of Systems: All other systems reviewed and are otherwise negative except as noted above.  Physical Exam: Vitals:   02/22/22 1045 02/22/22 1145 02/22/22 1200 02/22/22 1215  BP: (!) 176/57 (!) 179/52 (!) 190/61 (!) 189/56  Pulse: (!) 33 67 65 (!) 37  Resp: 18 14  14   Temp:      TempSrc:      SpO2: 95% 98% 95% 91%    GEN- The patient is well appearing, alert and oriented x 3 today.   HEENT: normocephalic, atraumatic; sclera clear, conjunctiva pink; hearing intact; oropharynx clear; neck supple Lungs- Clear to ausculation bilaterally, normal work of breathing.  No wheezes, rales, rhonchi Heart- Regular rate and rhythm, no murmurs, rubs or gallops GI- soft, non-tender, non-distended, bowel sounds present Extremities- no clubbing, cyanosis, or edema; DP/PT/radial pulses 2+ bilaterally MS- no significant deformity or atrophy Skin- warm and dry, no rash or lesion Psych- euthymic mood, full affect Neuro- strength and sensation are intact  Labs:   Lab Results  Component Value Date   WBC 8.4 02/22/2022   HGB 13.7 02/22/2022   HCT 41.9 02/22/2022   MCV 92.1 02/22/2022   PLT 179 02/22/2022    Recent Labs  Lab 02/22/22 0945  NA 142  K 4.0  CL 111  CO2 23  BUN 12  CREATININE 0.85  CALCIUM 8.9  GLUCOSE 93      Radiology/Studies: DG Chest 1 View  Result Date: 02/22/2022 CLINICAL  DATA:  Bradycardia EXAM: CHEST  1 VIEW COMPARISON:  Chest radiograph dated 03/29/2020 FINDINGS: Normal lung volumes. Retrocardiac patchy opacities. No pleural effusion or pneumothorax. Enlarged cardiomediastinal silhouette. The visualized skeletal structures are unremarkable. Right upper quadrant surgical clips. IMPRESSION: 1. Retrocardiac patchy opacities, which may represent atelectasis. Aspiration or pneumonia can be considered in the appropriate clinical setting. 2. Cardiomegaly. Electronically Signed   By: 05/27/2020 M.D.   On: 02/22/2022 10:01    EKG: on arrival shows CHB in the 30s with a right bundle/IVCD escape, similar in morphology to her baseline EKGs, though wider (personally reviewed)  TELEMETRY: CHB 30-40s (personally reviewed)  Assessment/Plan: 1.  CHB No clear reversible cause Echo  pending. Myoview in the past with some scar but normal EF.  Explained risks, benefits, and alternatives to PPM implantation with patients husband. including but not limited to bleeding, infection, pneumothorax, pericardial effusion, lead dislodgement, heart attack, stroke, or death.  He verbalized understanding and agrees to proceed. Pt is aware enough to state that if her husband is OK with it, so is she.    For questions or updates, please contact [facility] Please consult [email or web address] for contact info under Cardiology/STEMI.  Signed, Graciella Freer, PA-C  02/22/2022 12:25 PM   I have seen and examined this patient with Otilio Saber.  Agree with above, note added to reflect my findings.  Patient presented to the hospital after being found to be bradycardic at skilled nursing.  She had been bradycardic for the last few months and had a appointment scheduled with cardiology.  It is unclear as to if there was any inciting factor that may then refer her to the emergency room.  She states that she feels well.  In discussion with her husband, she has been more tired and fatigued over the last  few months.  GEN: Well nourished, well developed, in no acute distress  HEENT: normal  Neck: no JVD, carotid bruits, or masses Cardiac: Bradycardic, regular ; no murmurs, rubs, or gallops,no edema  Respiratory:  clear to auscultation bilaterally, normal work of breathing GI: soft, nontender, nondistended, + BS MS: no deformity or atrophy  Skin: warm and dry Neuro:  Strength and sensation are intact Psych: euthymic mood, full affect   Complete heart block: No reversible cause.  I have discussed with her husband who feels that pacemaker implant would be indicated.  He feels that the patient would be amenable to this procedure for make her feel better.  Risk and benefits have been discussed.  Risk include bleeding, tamponade, infection, pneumothorax, lead dislodgment.  The patient's husband understands these risks and is agreed to the procedure.  Wyeth Hoffer M. Ewald Beg MD 02/22/2022 1:17 PM

## 2022-02-22 NOTE — ED Provider Notes (Addendum)
[facility] Provider Note  CSN: [id number] Arrival date & time: 02/22/22 [id number]  Chief Complaint(s) Bradycardia  HPI Jordan Horne is a 88 y.o. female with history of Alzheimer's disease, CHF, dementia, hypertension, hyperlipidemia presenting to the emergency department for low heart rate.  Patient was apparently referred to cardiology for low heart rate at the beginning of this month.  She was scheduled to have pacemaker placement in January.  Apparently medical staff at nursing home felt patient should come to the emergency department for more expedited evaluation.  Patient denies any complaints such as nausea, vomiting, chest pain, difficulty breathing, syncope, lightheadedness back pain, or any other symptoms.   Past Medical History Past Medical History:  Diagnosis Date   Abnormal electrocardiogram (ECG) (EKG) 05/07/2018   Abnormal nuclear stress test 07/27/2019   Alzheimer disease ([place])    CAP (community acquired pneumonia) 03/24/2018   Chest pain of uncertain etiology [id number]   CHF (congestive heart failure) ([place])    Complete AV block ([place]) 02/22/2022   Coronary artery disease    Dementia (HCC)    Elevated blood pressure reading 03/07/2020   Fall at home, initial encounter 03/07/2020   GERD (gastroesophageal reflux disease)    Glaucoma    Hypercholesterolemia 09/29/2013   Hypertension    Hypokalemia    Hypothyroidism 09/29/2013   Intractable pain 03/07/2020   Multiple fractures of ribs, right side, initial encounter for closed fracture 03/07/2020   Pleural effusion    Prolonged QT interval 03/07/2020   Right rib fracture 03/07/2020   SBO (small bowel obstruction) ([place]) 03/24/2018   Patient Active Problem List   Diagnosis Date Noted   Presence of permanent cardiac pacemaker 03/08/2022   Mixed dyslipidemia 03/08/2022   Mild aortic regurgitation 03/08/2022   Alzheimer disease ([place]) 03/01/2022   CHF (congestive heart failure) ([place]) 03/01/2022    Coronary artery disease 03/01/2022   GERD (gastroesophageal reflux disease) 03/01/2022   Glaucoma 03/01/2022   Hypertension 03/01/2022   Hypokalemia 03/01/2022   Pleural effusion 03/01/2022   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/22/2022   Multiple fractures of ribs, right side, initial encounter for closed fracture 03/07/2020   Intractable pain 03/07/2020   Dementia ([place]) 03/07/2020   Fall at home, initial encounter 03/07/2020   Elevated blood pressure reading 03/07/2020   Prolonged QT interval 03/07/2020   Right rib fracture 03/07/2020   Abnormal nuclear stress test 07/27/2019   Abnormal electrocardiogram (ECG) (EKG) 05/07/2018   SBO (small bowel obstruction) ([place]) 03/24/2018   CAP (community acquired pneumonia) 03/24/2018   Chest pain of uncertain etiology Q000111Q   Hypothyroidism 09/29/2013   Hypercholesterolemia 09/29/2013   Home Medication(s) Prior to Admission medications   Medication Sig Start Date End Date Taking? Authorizing Provider  acetaminophen (TYLENOL) 325 MG tablet Take 650 mg by mouth 3 (three) times daily.   Yes [provider]  aspirin EC 81 MG EC tablet Take 1 tablet (81 mg total) by mouth daily. Swallow whole. 03/31/20  Yes Swayze, Ava, DO  escitalopram (LEXAPRO) 10 MG tablet Take 10 mg by mouth at bedtime.   Yes [provider]  Infant Care Products [facility]) OINT Apply 1 application  topically 3 (three) times daily. Apply to buttocks topically every shift for skin integrity.   Yes [provider]  levothyroxine (SYNTHROID) 125 MCG tablet Take 125 mcg by mouth daily before breakfast.   Yes [provider]  memantine (NAMENDA) 10 MG tablet Take 10 mg by mouth at bedtime.   Yes  [provider]  polyethylene glycol (MIRALAX / GLYCOLAX) 17 g packet Take 17 g by mouth daily.   Yes [provider]  rosuvastatin (CRESTOR) 10 MG tablet Take 10 mg by mouth daily.   Yes [provider]  senna-docusate (SENOKOT-S)  8.6-50 MG tablet Take 2 tablets by mouth 2 (two) times daily. Hold for more than 2 large BM's in 24 hours. Restart for no BM for 24 hours. 03/30/20  Yes Swayze, Ava, DO  feeding supplement (ENSURE ENLIVE / ENSURE PLUS) LIQD Take 237 mLs by mouth 3 (three) times daily between meals. 03/30/20   Swayze, Ava, DO  nitroGLYCERIN (NITROSTAT) 0.4 MG SL tablet Place 0.4 mg under the tongue every 5 (five) minutes as needed for chest pain (max 3 doses).    [provider]  Nutritional Supplements (,FEEDING SUPPLEMENT, PROSOURCE PLUS) liquid Take 30 mLs by mouth 2 (two) times daily between meals. Patient not taking: Reported on 03/08/2022 03/30/20   Swayze, Ava, DO  pantoprazole (PROTONIX) 40 MG tablet Take 1 tablet (40 mg total) by mouth 2 (two) times daily before a meal. Patient not taking: Reported on 03/08/2022 03/30/20   Swayze, Ava, DO  QUEtiapine (SEROQUEL) 50 MG tablet Take 1 tablet (50 mg total) by mouth at bedtime. Patient not taking: Reported on 03/08/2022 03/30/20   Karie Kirks, DO                                                                                                                                    Past Surgical History Past Surgical History:  Procedure Laterality Date   ABDOMINAL HYSTERECTOMY     CHEST TUBE INSERTION Right 03/23/2020   Procedure: CHEST TUBE INSERTION;  Surgeon: Rexene Alberts, MD;  Location: [place];  Service: Thoracic;  Laterality: Right;   IR THORACENTESIS ASP PLEURAL SPACE W/IMG GUIDE  03/22/2020   PACEMAKER IMPLANT N/A 02/22/2022   Procedure: PACEMAKER IMPLANT;  Surgeon: Constance Haw, MD;  Location: [facility];  Service: Cardiovascular;  Laterality: N/A;   THYROID CYST EXCISION     Family History Family History  Problem Relation Age of Onset   Arthritis Mother    Heart failure Mother    Heart attack Father    Cancer Brother     Social History Social History   Tobacco Use   Smoking status: Never   Smokeless tobacco: Never  Substance  Use Topics   Alcohol use: Yes    Alcohol/week: 1.0 standard drink of alcohol    Types: 1 Glasses of wine per week    Comment: just occasional   Drug use: No   Allergies Shellfish allergy and Betadine [povidone iodine]  Review of Systems Review of Systems  All other systems reviewed and are negative.   Physical Exam Vital Signs  I have reviewed the triage vital signs BP (!) 156/63 (BP Location: Right Arm)   Pulse 85   Temp  98.2 F (36.8 C) (Oral)   Resp 16   Ht 5\' 3"  (1.6 m)   Wt 80.6 kg   SpO2 93%   BMI 31.50 kg/m  Physical Exam Vitals and nursing note reviewed.  Constitutional:      General: She is not in acute distress.    Appearance: She is well-developed.  HENT:     Head: Normocephalic and atraumatic.     Mouth/Throat:     Mouth: Mucous membranes are moist.  Eyes:     Pupils: Pupils are equal, round, and reactive to light.  Cardiovascular:     Rate and Rhythm: Regular rhythm. Bradycardia present.     Heart sounds: No murmur heard. Pulmonary:     Effort: Pulmonary effort is normal. No respiratory distress.     Breath sounds: Normal breath sounds.  Abdominal:     General: Abdomen is flat.     Palpations: Abdomen is soft.     Tenderness: There is no abdominal tenderness.  Musculoskeletal:        General: No tenderness.     Right lower leg: No edema.     Left lower leg: No edema.  Skin:    General: Skin is warm and dry.  Neurological:     General: No focal deficit present.     Mental Status: She is alert. Mental status is at baseline.     Comments: Oriented to self, place, situation, not to year  Psychiatric:        Mood and Affect: Mood normal.        Behavior: Behavior normal.     ED Results and Treatments Labs (all labs ordered are listed, but only abnormal results are displayed) Labs Reviewed  BRAIN NATRIURETIC PEPTIDE - Abnormal; Notable for the following components:      Result Value   B Natriuretic Peptide 372.1 (*)    All other  components within normal limits  SURGICAL PCR SCREEN  BASIC METABOLIC PANEL  CBC WITH DIFFERENTIAL/PLATELET  TROPONIN I (HIGH SENSITIVITY)  TROPONIN I (HIGH SENSITIVITY)                                                                                                                          Radiology No results found.  Pertinent labs & imaging results that were available during my care of the patient were reviewed by me and considered in my medical decision making (see MDM for details).  Medications Ordered in ED Medications  gentamicin (GARAMYCIN) 80 mg in sodium chloride 0.9 % 500 mL irrigation (80 mg Irrigation Given 02/22/22 1754)  ceFAZolin (ANCEF) IVPB 2g/100 mL premix (0 g Intravenous Stopped 02/22/22 1810)  ceFAZolin (ANCEF) IVPB 1 g/50 mL premix (1 g Intravenous New Bag/Given 02/23/22 1119)  Procedures .Critical Care  Performed by: Cristie Hem, MD Authorized by: Cristie Hem, MD   Critical care provider statement:    Critical care time (minutes):  30   Critical care time was exclusive of:  Separately billable procedures and treating other patients   Critical care was necessary to treat or prevent imminent or life-threatening deterioration of the following conditions:  Cardiac failure (complete heart block)   Critical care was time spent personally by me on the following activities:  Ordering and performing treatments and interventions, ordering and review of laboratory studies, ordering and review of radiographic studies, discussions with consultants, development of treatment plan with patient or surrogate, pulse oximetry, re-evaluation of patient's condition, examination of patient, review of old charts and obtaining history from patient or surrogate   Care discussed with: admitting provider   .1-3 Lead EKG Interpretation  Performed  by: Cristie Hem, MD Authorized by: Cristie Hem, MD     Interpretation: abnormal     ECG rate:  35   ECG rate assessment: bradycardic     Rhythm: A-V block     Ectopy: PVCs     Conduction: abnormal     Abnormal conduction: 3rd degree AV block     (including critical care time)  Medical Decision Making / ED Course   MDM:  88 year old female presenting to the emergency department with low heart rate.  Patient overall well-appearing, not hypotensive.  Heart rate is very low at 33.  EKG shows what appears to be complete heart block or high-grade AV block.  Will consult cardiology.  Patient is relatively asymptomatic but may require more urgent intervention.  Will obtain basic labs including troponin, electrolytes to evaluate for other causes of bradycardia.  Clinical Course as of 04/07/22 1253  Fri Feb 22, 2022  1216 Discussed with cardiology who will evaluate the patient [WS]  1226 Cardiology at bedside [WS]  1351 Dr. Curt Bears plans to admit patient for pacemaker implantation. Will stay overnight. Pending admission.  [WS]    Clinical Course User Index [WS] Cristie Hem, MD     Additional history obtained: -Additional history obtained from ems -External records from outside source obtained and reviewed including: Chart review including previous notes, labs, imaging, consultation notes including ED visit for fall 03/07/20   Lab Tests: -I ordered, reviewed, and interpreted labs.   The pertinent results include:   Labs Reviewed  BRAIN NATRIURETIC PEPTIDE - Abnormal; Notable for the following components:      Result Value   B Natriuretic Peptide 372.1 (*)    All other components within normal limits  SURGICAL PCR SCREEN  BASIC METABOLIC PANEL  CBC WITH DIFFERENTIAL/PLATELET  TROPONIN I (HIGH SENSITIVITY)  TROPONIN I (HIGH SENSITIVITY)    Notable for mild elevation in BNP  EKG   EKG Interpretation  Date/Time:  Friday February 22 2022 09:36:42  EST Ventricular Rate:  33 PR Interval:    QRS Duration: 158 QT Interval:  575 QTC Calculation: 426 R Axis:   -62 Text Interpretation: Complete heart block with junctional escape ryhthm IVCD, consider atypical RBBB LVH with IVCD, LAD and secondary repol abnrm Inferior infarct, age indeterminate Confirmed by Garnette Gunner [phone]) on 02/22/2022 9:41:33 AM         Imaging Studies ordered: I ordered imaging studies including CXR On my interpretation imaging demonstrates no acute process I independently visualized and interpreted imaging. I agree with the radiologist interpretation   Medicines ordered and prescription drug management: Meds ordered  this encounter  Medications   DISCONTD: chlorhexidine (HIBICLENS) 4 % liquid 4 Application   DISCONTD: chlorhexidine (HIBICLENS) 4 % liquid 4 Application   DISCONTD: 0.9 %  sodium chloride infusion   DISCONTD: 0.9 %  sodium chloride infusion   gentamicin (GARAMYCIN) 80 mg in sodium chloride 0.9 % 500 mL irrigation   ceFAZolin (ANCEF) IVPB 2g/100 mL premix    Order Specific Question:   Indication:    Answer:   Surgical Prophylaxis   DISCONTD: Heparin (Porcine) in NaCl 1000-0.9 UT/500ML-% SOLN   DISCONTD: lidocaine (PF) (XYLOCAINE) 1 % injection   DISCONTD: divalproex (DEPAKOTE) DR tablet 125 mg   DISCONTD: levothyroxine (SYNTHROID) tablet 75 mcg   DISCONTD: rosuvastatin (CRESTOR) tablet 10 mg   DISCONTD: nitroGLYCERIN (NITROSTAT) SL tablet 0.4 mg   DISCONTD: (feeding supplement) PROSource Plus liquid 30 mL   DISCONTD: aspirin EC tablet 81 mg    Swallow whole.     DISCONTD: feeding supplement (ENSURE ENLIVE / ENSURE PLUS) liquid 237 mL   DISCONTD: oxyCODONE (Oxy IR/ROXICODONE) immediate release tablet 2.5 mg   DISCONTD: pantoprazole (PROTONIX) EC tablet 40 mg   DISCONTD: QUEtiapine (SEROQUEL) tablet 50 mg   DISCONTD: senna-docusate (Senokot-S) tablet 2 tablet    Hold for more than 2 large BM's in 24 hours. Restart for no BM for 24  hours.     DISCONTD: acetaminophen (TYLENOL) tablet 325-650 mg   ceFAZolin (ANCEF) IVPB 1 g/50 mL premix    Order Specific Question:   Indication:    Answer:   Surgical Prophylaxis    -I have reviewed the patients home medicines and have made adjustments as needed   Consultations Obtained: I requested consultation with the cardiologist,  and discussed lab and imaging findings as well as pertinent plan - they recommend: admit for PPM placement   Cardiac Monitoring: The patient was maintained on a cardiac monitor.  I personally viewed and interpreted the cardiac monitored which showed an underlying rhythm of: complete heart block  Social Determinants of Health:  Diagnosis or treatment significantly limited by social determinants of health: dementia    Co morbidities that complicate the patient evaluation  Past Medical History:  Diagnosis Date   Abnormal electrocardiogram (ECG) (EKG) 05/07/2018   Abnormal nuclear stress test 07/27/2019   Alzheimer disease ([place])    CAP (community acquired pneumonia) 03/24/2018   Chest pain of uncertain etiology Q000111Q   CHF (congestive heart failure) (HCC)    Complete AV block ([place]) 02/22/2022   Coronary artery disease    Dementia (HCC)    Elevated blood pressure reading 03/07/2020   Fall at home, initial encounter 03/07/2020   GERD (gastroesophageal reflux disease)    Glaucoma    Hypercholesterolemia 09/29/2013   Hypertension    Hypokalemia    Hypothyroidism 09/29/2013   Intractable pain 03/07/2020   Multiple fractures of ribs, right side, initial encounter for closed fracture 03/07/2020   Pleural effusion    Prolonged QT interval 03/07/2020   Right rib fracture 03/07/2020   SBO (small bowel obstruction) ([place]) 03/24/2018      Dispostion: Disposition decision including need for hospitalization was considered, and patient admitted to the hospital.    Final Clinical Impression(s) / ED Diagnoses Final diagnoses:  Complete heart  block ([place])     This chart was dictated using voice recognition software.  Despite best efforts to proofread,  errors can occur which can change the documentation meaning.    Cristie Hem, MD 02/22/22 1354    Garnette Gunner  L, MD 04/07/22 1253

## 2022-02-22 NOTE — ED Notes (Signed)
Cardiology at bedside.

## 2022-02-22 NOTE — Interval H&P Note (Signed)
History and Physical Interval Note:  02/22/2022 1:25 PM  Jordan Horne  has presented today for surgery, with the diagnosis of complete heart block.  The various methods of treatment have been discussed with the patient and family. After consideration of risks, benefits and other options for treatment, the patient has consented to  Procedure(s): PACEMAKER IMPLANT (N/A) as a surgical intervention.  The patient's history has been reviewed, patient examined, no change in status, stable for surgery.  I have reviewed the patient's chart and labs.  Questions were answered to the patient's satisfaction.     Hussain Maimone [employer]

## 2022-02-23 ENCOUNTER — Ambulatory Visit (HOSPITAL_COMMUNITY): Payer: Medicare Other

## 2022-02-23 DIAGNOSIS — E785 Hyperlipidemia, unspecified: Secondary | ICD-10-CM | POA: Diagnosis not present

## 2022-02-23 DIAGNOSIS — Z66 Do not resuscitate: Secondary | ICD-10-CM | POA: Diagnosis not present

## 2022-02-23 DIAGNOSIS — F0280 Dementia in other diseases classified elsewhere without behavioral disturbance: Secondary | ICD-10-CM | POA: Diagnosis not present

## 2022-02-23 DIAGNOSIS — I442 Atrioventricular block, complete: Secondary | ICD-10-CM | POA: Diagnosis not present

## 2022-02-23 NOTE — Plan of Care (Signed)

## 2022-02-23 NOTE — TOC Transition Note (Signed)
Transition of Care [facility]) - CM/SW Discharge Note   Patient Details  Name: Jordan Horne MRN: [id number] Date of Birth: [birth year]  Transition of Care [facility]) CM/SW Contact:  Patrice Paradise, LCSW Phone Number: 02/23/2022, 12:49 PM   Clinical Narrative:      Patient will DC to:?[facility] Anticipated DC date:?02/23/2022 Family notified:?Spouse- Freight forwarder YI:FOYD  Per MD patient ready for DC to [facility].. RN, patient, patient's family, and facility notified of DC. Discharge Summary sent to facility. RN given number for report [phone] ask for April. DC packet on chart. Ambulance transport requested for patient.   CSW signing off.   Judd Lien, [place] [phone]        Patient Goals and CMS Choice        Discharge Placement                       Discharge Plan and Services                                     Social Determinants of Health (SDOH) Interventions     Readmission Risk Interventions   No data to display

## 2022-02-23 NOTE — Discharge Summary (Cosign Needed Addendum)
ELECTROPHYSIOLOGY PROCEDURE DISCHARGE SUMMARY    Patient ID: Jordan Horne,  MRN: [id number], DOB/AGE: [birth year] 88 y.o.  Admit date: 02/22/2022 Discharge date: 02/23/2022  Primary Care Physician: Erskine Emery, NP  Primary Cardiologist: None  Electrophysiologist: Dr. Elberta Fortis   Primary Discharge Diagnosis:  Symptomatic bradycardia status post pacemaker implantation this admission  Secondary Discharge Diagnosis:  CHB  Allergies  Allergen Reactions   Shellfish Allergy    Betadine [Povidone Iodine] Itching     Procedures This Admission:  1.  Implantation of a Abbott Assurity [id number]  (serial number  [id number] ) pacemaker, with Abbott Ultipace [id number] (serial number [id number]) right atrial lead and an Abbott Ultipace [id number] (serial number [id number]) right ventricular lead There were no immediate post procedure complications.   2.  CXR on 02/23/2022 demonstrated no pneumothorax status post device implantation, though did suggests that the RV lead was outside the cardiac silhoutte. However she was asymptomatic and device functioning well. Therefore, plan for watchful waiting.    Brief HPI: Jordan Horne is a 88 y.o. female was referred to electrophysiology in the outpatient setting for  consideration of PPM implantation.  Past medical history includes HLD, HTN.  The patient has had symptomatic bradycardia without reversible causes identified.  Risks, benefits, and alternatives to PPM implantation were reviewed with the patient who wished to proceed.   Hospital Course:  The patient was admitted and underwent implantation of a  Abbott Assurity  dual chamber PPM with details as outlined above.  She was monitored on telemetry overnight which demonstrated appropriate pacing.  Left chest was without hematoma or ecchymosis.  The device was interrogated and found to be functioning normally.  CXR was obtained and noted above.  Wound care, arm mobility, and restrictions were  reviewed with the patient.  The patient was examined and considered stable for discharge to home.    Anticoagulation resumption This patient is not on anticoagulation        Physical Exam: Vitals:   02/22/22 1942 02/23/22 0004 02/23/22 0412 02/23/22 1100  BP: (!) 168/77 (!) 134/54 (!) 152/78 (!) 156/63  Pulse: 80 71 85 85  Resp: 13 17 16 16   Temp: 98.7 F (37.1 C) 98 F (36.7 C) 98.2 F (36.8 C) 98.2 F (36.8 C)  TempSrc: Oral Oral Oral Oral  SpO2: 97% 94% 92% 93%  Weight:      Height:        Exam: per MD note   Labs:   Lab Results  Component Value Date   WBC 8.4 02/22/2022   HGB 13.7 02/22/2022   HCT 41.9 02/22/2022   MCV 92.1 02/22/2022   PLT 179 02/22/2022    Recent Labs  Lab 02/22/22 0945  NA 142  K 4.0  CL 111  CO2 23  BUN 12  CREATININE 0.85  CALCIUM 8.9  GLUCOSE 93    Discharge Medications:  Allergies as of 02/23/2022       Reactions   Shellfish Allergy    Betadine [povidone Iodine] Itching        Medication List     STOP taking these medications    oxyCODONE 5 MG immediate release tablet Commonly known as: Oxy IR/ROXICODONE       TAKE these medications    (feeding supplement) PROSource Plus liquid Take 30 mLs by mouth 2 (two) times daily between meals.   feeding supplement Liqd Take 237 mLs by mouth 3 (three) times daily between meals.   acetaminophen 325 MG  tablet Commonly known as: TYLENOL Take 650 mg by mouth 3 (three) times daily.   aspirin EC 81 MG tablet Take 1 tablet (81 mg total) by mouth daily. Swallow whole.   Dermacloud Oint Apply 1 application  topically 3 (three) times daily. Apply to buttocks topically every shift for skin integrity.   divalproex 125 MG DR tablet Commonly known as: DEPAKOTE Take 125 mg by mouth daily.   escitalopram 10 MG tablet Commonly known as: LEXAPRO Take 10 mg by mouth at bedtime.   levothyroxine 125 MCG tablet Commonly known as: SYNTHROID Take 125 mcg by mouth daily before  breakfast.   memantine 10 MG tablet Commonly known as: NAMENDA Take 10 mg by mouth at bedtime.   nitroGLYCERIN 0.4 MG SL tablet Commonly known as: NITROSTAT Place 0.4 mg under the tongue every 5 (five) minutes as needed for chest pain (max 3 doses). What changed: Another medication with the same name was removed. Continue taking this medication, and follow the directions you see here.   pantoprazole 40 MG tablet Commonly known as: PROTONIX Take 1 tablet (40 mg total) by mouth 2 (two) times daily before a meal.   polyethylene glycol 17 g packet Commonly known as: MIRALAX / GLYCOLAX Take 17 g by mouth daily.   QUEtiapine 50 MG tablet Commonly known as: SEROQUEL Take 1 tablet (50 mg total) by mouth at bedtime.   rosuvastatin 10 MG tablet Commonly known as: CRESTOR Take 10 mg by mouth daily.   senna-docusate 8.6-50 MG tablet Commonly known as: Senokot-S Take 2 tablets by mouth 2 (two) times daily. Hold for more than 2 large BM's in 24 hours. Restart for no BM for 24 hours.               Discharge Care Instructions  (From admission, onward)           Start     Ordered   02/23/22 0000  Discharge wound care:       Comments: Noted in AVS   02/23/22 1039            Disposition:  Discharge Instructions     Call MD for:  redness, tenderness, or signs of infection (pain, swelling, redness, odor or green/yellow discharge around incision site)   Complete by: As directed    Diet - low sodium heart healthy   Complete by: As directed    Discharge wound care:   Complete by: As directed    Noted in AVS   Increase activity slowly   Complete by: As directed        Follow-up Information     [facility] [address] A Dept Of [place]. [facility] Follow up on 03/07/2022.   Specialty: Cardiology Why: at 9:20am for follow up wound check Contact information: [address] [id number] [place]  [address] [phone]        Regan Lemming, MD Follow up on 06/07/2022.   Specialty: Cardiology Why: at 2:45pm for your follow up appt Contact information: [address] [place] [address] [phone]                Duration of Discharge Encounter: Greater than 30 minutes including physician time.  Signed, Cyndi Bender, NP  02/23/2022 1:27 PM  EP attending  Patient seen and examined.  Agree with the findings as noted above.  Please see my note for additional details.  The patient remains asymptomatic after permanent  pacemaker insertion secondary to complete heart block.  Interrogation of her pacemaker demonstrates normal function.  She will be discharged home with usual follow-up.  Lewayne Bunting, MD

## 2022-02-23 NOTE — Progress Notes (Signed)
Rounding Note    Patient Name: Jordan Horne Date of Encounter: 02/23/2022  [facility] Cardiologist: None   Subjective   No chest pain or sob. She is sleeping comfortably.  Inpatient Medications    Scheduled Meds:  (feeding supplement) PROSource Plus  30 mL Oral BID BM   aspirin EC  81 mg Oral Daily   divalproex  125 mg Oral Daily   feeding supplement  237 mL Oral TID BM   levothyroxine  75 mcg Oral Daily   pantoprazole  40 mg Oral BID AC   QUEtiapine  50 mg Oral QHS   rosuvastatin  10 mg Oral Daily   senna-docusate  2 tablet Oral BID   Continuous Infusions:   ceFAZolin (ANCEF) IV 1 g (02/23/22 0709)   PRN Meds: acetaminophen, nitroGLYCERIN, oxyCODONE   Vital Signs    Vitals:   02/22/22 1843 02/22/22 1942 02/23/22 0004 02/23/22 0412  BP: (!) 163/87 (!) 168/77 (!) 134/54 (!) 152/78  Pulse: 80 80 71 85  Resp: 16 13 17 16   Temp: (!) 97.4 F (36.3 C) 98.7 F (37.1 C) 98 F (36.7 C) 98.2 F (36.8 C)  TempSrc: Oral Oral Oral Oral  SpO2: 97% 97% 94% 92%  Weight:      Height:        Intake/Output Summary (Last 24 hours) at 02/23/2022 1012 Last data filed at 02/23/2022 0145 Gross per 24 hour  Intake 65.6 ml  Output --  Net 65.6 ml      02/22/2022    1:54 PM 03/10/2020    2:33 AM 12/02/2019    4:02 PM  Last 3 Weights  Weight (lbs) 177 lb 12.8 oz 186 lb 159 lb 3.2 oz  Weight (kg) 80.65 kg 84.369 kg 72.213 kg      Telemetry    Nsr with ventricular pacing - Personally Reviewed  ECG    Nsr with ventricular pacing - Personally Reviewed  Physical Exam   GEN: No acute distress.  sleeping Neck: No JVD Cardiac: RRR, no murmurs, rubs, or gallops.  Respiratory: Clear to auscultation bilaterally. GI: Soft, nontender, non-distended  MS: No edema; No deformity. Neuro:  Nonfocal but somnolent Psych: Normal affect   Labs    High Sensitivity Troponin:   Recent Labs  Lab 02/22/22 0945 02/22/22 1224  TROPONINIHS 11 9     Chemistry Recent  Labs  Lab 02/22/22 0945  NA 142  K 4.0  CL 111  CO2 23  GLUCOSE 93  BUN 12  CREATININE 0.85  CALCIUM 8.9  GFRNONAA >60  ANIONGAP 8    Lipids No results for input(s): "CHOL", "TRIG", "HDL", "LABVLDL", "LDLCALC", "CHOLHDL" in the last 168 hours.  Hematology Recent Labs  Lab 02/22/22 0945  WBC 8.4  RBC 4.55  HGB 13.7  HCT 41.9  MCV 92.1  MCH 30.1  MCHC 32.7  RDW 15.0  PLT 179   Thyroid No results for input(s): "TSH", "FREET4" in the last 168 hours.  BNP Recent Labs  Lab 02/22/22 0945  BNP 372.1*    DDimer No results for input(s): "DDIMER" in the last 168 hours.   Radiology    DG Chest 2 View  Result Date: 02/23/2022 CLINICAL DATA:  14/04/2021 Cardiac device in situ, other [phone] EXAM: CHEST - 2 VIEW COMPARISON:  02/22/2022 FINDINGS: The mediastinal contours are within normal limits, though difficult to assess given patient rotation. Unchanged cardiomegaly. Interval insertion of left subclavian approach dual lead pacemaker. The right atrial lead appears  in good position. The right ventricular lead projects beyond the margin of the cardiac silhouette on frontal projection. Interval worsening obscuration of the left costophrenic angle. Hazy bibasilar, left greater than right opacities. No evidence pneumothorax. No acute osseous abnormality. IMPRESSION: Tip of the right ventricular pacemaker lead projects beyond the cardiac silhouette on frontal projection raising suspicion for extracardiac location. Overall, positioning is poor and limits evaluation. Recommend repeat chest radiograph with true AP positioning versus non con chest CT for further characterization. These results will be called to the ordering clinician or representative by the Radiologist Assistant, and communication documented in the PACS or [employer]. Electronically Signed   By: Marliss Coots M.D.   On: 02/23/2022 09:12   EP PPM/ICD IMPLANT  Result Date: 02/22/2022 SURGEON:  Loman Brooklyn, MD   PREPROCEDURE  DIAGNOSIS:  complete AV block   POSTPROCEDURE DIAGNOSIS:  complete AV block    PROCEDURES:  1. Pacemaker implantation.   INTRODUCTION:  Jordan Horne is a 88 y.o. female with a history of bradycardia who presents today for pacemaker implantation.  The patient reports intermittent episodes of dizziness over the past few months.  No reversible causes have been identified.  The patient therefore presents today for pacemaker implantation.   DESCRIPTION OF PROCEDURE:  Informed written consent was obtained, and  the patient was brought to the electrophysiology lab in a fasting state.  The patient required no sedation for the procedure today.  The patients left chest was prepped and draped in the usual sterile fashion by the EP lab staff. The skin overlying the left deltopectoral region was infiltrated with lidocaine for local analgesia.  A 4-cm incision was made over the left deltopectoral region.  A left subcutaneous pacemaker pocket was fashioned using a combination of sharp and blunt dissection. Electrocautery was required to assure hemostasis.  RA/RV Lead Placement: The left axillary vein was therefore cannulated.  Through the left axillary vein, a Abbott Ultipace [id number]  (serial number  [id number]) right atrial lead and an Abbott Ultipace [id number] (serial number  [id number]) right ventricular lead were advanced with fluoroscopic visualization into the right atrial appendage and right ventricular apex positions respectively.  Initial atrial lead P- waves measured 2.1 mV with impedance of 592 ohms and a threshold of 0.5 V at 0.5 msec.  Right ventricular lead R-waves measured 5.8 mV with an impedance of 929 ohms and a threshold of 0.5 V at 0.5 msec.  Both leads were secured to the pectoralis fascia using #2-0 silk over the suture sleeves. Device Placement:  The leads were then connected to an Abbott Assurity [id number]  (serial number  [id number] ) pacemaker.  The pocket was irrigated with copious gentamicin solution.  The  pacemaker was then placed into the pocket.  The pocket was then closed in 3 layers with 2.0 Vicryl suture for the 3.0 Vicryl suture subcutaneous and subcuticular layers.  Steri-  Strips and a sterile dressing were then applied. EBL<53ml.  There were no early apparent complications.   CONCLUSIONS:  1. Successful implantation of a Abbott Assurity [id number] dual-chamber pacemaker for symptomatic bradycardia  2. No early apparent complications.       Will Elberta Fortis, MD 02/22/2022 6:05 PM  ECHOCARDIOGRAM COMPLETE  Result Date: 02/22/2022    ECHOCARDIOGRAM REPORT   Patient Name:   Jordan Horne Date of Exam: 02/22/2022 Medical Rec #:  [id number]         Height:       63.0 in Accession #:  [id number]        Weight:       186.0 lb Date of Birth:  [birth year]         BSA:          1.875 m Patient Age:    89 years          BP:           203/51 mmHg Patient Gender: F                 HR:           34 bpm. Exam Location:  Inpatient Procedure: 2D Echo, Color Doppler and Cardiac Doppler STAT ECHO Indications:    I44.2 Complete heart block  History:        Patient has prior history of Echocardiogram examinations, most                 recent 03/31/2018. CHF, CAD; Risk Factors:Dyslipidemia.  Sonographer:    Irving Burton Senior RDCS Referring Phys: [id number] Graciella Freer  Sonographer Comments: Technically difficult due to patient body habitus. IMPRESSIONS  1. Left ventricular ejection fraction, by estimation, is 50 to 55%. The left ventricle has low normal function. The left ventricle has no regional wall motion abnormalities. Left ventricular diastolic parameters are indeterminate.  2. Right ventricular systolic function is mildly reduced. The right ventricular size is normal. There is mildly elevated pulmonary artery systolic pressure.  3. The mitral valve is normal in structure. Trivial mitral valve regurgitation. No evidence of mitral stenosis.  4. The aortic valve is tricuspid. Aortic valve regurgitation is mild. Aortic valve  sclerosis/calcification is present, without any evidence of aortic stenosis.  5. Aortic dilatation noted. There is borderline dilatation of the aortic root, measuring 38 mm.  6. The inferior vena cava is normal in size with greater than 50% respiratory variability, suggesting right atrial pressure of 3 mmHg. FINDINGS  Left Ventricle: Left ventricular ejection fraction, by estimation, is 50 to 55%. The left ventricle has low normal function. The left ventricle has no regional wall motion abnormalities. The left ventricular internal cavity size was normal in size. There is no left ventricular hypertrophy. Left ventricular diastolic parameters are indeterminate. Right Ventricle: The right ventricular size is normal. Right ventricular systolic function is mildly reduced. There is mildly elevated pulmonary artery systolic pressure. The tricuspid regurgitant velocity is 3.19 m/s, and with an assumed right atrial pressure of 3 mmHg, the estimated right ventricular systolic pressure is 43.7 mmHg. Left Atrium: Left atrial size was normal in size. Right Atrium: Right atrial size was normal in size. Pericardium: There is no evidence of pericardial effusion. Mitral Valve: The mitral valve is normal in structure. Mild mitral annular calcification. Trivial mitral valve regurgitation. No evidence of mitral valve stenosis. Tricuspid Valve: The tricuspid valve is normal in structure. Tricuspid valve regurgitation is mild . No evidence of tricuspid stenosis. Aortic Valve: The aortic valve is tricuspid. Aortic valve regurgitation is mild. Aortic valve sclerosis/calcification is present, without any evidence of aortic stenosis. Pulmonic Valve: The pulmonic valve was normal in structure. Pulmonic valve regurgitation is trivial. No evidence of pulmonic stenosis. Aorta: Aortic dilatation noted. There is borderline dilatation of the aortic root, measuring 38 mm. Venous: The inferior vena cava is normal in size with greater than 50%  respiratory variability, suggesting right atrial pressure of 3 mmHg. IAS/Shunts: The interatrial septum was not well visualized.  LEFT VENTRICLE PLAX 2D LVIDd:         4.80  cm LVIDs:         3.30 cm LV PW:         1.10 cm LV IVS:        1.00 cm LVOT diam:     1.90 cm LV SV:         84 LV SV Index:   45 LVOT Area:     2.84 cm  RIGHT VENTRICLE RV S prime:     13.60 cm/s TAPSE (M-mode): 2.1 cm LEFT ATRIUM             Index        RIGHT ATRIUM           Index LA diam:        3.50 cm 1.87 cm/m   RA Area:     15.40 cm LA Vol (A2C):   68.0 ml 36.27 ml/m  RA Volume:   32.60 ml  17.39 ml/m LA Vol (A4C):   40.6 ml 21.65 ml/m LA Biplane Vol: 52.6 ml 28.05 ml/m  AORTIC VALVE LVOT Vmax:   109.00 cm/s LVOT Vmean:  74.900 cm/s LVOT VTI:    0.298 m  AORTA Ao Root diam: 3.80 cm Ao Asc diam:  4.00 cm TRICUSPID VALVE TR Peak grad:   40.7 mmHg TR Vmax:        319.00 cm/s  SHUNTS Systemic VTI:  0.30 m Systemic Diam: 1.90 cm Olga MillersBrian Crenshaw MD Electronically signed by Olga MillersBrian Crenshaw MD Signature Date/Time: 02/22/2022/2:08:58 PM    Final    DG Chest 1 View  Result Date: 02/22/2022 CLINICAL DATA:  Bradycardia EXAM: CHEST  1 VIEW COMPARISON:  Chest radiograph dated 03/29/2020 FINDINGS: Normal lung volumes. Retrocardiac patchy opacities. No pleural effusion or pneumothorax. Enlarged cardiomediastinal silhouette. The visualized skeletal structures are unremarkable. Right upper quadrant surgical clips. IMPRESSION: 1. Retrocardiac patchy opacities, which may represent atelectasis. Aspiration or pneumonia can be considered in the appropriate clinical setting. 2. Cardiomegaly. Electronically Signed   By: Agustin CreeLimin  Xu M.D.   On: 02/22/2022 10:01    Cardiac Studies   none  Patient Profile     88 y.o. female admitted with CHB s/p PPM insertion.  Assessment & Plan    CHB - she is stable after PPM insertion. PPM interrogation under my direction demonstrates normal DDD PM function. CXR suggests that the RV lead is outside the cardiac  silhoutte. However she is asymptomatic and her numbers look good. We will undergo watchful waiting. Dementia - not very conversant with me today. I suspect that she is near end stage.   For questions or updates, please contact [facility] Please consult [email or web address] for contact info under     Signed, Lewayne BuntingGregg Riko Lumsden, MD  02/23/2022, 10:12 AM

## 2022-02-23 NOTE — Progress Notes (Signed)
Report called to [facility] receiving Nurse Fuller Canada, LPN. Questions answered and no concerns verbalized. Awaiting PTAR to transfer patient out.

## 2022-02-23 NOTE — Social Work (Signed)
CSW was alerted that pt was ready for DC. CSW attempted to speak with pt in regards to her DC plan however was asleep and did not awaken and RN tried to assist. RN confirmed that pt has confusion as well. CSW spoke with pt's spouse Annice Pih and he confirmed that pt is a LTC resident at [facility] and the plan is for her to return.   CSW spoke with April at [facility] and alerted her of pt's DC today. CSW sent DC summary over the hub.

## 2022-02-25 ENCOUNTER — Encounter (HOSPITAL_COMMUNITY): Payer: Self-pay | Admitting: Cardiology

## 2022-03-01 DIAGNOSIS — F0280 Dementia in other diseases classified elsewhere without behavioral disturbance: Secondary | ICD-10-CM | POA: Insufficient documentation

## 2022-03-01 DIAGNOSIS — H409 Unspecified glaucoma: Secondary | ICD-10-CM | POA: Insufficient documentation

## 2022-03-01 DIAGNOSIS — K219 Gastro-esophageal reflux disease without esophagitis: Secondary | ICD-10-CM | POA: Insufficient documentation

## 2022-03-01 DIAGNOSIS — I509 Heart failure, unspecified: Secondary | ICD-10-CM | POA: Insufficient documentation

## 2022-03-01 DIAGNOSIS — I2510 Atherosclerotic heart disease of native coronary artery without angina pectoris: Secondary | ICD-10-CM | POA: Insufficient documentation

## 2022-03-01 DIAGNOSIS — I10 Essential (primary) hypertension: Secondary | ICD-10-CM | POA: Insufficient documentation

## 2022-03-01 DIAGNOSIS — J90 Pleural effusion, not elsewhere classified: Secondary | ICD-10-CM | POA: Insufficient documentation

## 2022-03-01 DIAGNOSIS — E876 Hypokalemia: Secondary | ICD-10-CM | POA: Insufficient documentation

## 2022-03-07 ENCOUNTER — Ambulatory Visit: Payer: Medicare Other | Attending: Internal Medicine

## 2022-03-07 DIAGNOSIS — I442 Atrioventricular block, complete: Secondary | ICD-10-CM | POA: Diagnosis not present

## 2022-03-07 NOTE — Patient Instructions (Signed)

## 2022-03-08 ENCOUNTER — Encounter: Payer: Self-pay | Admitting: Cardiology

## 2022-03-08 ENCOUNTER — Ambulatory Visit: Payer: Medicare Other | Attending: Cardiology | Admitting: Cardiology

## 2022-03-08 VITALS — BP 118/80 | HR 76 | Ht 63.0 in | Wt 165.0 lb

## 2022-03-08 DIAGNOSIS — I442 Atrioventricular block, complete: Secondary | ICD-10-CM

## 2022-03-08 DIAGNOSIS — Z950 Presence of cardiac pacemaker: Secondary | ICD-10-CM

## 2022-03-08 DIAGNOSIS — E782 Mixed hyperlipidemia: Secondary | ICD-10-CM

## 2022-03-08 DIAGNOSIS — I351 Nonrheumatic aortic (valve) insufficiency: Secondary | ICD-10-CM

## 2022-03-08 HISTORY — DX: Mixed hyperlipidemia: E78.2

## 2022-03-08 HISTORY — DX: Nonrheumatic aortic (valve) insufficiency: I35.1

## 2022-03-08 HISTORY — DX: Presence of cardiac pacemaker: Z95.0

## 2022-03-08 LAB — CUP PACEART INCLINIC DEVICE CHECK
Battery Remaining Longevity: 129 mo
Battery Voltage: 3.07 V
Brady Statistic RA Percent Paced: 0.44 %
Brady Statistic RV Percent Paced: 99.15 %
Date Time Interrogation Session: 20231214092700
Implantable Lead Connection Status: 753985
Implantable Lead Connection Status: 753985
Implantable Lead Implant Date: 20231201
Implantable Lead Implant Date: 20231201
Implantable Lead Location: 753859
Implantable Lead Location: 753860
Implantable Pulse Generator Implant Date: 20231201
Lead Channel Impedance Value: 437.5 Ohm
Lead Channel Impedance Value: 600 Ohm
Lead Channel Pacing Threshold Amplitude: 0.5 V
Lead Channel Pacing Threshold Amplitude: 0.5 V
Lead Channel Pacing Threshold Amplitude: 0.75 V
Lead Channel Pacing Threshold Amplitude: 0.75 V
Lead Channel Pacing Threshold Pulse Width: 0.5 ms
Lead Channel Pacing Threshold Pulse Width: 0.5 ms
Lead Channel Pacing Threshold Pulse Width: 0.5 ms
Lead Channel Pacing Threshold Pulse Width: 0.5 ms
Lead Channel Sensing Intrinsic Amplitude: 5 mV
Lead Channel Sensing Intrinsic Amplitude: 8 mV
Lead Channel Setting Pacing Amplitude: 0.875
Lead Channel Setting Pacing Amplitude: 3.5 V
Lead Channel Setting Pacing Pulse Width: 0.5 ms
Lead Channel Setting Sensing Sensitivity: 4 mV
Pulse Gen Model: 2272
Pulse Gen Serial Number: 8111810

## 2022-03-08 NOTE — Progress Notes (Signed)
Cardiology Office Note:    Date:  03/08/2022   ID:  Jordan Horne, DOB [birth year], MRN [id number]  PCP:  Erskine Emery, NP  Cardiologist:  Garwin Brothers, MD   Referring MD: Erskine Emery, NP    ASSESSMENT:    1. Complete AV block (HCC)   2. Presence of permanent cardiac pacemaker   3. Mixed dyslipidemia   4. Mild aortic regurgitation    PLAN:    In order of problems listed above:  Primary prevention stressed with the patient.  Hospitalization records were reviewed including the hospitalization with the following the complete heart block. Her blood pressure is stable. Complete heart block: Post permanent pacemaker insertion by electrophysiology colleagues.  The records were evaluated. Mild aortic regurgitation: Stable at this time.  Medical management. Mixed lipidemia: On lipid-lowering medications.  Lipids reviewed.  Diet emphasized.Patient will be seen in follow-up appointment in 6 months or earlier if the patient has any concerns   Medication Adjustments/Labs and Tests Ordered: Current medicines are reviewed at length with the patient today.  Concerns regarding medicines are outlined above.  No orders of the defined types were placed in this encounter.  No orders of the defined types were placed in this encounter.    No chief complaint on file.    History of Present Illness:    Jordan Horne is a 88 y.o. female.  Patient has past medical history of complete heart block for which she recently underwent permanent pacing.  I reviewed those records.  She has unfortunately had a significant fall with hemothorax was treated also several months ago.  She is a frail lady.  She has issues with dementia.  She has a history of mixed dyslipidemia.  Currently she denies any problems.  She has brought in in a wheelchair.  Past Medical History:  Diagnosis Date   Abnormal electrocardiogram (ECG) (EKG) 05/07/2018   Abnormal nuclear stress test 07/27/2019   Alzheimer  disease (HCC)    CAP (community acquired pneumonia) 03/24/2018   Chest pain of uncertain etiology 09/29/2013   CHF (congestive heart failure) (HCC)    Complete AV block (HCC) 02/22/2022   Coronary artery disease    Dementia (HCC)    Elevated blood pressure reading 03/07/2020   Fall at home, initial encounter 03/07/2020   GERD (gastroesophageal reflux disease)    Glaucoma    Hypercholesterolemia 09/29/2013   Hypertension    Hypokalemia    Hypothyroidism 09/29/2013   Intractable pain 03/07/2020   Multiple fractures of ribs, right side, initial encounter for closed fracture 03/07/2020   Pleural effusion    Prolonged QT interval 03/07/2020   Right rib fracture 03/07/2020   SBO (small bowel obstruction) (HCC) 03/24/2018    Past Surgical History:  Procedure Laterality Date   ABDOMINAL HYSTERECTOMY     CHEST TUBE INSERTION Right 03/23/2020   Procedure: CHEST TUBE INSERTION;  Surgeon: Purcell Nails, MD;  Location: [facility] OR;  Service: Thoracic;  Laterality: Right;   IR THORACENTESIS ASP PLEURAL SPACE W/IMG GUIDE  03/22/2020   PACEMAKER IMPLANT N/A 02/22/2022   Procedure: PACEMAKER IMPLANT;  Surgeon: Regan Lemming, MD;  Location: [facility] INVASIVE CV LAB;  Service: Cardiovascular;  Laterality: N/A;   THYROID CYST EXCISION      Current Medications: Current Meds  Medication Sig   acetaminophen (TYLENOL) 325 MG tablet Take 650 mg by mouth 3 (three) times daily.   aspirin EC 81 MG EC tablet Take 1 tablet (81 mg total) by  mouth daily. Swallow whole.   escitalopram (LEXAPRO) 10 MG tablet Take 10 mg by mouth at bedtime.   Infant Care Products [facility]) OINT Apply 1 application  topically 3 (three) times daily. Apply to buttocks topically every shift for skin integrity.   levothyroxine (SYNTHROID) 125 MCG tablet Take 125 mcg by mouth daily before breakfast.   memantine (NAMENDA) 10 MG tablet Take 10 mg by mouth at bedtime.   nitroGLYCERIN (NITROSTAT) 0.4 MG SL tablet Place 0.4 mg under  the tongue every 5 (five) minutes as needed for chest pain (max 3 doses).   polyethylene glycol (MIRALAX / GLYCOLAX) 17 g packet Take 17 g by mouth daily.   rosuvastatin (CRESTOR) 10 MG tablet Take 10 mg by mouth daily.   senna-docusate (SENOKOT-S) 8.6-50 MG tablet Take 2 tablets by mouth 2 (two) times daily. Hold for more than 2 large BM's in 24 hours. Restart for no BM for 24 hours.     Allergies:   Shellfish allergy and Betadine [povidone iodine]   Social History   Socioeconomic History   Marital status: Married    Spouse name: Not on file   Number of children: Not on file   Years of education: Not on file   Highest education level: Not on file  Occupational History   Not on file  Tobacco Use   Smoking status: Never   Smokeless tobacco: Never  Substance and Sexual Activity   Alcohol use: Yes    Alcohol/week: 1.0 standard drink of alcohol    Types: 1 Glasses of wine per week    Comment: just occasional   Drug use: No   Sexual activity: Not on file  Other Topics Concern   Not on file  Social History Narrative   Not on file   Social Determinants of Health   Financial Resource Strain: Not on file  Food Insecurity: Not on file  Transportation Needs: Not on file  Physical Activity: Not on file  Stress: Not on file  Social Connections: Not on file     Family History: The patient's family history includes Arthritis in her mother; Cancer in her brother; Heart attack in her father; Heart failure in her mother.  ROS:   Please see the history of present illness.    All other systems reviewed and are negative.  EKGs/Labs/Other Studies Reviewed:    The following studies were reviewed today: I discussed my findings with the patient at length   Recent Labs: 02/22/2022: B Natriuretic Peptide 372.1; BUN 12; Creatinine, Ser 0.85; Hemoglobin 13.7; Platelets 179; Potassium 4.0; Sodium 142  Recent Lipid Panel    Component Value Date/Time   CHOL 142 10/12/2019 0935   TRIG 94  10/12/2019 0935   HDL 62 10/12/2019 0935   CHOLHDL 2.3 10/12/2019 0935   LDLCALC 63 10/12/2019 0935    Physical Exam:    VS:  BP 118/80   Pulse 76   Ht 5\' 3"  (1.6 m)   Wt 165 lb (74.8 kg)   SpO2 93%   BMI 29.23 kg/m     Wt Readings from Last 3 Encounters:  03/08/22 165 lb (74.8 kg)  02/22/22 177 lb 12.8 oz (80.6 kg)  03/10/20 186 lb (84.4 kg)     GEN: Patient is in no acute distress HEENT: Normal NECK: No JVD; No carotid bruits LYMPHATICS: No lymphadenopathy CARDIAC: Hear sounds regular, 2/6 systolic murmur at the apex. RESPIRATORY:  Clear to auscultation without rales, wheezing or rhonchi  ABDOMEN: Soft, non-tender, non-distended MUSCULOSKELETAL:  No edema; No deformity  SKIN: Warm and dry NEUROLOGIC:  Alert and oriented x 3 PSYCHIATRIC:  Normal affect   Signed, Garwin Brothers, MD  03/08/2022 2:32 PM    [facility]

## 2022-03-08 NOTE — Progress Notes (Signed)
Wound check appointment. Steri-strips removed. Wound without redness or edema. Incision edges approximated, wound well healed. Normal device function. Thresholds, sensing, and impedances consistent with implant measurements. Device programmed at 3.5V/auto capture programmed on for extra safety margin until 3 month visit. Histogram distribution appropriate for patient and level of activity. No mode switches or high ventricular rates noted. Patient educated about wound care, arm mobility, lifting restrictions. ROV in 3 months with implanting physician.  Upon arrival, Pt had not removed her sling or outer bandage.  These were removed at this visit along with steristrips.  Will contact [employer] to initiate remote monitoring.  Advised nursing staff to provide ROM exercises for left arm.

## 2022-03-08 NOTE — Patient Instructions (Signed)
Medication Instructions:  Your physician recommends that you continue on your current medications as directed. Please refer to the Current Medication list given to you today.  *If you need a refill on your cardiac medications before your next appointment, please call your pharmacy*   Lab Work: None If you have labs (blood work) drawn today and your tests are completely normal, you will receive your results only by: MyChart Message (if you have MyChart) OR A paper copy in the mail If you have any lab test that is abnormal or we need to change your treatment, we will call you to review the results.   Testing/Procedures: None   Follow-Up: At  [facility], you and your health needs are our priority.  As part of our continuing mission to provide you with exceptional heart care, we have created designated Provider Care Teams.  These Care Teams include your primary Cardiologist (physician) and Advanced Practice Providers (APPs -  Physician Assistants and Nurse Practitioners) who all work together to provide you with the care you need, when you need it.  We recommend signing up for the patient portal called "MyChart".  Sign up information is provided on this After Visit Summary.  MyChart is used to connect with patients for Virtual Visits (Telemedicine).  Patients are able to view lab/test results, encounter notes, upcoming appointments, etc.  Non-urgent messages can be sent to your provider as well.   To learn more about what you can do with MyChart, go to [email or web address].    Your next appointment:   6 month(s)  The format for your next appointment:   In Person  Provider:   Rajan Revankar, MD    Other Instructions None  Important Information About Sugar       

## 2022-05-28 ENCOUNTER — Ambulatory Visit (INDEPENDENT_AMBULATORY_CARE_PROVIDER_SITE_OTHER): Payer: Medicare Other

## 2022-05-28 DIAGNOSIS — I442 Atrioventricular block, complete: Secondary | ICD-10-CM

## 2022-05-28 LAB — CUP PACEART REMOTE DEVICE CHECK
Battery Remaining Longevity: 103 mo
Battery Remaining Percentage: 95.5 %
Battery Voltage: 3.02 V
Brady Statistic AP VP Percent: 1 %
Brady Statistic AP VS Percent: 1 %
Brady Statistic AS VP Percent: 98 %
Brady Statistic AS VS Percent: 1.2 %
Brady Statistic RA Percent Paced: 1 %
Brady Statistic RV Percent Paced: 99 %
Date Time Interrogation Session: 20240305020014
Implantable Lead Connection Status: 753985
Implantable Lead Connection Status: 753985
Implantable Lead Implant Date: 20231201
Implantable Lead Implant Date: 20231201
Implantable Lead Location: 753859
Implantable Lead Location: 753860
Implantable Pulse Generator Implant Date: 20231201
Lead Channel Impedance Value: 400 Ohm
Lead Channel Impedance Value: 690 Ohm
Lead Channel Pacing Threshold Amplitude: 0.625 V
Lead Channel Pacing Threshold Amplitude: 0.75 V
Lead Channel Pacing Threshold Pulse Width: 0.5 ms
Lead Channel Pacing Threshold Pulse Width: 0.5 ms
Lead Channel Sensing Intrinsic Amplitude: 12 mV
Lead Channel Sensing Intrinsic Amplitude: 5 mV
Lead Channel Setting Pacing Amplitude: 0.875
Lead Channel Setting Pacing Amplitude: 3.5 V
Lead Channel Setting Pacing Pulse Width: 0.5 ms
Lead Channel Setting Sensing Sensitivity: 4 mV
Pulse Gen Model: 2272
Pulse Gen Serial Number: 8111810

## 2022-06-07 ENCOUNTER — Encounter: Payer: Self-pay | Admitting: Cardiology

## 2022-06-07 ENCOUNTER — Ambulatory Visit: Payer: Medicare Other | Attending: Cardiology | Admitting: Cardiology

## 2022-06-07 VITALS — BP 110/68 | HR 84

## 2022-06-07 DIAGNOSIS — I10 Essential (primary) hypertension: Secondary | ICD-10-CM | POA: Diagnosis present

## 2022-06-07 DIAGNOSIS — I442 Atrioventricular block, complete: Secondary | ICD-10-CM | POA: Insufficient documentation

## 2022-06-07 LAB — CUP PACEART INCLINIC DEVICE CHECK
Battery Remaining Longevity: 127 mo
Battery Voltage: 3.02 V
Brady Statistic RA Percent Paced: 0.61 %
Brady Statistic RV Percent Paced: 99 %
Date Time Interrogation Session: 20240315164343
Implantable Lead Connection Status: 753985
Implantable Lead Connection Status: 753985
Implantable Lead Implant Date: 20231201
Implantable Lead Implant Date: 20231201
Implantable Lead Location: 753859
Implantable Lead Location: 753860
Implantable Pulse Generator Implant Date: 20231201
Lead Channel Impedance Value: 412.5 Ohm
Lead Channel Impedance Value: 687.5 Ohm
Lead Channel Pacing Threshold Amplitude: 0.75 V
Lead Channel Pacing Threshold Amplitude: 0.75 V
Lead Channel Pacing Threshold Amplitude: 0.75 V
Lead Channel Pacing Threshold Amplitude: 0.75 V
Lead Channel Pacing Threshold Pulse Width: 0.5 ms
Lead Channel Pacing Threshold Pulse Width: 0.5 ms
Lead Channel Pacing Threshold Pulse Width: 0.5 ms
Lead Channel Pacing Threshold Pulse Width: 0.5 ms
Lead Channel Sensing Intrinsic Amplitude: 12 mV
Lead Channel Sensing Intrinsic Amplitude: 5 mV
Lead Channel Setting Pacing Amplitude: 0.875
Lead Channel Setting Pacing Amplitude: 2.5 V
Lead Channel Setting Pacing Pulse Width: 0.5 ms
Lead Channel Setting Sensing Sensitivity: 4 mV
Pulse Gen Model: 2272
Pulse Gen Serial Number: 8111810

## 2022-06-07 NOTE — Patient Instructions (Signed)
Medication Instructions:  Your physician recommends that you continue on your current medications as directed. Please refer to the Current Medication list given to you today.  *If you need a refill on your cardiac medications before your next appointment, please call your pharmacy*   Lab Work: None ordered   Testing/Procedures: None ordered   Follow-Up: At [facility], you and your health needs are our priority.  As part of our continuing mission to provide you with exceptional heart care, we have created designated Provider Care Teams.  These Care Teams include your primary Cardiologist (physician) and Advanced Practice Providers (APPs -  Physician Assistants and Nurse Practitioners) who all work together to provide you with the care you need, when you need it.   Remote monitoring is used to monitor your Pacemaker or ICD from home. This monitoring reduces the number of office visits required to check your device to one time per year. It allows [place] to keep an eye on the functioning of your device to ensure it is working properly. You are scheduled for a device check from home on 08/27/2022. You may send your transmission at any time that day. If you have a wireless device, the transmission will be sent automatically. After your physician reviews your transmission, you will receive a postcard with your next transmission date.  Your next appointment:   1 year(s)  The format for your next appointment:   In Person  Provider:   Allegra Lai, MD    Thank you for choosing [facility]!!   Trinidad Curet, RN [phone]

## 2022-06-07 NOTE — Progress Notes (Signed)
Electrophysiology Office Note   Date:  06/07/2022   ID:  Jordan Horne, DOB October 23, 1932, MRN [id number]  PCP:  Rhea Bleacher, NP  Cardiologist: Revankar Primary Electrophysiologist:  Constance Haw, MD    Chief Complaint: Pacemaker   History of Present Illness: Jordan Horne is a 89 y.o. female who is being seen today for the evaluation of pacemaker at the request of Rhea Bleacher, NP. Presenting today for electrophysiology evaluation.  She has a history significant hypertension, hyperlipidemia, intermittent angina.  She presented to the hospital 02/22/2022 with complete heart block.  She is status post Abbott dual-chamber pacemaker implanted 02/23/2022.  Today, she denies symptoms of palpitations, chest pain, shortness of breath, orthopnea, PND, lower extremity edema, claudication, dizziness, presyncope, syncope, bleeding, or neurologic sequela. The patient is tolerating medications without difficulties.    Past Medical History:  Diagnosis Date   Abnormal electrocardiogram (ECG) (EKG) 05/07/2018   Abnormal nuclear stress test 07/27/2019   Alzheimer disease ([place])    CAP (community acquired pneumonia) 03/24/2018   Chest pain of uncertain etiology [id number]   CHF (congestive heart failure) ([place])    Complete AV block ([place]) 02/22/2022   Coronary artery disease    Dementia (HCC)    Elevated blood pressure reading 03/07/2020   Fall at home, initial encounter 03/07/2020   GERD (gastroesophageal reflux disease)    Glaucoma    Hypercholesterolemia 09/29/2013   Hypertension    Hypokalemia    Hypothyroidism 09/29/2013   Intractable pain 03/07/2020   Multiple fractures of ribs, right side, initial encounter for closed fracture 03/07/2020   Pleural effusion    Prolonged QT interval 03/07/2020   Right rib fracture 03/07/2020   SBO (small bowel obstruction) ([place]) 03/24/2018   Past Surgical History:  Procedure Laterality Date   ABDOMINAL HYSTERECTOMY     CHEST TUBE  INSERTION Right 03/23/2020   Procedure: CHEST TUBE INSERTION;  Surgeon: Rexene Alberts, MD;  Location: [place];  Service: Thoracic;  Laterality: Right;   IR THORACENTESIS ASP PLEURAL SPACE W/IMG GUIDE  03/22/2020   PACEMAKER IMPLANT N/A 02/22/2022   Procedure: PACEMAKER IMPLANT;  Surgeon: Constance Haw, MD;  Location: [place] CV LAB;  Service: Cardiovascular;  Laterality: N/A;   THYROID CYST EXCISION       Current Outpatient Medications  Medication Sig Dispense Refill   acetaminophen (TYLENOL) 325 MG tablet Take 650 mg by mouth 3 (three) times daily.     aspirin EC 81 MG EC tablet Take 1 tablet (81 mg total) by mouth daily. Swallow whole. 30 tablet 11   escitalopram (LEXAPRO) 10 MG tablet Take 10 mg by mouth at bedtime.     famotidine (PEPCID) 20 MG tablet Take 20 mg by mouth daily.     feeding supplement (ENSURE ENLIVE / ENSURE PLUS) LIQD Take 237 mLs by mouth 3 (three) times daily between meals. 237 mL 12   Infant Care Products ([employer]) OINT Apply 1 application  topically 3 (three) times daily. Apply to buttocks topically every shift for skin integrity.     levothyroxine (SYNTHROID) 125 MCG tablet Take 125 mcg by mouth daily before breakfast.     memantine (NAMENDA) 10 MG tablet Take 10 mg by mouth at bedtime.     nitroGLYCERIN (NITROSTAT) 0.4 MG SL tablet Place 0.4 mg under the tongue every 5 (five) minutes as needed for chest pain (max 3 doses).     Nutritional Supplements (,FEEDING SUPPLEMENT, PROSOURCE PLUS) liquid Take 30 mLs by mouth  2 (two) times daily between meals. 887 mL 0   pantoprazole (PROTONIX) 40 MG tablet Take 1 tablet (40 mg total) by mouth 2 (two) times daily before a meal. 60 tablet 0   polyethylene glycol (MIRALAX / GLYCOLAX) 17 g packet Take 17 g by mouth daily.     QUEtiapine (SEROQUEL) 50 MG tablet Take 1 tablet (50 mg total) by mouth at bedtime. 30 tablet 0   rosuvastatin (CRESTOR) 10 MG tablet Take 10 mg by mouth daily.     senna-docusate (SENOKOT-S)  8.6-50 MG tablet Take 2 tablets by mouth 2 (two) times daily. Hold for more than 2 large BM's in 24 hours. Restart for no BM for 24 hours. 60 tablet 0   No current facility-administered medications for this visit.    Allergies:   Shellfish allergy and Betadine [povidone iodine]   Social History:  The patient  reports that she has never smoked. She has never used smokeless tobacco. She reports current alcohol use of about 1.0 standard drink of alcohol per week. She reports that she does not use drugs.   Family History:  The patient's family history includes Arthritis in her mother; Cancer in her brother; Heart attack in her father; Heart failure in her mother.    ROS:  Please see the history of present illness.   Otherwise, review of systems is positive for none.   All other systems are reviewed and negative.    PHYSICAL EXAM: VS:  BP 110/68   Pulse 84   SpO2 95%  , BMI There is no height or weight on file to calculate BMI. GEN: Well nourished, well developed, in no acute distress  HEENT: normal  Neck: no JVD, carotid bruits, or masses Cardiac: RRR; no murmurs, rubs, or gallops,no edema  Respiratory:  clear to auscultation bilaterally, normal work of breathing GI: soft, nontender, nondistended, + BS MS: no deformity or atrophy  Skin: warm and dry, device pocket is well healed Neuro:  Strength and sensation are intact Psych: euthymic mood, full affect  EKG:  EKG is ordered today. Personal review of the ekg ordered shows A sense, v pace  Device interrogation is reviewed today in detail.  See PaceArt for details.   Recent Labs: 02/22/2022: B Natriuretic Peptide 372.1; BUN 12; Creatinine, Ser 0.85; Hemoglobin 13.7; Platelets 179; Potassium 4.0; Sodium 142    Lipid Panel     Component Value Date/Time   CHOL 142 10/12/2019 0935   TRIG 94 10/12/2019 0935   HDL 62 10/12/2019 0935   CHOLHDL 2.3 10/12/2019 0935   LDLCALC 63 10/12/2019 0935     Wt Readings from Last 3  Encounters:  03/08/22 165 lb (74.8 kg)  02/22/22 177 lb 12.8 oz (80.6 kg)  03/10/20 186 lb (84.4 kg)      Other studies Reviewed: Additional studies/ records that were reviewed today include: TTE 02/22/2022 Review of the above records today demonstrates:   1. Left ventricular ejection fraction, by estimation, is 50 to 55%. The  left ventricle has low normal function. The left ventricle has no regional  wall motion abnormalities. Left ventricular diastolic parameters are  indeterminate.   2. Right ventricular systolic function is mildly reduced. The right  ventricular size is normal. There is mildly elevated pulmonary artery  systolic pressure.   3. The mitral valve is normal in structure. Trivial mitral valve  regurgitation. No evidence of mitral stenosis.   4. The aortic valve is tricuspid. Aortic valve regurgitation is mild.  Aortic valve  sclerosis/calcification is present, without any evidence of  aortic stenosis.   5. Aortic dilatation noted. There is borderline dilatation of the aortic  root, measuring 38 mm.   6. The inferior vena cava is normal in size with greater than 50%  respiratory variability, suggesting right atrial pressure of 3 mmHg.    ASSESSMENT AND PLAN:  1.  Heart block: Status post Abbott dual-chamber pacemaker implanted 02/22/2022.  Device functioning appropriately.  Threshold, sensing, impedance within normal limits.  No changes.  2.  Hypertension: well controlled  3.  Hyperlipidemia: Continue statin per primary cardiology    Current medicines are reviewed at length with the patient today.   The patient does not have concerns regarding her medicines.  The following changes were made today:  none  Labs/ tests ordered today include:  Orders Placed This Encounter  Procedures   EKG 12-Lead     Disposition:   FU with Madailein Londo 1 year  Signed, Ameya Kutz Meredith Leeds, MD  06/07/2022 4:10 PM     [address] [place] [id number] [phone] (office) [phone] (fax)

## 2022-07-05 NOTE — Progress Notes (Signed)
Remote pacemaker transmission.   

## 2022-08-27 ENCOUNTER — Ambulatory Visit (INDEPENDENT_AMBULATORY_CARE_PROVIDER_SITE_OTHER): Payer: Medicare Other

## 2022-08-27 DIAGNOSIS — I442 Atrioventricular block, complete: Secondary | ICD-10-CM | POA: Diagnosis not present

## 2022-08-27 LAB — CUP PACEART REMOTE DEVICE CHECK
Battery Remaining Longevity: 118 mo
Battery Remaining Percentage: 95.5 %
Battery Voltage: 3.02 V
Brady Statistic AP VP Percent: 1 %
Brady Statistic AP VS Percent: 1 %
Brady Statistic AS VP Percent: 99 %
Brady Statistic AS VS Percent: 1 %
Brady Statistic RA Percent Paced: 1 %
Brady Statistic RV Percent Paced: 99 %
Date Time Interrogation Session: 20240604020014
Implantable Lead Connection Status: 753985
Implantable Lead Connection Status: 753985
Implantable Lead Implant Date: 20231201
Implantable Lead Implant Date: 20231201
Implantable Lead Location: 753859
Implantable Lead Location: 753860
Implantable Pulse Generator Implant Date: 20231201
Lead Channel Impedance Value: 410 Ohm
Lead Channel Impedance Value: 730 Ohm
Lead Channel Pacing Threshold Amplitude: 0.75 V
Lead Channel Pacing Threshold Amplitude: 0.75 V
Lead Channel Pacing Threshold Pulse Width: 0.5 ms
Lead Channel Pacing Threshold Pulse Width: 0.5 ms
Lead Channel Sensing Intrinsic Amplitude: 12 mV
Lead Channel Sensing Intrinsic Amplitude: 5 mV
Lead Channel Setting Pacing Amplitude: 1 V
Lead Channel Setting Pacing Amplitude: 2.5 V
Lead Channel Setting Pacing Pulse Width: 0.5 ms
Lead Channel Setting Sensing Sensitivity: 4 mV
Pulse Gen Model: 2272
Pulse Gen Serial Number: 8111810

## 2022-09-19 NOTE — Progress Notes (Signed)
Remote pacemaker transmission.   

## 2022-11-26 ENCOUNTER — Ambulatory Visit (INDEPENDENT_AMBULATORY_CARE_PROVIDER_SITE_OTHER): Payer: Medicare Other

## 2022-11-26 DIAGNOSIS — I442 Atrioventricular block, complete: Secondary | ICD-10-CM

## 2022-11-27 LAB — CUP PACEART REMOTE DEVICE CHECK
Battery Remaining Longevity: 115 mo
Battery Remaining Percentage: 94 %
Battery Voltage: 3.01 V
Brady Statistic AP VP Percent: 1.1 %
Brady Statistic AP VS Percent: 1 %
Brady Statistic AS VP Percent: 98 %
Brady Statistic AS VS Percent: 1 %
Brady Statistic RA Percent Paced: 1 %
Brady Statistic RV Percent Paced: 99 %
Date Time Interrogation Session: 20240903020012
Implantable Lead Connection Status: 753985
Implantable Lead Connection Status: 753985
Implantable Lead Implant Date: 20231201
Implantable Lead Implant Date: 20231201
Implantable Lead Location: 753859
Implantable Lead Location: 753860
Implantable Pulse Generator Implant Date: 20231201
Lead Channel Impedance Value: 410 Ohm
Lead Channel Impedance Value: 700 Ohm
Lead Channel Pacing Threshold Amplitude: 0.75 V
Lead Channel Pacing Threshold Amplitude: 0.75 V
Lead Channel Pacing Threshold Pulse Width: 0.5 ms
Lead Channel Pacing Threshold Pulse Width: 0.5 ms
Lead Channel Sensing Intrinsic Amplitude: 12 mV
Lead Channel Sensing Intrinsic Amplitude: 5 mV
Lead Channel Setting Pacing Amplitude: 1 V
Lead Channel Setting Pacing Amplitude: 2.5 V
Lead Channel Setting Pacing Pulse Width: 0.5 ms
Lead Channel Setting Sensing Sensitivity: 4 mV
Pulse Gen Model: 2272
Pulse Gen Serial Number: 8111810

## 2022-12-09 NOTE — Progress Notes (Signed)
Remote pacemaker transmission.   

## 2022-12-11 ENCOUNTER — Encounter: Payer: Self-pay | Admitting: Cardiology

## 2022-12-11 ENCOUNTER — Ambulatory Visit: Payer: Medicare Other | Attending: Cardiology | Admitting: Cardiology

## 2022-12-11 VITALS — BP 132/80 | HR 74 | Ht 63.0 in | Wt 181.8 lb

## 2022-12-11 DIAGNOSIS — G309 Alzheimer's disease, unspecified: Secondary | ICD-10-CM | POA: Insufficient documentation

## 2022-12-11 DIAGNOSIS — E7800 Pure hypercholesterolemia, unspecified: Secondary | ICD-10-CM | POA: Diagnosis present

## 2022-12-11 DIAGNOSIS — Z950 Presence of cardiac pacemaker: Secondary | ICD-10-CM | POA: Diagnosis present

## 2022-12-11 DIAGNOSIS — I351 Nonrheumatic aortic (valve) insufficiency: Secondary | ICD-10-CM | POA: Diagnosis present

## 2022-12-11 DIAGNOSIS — F0280 Dementia in other diseases classified elsewhere without behavioral disturbance: Secondary | ICD-10-CM | POA: Diagnosis present

## 2022-12-11 NOTE — Patient Instructions (Signed)
Medication Instructions:  Your physician recommends that you continue on your current medications as directed. Please refer to the Current Medication list given to you today.  *If you need a refill on your cardiac medications before your next appointment, please call your pharmacy*   Lab Work: None ordered If you have labs (blood work) drawn today and your tests are completely normal, you will receive your results only by: MyChart Message (if you have MyChart) OR A paper copy in the mail If you have any lab test that is abnormal or we need to change your treatment, we will call you to review the results.   Testing/Procedures: None ordered   Follow-Up: At [facility], you and your health needs are our priority.  As part of our continuing mission to provide you with exceptional heart care, we have created designated Provider Care Teams.  These Care Teams include your primary Cardiologist (physician) and Advanced Practice Providers (APPs -  Physician Assistants and Nurse Practitioners) who all work together to provide you with the care you need, when you need it.  We recommend signing up for the patient portal called "MyChart".  Sign up information is provided on this After Visit Summary.  MyChart is used to connect with patients for Virtual Visits (Telemedicine).  Patients are able to view lab/test results, encounter notes, upcoming appointments, etc.  Non-urgent messages can be sent to your provider as well.   To learn more about what you can do with MyChart, go to [email or web address].    Your next appointment:   9 month(s)  The format for your next appointment:   In Person  Provider:   Belva Crome, MD    Other Instructions none  Important Information About Sugar

## 2022-12-11 NOTE — Progress Notes (Signed)
Cardiology Office Note:    Date:  12/11/2022   ID:  Jordan Horne, DOB 1932/05/29, MRN [id number]  PCP:  Erskine Emery, NP  Cardiologist:  Garwin Brothers, MD   Referring MD: Erskine Emery, NP    ASSESSMENT:    1. Presence of permanent cardiac pacemaker   2. Hypercholesterolemia   3. Alzheimer disease (HCC)   4. Mild aortic regurgitation    PLAN:    In order of problems listed above:  Primary prevention stressed with the patient.  Importance of compliance with diet medication stressed and patient verbalized standing. Mild aortic regurgitation: Stable.  We will continue to monitor.  Echo report reviewed. Permanent pacemaker: Electrophysiology notes reviewed and discussed with the patient.  Appears stable. Mixed dyslipidemia: On lipid-lowering medications.  She has blood work done at her facility on a regular basis and followed by primary care. Patient will be seen in follow-up appointment in 6 months or earlier if the patient has any concerns.    Medication Adjustments/Labs and Tests Ordered: Current medicines are reviewed at length with the patient today.  Concerns regarding medicines are outlined above.  No orders of the defined types were placed in this encounter.  No orders of the defined types were placed in this encounter.    No chief complaint on file.    History of Present Illness:    Jordan Horne is a 89 y.o. female.  She is part in a wheelchair from nursing home/assisted living.  She has history of mixed dyslipidemia, permanent pacemaker and mild aortic regurgitation.  This history of coronary artery disease details are not clear.  Patient has significant dementia.  She denies any chest pain orthopnea or PND.  At the time of my evaluation, the patient is alert awake oriented and in no distress.  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   Coronary artery disease    Dementia (HCC)    Elevated blood pressure reading 03/07/2020   Fall at home, initial encounter 03/07/2020   GERD (gastroesophageal reflux disease)    Glaucoma    Hypercholesterolemia 09/29/2013   Hypertension    Hypokalemia    Hypothyroidism 09/29/2013   Intractable pain 03/07/2020   Mild aortic regurgitation 03/08/2022   Mixed dyslipidemia 03/08/2022   Multiple fractures of ribs, right side, initial encounter for closed fracture 03/07/2020   Pleural effusion    Presence of permanent cardiac pacemaker 03/08/2022   Prolonged QT interval 03/07/2020   Right rib fracture 03/07/2020   SBO (small bowel obstruction) (HCC) 03/24/2018    Past Surgical History:  Procedure Laterality Date   ABDOMINAL HYSTERECTOMY     CHEST TUBE INSERTION Right 03/23/2020   Procedure: CHEST TUBE INSERTION;  Surgeon: Purcell Nails, MD;  Location: [facility] OR;  Service: Thoracic;  Laterality: Right;   IR THORACENTESIS ASP PLEURAL SPACE W/IMG GUIDE  03/22/2020   PACEMAKER IMPLANT N/A 02/22/2022   Procedure: PACEMAKER IMPLANT;  Surgeon: Regan Lemming, MD;  Location: [facility] INVASIVE CV LAB;  Service: Cardiovascular;  Laterality: N/A;   THYROID CYST EXCISION      Current Medications: Current Meds  Medication Sig   acetaminophen (TYLENOL) 325 MG tablet Take 650 mg by mouth  3 (three) times daily.   aspirin EC 81 MG EC tablet Take 1 tablet (81 mg total) by mouth daily. Swallow whole.   escitalopram (LEXAPRO) 5 MG tablet Take 5 mg by mouth daily.   famotidine (PEPCID) 20 MG tablet Take 20 mg by mouth daily.   feeding supplement (ENSURE ENLIVE / ENSURE PLUS) LIQD Take 237 mLs by mouth 3 (three) times daily between meals.   Infant Care Products [facility]) OINT Apply 1 application  topically 3 (three) times  daily. Apply to buttocks topically every shift for skin integrity.   levothyroxine (SYNTHROID) 125 MCG tablet Take 125 mcg by mouth daily before breakfast.   memantine (NAMENDA) 10 MG tablet Take 10 mg by mouth at bedtime.   nitroGLYCERIN (NITROSTAT) 0.4 MG SL tablet Place 0.4 mg under the tongue every 5 (five) minutes as needed for chest pain (max 3 doses).   Nutritional Supplements (,FEEDING SUPPLEMENT, PROSOURCE PLUS) liquid Take 30 mLs by mouth 2 (two) times daily between meals.   pantoprazole (PROTONIX) 40 MG tablet Take 1 tablet (40 mg total) by mouth 2 (two) times daily before a meal.   polyethylene glycol (MIRALAX / GLYCOLAX) 17 g packet Take 17 g by mouth daily.   QUEtiapine (SEROQUEL) 50 MG tablet Take 1 tablet (50 mg total) by mouth at bedtime.   rosuvastatin (CRESTOR) 10 MG tablet Take 10 mg by mouth daily.   senna-docusate (SENOKOT-S) 8.6-50 MG tablet Take 2 tablets by mouth 2 (two) times daily. Hold for more than 2 large BM's in 24 hours. Restart for no BM for 24 hours.     Allergies:   Shellfish allergy and Betadine [povidone iodine]   Social History   Socioeconomic History   Marital status: Married    Spouse name: Not on file   Number of children: Not on file   Years of education: Not on file   Highest education level: Not on file  Occupational History   Not on file  Tobacco Use   Smoking status: Never   Smokeless tobacco: Never  Substance and Sexual Activity   Alcohol use: Yes    Alcohol/week: 1.0 standard drink of alcohol    Types: 1 Glasses of wine per week    Comment: just occasional   Drug use: No   Sexual activity: Not on file  Other Topics Concern   Not on file  Social History Narrative   Not on file   Social Determinants of Health   Financial Resource Strain: Not on file  Food Insecurity: Not on file  Transportation Needs: Not on file  Physical Activity: Not on file  Stress: Not on file  Social Connections: Not on file     Family History: The  patient's family history includes Arthritis in her mother; Cancer in her brother; Heart attack in her father; Heart failure in her mother.  ROS:   Please see the history of present illness.    All other systems reviewed and are negative.  EKGs/Labs/Other Studies Reviewed:    The following studies were reviewed today: I discussed my findings with the patient at length.   Recent Labs: 02/22/2022: B Natriuretic Peptide 372.1; BUN 12; Creatinine, Ser 0.85; Hemoglobin 13.7; Platelets 179; Potassium 4.0; Sodium 142  Recent Lipid Panel    Component Value Date/Time   CHOL 142 10/12/2019 0935   TRIG 94 10/12/2019 0935   HDL 62 10/12/2019 0935   CHOLHDL 2.3 10/12/2019 0935   LDLCALC 63 10/12/2019 0935    Physical Exam:  VS:  BP 132/80   Pulse 74   Ht 5\' 3"  (1.6 m)   Wt 181 lb 12.8 oz (82.5 kg)   SpO2 90%   BMI 32.20 kg/m     Wt Readings from Last 3 Encounters:  12/11/22 181 lb 12.8 oz (82.5 kg)  03/08/22 165 lb (74.8 kg)  02/22/22 177 lb 12.8 oz (80.6 kg)     GEN: Patient is in no acute distress HEENT: Normal NECK: No JVD; No carotid bruits LYMPHATICS: No lymphadenopathy CARDIAC: Hear sounds regular, 2/6 systolic murmur at the apex. RESPIRATORY:  Clear to auscultation without rales, wheezing or rhonchi  ABDOMEN: Soft, non-tender, non-distended MUSCULOSKELETAL:  No edema; No deformity  SKIN: Warm and dry NEUROLOGIC:  Alert and oriented x 3 PSYCHIATRIC:  Normal affect   Signed, Garwin Brothers, MD  12/11/2022 11:59 AM    [facility]

## 2023-02-25 ENCOUNTER — Ambulatory Visit (INDEPENDENT_AMBULATORY_CARE_PROVIDER_SITE_OTHER): Payer: Medicare Other

## 2023-02-25 DIAGNOSIS — I442 Atrioventricular block, complete: Secondary | ICD-10-CM

## 2023-02-26 LAB — CUP PACEART REMOTE DEVICE CHECK
Battery Remaining Longevity: 112 mo
Battery Remaining Percentage: 92 %
Battery Voltage: 3.01 V
Brady Statistic AP VP Percent: 1.2 %
Brady Statistic AP VS Percent: 1 %
Brady Statistic AS VP Percent: 98 %
Brady Statistic AS VS Percent: 1 %
Brady Statistic RA Percent Paced: 1 %
Brady Statistic RV Percent Paced: 99 %
Date Time Interrogation Session: 20241203020015
Implantable Lead Connection Status: 753985
Implantable Lead Connection Status: 753985
Implantable Lead Implant Date: 20231201
Implantable Lead Implant Date: 20231201
Implantable Lead Location: 753859
Implantable Lead Location: 753860
Implantable Pulse Generator Implant Date: 20231201
Lead Channel Impedance Value: 410 Ohm
Lead Channel Impedance Value: 710 Ohm
Lead Channel Pacing Threshold Amplitude: 0.75 V
Lead Channel Pacing Threshold Amplitude: 0.875 V
Lead Channel Pacing Threshold Pulse Width: 0.5 ms
Lead Channel Pacing Threshold Pulse Width: 0.5 ms
Lead Channel Sensing Intrinsic Amplitude: 12 mV
Lead Channel Sensing Intrinsic Amplitude: 4.9 mV
Lead Channel Setting Pacing Amplitude: 1 V
Lead Channel Setting Pacing Amplitude: 2.5 V
Lead Channel Setting Pacing Pulse Width: 0.5 ms
Lead Channel Setting Sensing Sensitivity: 4 mV
Pulse Gen Model: 2272
Pulse Gen Serial Number: 8111810

## 2023-05-27 ENCOUNTER — Ambulatory Visit (INDEPENDENT_AMBULATORY_CARE_PROVIDER_SITE_OTHER): Payer: Medicare Other

## 2023-05-27 DIAGNOSIS — I442 Atrioventricular block, complete: Secondary | ICD-10-CM | POA: Diagnosis not present

## 2023-05-31 NOTE — Progress Notes (Unsigned)
  Cardiology Office Note:  .   Date:  05/31/2023  ID:  Jordan Horne, DOB 1932-11-23, MRN [id number] PCP: Erskine Emery, NP  [facility] Providers Cardiologist:  None Electrophysiologist:  Will Jorja Loa, MD {  History of Present Illness: .   Jordan Horne is a 89 y.o. female w/PMHx of  hypothyroid, alzheimer's, dementia HTN, HLD, CHB w/PPM CAD of unclear/unknown degree  She saw Dr. Elberta Fortis 06/07/22, doing well, device functioning normally, no changes were made  She saw Dr. Tomie China 12/11/22, residing in an ALF, dementia described as significant, cardiac stable, no changes were made   Today's visit is scheduled as an annual visit  ROS:   She is accompanied by Jordan Horne staff member. No known medical concerns  The patient reports a nocturnal CP that she has a few times a month, that she will get up and take ASA for  She has known dementia, (is non-ambulatory, no ASA/meds available to her independently), so suspect this may be a historical symptom. Un clear She tells me she is doing fine  Device information SJM dual chamber PPM implanted 02/22/22   Studies Reviewed: Marland Kitchen    EKG done today and reviewed by myself:  SR/V paced 79bpm  DEVICE interrogation done today and reviewed by myself Battery and lead measurements are good 53 AMS All available EGMs reviewed Majority are PATs, longest 22 seconds, rare AFl;utters that are very short, longest 6 seconds   TTE 02/22/2022  1. Left ventricular ejection fraction, by estimation, is 50 to 55%. The  left ventricle has low normal function. The left ventricle has no regional  wall motion abnormalities. Left ventricular diastolic parameters are  indeterminate.   2. Right ventricular systolic function is mildly reduced. The right  ventricular size is normal. There is mildly elevated pulmonary artery  systolic pressure.   3. The mitral valve is normal in structure. Trivial mitral valve  regurgitation. No evidence of  mitral stenosis.   4. The aortic valve is tricuspid. Aortic valve regurgitation is mild.  Aortic valve sclerosis/calcification is present, without any evidence of  aortic stenosis.   5. Aortic dilatation noted. There is borderline dilatation of the aortic  root, measuring 38 mm.   6. The inferior vena cava is normal in size with greater than 50%  respiratory variability, suggesting right atrial pressure of 3 mmHg.      Risk Assessment/Calculations:    Physical Exam:   VS:  There were no vitals taken for this visit.   Wt Readings from Last 3 Encounters:  12/11/22 181 lb 12.8 oz (82.5 kg)  03/08/22 165 lb (74.8 kg)  02/22/22 177 lb 12.8 oz (80.6 kg)    GEN: Well nourished, well developed in no acute distress NECK: No JVD; No carotid bruits CARDIAC: RR, no murmurs, rubs, gallops RESPIRATORY:  CTA b/l without rales, wheezing or rhonchi  ABDOMEN: Soft, non-tender, non-distended EXTREMITIES: No edema; No deformity   PPM site: is stable, no thinning, fluctuation, tethering  ASSESSMENT AND PLAN: .    PPM Intactc function No programming changes made  HTN Looks OK     Dispo: remotes as usual, in clinic again in a year, sooner if needed  Signed, Jordan Pigeon, PA-C

## 2023-06-02 LAB — CUP PACEART REMOTE DEVICE CHECK
Battery Remaining Longevity: 107 mo
Battery Remaining Percentage: 89 %
Battery Voltage: 3.01 V
Brady Statistic AP VP Percent: 1.1 %
Brady Statistic AP VS Percent: 1 %
Brady Statistic AS VP Percent: 98 %
Brady Statistic AS VS Percent: 1 %
Brady Statistic RA Percent Paced: 1 %
Brady Statistic RV Percent Paced: 99 %
Date Time Interrogation Session: 20250304020016
Implantable Lead Connection Status: 753985
Implantable Lead Connection Status: 753985
Implantable Lead Implant Date: 20231201
Implantable Lead Implant Date: 20231201
Implantable Lead Location: 753859
Implantable Lead Location: 753860
Implantable Pulse Generator Implant Date: 20231201
Lead Channel Impedance Value: 410 Ohm
Lead Channel Impedance Value: 650 Ohm
Lead Channel Pacing Threshold Amplitude: 0.75 V
Lead Channel Pacing Threshold Amplitude: 1.125 V
Lead Channel Pacing Threshold Pulse Width: 0.5 ms
Lead Channel Pacing Threshold Pulse Width: 0.5 ms
Lead Channel Sensing Intrinsic Amplitude: 12 mV
Lead Channel Sensing Intrinsic Amplitude: 5 mV
Lead Channel Setting Pacing Amplitude: 1 V
Lead Channel Setting Pacing Amplitude: 2.5 V
Lead Channel Setting Pacing Pulse Width: 0.5 ms
Lead Channel Setting Sensing Sensitivity: 4 mV
Pulse Gen Model: 2272
Pulse Gen Serial Number: 8111810

## 2023-06-03 ENCOUNTER — Encounter: Payer: Self-pay | Admitting: Physician Assistant

## 2023-06-03 ENCOUNTER — Ambulatory Visit: Payer: Medicare Other | Attending: Physician Assistant | Admitting: Physician Assistant

## 2023-06-03 VITALS — BP 104/60 | HR 79 | Ht 63.0 in

## 2023-06-03 DIAGNOSIS — I442 Atrioventricular block, complete: Secondary | ICD-10-CM | POA: Insufficient documentation

## 2023-06-03 DIAGNOSIS — Z950 Presence of cardiac pacemaker: Secondary | ICD-10-CM | POA: Diagnosis present

## 2023-06-03 DIAGNOSIS — I10 Essential (primary) hypertension: Secondary | ICD-10-CM | POA: Diagnosis present

## 2023-06-03 LAB — CUP PACEART INCLINIC DEVICE CHECK
Battery Remaining Longevity: 115 mo
Battery Voltage: 3.01 V
Brady Statistic RA Percent Paced: 0.97 %
Brady Statistic RV Percent Paced: 99.53 %
Date Time Interrogation Session: 20250311145216
Implantable Lead Connection Status: 753985
Implantable Lead Connection Status: 753985
Implantable Lead Implant Date: 20231201
Implantable Lead Implant Date: 20231201
Implantable Lead Location: 753859
Implantable Lead Location: 753860
Implantable Pulse Generator Implant Date: 20231201
Lead Channel Impedance Value: 425 Ohm
Lead Channel Impedance Value: 712.5 Ohm
Lead Channel Pacing Threshold Amplitude: 0.75 V
Lead Channel Pacing Threshold Amplitude: 1 V
Lead Channel Pacing Threshold Pulse Width: 0.5 ms
Lead Channel Pacing Threshold Pulse Width: 0.5 ms
Lead Channel Sensing Intrinsic Amplitude: 12 mV
Lead Channel Sensing Intrinsic Amplitude: 4.5 mV
Lead Channel Setting Pacing Amplitude: 1 V
Lead Channel Setting Pacing Amplitude: 2.5 V
Lead Channel Setting Pacing Pulse Width: 0.5 ms
Lead Channel Setting Sensing Sensitivity: 4 mV
Pulse Gen Model: 2272
Pulse Gen Serial Number: 8111810

## 2023-06-03 NOTE — Patient Instructions (Signed)
Medication Instructions:   Your physician recommends that you continue on your current medications as directed. Please refer to the Current Medication list given to you today.   *If you need a refill on your cardiac medications before your next appointment, please call your pharmacy*   Lab Work: NONE ORDERED  TODAY    If you have labs (blood work) drawn today and your tests are completely normal, you will receive your results only by: MyChart Message (if you have MyChart) OR A paper copy in the mail If you have any lab test that is abnormal or we need to change your treatment, we will call you to review the results.   Testing/Procedures: NONE ORDERED  TODAY     Follow-Up: At [facility], you and your health needs are our priority.  As part of our continuing mission to provide you with exceptional heart care, we have created designated Provider Care Teams.  These Care Teams include your primary Cardiologist (physician) and Advanced Practice Providers (APPs -  Physician Assistants and Nurse Practitioners) who all work together to provide you with the care you need, when you need it.  We recommend signing up for the patient portal called "MyChart".  Sign up information is provided on this After Visit Summary.  MyChart is used to connect with patients for Virtual Visits (Telemedicine).  Patients are able to view lab/test results, encounter notes, upcoming appointments, etc.  Non-urgent messages can be sent to your provider as well.   To learn more about what you can do with MyChart, go to [email or web address].    Your next appointment:   1 year(s)  Provider:   You may see Will Martin Camnitz, MD or one of the following Advanced Practice Providers on your designated Care Team:   Renee Ursuy, PA-C  Other Instructions  

## 2023-07-02 NOTE — Progress Notes (Signed)
 Remote pacemaker transmission.

## 2023-07-02 NOTE — Addendum Note (Signed)
 Addended by: Geralyn Flash D on: 07/02/2023 12:48 PM   Modules accepted: Orders

## 2023-08-26 ENCOUNTER — Ambulatory Visit (INDEPENDENT_AMBULATORY_CARE_PROVIDER_SITE_OTHER): Payer: Medicare Other

## 2023-08-26 DIAGNOSIS — I442 Atrioventricular block, complete: Secondary | ICD-10-CM | POA: Diagnosis not present

## 2023-08-27 LAB — CUP PACEART REMOTE DEVICE CHECK
Battery Remaining Longevity: 104 mo
Battery Remaining Percentage: 87 %
Battery Voltage: 3.01 V
Brady Statistic AP VP Percent: 1 %
Brady Statistic AP VS Percent: 1 %
Brady Statistic AS VP Percent: 99 %
Brady Statistic AS VS Percent: 1 %
Brady Statistic RA Percent Paced: 1 %
Brady Statistic RV Percent Paced: 99 %
Date Time Interrogation Session: 20250603113829
Implantable Lead Connection Status: 753985
Implantable Lead Connection Status: 753985
Implantable Lead Implant Date: 20231201
Implantable Lead Implant Date: 20231201
Implantable Lead Location: 753859
Implantable Lead Location: 753860
Implantable Pulse Generator Implant Date: 20231201
Lead Channel Impedance Value: 410 Ohm
Lead Channel Impedance Value: 690 Ohm
Lead Channel Pacing Threshold Amplitude: 0.75 V
Lead Channel Pacing Threshold Amplitude: 1 V
Lead Channel Pacing Threshold Pulse Width: 0.5 ms
Lead Channel Pacing Threshold Pulse Width: 0.5 ms
Lead Channel Sensing Intrinsic Amplitude: 12 mV
Lead Channel Sensing Intrinsic Amplitude: 5 mV
Lead Channel Setting Pacing Amplitude: 1 V
Lead Channel Setting Pacing Amplitude: 2.5 V
Lead Channel Setting Pacing Pulse Width: 0.5 ms
Lead Channel Setting Sensing Sensitivity: 4 mV
Pulse Gen Model: 2272
Pulse Gen Serial Number: 8111810

## 2023-09-03 ENCOUNTER — Ambulatory Visit: Payer: Self-pay | Admitting: Cardiology

## 2023-09-09 ENCOUNTER — Other Ambulatory Visit: Payer: Self-pay

## 2023-09-10 ENCOUNTER — Encounter: Payer: Self-pay | Admitting: Cardiology

## 2023-09-10 ENCOUNTER — Ambulatory Visit: Attending: Cardiology | Admitting: Cardiology

## 2023-09-10 VITALS — BP 114/70 | HR 76 | Ht 63.0 in | Wt 198.6 lb

## 2023-09-10 DIAGNOSIS — I351 Nonrheumatic aortic (valve) insufficiency: Secondary | ICD-10-CM | POA: Diagnosis present

## 2023-09-10 DIAGNOSIS — I10 Essential (primary) hypertension: Secondary | ICD-10-CM | POA: Insufficient documentation

## 2023-09-10 DIAGNOSIS — E782 Mixed hyperlipidemia: Secondary | ICD-10-CM | POA: Diagnosis present

## 2023-09-10 DIAGNOSIS — Z950 Presence of cardiac pacemaker: Secondary | ICD-10-CM | POA: Diagnosis present

## 2023-09-10 NOTE — Patient Instructions (Signed)

## 2023-09-10 NOTE — Progress Notes (Signed)
 Cardiology Office Note:    Date:  09/10/2023   ID:  Jordan Horne, DOB [birth year], MRN [id number]  PCP:  Marce Sensing, NP  Cardiologist:  Nelia Balzarine, MD   Referring MD: Marce Sensing, NP    ASSESSMENT:    1. Primary hypertension   2. Mild aortic regurgitation   3. Mixed dyslipidemia   4. Presence of permanent cardiac pacemaker    PLAN:    In order of problems listed above:  Primary prevention stressed with the patient.  Importance of compliance with diet medication stressed and patient verbalized standing. Mild aortic regurgitation: Stable at this time.  I reviewed echo report with her. Essential hypertension: Blood pressure stable and diet was emphasized.  Lifestyle modification urged.  Salt intake and diet decrease issues were revisited. Permanent pacemaker: Stable.  Electrophysiology notes reviewed. History: Weight reduction stressed and diet emphasized. Patient will be seen in follow-up appointment in 12 months or earlier if the patient has any concerns.    Medication Adjustments/Labs and Tests Ordered: Current medicines are reviewed at length with the patient today.  Concerns regarding medicines are outlined above.  No orders of the defined types were placed in this encounter.  No orders of the defined types were placed in this encounter.    No chief complaint on file.    History of Present Illness:    Jordan Horne is a 89 y.o. female.  Patient has past medical history of permanent pacemaker, essential hypertension, mixed dyslipidemia and mild aortic regurgitation.  She denies any problems at this time and takes care of activities of daily living.  No chest pain orthopnea or PND.  She is brought in from assisted living facility.  At the time of my evaluation, the patient is alert awake oriented and in no distress.  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   Coronary artery disease    Dementia (HCC)    Elevated blood pressure reading 03/07/2020   Fall at home, initial encounter 03/07/2020   GERD (gastroesophageal reflux disease)    Glaucoma    Hypercholesterolemia 09/29/2013   Hypertension    Hypokalemia    Hypothyroidism 09/29/2013   Intractable pain 03/07/2020   Mild aortic regurgitation 03/08/2022   Mixed dyslipidemia 03/08/2022   Multiple fractures of ribs, right side, initial encounter for closed fracture 03/07/2020   Pleural effusion    Presence of permanent cardiac pacemaker 03/08/2022   Prolonged QT interval 03/07/2020   Right rib fracture 03/07/2020   SBO (small bowel obstruction) (HCC) 03/24/2018    Past Surgical History:  Procedure Laterality Date   ABDOMINAL HYSTERECTOMY     CHEST TUBE INSERTION Right 03/23/2020   Procedure: CHEST TUBE INSERTION;  Surgeon: Gardenia Jump, MD;  Location: [facility] OR;  Service: Thoracic;  Laterality: Right;   IR THORACENTESIS ASP PLEURAL SPACE W/IMG GUIDE  03/22/2020   PACEMAKER IMPLANT N/A 02/22/2022   Procedure: PACEMAKER IMPLANT;  Surgeon: Lei Pump, MD;  Location: [facility] INVASIVE CV LAB;  Service: Cardiovascular;  Laterality: N/A;   THYROID CYST EXCISION      Current Medications: Current Meds  Medication Sig   acetaminophen  (TYLENOL ) 325 MG tablet Take 650 mg by mouth  3 (three) times daily.   aspirin  EC 81 MG EC tablet Take 1 tablet (81 mg total) by mouth daily. Swallow whole.   carbamide peroxide (DEBROX) 6.5 % OTIC solution Place 3 drops into the right ear at bedtime.   chlorhexidine  (PERIDEX ) 0.12 % solution Use as directed 15 mLs in the mouth or throat 2 (two) times daily.   escitalopram (LEXAPRO) 5 MG tablet Take 5 mg by mouth daily.   famotidine (PEPCID) 20 MG  tablet Take 20 mg by mouth daily.   levothyroxine  (SYNTHROID ) 150 MCG tablet Take 150 mcg by mouth daily.   memantine (NAMENDA) 10 MG tablet Take 10 mg by mouth at bedtime.   polyethylene glycol (MIRALAX  / GLYCOLAX ) 17 g packet Take 17 g by mouth daily.   rosuvastatin  (CRESTOR ) 10 MG tablet Take 10 mg by mouth daily.   senna-docusate (SENOKOT-S) 8.6-50 MG tablet Take 2 tablets by mouth 2 (two) times daily. Hold for more than 2 large BM's in 24 hours. Restart for no BM for 24 hours.     Allergies:   Shellfish allergy and Betadine [povidone iodine]   Social History   Socioeconomic History   Marital status: Married    Spouse name: Not on file   Number of children: Not on file   Years of education: Not on file   Highest education level: Not on file  Occupational History   Not on file  Tobacco Use   Smoking status: Never   Smokeless tobacco: Never  Substance and Sexual Activity   Alcohol use: Yes    Alcohol/week: 1.0 standard drink of alcohol    Types: 1 Glasses of wine per week    Comment: just occasional   Drug use: No   Sexual activity: Not on file  Other Topics Concern   Not on file  Social History Narrative   Not on file   Social Drivers of Health   Financial Resource Strain: Not on file  Food Insecurity: Not on file  Transportation Needs: Not on file  Physical Activity: Not on file  Stress: Not on file  Social Connections: Not on file     Family History: The patient's family history includes Arthritis in her mother; Cancer in her brother; Heart attack in her father; Heart failure in her mother.  ROS:   Please see the history of present illness.    All other systems reviewed and are negative.  EKGs/Labs/Other Studies Reviewed:    The following studies were reviewed today: I discussed my findings with the patient at length   Recent Labs: No results found for requested labs within last 365 days.  Recent Lipid Panel    Component Value Date/Time   CHOL 142  10/12/2019 0935   TRIG 94 10/12/2019 0935   HDL 62 10/12/2019 0935   CHOLHDL 2.3 10/12/2019 0935   LDLCALC 63 10/12/2019 0935    Physical Exam:    VS:  BP 114/70   Pulse 76   Ht 5' 3 (1.6 m)   Wt 198 lb 9.6 oz (90.1 kg)   SpO2 94%   BMI 35.18 kg/m     Wt Readings from Last 3 Encounters:  09/10/23 198 lb 9.6 oz (90.1 kg)  12/11/22 181 lb 12.8 oz (82.5 kg)  03/08/22 165 lb (74.8 kg)     GEN: Patient is in no acute distress HEENT: Normal NECK: No JVD; No carotid bruits LYMPHATICS: No lymphadenopathy CARDIAC: Hear sounds regular, 2/6 systolic murmur at the apex. RESPIRATORY:  Clear  to auscultation without rales, wheezing or rhonchi  ABDOMEN: Soft, non-tender, non-distended MUSCULOSKELETAL:  No edema; No deformity  SKIN: Warm and dry NEUROLOGIC:  Alert and oriented x 3 PSYCHIATRIC:  Normal affect   Signed, Nelia Balzarine, MD  09/10/2023 10:56 AM    [facility]

## 2023-10-23 NOTE — Addendum Note (Signed)
 Addended by: VICCI SELLER A on: 10/23/2023 08:36 AM   Modules accepted: Orders

## 2023-10-23 NOTE — Progress Notes (Signed)
 Remote pacemaker transmission.

## 2023-11-25 ENCOUNTER — Ambulatory Visit (INDEPENDENT_AMBULATORY_CARE_PROVIDER_SITE_OTHER): Payer: Medicare Other

## 2023-11-25 DIAGNOSIS — I442 Atrioventricular block, complete: Secondary | ICD-10-CM

## 2023-11-27 LAB — CUP PACEART REMOTE DEVICE CHECK
Battery Remaining Longevity: 101 mo
Battery Remaining Percentage: 84 %
Battery Voltage: 3.01 V
Brady Statistic AP VP Percent: 1.1 %
Brady Statistic AP VS Percent: 1 %
Brady Statistic AS VP Percent: 98 %
Brady Statistic AS VS Percent: 1 %
Brady Statistic RA Percent Paced: 1.1 %
Brady Statistic RV Percent Paced: 99 %
Date Time Interrogation Session: 20250902020014
Implantable Lead Connection Status: 753985
Implantable Lead Connection Status: 753985
Implantable Lead Implant Date: 20231201
Implantable Lead Implant Date: 20231201
Implantable Lead Location: 753859
Implantable Lead Location: 753860
Implantable Pulse Generator Implant Date: 20231201
Lead Channel Impedance Value: 410 Ohm
Lead Channel Impedance Value: 640 Ohm
Lead Channel Pacing Threshold Amplitude: 0.75 V
Lead Channel Pacing Threshold Amplitude: 1.875 V
Lead Channel Pacing Threshold Pulse Width: 0.5 ms
Lead Channel Pacing Threshold Pulse Width: 0.5 ms
Lead Channel Sensing Intrinsic Amplitude: 12 mV
Lead Channel Sensing Intrinsic Amplitude: 5 mV
Lead Channel Setting Pacing Amplitude: 1 V
Lead Channel Setting Pacing Amplitude: 2.5 V
Lead Channel Setting Pacing Pulse Width: 0.5 ms
Lead Channel Setting Sensing Sensitivity: 4 mV
Pulse Gen Model: 2272
Pulse Gen Serial Number: 8111810

## 2023-11-28 ENCOUNTER — Ambulatory Visit: Payer: Self-pay | Admitting: Cardiology

## 2023-12-02 NOTE — Progress Notes (Signed)
 Remote PPM Transmission

## 2024-02-24 ENCOUNTER — Ambulatory Visit: Payer: Medicare Other

## 2024-02-24 DIAGNOSIS — I442 Atrioventricular block, complete: Secondary | ICD-10-CM | POA: Diagnosis not present

## 2024-02-25 LAB — CUP PACEART REMOTE DEVICE CHECK
Battery Remaining Longevity: 98 mo
Battery Remaining Percentage: 82 %
Battery Voltage: 3.01 V
Brady Statistic AP VP Percent: 1.2 %
Brady Statistic AP VS Percent: 1 %
Brady Statistic AS VP Percent: 98 %
Brady Statistic AS VS Percent: 1 %
Brady Statistic RA Percent Paced: 1.1 %
Brady Statistic RV Percent Paced: 99 %
Date Time Interrogation Session: 20251202020012
Implantable Lead Connection Status: 753985
Implantable Lead Connection Status: 753985
Implantable Lead Implant Date: 20231201
Implantable Lead Implant Date: 20231201
Implantable Lead Location: 753859
Implantable Lead Location: 753860
Implantable Pulse Generator Implant Date: 20231201
Lead Channel Impedance Value: 410 Ohm
Lead Channel Impedance Value: 610 Ohm
Lead Channel Pacing Threshold Amplitude: 0.75 V
Lead Channel Pacing Threshold Amplitude: 0.875 V
Lead Channel Pacing Threshold Pulse Width: 0.5 ms
Lead Channel Pacing Threshold Pulse Width: 0.5 ms
Lead Channel Sensing Intrinsic Amplitude: 12 mV
Lead Channel Sensing Intrinsic Amplitude: 5 mV
Lead Channel Setting Pacing Amplitude: 1 V
Lead Channel Setting Pacing Amplitude: 2.5 V
Lead Channel Setting Pacing Pulse Width: 0.5 ms
Lead Channel Setting Sensing Sensitivity: 4 mV
Pulse Gen Model: 2272
Pulse Gen Serial Number: 8111810

## 2024-02-27 ENCOUNTER — Ambulatory Visit: Payer: Self-pay | Admitting: Cardiology

## 2024-02-27 NOTE — Progress Notes (Signed)
 Remote PPM Transmission

## 2024-05-25 ENCOUNTER — Encounter

## 2024-08-24 ENCOUNTER — Encounter

## 2024-11-23 ENCOUNTER — Encounter

## 2025-02-22 ENCOUNTER — Encounter
